# Patient Record
Sex: Female | Born: 1940 | Race: Black or African American | Hispanic: No | Smoking: Never smoker
Health system: Southern US, Community
[De-identification: ages and names within clinical notes are randomized; demographics above are authoritative.]

## PROBLEM LIST (undated history)

## (undated) DIAGNOSIS — R0600 Dyspnea, unspecified: Secondary | ICD-10-CM

## (undated) DIAGNOSIS — B351 Tinea unguium: Secondary | ICD-10-CM

## (undated) DIAGNOSIS — M1990 Unspecified osteoarthritis, unspecified site: Secondary | ICD-10-CM

## (undated) DIAGNOSIS — I10 Essential (primary) hypertension: Secondary | ICD-10-CM

## (undated) DIAGNOSIS — E559 Vitamin D deficiency, unspecified: Secondary | ICD-10-CM

## (undated) DIAGNOSIS — M898X9 Other specified disorders of bone, unspecified site: Secondary | ICD-10-CM

## (undated) DIAGNOSIS — E669 Obesity, unspecified: Secondary | ICD-10-CM

## (undated) DIAGNOSIS — E119 Type 2 diabetes mellitus without complications: Secondary | ICD-10-CM

## (undated) DIAGNOSIS — E7800 Pure hypercholesterolemia, unspecified: Secondary | ICD-10-CM

## (undated) DIAGNOSIS — E785 Hyperlipidemia, unspecified: Secondary | ICD-10-CM

## (undated) DIAGNOSIS — S82891A Other fracture of right lower leg, initial encounter for closed fracture: Secondary | ICD-10-CM

## (undated) DIAGNOSIS — M1710 Unilateral primary osteoarthritis, unspecified knee: Secondary | ICD-10-CM

## (undated) DIAGNOSIS — M2040 Other hammer toe(s) (acquired), unspecified foot: Secondary | ICD-10-CM

## (undated) DIAGNOSIS — IMO0002 Reserved for concepts with insufficient information to code with codable children: Secondary | ICD-10-CM

## (undated) DIAGNOSIS — Z972 Presence of dental prosthetic device (complete) (partial): Secondary | ICD-10-CM

## (undated) DIAGNOSIS — I209 Angina pectoris, unspecified: Secondary | ICD-10-CM

## (undated) DIAGNOSIS — I509 Heart failure, unspecified: Secondary | ICD-10-CM

## (undated) DIAGNOSIS — N189 Chronic kidney disease, unspecified: Secondary | ICD-10-CM

## (undated) DIAGNOSIS — R7303 Prediabetes: Secondary | ICD-10-CM

## (undated) DIAGNOSIS — K649 Unspecified hemorrhoids: Secondary | ICD-10-CM

## (undated) DIAGNOSIS — K219 Gastro-esophageal reflux disease without esophagitis: Secondary | ICD-10-CM

## (undated) DIAGNOSIS — C801 Malignant (primary) neoplasm, unspecified: Secondary | ICD-10-CM

## (undated) DIAGNOSIS — I890 Lymphedema, not elsewhere classified: Secondary | ICD-10-CM

## (undated) HISTORY — DX: Other hammer toe(s) (acquired), unspecified foot: M20.40

## (undated) HISTORY — DX: Reserved for concepts with insufficient information to code with codable children: IMO0002

## (undated) HISTORY — PX: ENDOMETRIAL BIOPSY: SHX622

## (undated) HISTORY — DX: Other fracture of right lower leg, initial encounter for closed fracture: S82.891A

## (undated) HISTORY — DX: Unilateral primary osteoarthritis, unspecified knee: M17.10

## (undated) HISTORY — DX: Essential (primary) hypertension: I10

## (undated) HISTORY — PX: JOINT REPLACEMENT: SHX530

## (undated) HISTORY — DX: Pure hypercholesterolemia, unspecified: E78.00

## (undated) HISTORY — DX: Vitamin D deficiency, unspecified: E55.9

## (undated) HISTORY — DX: Hyperlipidemia, unspecified: E78.5

## (undated) HISTORY — DX: Type 2 diabetes mellitus without complications: E11.9

## (undated) HISTORY — PX: FOOT SURGERY: SHX648

## (undated) HISTORY — DX: Tinea unguium: B35.1

## (undated) HISTORY — DX: Obesity, unspecified: E66.9

## (undated) HISTORY — DX: Unspecified osteoarthritis, unspecified site: M19.90

## (undated) HISTORY — DX: Angina pectoris, unspecified: I20.9

## (undated) HISTORY — PX: BUNIONECTOMY: SHX129

## (undated) HISTORY — DX: Other specified disorders of bone, unspecified site: M89.8X9

## (undated) HISTORY — PX: EYE SURGERY: SHX253

## (undated) HISTORY — PX: TONSILLECTOMY: SUR1361

## (undated) HISTORY — PX: COLONOSCOPY: SHX174

## (undated) HISTORY — PX: BREAST LUMPECTOMY: SHX2

---

## 1988-07-01 DIAGNOSIS — C50919 Malignant neoplasm of unspecified site of unspecified female breast: Secondary | ICD-10-CM

## 1988-07-01 HISTORY — PX: BREAST SURGERY: SHX581

## 1988-07-01 HISTORY — DX: Malignant neoplasm of unspecified site of unspecified female breast: C50.919

## 1993-07-01 HISTORY — PX: BREAST LUMPECTOMY: SHX2

## 2000-02-07 ENCOUNTER — Ambulatory Visit (HOSPITAL_COMMUNITY): Admission: RE | Admit: 2000-02-07 | Discharge: 2000-02-07 | Payer: Self-pay | Admitting: *Deleted

## 2005-08-05 ENCOUNTER — Ambulatory Visit: Payer: Self-pay | Admitting: Obstetrics and Gynecology

## 2005-08-13 ENCOUNTER — Ambulatory Visit: Payer: Self-pay | Admitting: Unknown Physician Specialty

## 2006-02-12 ENCOUNTER — Ambulatory Visit: Payer: Self-pay | Admitting: Surgery

## 2006-07-04 ENCOUNTER — Ambulatory Visit: Payer: Self-pay | Admitting: Gastroenterology

## 2006-08-15 ENCOUNTER — Ambulatory Visit: Payer: Self-pay | Admitting: Unknown Physician Specialty

## 2007-02-27 ENCOUNTER — Ambulatory Visit: Payer: Self-pay | Admitting: Unknown Physician Specialty

## 2007-03-06 ENCOUNTER — Ambulatory Visit: Payer: Self-pay | Admitting: Unknown Physician Specialty

## 2007-08-17 ENCOUNTER — Ambulatory Visit: Payer: Self-pay | Admitting: Unknown Physician Specialty

## 2008-06-15 ENCOUNTER — Ambulatory Visit: Payer: Self-pay | Admitting: Internal Medicine

## 2008-07-01 DIAGNOSIS — S82891A Other fracture of right lower leg, initial encounter for closed fracture: Secondary | ICD-10-CM

## 2008-07-01 HISTORY — DX: Other fracture of right lower leg, initial encounter for closed fracture: S82.891A

## 2008-08-17 ENCOUNTER — Ambulatory Visit: Payer: Self-pay | Admitting: Unknown Physician Specialty

## 2008-12-20 ENCOUNTER — Ambulatory Visit: Payer: Self-pay | Admitting: Unknown Physician Specialty

## 2009-09-01 ENCOUNTER — Ambulatory Visit: Payer: Self-pay | Admitting: Unknown Physician Specialty

## 2010-09-04 ENCOUNTER — Ambulatory Visit: Payer: Self-pay | Admitting: Unknown Physician Specialty

## 2011-10-03 ENCOUNTER — Ambulatory Visit: Payer: Self-pay | Admitting: Unknown Physician Specialty

## 2011-10-23 ENCOUNTER — Ambulatory Visit: Payer: Self-pay | Admitting: Gastroenterology

## 2012-09-17 ENCOUNTER — Ambulatory Visit: Payer: Self-pay | Admitting: Unknown Physician Specialty

## 2012-10-07 ENCOUNTER — Ambulatory Visit: Payer: Self-pay | Admitting: Unknown Physician Specialty

## 2013-12-09 DIAGNOSIS — E559 Vitamin D deficiency, unspecified: Secondary | ICD-10-CM | POA: Insufficient documentation

## 2013-12-12 DIAGNOSIS — M1990 Unspecified osteoarthritis, unspecified site: Secondary | ICD-10-CM | POA: Insufficient documentation

## 2014-08-25 ENCOUNTER — Ambulatory Visit: Payer: Self-pay | Admitting: Internal Medicine

## 2015-02-01 ENCOUNTER — Other Ambulatory Visit: Payer: Self-pay | Admitting: Internal Medicine

## 2015-02-01 DIAGNOSIS — R32 Unspecified urinary incontinence: Secondary | ICD-10-CM | POA: Insufficient documentation

## 2015-02-01 DIAGNOSIS — Z1239 Encounter for other screening for malignant neoplasm of breast: Secondary | ICD-10-CM

## 2015-02-04 DIAGNOSIS — E119 Type 2 diabetes mellitus without complications: Secondary | ICD-10-CM | POA: Insufficient documentation

## 2015-02-04 DIAGNOSIS — N183 Chronic kidney disease, stage 3 unspecified: Secondary | ICD-10-CM | POA: Insufficient documentation

## 2015-02-04 DIAGNOSIS — Z1239 Encounter for other screening for malignant neoplasm of breast: Secondary | ICD-10-CM | POA: Insufficient documentation

## 2015-05-01 DIAGNOSIS — M159 Polyosteoarthritis, unspecified: Secondary | ICD-10-CM | POA: Insufficient documentation

## 2015-05-01 DIAGNOSIS — K219 Gastro-esophageal reflux disease without esophagitis: Secondary | ICD-10-CM | POA: Insufficient documentation

## 2015-12-09 DIAGNOSIS — R0609 Other forms of dyspnea: Secondary | ICD-10-CM | POA: Insufficient documentation

## 2016-02-18 DIAGNOSIS — M25472 Effusion, left ankle: Secondary | ICD-10-CM | POA: Insufficient documentation

## 2016-02-18 DIAGNOSIS — K649 Unspecified hemorrhoids: Secondary | ICD-10-CM | POA: Insufficient documentation

## 2016-10-17 ENCOUNTER — Other Ambulatory Visit: Payer: Self-pay | Admitting: Internal Medicine

## 2016-10-17 DIAGNOSIS — N183 Chronic kidney disease, stage 3 unspecified: Secondary | ICD-10-CM

## 2016-10-28 ENCOUNTER — Ambulatory Visit
Admission: RE | Admit: 2016-10-28 | Discharge: 2016-10-28 | Disposition: A | Discharge status: Home / Self Care | Payer: Medicare Other | Source: Ambulatory Visit | Attending: Internal Medicine | Admitting: Internal Medicine

## 2016-10-28 DIAGNOSIS — N261 Atrophy of kidney (terminal): Secondary | ICD-10-CM | POA: Diagnosis not present

## 2016-10-28 DIAGNOSIS — N183 Chronic kidney disease, stage 3 unspecified: Secondary | ICD-10-CM

## 2016-10-31 DIAGNOSIS — I5032 Chronic diastolic (congestive) heart failure: Secondary | ICD-10-CM | POA: Insufficient documentation

## 2017-01-20 ENCOUNTER — Encounter: Payer: Self-pay | Admitting: Occupational Therapy

## 2017-01-20 ENCOUNTER — Ambulatory Visit: Payer: Medicare Other | Attending: Cardiology | Admitting: Occupational Therapy

## 2017-01-20 DIAGNOSIS — I890 Lymphedema, not elsewhere classified: Secondary | ICD-10-CM | POA: Insufficient documentation

## 2017-01-20 NOTE — Patient Instructions (Signed)

## 2017-01-21 NOTE — Therapy (Signed)
[place] MAIN [facility] SERVICES [address] [place], [place], [address] Phone: [phone]   Fax:  [phone]  Occupational Therapy Evaluation  Patient Details  Name: [name] MRN: [id number] Date of Birth: 05/14/1941 Referring Provider: Isaias Cowman, MD  Encounter Date: 01/20/2017      OT End of Session - 01/20/17 1654    Visit Number 1   Number of Visits 36   Date for OT Re-Evaluation 04/20/17   OT Start Time 1107   OT Stop Time 1215   OT Time Calculation (min) 68 min      History reviewed. No pertinent past medical history.  History reviewed. No pertinent surgical history.  There were no vitals filed for this visit.      Subjective Assessment - 01/21/17 1451    Subjective  Pt is a  pleasant, 76 yo female referred to Occupational Therapy by Isaias Cowman, MD for lymphedema (LE) evaluation and treatment. Pt reports exacerbation of chronic, BLE leg swelling after a fall 3 weeks ago resulting in  trauma to her L knee. Pt reports sudden onset of swelling in approximately April of this year. Upon assessment , however, swelling appears to be longstanding. Pt denies prior LE therepay and does not currently wear elastic compression stockings. Pt endorses a hx of open, weeping leg wounds periodically. She denies hx of cellulitis.   Pertinent History HTN, diet controlled type 2 diabetes, stage III, chronic kidney disease, hx urinary incontinence, GERD, OA ( s/p intra articular injections)   Limitations chronic leag pain and swelling, difficulty walking, impaired functional mobility and transfers, difficulty fitting street shoes and LB clothing, unable to reach feet to inspect skin, perform skin and nail care, and to isnspect skin; decreased activity tolerance, including IADLs and social activities, requiring standing, walking, and / or prolonged sitting, fall risk, increased infection risk ( cellulitis)   Patient Stated Goals  reduce swelling  so I can move better and do more   Currently in Pain? Yes   Pain Score 7    Pain Location Leg   Pain Orientation Right;Left   Pain Descriptors / Indicators Aching;Tender;Throbbing;Heaviness;Tightness;Tiring;Sore;Discomfort;Dull   Pain Type Chronic pain   Pain Frequency Intermittent   Aggravating Factors  standing, walking, prolonged sitting, weight bearing, transfers, bed mobility   Pain Relieving Factors elevation. compression rubbing           [facility] OT Assessment - 01/21/17 0001      Assessment   Diagnosis BLE, mild, stage 2 secondary lymphedema exacerbated by systemic fluid retention, (CKD and HTN) dependent positioning, orthopedic trauma ( falls) and chronic joint inflamation ( OA knees)    Referring Provider Isaias Cowman, MD   Onset Date 09/29/16   Prior Therapy no prior CDT; no compression garments     Precautions   Precautions Other (comment)  LE skin precautions     Balance Screen   Has the patient fallen in the past 6 months Yes   How many times? unknown     Home  Environment   Family/patient expects to be discharged to: Private residence   Living Arrangements Alone   Available Help at Discharge Available PRN/intermittently   Type of Home Other (Comment)  older home.2 steps to enter,no handrails grab bars tub seat     Prior Function   Level of Independence Independent   Vocation --  part time Scientist, research (physical sciences)   Vocation Requirements long distance driving [employer] [place]   Leisure  church     Activity Tolerance   Activity Tolerance Comments activity tolerance not limited by endurance. AT limited increased leg pain and swelling w/ prolonged sitting, standing and walking     Cognition   Behaviors Other (comment)  flat affect. holds head in hands intermittently thruout eval     ROM / Strength   AROM / PROM / Strength AROM;Strength  WFL                  OT Treatments/Exercises (OP) - 01/21/17 0001       Transfers   Transfers Sit to Stand;Stand to Sit   Sit to Stand With upper extremity assist;With armrests;6: Modified independent (Device/Increase time)   Stand to Sit 7: Independent     ADLs   Overall ADLs see Limitations  in SUBJECTIVE. BLE swelling and associated pain limiits ability to fit shoes and LB clothing.  difficulty reaching limits ability to  bathing feet and distal legs, to inspect skin, to perform skin and nail care. All basic and instrumental ADLs requiring prolonged standing, walking or sitting are impaired by BLE swelling and pain.    Functional Mobility transfers limited by BLE LE   ADL Education Given Yes     Manual Therapy   Manual Therapy Edema management   Edema Management Comparative BLE limb volumetrics TBA at initial CDT Rx visit. By viisual assessment estimate 15% limb volume differential below the knees, L>R       Skin Description Hyper-Keratosis [place] Shiny Tight Fibrotic Fatty Doughy    distended pores L>L  x x X above knees X above knees   Hydration Dry Flaky Erythema Other   x x     Color Redness Present Pallor Blanching Hemosiderin Staining Other   x    Darkened skin distal legs L>R   Odor Malodorous Yeast Present Absent      x   Temperature Warm Cool Normal     x   Pitting Edema 1+ 2+ 3+ 4+ >4       x   Girth Symmetrical Asymmetrical Other Distribution    L>R Pronounced swelling and palpable warmth at knee joints   Stemmer Sign Positive Negative   x    Lymphorrea History Of: (When) Present Absent    X L anterior distal leg    Wounds History Of (when) Present Absent Venous Arterial Pressure Size   S/p fall 3 weeks ago- L anterior distal leg Multiple LLE bruises on knee and distally        Signs of Infection Redness Warmth Erythema Acute Swelling Drainage          Scars Fibrosis Hypersensitivity   Fatty fibrosis B medial and lateral  malleoli     Sensation Light Touch Deep pressure Hypersensitivty   Present Absent Present  Absent Present Absent   TBA         Nails WNL Fungus Present Other    x    Hair Growth Symmetrical Asymmetrical   x              OT Education - 01/20/17 1653    Education provided Yes   Education Details Provided Pt/caregiver skilled education and ADL training throughout visit for lymphedema etiology, progression, and treatment including Intensive and Management Phase Complete Decongestive Therapy (CDT)  Discussed lymphedema precautions, cellulitis risk, and all CDT and LE self-care components, including compression wrapping/ garments & devices, lymphatic pumping ther ex, simple self-MLD, and skin care. Provided printed Lymphedema Workbook  for reference.   Person(s) Educated Patient   Methods Explanation;Demonstration;Verbal cues   Comprehension Verbalized understanding;Returned demonstration;Verbal cues required;Tactile cues required             OT Long Term Goals - 01/21/17 1721      OT LONG TERM GOAL #1   Title Pt independent w/ lymphedema precautions and prevention principals and strategies to limit LE progression and infection risk.   Baseline dependent   Time 2   Period Weeks   Status New   Target Date --  2 weeks s/p initial Rx visit     OT LONG TERM GOAL #2   Title Lymphedema (LE) management/ self-care: Pt able to apply knee length, multi layered, gradient compression wraps with modified independence ( extra time and modified positioning)  using proper techniques within 2 weeks to achieve optimal limb volume reductions bilaterally.   Baseline dependent   Time 2   Period Weeks   Status New   Target Date --  2 weeks s/p initial Rx visit     OT LONG TERM GOAL #3   Title Lymphedema (LE) management/ self-care:  Pt to achieve at least 10% limb volume reduction  on L below the knee and 5 % reduction on R below the knee during Intensive Phase CDT to limit progression, to reduce leg pain, to improve ADLs performance, and to facilitate safe functional mobility and  ambulation.   Baseline dependent   Time 12   Period Weeks   Status New   Target Date 04/20/17     OT LONG TERM GOAL #4   Title LE management/ self care: Pt able to don and doff appropriate  BLE compression garments using assistive devices within 1 week of issue date with modified independence.   Baseline dependent   Time 12   Period Weeks   Status New   Target Date 04/20/17     OT LONG TERM GOAL #5   Title Lymphedema (LE) management/ self-care: During Management Phase CDT Pt to sremain infection free and ustain current limb volumes within 5%, and all other clinical gains achieved during OT treatment with needed level of caregiver assistance to limit LE progression, infection risk and further functional decline.   Baseline dependent   Time 6   Period Months   Status New   Target Date 07/19/17               Plan - 01/21/17 1731    Occupational performance deficits (Please refer to evaluation for details): ADL's;IADL's;Rest and Sleep;Work;Leisure;Social Participation;Other  body image   Rehab Potential Good   OT Frequency 3x / week   OT Duration 12 weeks   OT Treatment/Interventions Self-care/ADL training;Therapeutic exercise;Patient/family education;Manual Therapy;Manual lymph drainage;Other (comment);DME and/or AE instruction;Compression bandaging;Therapeutic activities  skin care w/ low ph castor oil and/ or Eucerin lotion   Clinical Decision Making Several treatment options, min-mod task modification necessary   Recommended Other Services Fit with appropriate BLE, knee length compression garments or alternative, adjustable daytime wrap garments for ease of donning and doffing. Fit w/ BLE, low profile, HOS devices to improve lymphatic circulation and decrease fibrosis formation during HOS   Consulted and Agree with Plan of Care Patient      Patient will benefit from skilled therapeutic intervention in order to improve the following deficits and impairments:  Abnormal  gait, Decreased skin integrity, Decreased knowledge of precautions, Decreased activity tolerance, Decreased knowledge of use of DME, Impaired flexibility, Decreased balance, Decreased mobility, Difficulty walking, Increased edema,  Pain  Visit Diagnosis: Lymphedema, not elsewhere classified - Plan: Ot plan of care cert/re-cert      G-Codes - 00/71/21 1655    Functional Assessment Tool Used (Outpatient only) Observation, physical examination, interview, medical chart review, compalitve limb volumetrics   Functional Limitation Self care   Self Care Current Status (F7588) At least 80 percent but less than 100 percent impaired, limited or restricted   Self Care Goal Status (T2549) At least 1 percent but less than 20 percent impaired, limited or restricted      Problem List There are no active problems to display for this patient.   Ansel Bong 01/21/2017, 6:00 PM  [place] MAIN [facility] SERVICES [address] [place], [place], [address] Phone: [phone]   Fax:  [phone]  Name: [name] MRN: [id number] Date of Birth: 1941-04-18

## 2017-01-29 ENCOUNTER — Ambulatory Visit
Admission: RE | Admit: 2017-01-29 | Discharge: 2017-01-29 | Disposition: A | Discharge status: Home / Self Care | Payer: Medicare Other | Source: Ambulatory Visit | Attending: Internal Medicine | Admitting: Internal Medicine

## 2017-01-29 ENCOUNTER — Other Ambulatory Visit: Payer: Self-pay | Admitting: Internal Medicine

## 2017-01-29 DIAGNOSIS — M7989 Other specified soft tissue disorders: Secondary | ICD-10-CM | POA: Diagnosis not present

## 2017-02-11 ENCOUNTER — Ambulatory Visit: Payer: Medicare Other | Attending: Cardiology | Admitting: Occupational Therapy

## 2017-02-11 DIAGNOSIS — I890 Lymphedema, not elsewhere classified: Secondary | ICD-10-CM

## 2017-02-11 NOTE — Therapy (Signed)
[place] MAIN [facility] SERVICES [address] [place], [place], [address] Phone: [phone]   Fax:  [phone]  Occupational Therapy Treatment  Patient Details  Name: [name] MRN: [id number] Date of Birth: 1940-11-26 Referring Provider: Isaias Cowman, MD  Encounter Date: 02/11/2017      OT End of Session - 02/11/17 1726    Visit Number 2   Number of Visits 36   Date for OT Re-Evaluation 04/20/17   OT Start Time 0235   OT Stop Time 0335   OT Time Calculation (min) 60 min   Activity Tolerance Patient tolerated treatment well;Other (comment);Patient limited by pain  limited by decreased bed mobility, difficulty walking, and impaired transfers.   Behavior During Therapy [facility] for tasks assessed/performed      No past medical history on file.  No past surgical history on file.  There were no vitals filed for this visit.      Subjective Assessment - 02/11/17 1716    Subjective  Pt presents for OT visit 2/36 to address BLE Lymphedema, L>R. Pt brings new lace up shoes to session as requested.    Pertinent History HTN, diet controlled type 2 diabetes, stage III, chronic kidney disease, hx urinary incontinence, GERD, OA ( s/p intra articular injections)   Limitations chronic leag pain and swelling, difficulty walking, impaired functional mobility and transfers, difficulty fitting street shoes and LB clothing, unable to reach feet to inspect skin, perform skin and nail care, and to isnspect skin; decreased activity tolerance, including IADLs and social activities, requiring standing, walking, and / or prolonged sitting, fall risk, increased infection risk ( cellulitis)   Patient Stated Goals reduce swelling  so I can move better and do more   Currently in Pain? Yes  Leg pain unchanged since IE. not rated numerically today             LYMPHEDEMA/ONCOLOGY QUESTIONNAIRE - 02/11/17 1720      Lymphedema Assessments   Lymphedema  Assessments Lower extremities     Right Lower Extremity Lymphedema   Other RLE belw knee volume (A-D) = 4484.22 ml. Knee to groin (E-G) limb volume = 6966.07 %. Ankle to grouin, or full leg (A-G) volume = 11450.29 ml     Left Lower Extremity Lymphedema   Other LLE belw knee volume (A-D) = 5713.55 ml. Knee to groin (E-G) limb volume = 6865.03%. Ankle to grouin, or full leg (A-G) volume = 12578.59 ml   Other Limb volume differential at the A-D segement measures 21.52% , L>R. LVD at thigh segemnt ( E-G) measures -1.47%, R>L. LVD  for full leg (A-G) measures 8.97%, L>R.                 OT Treatments/Exercises (OP) - 02/11/17 0001      ADLs   ADL Education Given Yes     Manual Therapy   Manual Therapy Edema management;Compression Bandaging   Manual therapy comments completed BLE comparative limb volumetrics for baseline today   Compression Bandaging Ankle to below Knee (A-D)  LLE gradient compression wraps applied from foot to below knee as follows: Toe wrap omitted as not indicated. . Knee length cotton stockinett from base of toes to knee. One roll Rasidol soft foam. cumferentially from foot to tibila tuberosity w/ ~ 50% overlap; single 8 cm x 5 m  to foot and ankle, one 12 cm  x 5 m ankle to achilles origin...all applied circumferentially in custommary layered gradient configuration. Fitted w/  3 x non-slip sock as Pt able to fit shoe over wraps.                 OT Education - 02/11/17 1718    Education provided Yes   Education Details Manual lymph drainage (MLD) to LLE in supine utilizing functional inguinal lymph nodes and deep abdominal lymphatics as is customary for non-cancer related lower extremity LE, including bilateral "short neck" sequence, J strokes to sub and supraclavicular LN, deep abdominal pathways, functional inguinal LN, lower extremity proximal to distal w/ emphasis on medial knee bottleneck and politeal LN. Performed fibrosis technique to B maleoli and  distal  legs to address fatty fibrosis.   Person(s) Educated Patient   Methods Explanation;Demonstration   Comprehension Verbalized understanding;Need further instruction             OT Long Term Goals - 01/21/17 1721      OT LONG TERM GOAL #1   Title Pt independent w/ lymphedema precautions and prevention principals and strategies to limit LE progression and infection risk.   Baseline dependent   Time 2   Period Weeks   Status New   Target Date --  2 weeks s/p initial Rx visit     OT LONG TERM GOAL #2   Title Lymphedema (LE) management/ self-care: Pt able to apply knee length, multi layered, gradient compression wraps with modified independence ( extra time and modified positioning)  using proper techniques within 2 weeks to achieve optimal limb volume reductions bilaterally.   Baseline dependent   Time 2   Period Weeks   Status New   Target Date --  2 weeks s/p initial Rx visit     OT LONG TERM GOAL #3   Title Lymphedema (LE) management/ self-care:  Pt to achieve at least 10% limb volume reduction  on L below the knee and 5 % reduction on R below the knee during Intensive Phase CDT to limit progression, to reduce leg pain, to improve ADLs performance, and to facilitate safe functional mobility and ambulation.   Baseline dependent   Time 12   Period Weeks   Status New   Target Date 04/20/17     OT LONG TERM GOAL #4   Title LE management/ self care: Pt able to don and doff appropriate  BLE compression garments using assistive devices within 1 week of issue date with modified independence.   Baseline dependent   Time 12   Period Weeks   Status New   Target Date 04/20/17     OT LONG TERM GOAL #5   Title Lymphedema (LE) management/ self-care: During Management Phase CDT Pt to sremain infection free and ustain current limb volumes within 5%, and all other clinical gains achieved during OT treatment with needed level of caregiver assistance to limit LE progression,  infection risk and further functional decline.   Baseline dependent   Time 6   Period Months   Status New   Target Date 07/19/17               Plan - 02/11/17 1728    Clinical Impression Statement BLE comparative limb volumetrics taken for starting baseline reveal Limb volume differential at the A-D ( below the knee ) segements measures 21.52% , L>R. LVD at thigh segemnt ( E-G) measures -1.47%, R>L. LVD  for full leg (A-G) measures 8.97%, L>R.. Thus, these values demonstrate distribution of swelling is primarily below the knee on the L. The LVD at the thigh is most  likely 2/2 dR side dominance.  Pt  tolerated compression wrapping from toes to knee and was ableto don shoe after session using long handled shoe horn.  Pt educated for Lymphatic pumping ther ex, but did not return demonstration. Cont as per POC.   Occupational performance deficits (Please refer to evaluation for details): ADL's;IADL's;Rest and Sleep;Work;Play;Leisure;Social Participation   Rehab Potential Good   OT Frequency 3x / week   OT Duration 12 weeks   OT Treatment/Interventions Self-care/ADL training;Therapeutic exercise;Patient/family education;Manual Therapy;Manual lymph drainage;Other (comment);DME and/or AE instruction;Compression bandaging;Therapeutic activities  skin care w/ low ph castor oil and/ or Eucerin lotion   Consulted and Agree with Plan of Care Patient      Patient will benefit from skilled therapeutic intervention in order to improve the following deficits and impairments:  Abnormal gait, Decreased skin integrity, Decreased knowledge of precautions, Decreased activity tolerance, Decreased knowledge of use of DME, Impaired flexibility, Decreased balance, Decreased mobility, Difficulty walking, Increased edema, Pain  Visit Diagnosis: Lymphedema, not elsewhere classified    Problem List There are no active problems to display for this patient.   Andrey Spearman, MS, OTR/L, [facility] 02/11/17  5:35 PM  [place] MAIN [facility] SERVICES [address] [place], [place], [address] Phone: [phone]   Fax:  [phone]  Name: LORRY FURBER MRN: [id number] Date of Birth: [birth year]

## 2017-02-13 ENCOUNTER — Ambulatory Visit: Payer: Medicare Other | Admitting: Occupational Therapy

## 2017-02-17 ENCOUNTER — Ambulatory Visit: Payer: Medicare Other | Admitting: Occupational Therapy

## 2017-02-25 ENCOUNTER — Ambulatory Visit: Payer: Medicare Other | Admitting: Occupational Therapy

## 2017-02-25 DIAGNOSIS — I890 Lymphedema, not elsewhere classified: Secondary | ICD-10-CM | POA: Diagnosis not present

## 2017-02-25 NOTE — Therapy (Signed)
[place] MAIN [facility] SERVICES [address] [place], [place], [address] Phone: [phone]   Fax:  [phone]  Occupational Therapy Treatment  Patient Details  Name: [name] MRN: [id number] Date of Birth: 1941-01-30 Referring Provider: Isaias Cowman, MD  Encounter Date: 02/25/2017      OT End of Session - 02/25/17 1703    Visit Number 3   Number of Visits 36   Date for OT Re-Evaluation 04/20/17   OT Start Time 0304   OT Stop Time 0405   OT Time Calculation (min) 61 min   Activity Tolerance Patient tolerated treatment well;Other (comment);Patient limited by pain  limited by decreased bed mobility, difficulty walking, and impaired transfers.   Behavior During Therapy [facility] for tasks assessed/performed      No past medical history on file.  No past surgical history on file.  There were no vitals filed for this visit.      Subjective Assessment - 02/25/17 1655    Subjective  Pt presents for OT visit 3/36 to address BLE Lymphedema, L>R. Pt was last seen on 7/23 to commence CDT, but has missed last 2 appointments due to time confusion and tardiness. Pt verbalized understanding today that without consistent and regular attendane at therapy sessions prognosis for progress towards goals is poor. Pt states that she tryed applying compression wraps to LLE onne time since last visit, and she had friends apply them as well.    Pertinent History HTN, diet controlled type 2 diabetes, stage III, chronic kidney disease, hx urinary incontinence, GERD, OA ( s/p intra articular injections)   Limitations chronic leag pain and swelling, difficulty walking, impaired functional mobility and transfers, difficulty fitting street shoes and LB clothing, unable to reach feet to inspect skin, perform skin and nail care, and to isnspect skin; decreased activity tolerance, including IADLs and social activities, requiring standing, walking, and / or prolonged  sitting, fall risk, increased infection risk ( cellulitis)   Patient Stated Goals reduce swelling  so I can move better and do more   Currently in Pain? No/denies                      OT Treatments/Exercises (OP) - 02/25/17 0001      ADLs   ADL Education Given Yes     Manual Therapy   Manual Therapy Edema management;Compression Bandaging;Manual Lymphatic Drainage (MLD)   Manual Lymphatic Drainage (MLD) Manual lymph drainage (MLD) to LLE in supine utilizing functional inguinal lymph nodes and deep abdominal lymphatics as is customary for non-cancer related lower extremity LE, including bilateral "short neck" sequence, J strokes to sub and supraclavicular LN, deep abdominal pathways, functional inguinal LN, lower extremity proximal to distal w/ emphasis on medial knee bottleneck and politeal LN. Performed fibrosis technique to B maleoli and distal  legs to address fatty fibrosis.   Compression Bandaging Ankle to below Knee (A-D)  LLE gradient compression wraps applied from foot to below knee as follows: Toe wrap omitted as not indicated. . Knee length cotton stockinett from base of toes to knee. One roll Rasidol soft foam. cumferentially from foot to tibila tuberosity w/ ~ 50% overlap; single 8 cm x 5 m  to foot and ankle, one 12 cm  x 5 m ankle to achilles origin...all applied circumferentially in custommary layered gradient configuration. Fitted w/ 3 x non-slip sock as Pt able to fit shoe over wraps.  OT Education - 02/25/17 1657    Education provided Yes   Education Details Continued skilled Pt/caregiver education  And LE ADL training throughout visit for lymphedema self care/ home program, including compression wrapping, compression garment and device wear/care, lymphatic pumping ther ex, simple self-MLD, and skin care. Discussed progress towards goals.   Person(s) Educated Patient   Methods Explanation;Demonstration   Comprehension Verbalized  understanding             OT Long Term Goals - 01/21/17 1721      OT LONG TERM GOAL #1   Title Pt independent w/ lymphedema precautions and prevention principals and strategies to limit LE progression and infection risk.   Baseline dependent   Time 2   Period Weeks   Status New   Target Date --  2 weeks s/p initial Rx visit     OT LONG TERM GOAL #2   Title Lymphedema (LE) management/ self-care: Pt able to apply knee length, multi layered, gradient compression wraps with modified independence ( extra time and modified positioning)  using proper techniques within 2 weeks to achieve optimal limb volume reductions bilaterally.   Baseline dependent   Time 2   Period Weeks   Status New   Target Date --  2 weeks s/p initial Rx visit     OT LONG TERM GOAL #3   Title Lymphedema (LE) management/ self-care:  Pt to achieve at least 10% limb volume reduction  on L below the knee and 5 % reduction on R below the knee during Intensive Phase CDT to limit progression, to reduce leg pain, to improve ADLs performance, and to facilitate safe functional mobility and ambulation.   Baseline dependent   Time 12   Period Weeks   Status New   Target Date 04/20/17     OT LONG TERM GOAL #4   Title LE management/ self care: Pt able to don and doff appropriate  BLE compression garments using assistive devices within 1 week of issue date with modified independence.   Baseline dependent   Time 12   Period Weeks   Status New   Target Date 04/20/17     OT LONG TERM GOAL #5   Title Lymphedema (LE) management/ self-care: During Management Phase CDT Pt to sremain infection free and ustain current limb volumes within 5%, and all other clinical gains achieved during OT treatment with needed level of caregiver assistance to limit LE progression, infection risk and further functional decline.   Baseline dependent   Time 6   Period Months   Status New   Target Date 07/19/17               Plan -  02/25/17 1700    Clinical Impression Statement Condition of R and L legs are unchanged today regarding limb volume and skin condition since last visit on 7/23. Pt attempted to apply wraps one since last seen, and friend applied them 1 x. Pt has missed last 2 of 4 vists so progress towards goals is nil. Encouraged Pt to attend  OT at scheeduled frequency for optimal outcome for LE management.   Occupational performance deficits (Please refer to evaluation for details): ADL's;IADL's;Rest and Sleep;Work;Leisure;Social Participation   Rehab Potential Good   OT Frequency 3x / week   OT Duration 12 weeks   OT Treatment/Interventions Self-care/ADL training;Therapeutic exercise;Patient/family education;Manual Therapy;Manual lymph drainage;Other (comment);DME and/or AE instruction;Compression bandaging;Therapeutic activities  skin care w/ low ph castor oil and/ or Eucerin lotion   Consulted  and Agree with Plan of Care Patient      Patient will benefit from skilled therapeutic intervention in order to improve the following deficits and impairments:  Abnormal gait, Decreased skin integrity, Decreased knowledge of precautions, Decreased activity tolerance, Decreased knowledge of use of DME, Impaired flexibility, Decreased balance, Decreased mobility, Difficulty walking, Increased edema, Pain  Visit Diagnosis: Lymphedema, not elsewhere classified    Problem List There are no active problems to display for this patient.  Andrey Spearman, MS, OTR/L, [facility] 02/25/17 5:04 PM   [place] MAIN [facility] SERVICES [address] [place], [place], [address] Phone: [phone]   Fax:  [phone]  Name: [name] MRN: [id number] Date of Birth: 1940-11-20

## 2017-02-27 ENCOUNTER — Ambulatory Visit: Payer: Medicare Other | Admitting: Occupational Therapy

## 2017-02-27 DIAGNOSIS — I890 Lymphedema, not elsewhere classified: Secondary | ICD-10-CM

## 2017-02-27 NOTE — Therapy (Signed)
[place] MAIN [facility] SERVICES [address] [place], [place], [address] Phone: [phone]   Fax:  [phone]  Occupational Therapy Treatment  Patient Details  Name: [name] MRN: [id number] Date of Birth: [birth year] Referring Provider: Isaias Cowman, MD  Encounter Date: 02/27/2017      OT End of Session - 02/27/17 1638    Visit Number 4   Number of Visits 36   Date for OT Re-Evaluation 04/20/17   OT Start Time 0205   OT Stop Time 0305   OT Time Calculation (min) 60 min   Activity Tolerance Patient tolerated treatment well;Other (comment);No increased pain  limited by decreased bed mobility, difficulty walking, and impaired transfers.   Behavior During Therapy [facility] for tasks assessed/performed      No past medical history on file.  No past surgical history on file.  There were no vitals filed for this visit.      Subjective Assessment - 02/27/17 1634    Subjective  Pt presents for OT visit 4/36 to address BLE Lymphedema, commencing with Intensive Pgase CDT to LLE first. Pt brings wraps to clinic in a bag. She reports she tolerated them without difficulty after last session after removing outermost wrap as they felt too tigght. She did not notice volume reduction with knee wrapped.   Pertinent History HTN, diet controlled type 2 diabetes, stage III, chronic kidney disease, hx urinary incontinence, GERD, OA ( s/p intra articular injections)   Limitations chronic leag pain and swelling, difficulty walking, impaired functional mobility and transfers, difficulty fitting street shoes and LB clothing, unable to reach feet to inspect skin, perform skin and nail care, and to isnspect skin; decreased activity tolerance, including IADLs and social activities, requiring standing, walking, and / or prolonged sitting, fall risk, increased infection risk ( cellulitis)   Patient Stated Goals reduce swelling  so I can move better and do more    Currently in Pain? No/denies                      OT Treatments/Exercises (OP) - 02/27/17 0001      ADLs   ADL Education Given Yes     Manual Therapy   Manual Therapy Edema management;Compression Bandaging;Manual Lymphatic Drainage (MLD)   Manual therapy comments skin care to LLE w/ low pH castor oil during MLD. Telfa  placed over openings on skin on anterior L Leg   Manual Lymphatic Drainage (MLD) Manual lymph drainage (MLD) to LLE in supine utilizing functional inguinal lymph nodes and deep abdominal lymphatics as is customary for non-cancer related lower extremity LE, including bilateral "short neck" sequence, J strokes to sub and supraclavicular LN, deep abdominal pathways, functional inguinal LN, lower extremity proximal to distal w/ emphasis on medial knee bottleneck and politeal LN. Performed fibrosis technique to B maleoli and distal  legs to address fatty fibrosis.   Compression Bandaging Ankle to below Knee (A-D)  LLE gradient compression wraps applied from foot to below knee as follows: Toe wrap omitted as not indicated. . Knee length cotton stockinett from base of toes to knee. One roll Rasidol soft foam. cumferentially from foot to tibila tuberosity w/ ~ 50% overlap; single 8 cm x 5 m  to foot and ankle, one 12 cm  x 5 m ankle to achilles origin...all applied circumferentially in custommary layered gradient configuration. Fitted w/ 3 x non-slip sock as Pt able to fit shoe over wraps.  OT Education - 02/27/17 1634    Education provided Yes   Education Details Continued skilled Pt/caregiver education  And LE ADL training throughout visit for lymphedema self care/ home program, including compression wrapping, compression garment and device wear/care, lymphatic pumping ther ex, simple self-MLD, and skin care. Discussed progress towards goals.   Person(s) Educated Patient   Methods Explanation;Demonstration   Comprehension Verbalized  understanding;Need further instruction             OT Long Term Goals - 01/21/17 1721      OT LONG TERM GOAL #1   Title Pt independent w/ lymphedema precautions and prevention principals and strategies to limit LE progression and infection risk.   Baseline dependent   Time 2   Period Weeks   Status New   Target Date --  2 weeks s/p initial Rx visit     OT LONG TERM GOAL #2   Title Lymphedema (LE) management/ self-care: Pt able to apply knee length, multi layered, gradient compression wraps with modified independence ( extra time and modified positioning)  using proper techniques within 2 weeks to achieve optimal limb volume reductions bilaterally.   Baseline dependent   Time 2   Period Weeks   Status New   Target Date --  2 weeks s/p initial Rx visit     OT LONG TERM GOAL #3   Title Lymphedema (LE) management/ self-care:  Pt to achieve at least 10% limb volume reduction  on L below the knee and 5 % reduction on R below the knee during Intensive Phase CDT to limit progression, to reduce leg pain, to improve ADLs performance, and to facilitate safe functional mobility and ambulation.   Baseline dependent   Time 12   Period Weeks   Status New   Target Date 04/20/17     OT LONG TERM GOAL #4   Title LE management/ self care: Pt able to don and doff appropriate  BLE compression garments using assistive devices within 1 week of issue date with modified independence.   Baseline dependent   Time 12   Period Weeks   Status New   Target Date 04/20/17     OT LONG TERM GOAL #5   Title Lymphedema (LE) management/ self-care: During Management Phase CDT Pt to sremain infection free and ustain current limb volumes within 5%, and all other clinical gains achieved during OT treatment with needed level of caregiver assistance to limit LE progression, infection risk and further functional decline.   Baseline dependent   Time 6   Period Months   Status New   Target Date 07/19/17                Plan - 02/27/17 1638    Clinical Impression Statement No visible or palpable change in limb volume below the knee for LLE today, although L knee does appear less swollen since last visit. Pt is building tolerance to compression wraps, but thus far she has not be reapplying them during visit intervals. Stressed the importance of this for progress towards goals again today. Pt tolerated LLE MLD, skin care and knee lenth compression wrapping without difficulty in clinic. Lymphorrhea is absent today. Cont as per POC.   Occupational performance deficits (Please refer to evaluation for details): ADL's;IADL's;Rest and Sleep;Work;Leisure;Social Participation;Other  nody image   Rehab Potential Good   OT Frequency 3x / week   OT Duration 12 weeks   OT Treatment/Interventions Self-care/ADL training;Therapeutic exercise;Patient/family education;Manual Therapy;Manual lymph drainage;Other (comment);DME and/or AE  instruction;Compression bandaging;Therapeutic activities  skin care w/ low ph castor oil and/ or Eucerin lotion   Consulted and Agree with Plan of Care Patient      Patient will benefit from skilled therapeutic intervention in order to improve the following deficits and impairments:  Abnormal gait, Decreased skin integrity, Decreased knowledge of precautions, Decreased activity tolerance, Decreased knowledge of use of DME, Impaired flexibility, Decreased balance, Decreased mobility, Difficulty walking, Increased edema, Pain  Visit Diagnosis: Lymphedema, not elsewhere classified    Problem List There are no active problems to display for this patient.   Andrey Spearman, MS, OTR/L, [facility] 02/27/17 4:42 PM  [place] MAIN [facility] SERVICES [address] [place], [place], [address] Phone: [phone]   Fax:  [phone]  Name: [name] MRN: [id number] Date of Birth: Dec 13, 1940

## 2017-03-11 ENCOUNTER — Ambulatory Visit: Payer: Medicare Other | Attending: Cardiology | Admitting: Occupational Therapy

## 2017-03-11 DIAGNOSIS — I890 Lymphedema, not elsewhere classified: Secondary | ICD-10-CM | POA: Diagnosis not present

## 2017-03-11 NOTE — Therapy (Signed)
[place] MAIN [facility] SERVICES [address] [place], [place], [address] Phone: [phone]   Fax:  [phone]  Occupational Therapy Treatment  Patient Details  Name: [name] MRN: [id number] Date of Birth: 04/07/1941 Referring Provider: Isaias Cowman, MD  Encounter Date: 03/11/2017      OT End of Session - 03/11/17 1653    Visit Number 5   Number of Visits 36   Date for OT Re-Evaluation 04/20/17   OT Start Time 0300   OT Stop Time 0410   OT Time Calculation (min) 70 min   Activity Tolerance Patient tolerated treatment well;Other (comment);No increased pain  limited by decreased bed mobility, difficulty walking, and impaired transfers.   Behavior During Therapy [facility] for tasks assessed/performed      No past medical history on file.  No past surgical history on file.  There were no vitals filed for this visit.      Subjective Assessment - 03/11/17 1647    Subjective  Pt presents for OT visit 5/36 to address BLE Lymphedema, commencing with Intensive Pgase CDT to LLE first. Pt brings wraps in bag. She states she hasn't applied compression wraps since last visit. Pt is concerned about going forward with scheduled L TKA later this month with swelling essentially unchanged . OT and Pt had frank discussion regarding importance of controlling swelling for optimal wound healing, surgical or othewise. OT recommended Pt discuss her concerns with her surgeon in detail.   Pertinent History HTN, diet controlled type 2 diabetes, stage III, chronic kidney disease, hx urinary incontinence, GERD, OA ( s/p intra articular injections)   Limitations chronic leag pain and swelling, difficulty walking, impaired functional mobility and transfers, difficulty fitting street shoes and LB clothing, unable to reach feet to inspect skin, perform skin and nail care, and to isnspect skin; decreased activity tolerance, including IADLs and social activities,  requiring standing, walking, and / or prolonged sitting, fall risk, increased infection risk ( cellulitis)   Patient Stated Goals reduce swelling  so I can move better and do more   Currently in Pain? No/denies                      OT Treatments/Exercises (OP) - 03/11/17 0001      ADLs   ADL Education Given Yes     Manual Therapy   Manual Therapy Edema management;Compression Bandaging;Manual Lymphatic Drainage (MLD)   Manual therapy comments skin care to LLE w/ low pH castor oil during MLD. Telfa  placed over openings on skin on anterior L Leg   Manual Lymphatic Drainage (MLD) Manual lymph drainage (MLD) to LLE in supine utilizing functional inguinal lymph nodes and deep abdominal lymphatics as is customary for non-cancer related lower extremity LE, including bilateral "short neck" sequence, J strokes to sub and supraclavicular LN, deep abdominal pathways, functional inguinal LN, lower extremity proximal to distal w/ emphasis on medial knee bottleneck and politeal LN. Performed fibrosis technique to B maleoli and distal  legs to address fatty fibrosis.   Compression Bandaging Ankle to below Knee (A-D)  LLE gradient compression wraps applied from foot to below knee as follows: Toe wrap omitted as not indicated. . Knee length cotton stockinett from base of toes to knee. One roll Rasidol soft foam. cumferentially from foot to tibila tuberosity w/ ~ 50% overlap; single 8 cm x 5 m  to foot and ankle, one 12 cm  x 5 m ankle to achilles origin...all applied  circumferentially in custommary layered gradient configuration. Fitted w/ 3 x non-slip sock as Pt able to fit shoe over wraps.                 OT Education - 03/11/17 1650    Education provided Yes   Education Details LE self care home program   Person(s) Educated Patient   Methods Explanation;Demonstration   Comprehension Verbalized understanding             OT Long Term Goals - 01/21/17 1721      OT LONG TERM  GOAL #1   Title Pt independent w/ lymphedema precautions and prevention principals and strategies to limit LE progression and infection risk.   Baseline dependent   Time 2   Period Weeks   Status New   Target Date --  2 weeks s/p initial Rx visit     OT LONG TERM GOAL #2   Title Lymphedema (LE) management/ self-care: Pt able to apply knee length, multi layered, gradient compression wraps with modified independence ( extra time and modified positioning)  using proper techniques within 2 weeks to achieve optimal limb volume reductions bilaterally.   Baseline dependent   Time 2   Period Weeks   Status New   Target Date --  2 weeks s/p initial Rx visit     OT LONG TERM GOAL #3   Title Lymphedema (LE) management/ self-care:  Pt to achieve at least 10% limb volume reduction  on L below the knee and 5 % reduction on R below the knee during Intensive Phase CDT to limit progression, to reduce leg pain, to improve ADLs performance, and to facilitate safe functional mobility and ambulation.   Baseline dependent   Time 12   Period Weeks   Status New   Target Date 04/20/17     OT LONG TERM GOAL #4   Title LE management/ self care: Pt able to don and doff appropriate  BLE compression garments using assistive devices within 1 week of issue date with modified independence.   Baseline dependent   Time 12   Period Weeks   Status New   Target Date 04/20/17     OT LONG TERM GOAL #5   Title Lymphedema (LE) management/ self-care: During Management Phase CDT Pt to sremain infection free and ustain current limb volumes within 5%, and all other clinical gains achieved during OT treatment with needed level of caregiver assistance to limit LE progression, infection risk and further functional decline.   Baseline dependent   Time 6   Period Months   Status New   Target Date 07/19/17               Plan - 03/11/17 1654    Clinical Impression Statement Discussed importance of 23 / 7 compression  for optimal swelling reduction outcomes. Discussed importance of diligent compliance w/ all LE self care protocols, skin care, simple self-MLD, therapeutic excercise and compression, during every visit interval. Pt has not been able to attend 3 x weekly as recommended, and she has not been compliant with compression wrapping. Pt urged to discuss her swelling condition with  her knee surgeon in preparation for surgery. Pt tolerated MLD, including fibrosis technique at ankle, and compression wrapping without difficulty in clinic today. Pt coming 3 x next week.   Occupational performance deficits (Please refer to evaluation for details): ADL's;IADL's;Rest and Sleep;Work;Leisure;Social Participation   Rehab Potential Good   OT Frequency 3x / week   OT Duration 12  weeks   OT Treatment/Interventions Self-care/ADL training;Therapeutic exercise;Patient/family education;Manual Therapy;Manual lymph drainage;Other (comment);DME and/or AE instruction;Compression bandaging;Therapeutic activities  skin care w/ low ph castor oil and/ or Eucerin lotion   Consulted and Agree with Plan of Care Patient      Patient will benefit from skilled therapeutic intervention in order to improve the following deficits and impairments:  Abnormal gait, Decreased skin integrity, Decreased knowledge of precautions, Decreased activity tolerance, Decreased knowledge of use of DME, Impaired flexibility, Decreased balance, Decreased mobility, Difficulty walking, Increased edema, Pain  Visit Diagnosis: Lymphedema, not elsewhere classified    Problem List There are no active problems to display for this patient.  Andrey Spearman, MS, OTR/L, [facility] 03/11/17 4:55 PM   [place] MAIN [facility] SERVICES [address] [place], [place], [address] Phone: [phone]   Fax:  [phone]  Name: MELIS TROCHEZ MRN: [id number] Date of Birth: 05-Aug-1940

## 2017-03-12 ENCOUNTER — Encounter
Admission: RE | Admit: 2017-03-12 | Discharge: 2017-03-12 | Disposition: A | Discharge status: Home / Self Care | Payer: Medicare Other | Source: Ambulatory Visit | Attending: Surgery | Admitting: Surgery

## 2017-03-12 ENCOUNTER — Ambulatory Visit
Admission: RE | Admit: 2017-03-12 | Discharge: 2017-03-12 | Disposition: A | Discharge status: Home / Self Care | Payer: Medicare Other | Source: Ambulatory Visit | Attending: Surgery | Admitting: Surgery

## 2017-03-12 DIAGNOSIS — Z01818 Encounter for other preprocedural examination: Secondary | ICD-10-CM | POA: Diagnosis present

## 2017-03-12 DIAGNOSIS — Z01812 Encounter for preprocedural laboratory examination: Secondary | ICD-10-CM | POA: Insufficient documentation

## 2017-03-12 DIAGNOSIS — I700 Atherosclerosis of aorta: Secondary | ICD-10-CM | POA: Diagnosis not present

## 2017-03-12 HISTORY — DX: Essential (primary) hypertension: I10

## 2017-03-12 HISTORY — DX: Lymphedema, not elsewhere classified: I89.0

## 2017-03-12 HISTORY — DX: Unspecified osteoarthritis, unspecified site: M19.90

## 2017-03-12 HISTORY — DX: Prediabetes: R73.03

## 2017-03-12 HISTORY — DX: Malignant (primary) neoplasm, unspecified: C80.1

## 2017-03-12 HISTORY — DX: Pure hypercholesterolemia, unspecified: E78.00

## 2017-03-12 LAB — URINALYSIS, ROUTINE W REFLEX MICROSCOPIC
Bilirubin Urine: NEGATIVE
GLUCOSE, UA: NEGATIVE mg/dL
Hgb urine dipstick: NEGATIVE
Ketones, ur: NEGATIVE mg/dL
LEUKOCYTES UA: NEGATIVE
Nitrite: NEGATIVE
PH: 7 (ref 5.0–8.0)
Protein, ur: NEGATIVE mg/dL
SPECIFIC GRAVITY, URINE: 1.018 (ref 1.005–1.030)

## 2017-03-12 LAB — BASIC METABOLIC PANEL
Anion gap: 6 (ref 5–15)
BUN: 17 mg/dL (ref 6–20)
CO2: 28 mmol/L (ref 22–32)
CREATININE: 1.02 mg/dL — AB (ref 0.44–1.00)
Calcium: 9.9 mg/dL (ref 8.9–10.3)
Chloride: 107 mmol/L (ref 101–111)
GFR calc Af Amer: 60 mL/min — ABNORMAL LOW (ref >60)
GFR calc non Af Amer: 52 mL/min — ABNORMAL LOW (ref >60)
Glucose, Bld: 98 mg/dL (ref 65–99)
Potassium: 4.3 mmol/L (ref 3.5–5.1)
SODIUM: 141 mmol/L (ref 135–145)

## 2017-03-12 LAB — CBC
HCT: 41.1 % (ref 35.0–47.0)
HEMOGLOBIN: 13.8 g/dL (ref 12.0–16.0)
MCH: 30.1 pg (ref 26.0–34.0)
MCHC: 33.6 g/dL (ref 32.0–36.0)
MCV: 89.4 fL (ref 80.0–100.0)
Platelets: 203 10*3/uL (ref 150–440)
RBC: 4.6 MIL/uL (ref 3.80–5.20)
RDW: 14.1 % (ref 11.5–14.5)
WBC: 6.9 10*3/uL (ref 3.6–11.0)

## 2017-03-12 LAB — PROTIME-INR
INR: 1.01
PROTHROMBIN TIME: 13.2 s (ref 11.4–15.2)

## 2017-03-12 LAB — SURGICAL PCR SCREEN
MRSA, PCR: NEGATIVE
Staphylococcus aureus: NEGATIVE

## 2017-03-12 NOTE — Pre-Procedure Instructions (Signed)
ECG 12-lead4/19/2018 [place] [facility] Component Name Value Ref Range  Vent Rate (bpm) 75   PR Interval (msec) 156   QRS Interval (msec) 84   QT Interval (msec) 406   QTc (msec) 453   Result Narrative  Normal sinus rhythm , leftward axis, normal intervals Normal ECG When compared with ECG of 18-Sep-2015 11:36, QT has lengthened I reviewed and concur with this report. Electronically signed MA:UQJFH MD, JASMINE ([id number]) on 03/03/2017 6:03:15 PM

## 2017-03-12 NOTE — Patient Instructions (Signed)
  Your procedure is scheduled on: Tuesday Sept 25, 2018. Report to Same Day Surgery. To find out your arrival time please call [phone] between 1PM - 3PM on Monday Sept. 24, 2018.  Remember: Instructions that are not followed completely may result in serious medical risk, up to and including death, or upon the discretion of your surgeon and anesthesiologist your surgery may need to be rescheduled.    _x___ 1. Do not eat food after midnight. No gum chewing or hard candies. May drink water, apple juice,    Gator aide, black coffee or tea up to 2 hours prior to your arrival to hospital.    _x___ 2. No Alcohol for 24 hours before or after surgery.   ____ 3. Bring all medications with you on the day of surgery if instructed.    __x__ 4. Notify your doctor if there is any change in your medical condition     (cold, fever, infections).    _____ 5. No smoking 24 hours prior to surgery.     Do not wear jewelry, make-up, hairpins, clips or nail polish.  Do not wear lotions, powders, or perfumes.   Do not shave 48 hours prior to surgery. Men may shave face and neck.  Do not bring valuables to the hospital.    [facility] is not responsible for any belongings or valuables.               Contacts, dentures or bridgework may not be worn into surgery.  Leave your suitcase in the car. After surgery it may be brought to your room.  For patients admitted to the hospital, discharge time is determined by your treatment team.   Patients discharged the day of surgery will not be allowed to drive home.    Please read over the following fact sheets that you were given:   [facility] Preparing for Surgery  _x___ Take these medicines the morning of surgery with A SIP OF WATER:    1. omeprazole (PRILOSEC) take at bedtime the night prior to surgery and the morning of surgery.  2. traMADol (ULTRAM) optional  3. Tylenol optional   ____ Fleet Enema (as directed)   _x___ Use CHG Soap as directed on  instruction sheet  ____ Use inhalers on the day of surgery and bring to hospital day of surgery  ____ Stop metformin 2 days prior to surgery    ____ Take 1/2 of usual insulin dose the night before surgery and none on the morning of  surgery.   ____ Stop Coumadin/Plavix/aspirin on does not apply  _x___ Stop Anti-inflammatories such as Advil, Aleve, Ibuprofen, Motrin, Naproxen, Naprosyn, Goodies powders or aspirin  products. OK to take Tylenol and Tramadol.   __x__ Stop supplements: Omega-3 Krill Oil until after surgery.    ____ Bring C-Pap to the hospital.

## 2017-03-13 ENCOUNTER — Ambulatory Visit: Payer: Medicare Other | Admitting: Occupational Therapy

## 2017-03-13 ENCOUNTER — Encounter: Payer: Medicare Other | Admitting: Occupational Therapy

## 2017-03-13 DIAGNOSIS — I890 Lymphedema, not elsewhere classified: Secondary | ICD-10-CM | POA: Diagnosis not present

## 2017-03-13 NOTE — Therapy (Signed)
[place] MAIN [facility] SERVICES [address] [place], [place], [address] Phone: [phone]   Fax:  [phone]  Occupational Therapy Treatment  Patient Details  Name: [name] MRN: [id number] Date of Birth: 09-24-1940 Referring Provider: Isaias Cowman, MD  Encounter Date: 03/13/2017      OT End of Session - 03/13/17 1710    Visit Number 6   Number of Visits 36   Date for OT Re-Evaluation 04/20/17   OT Start Time 0900   OT Stop Time 1005   OT Time Calculation (min) 65 min   Activity Tolerance Patient tolerated treatment well;Other (comment);No increased pain  limited by decreased bed mobility, difficulty walking, and impaired transfers.   Behavior During Therapy [facility] for tasks assessed/performed      Past Medical History:  Diagnosis Date  . Arthritis    knees  . Cancer ([place]) 1990,s   left breast  . Hypercholesterolemia   . Hypertension   . Lymphedema of leg    left leg worse than right leg.  . Pre-diabetes     Past Surgical History:  Procedure Laterality Date  . BREAST SURGERY Left 1990   lumpectomy  . BUNIONECTOMY Bilateral 1990's   plus hammer toe repair on left foot.    There were no vitals filed for this visit.      Subjective Assessment - 03/13/17 1707    Subjective  Pt presents for OT visit 6/36 to address BLE Lymphedema, Pt arrives w/ LLE compression qwraps in place since last visit.   Pertinent History HTN, diet controlled type 2 diabetes, stage III, chronic kidney disease, hx urinary incontinence, GERD, OA ( s/p intra articular injections)   Limitations chronic leag pain and swelling, difficulty walking, impaired functional mobility and transfers, difficulty fitting street shoes and LB clothing, unable to reach feet to inspect skin, perform skin and nail care, and to isnspect skin; decreased activity tolerance, including IADLs and social activities, requiring standing, walking, and / or prolonged  sitting, fall risk, increased infection risk ( cellulitis)   Patient Stated Goals reduce swelling  so I can move better and do more   Currently in Pain? Yes  chronic arthritis pain- B knees. Not rated numerically today                      OT Treatments/Exercises (OP) - 03/13/17 0001      ADLs   ADL Education Given Yes     Manual Therapy   Manual Therapy Edema management;Compression Bandaging;Manual Lymphatic Drainage (MLD)   Manual therapy comments skin care to LLE w/ low pH castor oil during MLD. Telfa  placed over openings on skin on anterior L Leg   Manual Lymphatic Drainage (MLD) Manual lymph drainage (MLD) to LLE in supine utilizing functional inguinal lymph nodes and deep abdominal lymphatics as is customary for non-cancer related lower extremity LE, including bilateral "short neck" sequence, J strokes to sub and supraclavicular LN, deep abdominal pathways, functional inguinal LN, lower extremity proximal to distal w/ emphasis on medial knee bottleneck and politeal LN. Performed fibrosis technique to B maleoli and distal  legs to address fatty fibrosis.   Compression Bandaging Ankle to below Knee (A-D)  LLE gradient compression wraps applied from foot to below knee as follows: Toe wrap omitted as not indicated. . Knee length cotton stockinett from base of toes to knee. One roll Rasidol soft foam. cumferentially from foot to tibila tuberosity w/ ~ 50% overlap; single 8  cm x 5 m  to foot and ankle, one 12 cm  x 5 m ankle to achilles origin...all applied circumferentially in custommary layered gradient configuration. Fitted w/ 3 x non-slip sock as Pt able to fit shoe over wraps.                 OT Education - 03/13/17 1709    Education provided Yes   Education Details Provided Pt/caregiver skilled education and ADL training throughout visit for lymphedema etiology, progression, and treatment including Intensive and Management Phase Complete Decongestive Therapy (CDT)   Discussed lymphedema precautions, cellulitis risk, and all CDT and LE self-care components, including compression wrapping/ garments & devices, lymphatic pumping ther ex, simple self-MLD, and skin care. Provided printed Lymphedema Workbook for reference.   Person(s) Educated Patient   Methods Explanation;Demonstration   Comprehension Verbalized understanding;Need further instruction;Returned demonstration             OT Long Term Goals - 01/21/17 1721      OT LONG TERM GOAL #1   Title Pt independent w/ lymphedema precautions and prevention principals and strategies to limit LE progression and infection risk.   Baseline dependent   Time 2   Period Weeks   Status New   Target Date --  2 weeks s/p initial Rx visit     OT LONG TERM GOAL #2   Title Lymphedema (LE) management/ self-care: Pt able to apply knee length, multi layered, gradient compression wraps with modified independence ( extra time and modified positioning)  using proper techniques within 2 weeks to achieve optimal limb volume reductions bilaterally.   Baseline dependent   Time 2   Period Weeks   Status New   Target Date --  2 weeks s/p initial Rx visit     OT LONG TERM GOAL #3   Title Lymphedema (LE) management/ self-care:  Pt to achieve at least 10% limb volume reduction  on L below the knee and 5 % reduction on R below the knee during Intensive Phase CDT to limit progression, to reduce leg pain, to improve ADLs performance, and to facilitate safe functional mobility and ambulation.   Baseline dependent   Time 12   Period Weeks   Status New   Target Date 04/20/17     OT LONG TERM GOAL #4   Title LE management/ self care: Pt able to don and doff appropriate  BLE compression garments using assistive devices within 1 week of issue date with modified independence.   Baseline dependent   Time 12   Period Weeks   Status New   Target Date 04/20/17     OT LONG TERM GOAL #5   Title Lymphedema (LE) management/  self-care: During Management Phase CDT Pt to sremain infection free and ustain current limb volumes within 5%, and all other clinical gains achieved during OT treatment with needed level of caregiver assistance to limit LE progression, infection risk and further functional decline.   Baseline dependent   Time 6   Period Months   Status New   Target Date 07/19/17               Plan - 03/13/17 1710    Clinical Impression Statement Pt tolerated all aspects of manual therapy for lymphedema including MLD, skin care and compression wrapping. Slight decrease in LLY limb volume noted today after 48 hours uninterruppted compression, but no palpable decrease in tissue density below L knee noted today, or since commencing OT. Cont as per POC.  Pt with only 2 remaining OT visits next week before undergoing L TKA  the following week.   Occupational performance deficits (Please refer to evaluation for details): ADL's;IADL's;Rest and Sleep;Work;Leisure;Social Participation;Other  body image   Rehab Potential Good   OT Frequency 3x / week   OT Duration 12 weeks   OT Treatment/Interventions Self-care/ADL training;Therapeutic exercise;Patient/family education;Manual Therapy;Manual lymph drainage;Other (comment);DME and/or AE instruction;Compression bandaging;Therapeutic activities  skin care w/ low ph castor oil and/ or Eucerin lotion   Consulted and Agree with Plan of Care Patient      Patient will benefit from skilled therapeutic intervention in order to improve the following deficits and impairments:  Abnormal gait, Decreased skin integrity, Decreased knowledge of precautions, Decreased activity tolerance, Decreased knowledge of use of DME, Impaired flexibility, Decreased balance, Decreased mobility, Difficulty walking, Increased edema, Pain  Visit Diagnosis: Lymphedema, not elsewhere classified    Problem List There are no active problems to display for this patient.   Andrey Spearman, MS,  OTR/L, [facility] 03/13/17 5:14 PM  [place] MAIN [facility] SERVICES [address] [place], [place], [address] Phone: [phone]   Fax:  [phone]  Name: [name] MRN: [id number] Date of Birth: 1940-09-21

## 2017-03-17 ENCOUNTER — Ambulatory Visit: Payer: Medicare Other | Admitting: Occupational Therapy

## 2017-03-17 DIAGNOSIS — I890 Lymphedema, not elsewhere classified: Secondary | ICD-10-CM | POA: Diagnosis not present

## 2017-03-17 NOTE — Therapy (Signed)
[place] MAIN [facility] SERVICES [address] [place], [place], [address] Phone: [phone]   Fax:  [phone]  Occupational Therapy Treatment  Patient Details  Name: [name] MRN: [id number] Date of Birth: [birth year] Referring Provider: Isaias Cowman, MD  Encounter Date: 03/17/2017      OT End of Session - 03/17/17 1536    Visit Number 7   Number of Visits 36   Date for OT Re-Evaluation 04/20/17   OT Start Time 0206   OT Stop Time 0306   OT Time Calculation (min) 60 min   Activity Tolerance Patient tolerated treatment well;Other (comment);No increased pain  limited by decreased bed mobility, difficulty walking, and impaired transfers.   Behavior During Therapy [facility] for tasks assessed/performed      Past Medical History:  Diagnosis Date  . Arthritis    knees  . Cancer ([place]) 1990,s   left breast  . Hypercholesterolemia   . Hypertension   . Lymphedema of leg    left leg worse than right leg.  . Pre-diabetes     Past Surgical History:  Procedure Laterality Date  . BREAST SURGERY Left 1990   lumpectomy  . BUNIONECTOMY Bilateral 1990's   plus hammer toe repair on left foot.    There were no vitals filed for this visit.      Subjective Assessment - 03/17/17 1531    Subjective  Pt presents for OT visit 7/36 to address BLE Lymphedema, Pt arrives w/ LLE compression wraps not in place. Pt requests additional visit next week in prep for upcoming knee surgery if possible. Pt and OT discussed questions to ask  orthopedic surgeopn at her upcoming and last appointment   before surgery next week.   Pertinent History HTN, diet controlled type 2 diabetes, stage III, chronic kidney disease, hx urinary incontinence, GERD, OA ( s/p intra articular injections)   Limitations chronic leag pain and swelling, difficulty walking, impaired functional mobility and transfers, difficulty fitting street shoes and LB clothing, unable to reach  feet to inspect skin, perform skin and nail care, and to isnspect skin; decreased activity tolerance, including IADLs and social activities, requiring standing, walking, and / or prolonged sitting, fall risk, increased infection risk ( cellulitis)   Patient Stated Goals reduce swelling  so I can move better and do more   Currently in Pain? No/denies  knee pain increases to 8/10 w/ ambulation and transfers. no pain at rest in supine w/ legs elevated.                      OT Treatments/Exercises (OP) - 03/17/17 0001      ADLs   ADL Education Given Yes     Manual Therapy   Manual Therapy Edema management;Manual Lymphatic Drainage (MLD);Compression Bandaging   Manual therapy comments skin care to LLE w/ low pH castor oil during MLD. Telfa  placed over openings on skin on anterior L Leg   Edema Management mild lymphorrhea from one distal posteri RLE observed during CDT to LLE.   Manual Lymphatic Drainage (MLD) Manual lymph drainage (MLD) to LLE in supine utilizing functional inguinal lymph nodes and deep abdominal lymphatics as is customary for non-cancer related lower extremity LE, including bilateral "short neck" sequence, J strokes to sub and supraclavicular LN, deep abdominal pathways, functional inguinal LN, lower extremity proximal to distal w/ emphasis on medial knee bottleneck and politeal LN. Performed fibrosis technique to B maleoli and distal  legs to  address fatty fibrosis.   Compression Bandaging Ankle to below Knee (A-D)  LLE gradient compression wraps applied from foot to below knee as follows: Toe wrap omitted as not indicated. . Knee length cotton stockinett from base of toes to knee. One roll Rasidol soft foam. cumferentially from foot to tibila tuberosity w/ ~ 50% overlap; single 8 cm x 5 m  to foot and ankle, one 12 cm  x 5 m ankle to achilles origin...all applied circumferentially in custommary layered gradient configuration. Fitted w/ 3 x non-slip sock as Pt able to  fit shoe over wraps.                 OT Education - 03/17/17 1536    Education provided Yes   Education Details Continued skilled Pt/caregiver education  And LE ADL training throughout visit for lymphedema self care/ home program, including compression wrapping, compression garment and device wear/care, lymphatic pumping ther ex, simple self-MLD, and skin care. Discussed progress towards goals.   Person(s) Educated Patient   Methods Explanation;Demonstration   Comprehension Verbalized understanding             OT Long Term Goals - 01/21/17 1721      OT LONG TERM GOAL #1   Title Pt independent w/ lymphedema precautions and prevention principals and strategies to limit LE progression and infection risk.   Baseline dependent   Time 2   Period Weeks   Status New   Target Date --  2 weeks s/p initial Rx visit     OT LONG TERM GOAL #2   Title Lymphedema (LE) management/ self-care: Pt able to apply knee length, multi layered, gradient compression wraps with modified independence ( extra time and modified positioning)  using proper techniques within 2 weeks to achieve optimal limb volume reductions bilaterally.   Baseline dependent   Time 2   Period Weeks   Status New   Target Date --  2 weeks s/p initial Rx visit     OT LONG TERM GOAL #3   Title Lymphedema (LE) management/ self-care:  Pt to achieve at least 10% limb volume reduction  on L below the knee and 5 % reduction on R below the knee during Intensive Phase CDT to limit progression, to reduce leg pain, to improve ADLs performance, and to facilitate safe functional mobility and ambulation.   Baseline dependent   Time 12   Period Weeks   Status New   Target Date 04/20/17     OT LONG TERM GOAL #4   Title LE management/ self care: Pt able to don and doff appropriate  BLE compression garments using assistive devices within 1 week of issue date with modified independence.   Baseline dependent   Time 12   Period  Weeks   Status New   Target Date 04/20/17     OT LONG TERM GOAL #5   Title Lymphedema (LE) management/ self-care: During Management Phase CDT Pt to sremain infection free and ustain current limb volumes within 5%, and all other clinical gains achieved during OT treatment with needed level of caregiver assistance to limit LE progression, infection risk and further functional decline.   Baseline dependent   Time 6   Period Months   Status New   Target Date 07/19/17               Plan - 03/17/17 1536    Clinical Impression Statement Visibl;e and palpable decrease in distal leg and R ankle swelling at end  of session after MLD today. Pt typically has improved condition after MLD, burt she is unable to sustain them between visitys due to difficulty reapplying wraps to her leg. Pt does demonstate some improved compliance by leaving wraps in place longer when visit intervals are shorter.Continue to assist w/ limb volume reduction in preparation for R TKA next week.  Pt verbalized understanding that she will need a new referral from her doctor to resume OT afterwards due to change in medical status.   Occupational performance deficits (Please refer to evaluation for details): ADL's;IADL's;Rest and Sleep;Work;Leisure;Social Participation;Other  functional ambulation and transfers, body image   Rehab Potential Good   OT Frequency 3x / week   OT Duration 12 weeks   OT Treatment/Interventions Self-care/ADL training;Therapeutic exercise;Patient/family education;Manual Therapy;Manual lymph drainage;Other (comment);DME and/or AE instruction;Compression bandaging;Therapeutic activities  skin care w/ low ph castor oil and/ or Eucerin lotion   Consulted and Agree with Plan of Care Patient      Patient will benefit from skilled therapeutic intervention in order to improve the following deficits and impairments:  Abnormal gait, Decreased skin integrity, Decreased knowledge of precautions, Decreased  activity tolerance, Decreased knowledge of use of DME, Impaired flexibility, Decreased balance, Decreased mobility, Difficulty walking, Increased edema, Pain  Visit Diagnosis: Lymphedema, not elsewhere classified    Problem List There are no active problems to display for this patient.  Andrey Spearman, MS, OTR/L, [facility] 03/17/17 3:42 PM  [place] MAIN [facility] SERVICES [address] [place], [place], [address] Phone: [phone]   Fax:  [phone]  Name: [name] MRN: [id number] Date of Birth: 1940/12/04

## 2017-03-19 ENCOUNTER — Encounter: Payer: Self-pay | Admitting: *Deleted

## 2017-03-19 ENCOUNTER — Other Ambulatory Visit
Admission: RE | Admit: 2017-03-19 | Discharge: 2017-03-19 | Disposition: A | Discharge status: Home / Self Care | Payer: Medicare Other | Source: Ambulatory Visit | Attending: Internal Medicine | Admitting: Internal Medicine

## 2017-03-19 ENCOUNTER — Emergency Department: Payer: Medicare Other

## 2017-03-19 ENCOUNTER — Ambulatory Visit: Payer: Medicare Other | Admitting: Occupational Therapy

## 2017-03-19 ENCOUNTER — Emergency Department
Admission: EM | Admit: 2017-03-19 | Discharge: 2017-03-19 | Disposition: A | Discharge status: Home / Self Care | Payer: Medicare Other | Attending: Emergency Medicine | Admitting: Emergency Medicine

## 2017-03-19 DIAGNOSIS — M7989 Other specified soft tissue disorders: Secondary | ICD-10-CM | POA: Insufficient documentation

## 2017-03-19 DIAGNOSIS — R2242 Localized swelling, mass and lump, left lower limb: Secondary | ICD-10-CM | POA: Diagnosis present

## 2017-03-19 DIAGNOSIS — Z79899 Other long term (current) drug therapy: Secondary | ICD-10-CM | POA: Diagnosis not present

## 2017-03-19 DIAGNOSIS — M79604 Pain in right leg: Secondary | ICD-10-CM | POA: Insufficient documentation

## 2017-03-19 DIAGNOSIS — I890 Lymphedema, not elsewhere classified: Secondary | ICD-10-CM

## 2017-03-19 DIAGNOSIS — L03116 Cellulitis of left lower limb: Secondary | ICD-10-CM | POA: Diagnosis not present

## 2017-03-19 DIAGNOSIS — I10 Essential (primary) hypertension: Secondary | ICD-10-CM | POA: Insufficient documentation

## 2017-03-19 LAB — FIBRIN DERIVATIVES D-DIMER (ARMC ONLY): Fibrin derivatives D-dimer (ARMC): 1766.92 — ABNORMAL HIGH (ref 0.00–499.00)

## 2017-03-19 MED ORDER — CEFAZOLIN SODIUM 1 G IJ SOLR
1.0000 g | Freq: Once | INTRAMUSCULAR | Status: AC
  Administered 2017-03-19: 1 g via INTRAMUSCULAR
  Filled 2017-03-19: qty 10

## 2017-03-19 NOTE — ED Provider Notes (Signed)
[facility] Emergency Department Provider Note   ____________________________________________   I have reviewed the triage vital signs and the nursing notes.   HISTORY  Chief Complaint Leg Swelling    HPI [name] is a 76 y.o. female presents to the emergency department with right lower leg erythema, swelling that developed earlier yesterday. Patient was seen by her primary care provider and was prescribed antibiotics and left the facility. She was later called back and they advised her to the emergency department to be assessed for a DVT due to her d-dimer being 1766.92. Patient denies fever or chills associated with current work shortly symptoms and has been able to ambulate although the extremity is painful when walking and tender to touch. Patient denies headache, vision changes, chest pain, chest tightness, shortness of breath, abdominal pain, nausea and vomiting.  Past Medical History:  Diagnosis Date  . Arthritis    knees  . Cancer ([place]) 1990,s   left breast  . Hypercholesterolemia   . Hypertension   . Lymphedema of leg    left leg worse than right leg.  . Pre-diabetes     There are no active problems to display for this patient.   Past Surgical History:  Procedure Laterality Date  . BREAST SURGERY Left 1990   lumpectomy  . BUNIONECTOMY Bilateral 1990's   plus hammer toe repair on left foot.    Prior to Admission medications   Medication Sig Start Date End Date Taking? Authorizing Provider  acetaminophen (TYLENOL) 500 MG tablet Take 500 mg by mouth every 8 (eight) hours as needed.    [provider]  atorvastatin (LIPITOR) 20 MG tablet Take 20 mg by mouth at bedtime.    [provider]  docusate sodium (COLACE) 50 MG capsule Take 150 mg by mouth daily as needed for mild constipation.    [provider]  gabapentin (NEURONTIN) 300 MG capsule Take 300 mg by mouth at bedtime. 01/08/17   [provider]  losartan (COZAAR) 25 MG tablet Take 25 mg by mouth every morning.  01/20/17   [provider]  magnesium oxide (MAG-OX) 400 MG tablet Take 400 mg by mouth daily.    [provider]  Omega-3 Krill Oil 500 MG CAPS Take 500 mg by mouth daily.    [provider]  omeprazole (PRILOSEC) 20 MG capsule Take 20 mg by mouth at bedtime as needed. Acid reflux 01/06/17   [provider]  potassium chloride (K-DUR,KLOR-CON) 10 MEQ tablet Take 10 mEq by mouth daily.     [provider]  solifenacin (VESICARE) 5 MG tablet Take 5 mg by mouth daily.     [provider]  TOPROL XL 25 MG 24 hr tablet Take 25 mg by mouth at bedtime. 12/28/16   [provider]  torsemide (DEMADEX) 20 MG tablet Take 20 mg by mouth daily as needed (fluid).    [provider]  traMADol (ULTRAM) 50 MG tablet Take 50 mg by mouth 3 (three) times daily. 12/31/16   [provider]  VOLTAREN 1 % GEL Apply 1 application topically 3 (three) times daily as needed for pain. 12/16/16   [provider]    Allergies Colesevelam and Rosuvastatin  History reviewed. No pertinent family history.  Social History Social History  Substance Use Topics  . Smoking status: Never Smoker  . Smokeless tobacco: Never Used  . Alcohol use Yes     Comment: 1-2 drinks every 1-2 months  Review of Systems Constitutional: Negative for fever/chills Eyes: No visual changes. ENT:  Negative for sore throat and for difficulty swallowing Cardiovascular: Denies chest pain. Respiratory: Denies cough. Denies shortness of breath. Gastrointestinal: No abdominal pain.  No nausea, vomiting, diarrhea. Genitourinary: Negative for dysuria. Musculoskeletal: Positive for right lower leg pain Skin: Negative for rash. Positive for right lower leg redness and swelling.  Neurological: Negative for headaches.  Negative focal weakness or numbness. Negative for loss of  consciousness. Able to ambulate. ____________________________________________   PHYSICAL EXAM:  VITAL SIGNS: ED Triage Vitals  Enc Vitals Group     BP 03/19/17 1724 (!) 148/57     Pulse Rate 03/19/17 1724 93     Resp 03/19/17 1724 16     Temp 03/19/17 1724 98.7 F (37.1 C)     Temp Source 03/19/17 1724 Oral     SpO2 03/19/17 1724 100 %     Weight 03/19/17 1724 240 lb (108.9 kg)     Height 03/19/17 1724 5\' 11"  (1.803 m)     Head Circumference --      Peak Flow --      Pain Score 03/19/17 1723 6     Pain Loc --      Pain Edu? --      Excl. in [place]? --     Constitutional: Alert and oriented. Well appearing and in no acute distress.  Eyes: Conjunctivae are normal. PERRL. EOMI  Head: Normocephalic and atraumatic. ENT:      Ears: Canals clear. TMs intact bilaterally.      Nose: No congestion/rhinnorhea.      Mouth/Throat: Mucous membranes are moist.  Neck:Supple. No thyromegaly. No stridor.  Cardiovascular: Normal rate, regular rhythm. Normal S1 and S2.  Good peripheral circulation. Respiratory: Normal respiratory effort without tachypnea or retractions. Lungs CTAB. No wheezes/rales/rhonchi. Good air entry to the bases with no decreased or absent breath sounds. Hematological/Lymphatic/Immunological: No cervical lymphadenopathy. Cardiovascular: Normal rate, regular rhythm. Normal distal pulses. Gastrointestinal: Bowel sounds 4 quadrants. Soft and nontender to palpation.  Musculoskeletal: Nontender with normal range of motion in all extremities. Neurologic: Normal speech and language. Skin:  Skin is warm, dry and intact. No rash noted. Right lower leg erythema, edema and pain consistent with cellulitis.  Psychiatric: Mood and affect are normal. Speech and behavior are normal. Patient exhibits appropriate insight and judgement.  ____________________________________________   LABS (all labs ordered are listed, but only abnormal results are displayed)  Labs Reviewed - No data to  display ____________________________________________  EKG none ____________________________________________  RADIOLOGY US Venous Img Lower Unilateral Right FINDINGS: Contralateral Common Femoral Vein: Respiratory phasicity is normal and symmetric with the symptomatic side. No evidence of thrombus. Normal compressibility.  Common Femoral Vein: No evidence of thrombus. Normal compressibility, respiratory phasicity and response to augmentation.  Saphenofemoral Junction: No evidence of thrombus. Normal compressibility and flow on color Doppler imaging.  Profunda Femoral Vein: No evidence of thrombus. Normal compressibility and flow on color Doppler imaging.  Femoral Vein: No evidence of thrombus. Normal compressibility, respiratory phasicity and response to augmentation.  Popliteal Vein: No evidence of thrombus. Normal compressibility, respiratory phasicity and response to augmentation.  Calf Veins: No evidence of thrombus. Suboptimal visualization due to edema.  Other Findings: None.  IMPRESSION: No evidence of DVT within the right lower extremity. ____________________________________________   PROCEDURES  Procedure(s) performed: no    Critical Care performed: no ____________________________________________   INITIAL IMPRESSION / ASSESSMENT AND PLAN / ED COURSE  Pertinent labs & imaging results  that were available during my care of the patient were reviewed by me and considered in my medical decision making (see chart for details).  Patient presents to emergency department with right lower leg  pain, erythema, swelling . History, physical exam findings and imaging are reassuring symptoms are consistent with right lower leg cellulitis. US imaging was negative for DVT. Antibiotics, cefazolin, initiated during the course of care in the emergency department. Patient begin antibiotic regimen prescribed by her PCP once home. Patient advised to follow up with PCP as  needed or return to the emergency department if symptoms return or worsen. Patient informed of clinical course, understand medical decision-making process, and agree with plan. ____________________________________________   FINAL CLINICAL IMPRESSION(S) / ED DIAGNOSES  Final diagnoses:  Left leg cellulitis       NEW MEDICATIONS STARTED DURING THIS VISIT:  Discharge Medication List as of 03/19/2017  8:45 PM       Note:  This document was prepared using Dragon voice recognition software and may include unintentional dictation errors.    Jerolyn Shin, PA-C 03/20/17 1502    Darel Hong, MD 03/21/17 1059

## 2017-03-19 NOTE — ED Triage Notes (Signed)
Pt sent to ED from PCP after a D-dimer of 1766.92. PT has redness and swelling to right lower leg that began suddenly yesterday. No fever reported but pt verbalized increased pain with ambulation. Pt sent to rule out DVT.

## 2017-03-19 NOTE — Discharge Instructions (Signed)
Take existing antibiotics as directed. Continue to monitor right lower leg for signs of improvement. Return to your primary care provider or the emergency department if you experience signs of worsening symptoms.

## 2017-03-19 NOTE — ED Notes (Signed)
Right lower extremity swollen; erythematous, and tender to touch. Denies fevers; admits to pain ambulating

## 2017-03-19 NOTE — Patient Instructions (Signed)
Pt instructed to go to walk in clinic before MD appt at 2:45 PM today for evbaluation and treatment of  RLE cellulitis.

## 2017-03-19 NOTE — Therapy (Signed)
[place] MAIN [facility] SERVICES [address] [place], [place], [address] Phone: [phone]   Fax:  [phone]  Occupational Therapy Treatment  Patient Details  Name: [name] MRN: [id number] Date of Birth: 14-Sep-1940 Referring Provider: Isaias Cowman, MD  Encounter Date: 03/19/2017      OT End of Session - 03/19/17 1137    Visit Number 8   Number of Visits 36   Date for OT Re-Evaluation 04/20/17   OT Start Time 1111   OT Stop Time 1130   OT Time Calculation (min) 19 min   Activity Tolerance Patient tolerated treatment well;Other (comment);No increased pain  limited by decreased bed mobility, difficulty walking, and impaired transfers.   Behavior During Therapy [facility] for tasks assessed/performed      Past Medical History:  Diagnosis Date  . Arthritis    knees  . Cancer ([place]) 1990,s   left breast  . Hypercholesterolemia   . Hypertension   . Lymphedema of leg    left leg worse than right leg.  . Pre-diabetes     Past Surgical History:  Procedure Laterality Date  . BREAST SURGERY Left 1990   lumpectomy  . BUNIONECTOMY Bilateral 1990's   plus hammer toe repair on left foot.    There were no vitals filed for this visit.      Subjective Assessment - 03/19/17 1133    Subjective  Pt presents for OT visit 8/36 to address BLE Lymphedema, Pt arrives w/ out LLE compression wraps in place. Pt appologises for being 10 minutes late to appointment. "I went to [facility] and back yesterday and when I got home last night I was so tired. I forgot my wraps this morning too."   Pertinent History HTN, diet controlled type 2 diabetes, stage III, chronic kidney disease, hx urinary incontinence, GERD, OA ( s/p intra articular injections)   Limitations chronic leag pain and swelling, difficulty walking, impaired functional mobility and transfers, difficulty fitting street shoes and LB clothing, unable to reach feet to inspect skin, perform skin  and nail care, and to isnspect skin; decreased activity tolerance, including IADLs and social activities, requiring standing, walking, and / or prolonged sitting, fall risk, increased infection risk ( cellulitis)   Patient Stated Goals reduce swelling  so I can move better and do more   Currently in Pain? Yes  knee pain unchanged from initial eval.   Pain Location Knee   Pain Orientation Right;Left   Pain Type Chronic pain                      OT Treatments/Exercises (OP) - 03/19/17 0001      ADLs   ADL Education Given Yes                OT Education - 03/19/17 1137    Education provided Yes   Education Details Pt edu for cellulitis signs, symptoms, infection risks, and treatment   Person(s) Educated Patient   Methods Explanation;Handout;Demonstration   Comprehension Verbalized understanding;Returned demonstration             OT Long Term Goals - 01/21/17 1721      OT LONG TERM GOAL #1   Title Pt independent w/ lymphedema precautions and prevention principals and strategies to limit LE progression and infection risk.   Baseline dependent   Time 2   Period Weeks   Status New   Target Date --  2 weeks s/p initial Rx visit  OT LONG TERM GOAL #2   Title Lymphedema (LE) management/ self-care: Pt able to apply knee length, multi layered, gradient compression wraps with modified independence ( extra time and modified positioning)  using proper techniques within 2 weeks to achieve optimal limb volume reductions bilaterally.   Baseline dependent   Time 2   Period Weeks   Status New   Target Date --  2 weeks s/p initial Rx visit     OT LONG TERM GOAL #3   Title Lymphedema (LE) management/ self-care:  Pt to achieve at least 10% limb volume reduction  on L below the knee and 5 % reduction on R below the knee during Intensive Phase CDT to limit progression, to reduce leg pain, to improve ADLs performance, and to facilitate safe functional mobility and  ambulation.   Baseline dependent   Time 12   Period Weeks   Status New   Target Date 04/20/17     OT LONG TERM GOAL #4   Title LE management/ self care: Pt able to don and doff appropriate  BLE compression garments using assistive devices within 1 week of issue date with modified independence.   Baseline dependent   Time 12   Period Weeks   Status New   Target Date 04/20/17     OT LONG TERM GOAL #5   Title Lymphedema (LE) management/ self-care: During Management Phase CDT Pt to sremain infection free and ustain current limb volumes within 5%, and all other clinical gains achieved during OT treatment with needed level of caregiver assistance to limit LE progression, infection risk and further functional decline.   Baseline dependent   Time 6   Period Months   Status New   Target Date 07/19/17               Plan - 03/19/17 1138    Clinical Impression Statement Pt presents with RLE cellulitis infection. (non-Rx limb). Per precautions, no MLD or compression todauy. Pt sent to Walk In clinic for evaluation and treatmwent immediately after this abbreviated appointment, and before her orthopedic apt at 2:45 PM today.   Occupational performance deficits (Please refer to evaluation for details): ADL's;IADL's;Rest and Sleep;Work;Leisure;Social Participation;Other  body image   Rehab Potential Good   OT Frequency 3x / week   OT Duration 12 weeks   OT Treatment/Interventions Self-care/ADL training;Therapeutic exercise;Patient/family education;Manual Therapy;Manual lymph drainage;Other (comment);DME and/or AE instruction;Compression bandaging;Therapeutic activities  skin care w/ low ph castor oil and/ or Eucerin lotion   Consulted and Agree with Plan of Care Patient      Patient will benefit from skilled therapeutic intervention in order to improve the following deficits and impairments:  Abnormal gait, Decreased skin integrity, Decreased knowledge of precautions, Decreased activity  tolerance, Decreased knowledge of use of DME, Impaired flexibility, Decreased balance, Decreased mobility, Difficulty walking, Increased edema, Pain  Visit Diagnosis: Lymphedema, not elsewhere classified    Problem List There are no active problems to display for this patient.   Andrey Spearman, MS, OTR/L, [facility] 03/19/17 11:42 AM  [place] MAIN [facility] SERVICES [name] [place], [place], [address] Phone: [phone]   Fax:  [phone]  Name: [name] MRN: [id number] Date of Birth: [birth year]

## 2017-03-20 ENCOUNTER — Ambulatory Visit: Payer: Medicare Other | Admitting: Occupational Therapy

## 2017-03-24 ENCOUNTER — Ambulatory Visit: Payer: Medicare Other | Admitting: Occupational Therapy

## 2017-03-24 DIAGNOSIS — I890 Lymphedema, not elsewhere classified: Secondary | ICD-10-CM

## 2017-03-24 NOTE — Therapy (Signed)
[place] MAIN [facility] SERVICES [address] [place], [place], [address] Phone: [phone]   Fax:  [phone]  Occupational Therapy Treatment  Patient Details  Name: [name] MRN: [id number] Date of Birth: [birth year] Referring Provider: Isaias Cowman, MD  Encounter Date: 03/24/2017      OT End of Session - 03/24/17 1709    Visit Number 9   Number of Visits 36   Date for OT Re-Evaluation 04/20/17   OT Start Time 0100   OT Stop Time 0210   OT Time Calculation (min) 70 min   Activity Tolerance Patient tolerated treatment well;Other (comment);No increased pain  limited by decreased bed mobility, difficulty walking, and impaired transfers.   Behavior During Therapy [facility] for tasks assessed/performed      Past Medical History:  Diagnosis Date  . Arthritis    knees  . Cancer ([place]) 1990,s   left breast  . Hypercholesterolemia   . Hypertension   . Lymphedema of leg    left leg worse than right leg.  . Pre-diabetes     Past Surgical History:  Procedure Laterality Date  . BREAST SURGERY Left 1990   lumpectomy  . BUNIONECTOMY Bilateral 1990's   plus hammer toe repair on left foot.    There were no vitals filed for this visit.      Subjective Assessment - 03/24/17 1700    Subjective  Pt presents for OT visit 9/36 to for follow along for chronic, progressive, BLE Lymphedema, Pt arrives w/ out compression garments in place. Pt confirms dx of RLE cellulitis after last session. Pt was prescribed oral antibiotic. Knee surgery was rescheduled for 2nd week in Oct.   Pertinent History HTN, diet controlled type 2 diabetes, stage III, chronic kidney disease, hx urinary incontinence, GERD, OA ( s/p intra articular injections)   Limitations chronic leag pain and swelling, difficulty walking, impaired functional mobility and transfers, difficulty fitting street shoes and LB clothing, unable to reach feet to inspect skin, perform skin and  nail care, and to isnspect skin; decreased activity tolerance, including IADLs and social activities, requiring standing, walking, and / or prolonged sitting, fall risk, increased infection risk ( cellulitis)   Patient Stated Goals reduce swelling  so I can move better and do more   Currently in Pain? Yes  arthritis pain unchanged   Pain Location Leg   Pain Orientation Right;Left   Pain Descriptors / Indicators Aching;Sore   Pain Frequency Intermittent                      OT Treatments/Exercises (OP) - 03/24/17 0001      ADLs   ADL Education Given Yes     Manual Therapy   Manual Therapy Edema management;Manual Lymphatic Drainage (MLD);Compression Bandaging   Manual therapy comments skin care to LLE w/ low pH castor oil during MLD. Telfa  placed over openings on skin on anterior L Leg   Edema Management mild lymphorrhea from one distal posteri RLE observed during CDT to LLE.   Manual Lymphatic Drainage (MLD) Manual lymph drainage (MLD) to LLE in supine utilizing functional inguinal lymph nodes and deep abdominal lymphatics as is customary for non-cancer related lower extremity LE, including bilateral "short neck" sequence, J strokes to sub and supraclavicular LN, deep abdominal pathways, functional inguinal LN, lower extremity proximal to distal w/ emphasis on medial knee bottleneck and politeal LN. Performed fibrosis technique to B maleoli and distal  legs to address fatty  fibrosis.   Compression Bandaging Ankle to below Knee (A-D)  LLE gradient compression wraps applied from foot to below knee as follows: Toe wrap omitted as not indicated. . Knee length cotton stockinett from base of toes to knee. One roll Rasidol soft foam. cumferentially from foot to tibila tuberosity w/ ~ 50% overlap; single 8 cm x 5 m  to foot and ankle, one 12 cm  x 5 m ankle to achilles origin...all applied circumferentially in custommary layered gradient configuration. Fitted w/ 3 x non-slip sock as Pt  able to fit shoe over wraps.                 OT Education - 03/24/17 1704    Education provided Yes   Education Details Continued skilled Pt/caregiver education  And LE ADL training throughout visit for lymphedema self care/ home program, including compression wrapping, compression garment and device wear/care, lymphatic pumping ther ex, simple self-MLD, and skin care. Discussed progress towards goals.   Person(s) Educated Patient   Methods Explanation;Demonstration   Comprehension Verbalized understanding;Returned demonstration             OT Long Term Goals - 01/21/17 1721      OT LONG TERM GOAL #1   Title Pt independent w/ lymphedema precautions and prevention principals and strategies to limit LE progression and infection risk.   Baseline dependent   Time 2   Period Weeks   Status New   Target Date --  2 weeks s/p initial Rx visit     OT LONG TERM GOAL #2   Title Lymphedema (LE) management/ self-care: Pt able to apply knee length, multi layered, gradient compression wraps with modified independence ( extra time and modified positioning)  using proper techniques within 2 weeks to achieve optimal limb volume reductions bilaterally.   Baseline dependent   Time 2   Period Weeks   Status New   Target Date --  2 weeks s/p initial Rx visit     OT LONG TERM GOAL #3   Title Lymphedema (LE) management/ self-care:  Pt to achieve at least 10% limb volume reduction  on L below the knee and 5 % reduction on R below the knee during Intensive Phase CDT to limit progression, to reduce leg pain, to improve ADLs performance, and to facilitate safe functional mobility and ambulation.   Baseline dependent   Time 12   Period Weeks   Status New   Target Date 04/20/17     OT LONG TERM GOAL #4   Title LE management/ self care: Pt able to don and doff appropriate  BLE compression garments using assistive devices within 1 week of issue date with modified independence.   Baseline  dependent   Time 12   Period Weeks   Status New   Target Date 04/20/17     OT LONG TERM GOAL #5   Title Lymphedema (LE) management/ self-care: During Management Phase CDT Pt to sremain infection free and ustain current limb volumes within 5%, and all other clinical gains achieved during OT treatment with needed level of caregiver assistance to limit LE progression, infection risk and further functional decline.   Baseline dependent   Time 6   Period Months   Status New   Target Date 07/19/17               Plan - 03/24/17 1707    Clinical Impression Statement RLE redness has nearly resolved. Slight rash noted at anterior legs only today. Re-Commenced  MLD to LLE as Pt has been taking oral antibiotioc for > 72 hrs since cellulitis dx. LLE is well controlled today and Pt denies pain. Cont 2-3 x weekly as per POC in effort to decrease swelling in prep for LLE knee arthroplasty.   Occupational performance deficits (Please refer to evaluation for details): ADL's;IADL's;Rest and Sleep;Work;Leisure;Social Participation   Rehab Potential Good   OT Frequency 3x / week   OT Duration 12 weeks   OT Treatment/Interventions Self-care/ADL training;Therapeutic exercise;Patient/family education;Manual Therapy;Manual lymph drainage;Other (comment);DME and/or AE instruction;Compression bandaging;Therapeutic activities  skin care w/ low ph castor oil and/ or Eucerin lotion   Consulted and Agree with Plan of Care Patient      Patient will benefit from skilled therapeutic intervention in order to improve the following deficits and impairments:  Abnormal gait, Decreased skin integrity, Decreased knowledge of precautions, Decreased activity tolerance, Decreased knowledge of use of DME, Impaired flexibility, Decreased balance, Decreased mobility, Difficulty walking, Increased edema, Pain  Visit Diagnosis: Lymphedema, not elsewhere classified    Problem List There are no active problems to display for  this patient.   Andrey Spearman, MS, OTR/L, CLT-[facility]NA 03/24/17 5:11 PM  [place] MAIN [facility] SERVICES [address] [place], [place], [address] Phone: [phone]   Fax:  [phone]  Name: [name] MRN: [id number] Date of Birth: Aug 03, 1940

## 2017-03-26 ENCOUNTER — Ambulatory Visit: Payer: Medicare Other | Admitting: Occupational Therapy

## 2017-03-26 DIAGNOSIS — I890 Lymphedema, not elsewhere classified: Secondary | ICD-10-CM | POA: Diagnosis not present

## 2017-03-26 LAB — TYPE AND SCREEN
ABO/RH(D): O POS
ANTIBODY SCREEN: POSITIVE
UNIT DIVISION: 0
Unit division: 0

## 2017-03-26 LAB — BPAM RBC
BLOOD PRODUCT EXPIRATION DATE: 201810172359
BLOOD PRODUCT EXPIRATION DATE: 201810172359
Unit Type and Rh: 5100
Unit Type and Rh: 5100

## 2017-03-26 NOTE — Therapy (Signed)
[place] MAIN [facility] SERVICES [address] [place], [place], [address] Phone: [phone]   Fax:  [phone]  Occupational Therapy Treatment  Patient Details  Name: [name] MRN: [id number] Date of Birth: 30-May-1941 Referring Provider: Isaias Cowman, MD  Encounter Date: 03/26/2017      OT End of Session - 03/26/17 1205    Visit Number 10   Number of Visits 36   Date for OT Re-Evaluation 04/20/17   OT Start Time 1145   OT Stop Time 1200   OT Time Calculation (min) 15 min   Activity Tolerance Patient tolerated treatment well;Other (comment);No increased pain  limited by decreased bed mobility, difficulty walking, and impaired transfers.   Behavior During Therapy [facility] for tasks assessed/performed      Past Medical History:  Diagnosis Date  . Arthritis    knees  . Cancer ([place]) 1990,s   left breast  . Hypercholesterolemia   . Hypertension   . Lymphedema of leg    left leg worse than right leg.  . Pre-diabetes     Past Surgical History:  Procedure Laterality Date  . BREAST SURGERY Left 1990   lumpectomy  . BUNIONECTOMY Bilateral 1990's   plus hammer toe repair on left foot.    There were no vitals filed for this visit.      Subjective Assessment - 03/26/17 1203    Subjective  Pt presents for OT visit 10/36 to for follow along for chronic, progressive, BLE Lymphedema, Pt arrives late due to earlier medical appointment. Pt and OT planned abbreviated visit for compression wrapping only.   Pertinent History HTN, diet controlled type 2 diabetes, stage III, chronic kidney disease, hx urinary incontinence, GERD, OA ( s/p intra articular injections)   Limitations chronic leag pain and swelling, difficulty walking, impaired functional mobility and transfers, difficulty fitting street shoes and LB clothing, unable to reach feet to inspect skin, perform skin and nail care, and to isnspect skin; decreased activity tolerance,  including IADLs and social activities, requiring standing, walking, and / or prolonged sitting, fall risk, increased infection risk ( cellulitis)   Patient Stated Goals reduce swelling  so I can move better and do more   Currently in Pain? No/denies                      OT Treatments/Exercises (OP) - 03/26/17 0001      Manual Therapy   Manual Therapy Edema management;Compression Bandaging   Compression Bandaging Ankle to below Knee (A-D)  LLE gradient compression wraps applied from foot to below knee as follows: Toe wrap omitted as not indicated. . Knee length cotton stockinett from base of toes to knee. One roll Rasidol soft foam. cumferentially from foot to tibila tuberosity w/ ~ 50% overlap; single 8 cm x 5 m  to foot and ankle, one 12 cm  x 5 m ankle to achilles origin...all applied circumferentially in custommary layered gradient configuration. Fitted w/ 3 x non-slip sock as Pt able to fit shoe over wraps.                 OT Education - 03/26/17 1204    Education provided No             OT Long Term Goals - 03/26/17 1207      OT LONG TERM GOAL #1   Title Pt independent w/ lymphedema precautions and prevention principals and strategies to limit LE progression and infection risk.  Baseline dependent   Time 2   Period Weeks   Status Achieved     OT LONG TERM GOAL #2   Title Lymphedema (LE) management/ self-care: Pt able to apply knee length, multi layered, gradient compression wraps with modified independence ( extra time and modified positioning)  using proper techniques within 2 weeks to achieve optimal limb volume reductions bilaterally.   Baseline dependent   Time 2   Period Weeks   Status Partially Met     OT LONG TERM GOAL #3   Title Lymphedema (LE) management/ self-care:  Pt to achieve at least 10% limb volume reduction  on L below the knee and 5 % reduction on R below the knee during Intensive Phase CDT to limit progression, to reduce leg  pain, to improve ADLs performance, and to facilitate safe functional mobility and ambulation.   Baseline dependent   Time 12   Period Weeks   Status Partially Met     OT LONG TERM GOAL #4   Title LE management/ self care: Pt able to don and doff appropriate  BLE compression garments using assistive devices within 1 week of issue date with modified independence.   Baseline dependent   Time 12   Period Weeks   Status On-going     OT LONG TERM GOAL #5   Title Lymphedema (LE) management/ self-care: During Management Phase CDT Pt to sremain infection free and ustain current limb volumes within 5%, and all other clinical gains achieved during OT treatment with needed level of caregiver assistance to limit LE progression, infection risk and further functional decline.   Baseline dependent   Time 6   Period Months   Status On-going               Plan - 04/24/2017 1205    Clinical Impression Statement Apt abbreviated today due to time constraints posed by earlier medical apt. Applied compression wraps only  during this session. LLE presents with obvious limb volume reduction today. Pt demonstrated excellent progress towards all LE self care goals. Cont as per POC.   Occupational performance deficits (Please refer to evaluation for details): ADL's;IADL's;Rest and Sleep;Work;Leisure;Social Participation   Rehab Potential Good   OT Frequency 3x / week   OT Duration 12 weeks   OT Treatment/Interventions Self-care/ADL training;Therapeutic exercise;Patient/family education;Manual Therapy;Manual lymph drainage;Other (comment);DME and/or AE instruction;Compression bandaging;Therapeutic activities  skin care w/ low ph castor oil and/ or Eucerin lotion   Consulted and Agree with Plan of Care Patient      Patient will benefit from skilled therapeutic intervention in order to improve the following deficits and impairments:  Abnormal gait, Decreased skin integrity, Decreased knowledge of  precautions, Decreased activity tolerance, Decreased knowledge of use of DME, Impaired flexibility, Decreased balance, Decreased mobility, Difficulty walking, Increased edema, Pain  Visit Diagnosis: Lymphedema, not elsewhere classified      G-Codes - 24-Apr-2017 1206-09-22    Functional Limitation Self care   Self Care Current Status (M0867) At least 60 percent but less than 80 percent impaired, limited or restricted   Self Care Discharge Status [phone]) At least 1 percent but less than 20 percent impaired, limited or restricted      Problem List There are no active problems to display for this patient.  Andrey Spearman, MS, OTR/L, CLT-LANA 24-Apr-2017 12:10 PM   [place] MAIN [facility] SERVICES [address] [place], [place], [address] Phone: [phone]   Fax:  [phone]  Name: [name]  MRN: [id number] Date of Birth: [birth year]

## 2017-03-31 ENCOUNTER — Ambulatory Visit (INDEPENDENT_AMBULATORY_CARE_PROVIDER_SITE_OTHER): Payer: Medicare Other | Admitting: Vascular Surgery

## 2017-03-31 ENCOUNTER — Ambulatory Visit: Payer: Medicare Other | Attending: Cardiology | Admitting: Occupational Therapy

## 2017-03-31 ENCOUNTER — Encounter (INDEPENDENT_AMBULATORY_CARE_PROVIDER_SITE_OTHER): Payer: Self-pay | Admitting: Vascular Surgery

## 2017-03-31 VITALS — BP 156/78 | HR 68 | Resp 16 | Ht 69.0 in | Wt 233.8 lb

## 2017-03-31 DIAGNOSIS — I890 Lymphedema, not elsewhere classified: Secondary | ICD-10-CM | POA: Insufficient documentation

## 2017-03-31 DIAGNOSIS — R600 Localized edema: Secondary | ICD-10-CM | POA: Insufficient documentation

## 2017-03-31 DIAGNOSIS — E785 Hyperlipidemia, unspecified: Secondary | ICD-10-CM | POA: Insufficient documentation

## 2017-03-31 DIAGNOSIS — I10 Essential (primary) hypertension: Secondary | ICD-10-CM

## 2017-03-31 NOTE — Therapy (Signed)
[place] MAIN [facility] SERVICES [address] [place], [place], [address] Phone: [phone]   Fax:  [phone]  Occupational Therapy Treatment  Patient Details  Name: [name] MRN: [id number] Date of Birth: [birth year] Referring Provider: Isaias Cowman, MD  Encounter Date: 03/31/2017      OT End of Session - 03/31/17 1548    Visit Number 11   Number of Visits 36   Date for OT Re-Evaluation 04/20/17   OT Start Time 0215   OT Stop Time 0310   OT Time Calculation (min) 55 min   Activity Tolerance Patient tolerated treatment well;Other (comment);No increased pain  limited by decreased bed mobility, difficulty walking, and impaired transfers.   Behavior During Therapy [facility] for tasks assessed/performed      Past Medical History:  Diagnosis Date  . Arthritis    knees  . Cancer ([place]) 1990,s   left breast  . Hypercholesterolemia   . Hypertension   . Lymphedema of leg    left leg worse than right leg.  . Pre-diabetes     Past Surgical History:  Procedure Laterality Date  . BREAST SURGERY Left 1990   lumpectomy  . BUNIONECTOMY Bilateral 1990's   plus hammer toe repair on left foot.    There were no vitals filed for this visit.      Subjective Assessment - 03/31/17 1545    Subjective  Pt presents for OT visit 11/36 to for follow along for chronic, progressive, BLE Lymphedema, Pt presents without compression wraps in place. Pt reports her legs are so much better and have been stable since she removed wraps this past Friday afterour visit on Thursday.   Pertinent History HTN, diet controlled type 2 diabetes, stage III, chronic kidney disease, hx urinary incontinence, GERD, OA ( s/p intra articular injections)   Limitations chronic leag pain and swelling, difficulty walking, impaired functional mobility and transfers, difficulty fitting street shoes and LB clothing, unable to reach feet to inspect skin, perform skin and nail  care, and to isnspect skin; decreased activity tolerance, including IADLs and social activities, requiring standing, walking, and / or prolonged sitting, fall risk, increased infection risk ( cellulitis)   Patient Stated Goals reduce swelling  so I can move better and do more   Currently in Pain? No/denies                              OT Education - 03/31/17 1547    Education provided Yes   Education Details Continued skilled Pt/caregiver education  And LE ADL training throughout visit for lymphedema self care/ home program, including compression wrapping, compression garment and device wear/care, lymphatic pumping ther ex, simple self-MLD, and skin care. Discussed progress towards goals.   Person(s) Educated Patient   Methods Explanation;Demonstration   Comprehension Verbalized understanding;Returned demonstration             OT Long Term Goals - 03/26/17 1207      OT LONG TERM GOAL #1   Title Pt independent w/ lymphedema precautions and prevention principals and strategies to limit LE progression and infection risk.   Baseline dependent   Time 2   Period Weeks   Status Achieved     OT LONG TERM GOAL #2   Title Lymphedema (LE) management/ self-care: Pt able to apply knee length, multi layered, gradient compression wraps with modified independence ( extra time and modified positioning)  using proper techniques  within 2 weeks to achieve optimal limb volume reductions bilaterally.   Baseline dependent   Time 2   Period Weeks   Status Partially Met     OT LONG TERM GOAL #3   Title Lymphedema (LE) management/ self-care:  Pt to achieve at least 10% limb volume reduction  on L below the knee and 5 % reduction on R below the knee during Intensive Phase CDT to limit progression, to reduce leg pain, to improve ADLs performance, and to facilitate safe functional mobility and ambulation.   Baseline dependent   Time 12   Period Weeks   Status Partially Met     OT  LONG TERM GOAL #4   Title LE management/ self care: Pt able to don and doff appropriate  BLE compression garments using assistive devices within 1 week of issue date with modified independence.   Baseline dependent   Time 12   Period Weeks   Status On-going     OT LONG TERM GOAL #5   Title Lymphedema (LE) management/ self-care: During Management Phase CDT Pt to sremain infection free and ustain current limb volumes within 5%, and all other clinical gains achieved during OT treatment with needed level of caregiver assistance to limit LE progression, infection risk and further functional decline.   Baseline dependent   Time 6   Period Months   Status On-going               Plan - 03/31/17 1548    Clinical Impression Statement BLE presenting with dramatic limb volume reduction today. Pt tells me she has been tolerating wraps without difficulty between visits. She has been utilizing massage feature on her bed nightly, and she attends all OT visits. 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  By end of session Pt abl;e  to perform J stroke, short neck and knee to groin sequences with VC.Marland Kitchen Pt wencouraged to call with questions or concerns      before knee sx on 10/11. We will keep her on wait list in case we can get her in before then. Pt understands  that she will need new referral / medical release to resume LE care after TKA.    Occupational performance deficits (Please refer to evaluation for details): ADL's;IADL's;Rest and Sleep;Work;Leisure;Social Participation;Other  body image   Rehab Potential Good   OT Frequency 3x / week   OT Duration 12 weeks   OT Treatment/Interventions Self-care/ADL training;Therapeutic exercise;Patient/family education;Manual Therapy;Manual lymph drainage;Other (comment);DME and/or AE instruction;Compression  bandaging;Therapeutic activities  skin care w/ low ph castor oil and/ or Eucerin lotion   Consulted and Agree with Plan of Care Patient      Patient will benefit from skilled therapeutic intervention in order to improve the following deficits and impairments:  Abnormal gait, Decreased skin integrity, Decreased knowledge of precautions, Decreased activity tolerance, Decreased knowledge of use of DME, Impaired flexibility, Decreased balance, Decreased mobility, Difficulty walking, Increased edema, Pain  Visit Diagnosis: Lymphedema, not elsewhere classified    Problem List Patient Active Problem List   Diagnosis Date Noted  . Bilateral lower extremity edema 03/31/2017  . Hyperlipidemia 03/31/2017  . Essential hypertension 03/31/2017    Andrey Spearman, MS, OTR/L, [facility] 03/31/17 3:56 PM  [place] MAIN [facility] SERVICES [address] [place], [place], [address]  Phone: [phone]   Fax:  [phone]  Name: LAYLYNN CAMPANELLA MRN: [id number] Date of Birth: 01-15-1941

## 2017-03-31 NOTE — Progress Notes (Signed)
Subjective:    Patient ID: [name], female    DOB: 1941/03/13, 76 y.o.   MRN: [id number] Chief Complaint  Patient presents with  . New Patient (Initial Visit)    lymphedema   Presents as a new patient referred by Dr. Candiss Norse for "lymphedema". Patient endorses a history of bilateral lower extremity edema for more than a year. She has recently been treated with Unna wraps to the left lower extremity for severe edema. She is also receiving services from the lymphedema clinic. The patient underwent a bilateral lower extremity DVT study which was negative. Patient was taken out of left lower extremity Unna wrap this past Friday. Patient states a discomfort associated with the edema. She notices the swelling is worse towards the end of the day. Patient denies any rest pain or ulceration to the lower extremity. Patient does experience calf pain with ambulation. This calf pain worsens towards the end of the day when the swelling is at its worst. This discomfort has progressive the point she is unable to function on a daily basis. At this time, the patient does not wear medical grade 1 compression stockings or elevate her legs heart level or higher.   Review of Systems  Constitutional: Negative.   HENT: Negative.   Eyes: Negative.   Respiratory: Negative.   Cardiovascular: Positive for leg swelling.  Gastrointestinal: Negative.   Endocrine: Negative.   Genitourinary: Negative.   Musculoskeletal: Negative.   Skin: Negative.   Allergic/Immunologic: Negative.   Neurological: Negative.   Hematological: Negative.   Psychiatric/Behavioral: Negative.       Objective:   Physical Exam  Constitutional: She is oriented to person, place, and time. She appears well-developed and well-nourished. No distress.  HENT:  Head: Normocephalic and atraumatic.  Eyes: Pupils are equal, round, and reactive to light. Conjunctivae are normal.  Neck: Normal range of motion.  Cardiovascular: Normal rate,  regular rhythm, normal heart sounds and intact distal pulses.   Pulses:      Radial pulses are 2+ on the right side, and 2+ on the left side.  Hard to palpate pedal pulses due to edema and body habitus however her bilateral feet are warm  Pulmonary/Chest: Effort normal.  Musculoskeletal: Normal range of motion. She exhibits edema (Moderate swelling primarily located to the ankle in feet bilaterally).  Neurological: She is alert and oriented to person, place, and time.  Skin: Skin is warm and dry. She is not diaphoretic.  Scattered less than 1 cm varicosities noted bilaterally  Psychiatric: She has a normal mood and affect. Her behavior is normal. Judgment and thought content normal.  Vitals reviewed.  BP (!) 156/78 (BP Location: Right Arm)   Pulse 68   Resp 16   Ht 5\' 9"  (1.753 m)   Wt 233 lb 12.8 oz (106.1 kg)   BMI 34.53 kg/m   Past Medical History:  Diagnosis Date  . Arthritis    knees  . Cancer ([place]) 1990,s   left breast  . Hypercholesterolemia   . Hypertension   . Lymphedema of leg    left leg worse than right leg.  . Pre-diabetes    Social History   Social History  . Marital status: Single    Spouse name: N/A  . Number of children: N/A  . Years of education: N/A   Occupational History  . Not on file.   Social History Main Topics  . Smoking status: Never Smoker  . Smokeless tobacco: Never Used  . Alcohol  use Yes     Comment: 1-2 drinks every 1-2 months  . Drug use: No  . Sexual activity: Not on file   Other Topics Concern  . Not on file   Social History Narrative  . No narrative on file   Past Surgical History:  Procedure Laterality Date  . BREAST SURGERY Left 1990   lumpectomy  . BUNIONECTOMY Bilateral 1990's   plus hammer toe repair on left foot.   Family History  Problem Relation Age of Onset  . Stroke Mother    Allergies  Allergen Reactions  . Colesevelam Other (See Comments)    constipation  . Rosuvastatin Other (See Comments)    Pain  in lower extremities      Assessment & Plan:  Presents as a new patient referred by Dr. Candiss Norse for "lymphedema". Patient endorses a history of bilateral lower extremity edema for more than a year. She has recently been treated with Unna wraps to the left lower extremity for severe edema. She is also receiving services from the lymphedema clinic. The patient underwent a bilateral lower extremity DVT study which was negative. Patient was taken out of left lower extremity Unna wrap this past Friday. Patient states a discomfort associated with the edema. She notices the swelling is worse towards the end of the day. Patient denies any rest pain or ulceration to the lower extremity. Patient does experience calf pain with ambulation. This calf pain worsens towards the end of the day when the swelling is at its worst. This discomfort has progressive the point she is unable to function on a daily basis. At this time, the patient does not wear medical grade 1 compression stockings or elevate her legs heart level or higher.  1. Bilateral lower extremity edema - New The patient was encouraged to wear graduated compression stockings (20-30 mmHg) on a daily basis. The patient was instructed to begin wearing the stockings first thing in the morning and removing them in the evening. The patient was instructed specifically not to sleep in the stockings. Prescription given. In addition, behavioral modification including elevation during the day will be initiated. We discussed a lymphedema pump if conventional therapy goes not work. The patient was advised to follow up in one month after wearing her compression stockings daily with elevation. Information on compression stockings, lymphedema and the lymphedema pump was given to the patient. The patient was instructed to call the office in the interim if any worsening edema or ulcerations to the legs, feet or toes occurs. I will also order an ABI as the patient has multiple  risk factors and claudication-like symptoms to rule out any intervening peripheral artery disease The patient expresses their understanding  - VAS [place] LOWER EXTREMITY VENOUS REFLUX; Future  2. Hyperlipidemia, unspecified hyperlipidemia type - Stable Encouraged good control as its slows the progression of atherosclerotic disease  3. Essential hypertension - Stable Encouraged good control as its slows the progression of atherosclerotic disease  Current Outpatient Prescriptions on File Prior to Visit  Medication Sig Dispense Refill  . acetaminophen (TYLENOL) 500 MG tablet Take 500 mg by mouth every 8 (eight) hours as needed.    Marland Kitchen atorvastatin (LIPITOR) 20 MG tablet Take 20 mg by mouth at bedtime.    . docusate sodium (COLACE) 50 MG capsule Take 150 mg by mouth daily as needed for mild constipation.    . gabapentin (NEURONTIN) 300 MG capsule Take 300 mg by mouth at bedtime.    Marland Kitchen losartan (COZAAR) 25 MG  tablet Take 25 mg by mouth every morning.     . magnesium oxide (MAG-OX) 400 MG tablet Take 400 mg by mouth daily.    . Omega-3 Krill Oil 500 MG CAPS Take 500 mg by mouth daily.    Marland Kitchen omeprazole (PRILOSEC) 20 MG capsule Take 20 mg by mouth at bedtime as needed. Acid reflux    . potassium chloride (K-DUR,KLOR-CON) 10 MEQ tablet Take 10 mEq by mouth daily.     . solifenacin (VESICARE) 5 MG tablet Take 5 mg by mouth daily.     . TOPROL XL 25 MG 24 hr tablet Take 25 mg by mouth at bedtime.    . torsemide (DEMADEX) 20 MG tablet Take 20 mg by mouth daily as needed (fluid).    . traMADol (ULTRAM) 50 MG tablet Take 50 mg by mouth 3 (three) times daily.    . VOLTAREN 1 % GEL Apply 1 application topically 3 (three) times daily as needed for pain.     No current facility-administered medications on file prior to visit.     There are no Patient Instructions on file for this visit. No Follow-up on file.   Kairah Leoni A Zaia Carre, PA-C

## 2017-04-03 ENCOUNTER — Ambulatory Visit: Payer: Medicare Other | Admitting: Occupational Therapy

## 2017-04-03 DIAGNOSIS — I890 Lymphedema, not elsewhere classified: Secondary | ICD-10-CM

## 2017-04-03 NOTE — Therapy (Signed)
[place] MAIN [facility] SERVICES [address] [place], [place], [address] Phone: [phone]   Fax:  [phone]  Occupational Therapy Treatment  Patient Details  Name: [name] MRN: [id number] Date of Birth: 08/27/1940 Referring Provider: Isaias Cowman, MD  Encounter Date: 04/03/2017      OT End of Session - 04/03/17 1528    Visit Number 12   Number of Visits 36   Date for OT Re-Evaluation 04/20/17   OT Start Time 0306   OT Stop Time 0405   OT Time Calculation (min) 59 min   Activity Tolerance Patient tolerated treatment well;Other (comment);No increased pain  limited by decreased bed mobility, difficulty walking, and impaired transfers.   Behavior During Therapy [facility] for tasks assessed/performed      Past Medical History:  Diagnosis Date  . Arthritis    knees  . Cancer ([facility]) 1990,s   left breast  . Hypercholesterolemia   . Hypertension   . Lymphedema of leg    left leg worse than right leg.  . Pre-diabetes     Past Surgical History:  Procedure Laterality Date  . BREAST SURGERY Left 1990   lumpectomy  . BUNIONECTOMY Bilateral 1990's   plus hammer toe repair on left foot.    There were no vitals filed for this visit.      Subjective Assessment - 04/03/17 1524    Subjective  Pt presents for OT visit 12/36 to for follow along for chronic, progressive, BLE Lymphedema, Pt presenting for last visit before L knee replacement this coming Tuesday. Pt states she is pleased with results of OT for CDT.   Pertinent History HTN, diet controlled type 2 diabetes, stage III, chronic kidney disease, hx urinary incontinence, GERD, OA ( s/p intra articular injections)   Limitations chronic leag pain and swelling, difficulty walking, impaired functional mobility and transfers, difficulty fitting street shoes and LB clothing, unable to reach feet to inspect skin, perform skin and nail care, and to isnspect skin; decreased activity  tolerance, including IADLs and social activities, requiring standing, walking, and / or prolonged sitting, fall risk, increased infection risk ( cellulitis)   Patient Stated Goals reduce swelling  so I can move better and do more   Currently in Pain? No/denies                      OT Treatments/Exercises (OP) - 04/03/17 0001      ADLs   ADL Education Given Yes     Manual Therapy   Manual Therapy Edema management;Manual Lymphatic Drainage (MLD);Compression Bandaging   Manual therapy comments skin care to LLE w/ low pH castor oil during MLD. Telfa  placed over openings on skin on anterior L Leg   Edema Management no lymphorrhea today   Manual Lymphatic Drainage (MLD) Manual lymph drainage (MLD) to LLE in supine utilizing functional inguinal lymph nodes and deep abdominal lymphatics as is customary for non-cancer related lower extremity LE, including bilateral "short neck" sequence, J strokes to sub and supraclavicular LN, deep abdominal pathways, functional inguinal LN, lower extremity proximal to distal w/ emphasis on medial knee bottleneck and politeal LN. Performed fibrosis technique to B maleoli and distal  legs to address fatty fibrosis.   Compression Bandaging Toes to below Knee (A-D)  LLE gradient compression wraps applied from foot to below knee as follows: Toe wrap omitted as not indicated. . Knee length cotton stockinett from base of toes to knee. One roll Rasidol  soft foam. cumferentially from foot to tibila tuberosity w/ ~ 50% overlap; single 8 cm x 5 m  to foot and ankle, one 12 cm  x 5 m ankle to achilles origin...all applied circumferentially in custommary layered gradient configuration. Fitted w/ 3 x non-slip sock as Pt able to fit shoe over wraps.                 OT Education - 04/03/17 1527    Education provided Yes   Education Details Pt verbalizes understanding of need for new referral to resume OT for CDT should she need it as f/u to TKA. Continued  skilled Pt/caregiver education  And LE ADL training throughout visit for lymphedema self care/ home program, including compression wrapping, compression garment and device wear/care, lymphatic pumping ther ex, simple self-MLD, and skin care. Discussed progress towards goals.   Person(s) Educated Patient   Methods Explanation;Demonstration   Comprehension Verbalized understanding;Returned demonstration             OT Long Term Goals - 03/26/17 1207      OT LONG TERM GOAL #1   Title Pt independent w/ lymphedema precautions and prevention principals and strategies to limit LE progression and infection risk.   Baseline dependent   Time 2   Period Weeks   Status Achieved     OT LONG TERM GOAL #2   Title Lymphedema (LE) management/ self-care: Pt able to apply knee length, multi layered, gradient compression wraps with modified independence ( extra time and modified positioning)  using proper techniques within 2 weeks to achieve optimal limb volume reductions bilaterally.   Baseline dependent   Time 2   Period Weeks   Status Partially Met     OT LONG TERM GOAL #3   Title Lymphedema (LE) management/ self-care:  Pt to achieve at least 10% limb volume reduction  on L below the knee and 5 % reduction on R below the knee during Intensive Phase CDT to limit progression, to reduce leg pain, to improve ADLs performance, and to facilitate safe functional mobility and ambulation.   Baseline dependent   Time 12   Period Weeks   Status Partially Met     OT LONG TERM GOAL #4   Title LE management/ self care: Pt able to don and doff appropriate  BLE compression garments using assistive devices within 1 week of issue date with modified independence.   Baseline dependent   Time 12   Period Weeks   Status On-going     OT LONG TERM GOAL #5   Title Lymphedema (LE) management/ self-care: During Management Phase CDT Pt to sremain infection free and ustain current limb volumes within 5%, and all other  clinical gains achieved during OT treatment with needed level of caregiver assistance to limit LE progression, infection risk and further functional decline.   Baseline dependent   Time 6   Period Months   Status On-going               Plan - 04/03/17 1531    Clinical Impression Statement Pt has made excellent progress towards goals to date. She tolerated all aspects of OT for CDT today without difficulty. Swelling has decreased substantially bilaterall and lymphorrhea has resolved. Infection risk is decreased and leg swelling is well controlled with compression wraps. Pt will require new referral should she need to resume OT after TKA. We are happy to assist her with treducing post surgical swelling PRN.    Occupational performance deficits (  Please refer to evaluation for details): ADL's;IADL's;Rest and Sleep;Work;Leisure;Social Participation;Other  difficulty waling, impaired transfers/ functional mobility   Rehab Potential Good   OT Frequency 3x / week   OT Duration 12 weeks   OT Treatment/Interventions Self-care/ADL training;Therapeutic exercise;Patient/family education;Manual Therapy;Manual lymph drainage;Other (comment);DME and/or AE instruction;Compression bandaging;Therapeutic activities  skin care w/ low ph castor oil and/ or Eucerin lotion   Consulted and Agree with Plan of Care Patient      Patient will benefit from skilled therapeutic intervention in order to improve the following deficits and impairments:  Abnormal gait, Decreased skin integrity, Decreased knowledge of precautions, Decreased activity tolerance, Decreased knowledge of use of DME, Impaired flexibility, Decreased balance, Decreased mobility, Difficulty walking, Increased edema, Pain  Visit Diagnosis: Lymphedema, not elsewhere classified    Problem List Patient Active Problem List   Diagnosis Date Noted  . Bilateral lower extremity edema 03/31/2017  . Hyperlipidemia 03/31/2017  . Essential  hypertension 03/31/2017    Andrey Spearman, MS, OTR/L, [facility] 04/03/17 3:35 PM  [place] MAIN [facility] SERVICES [address] [place], [place], [address] Phone: [phone]   Fax:  [phone]  Name: DEUNDRA FURBER MRN: [id number] Date of Birth: 12-Oct-1940

## 2017-04-07 MED ORDER — CEFAZOLIN SODIUM-DEXTROSE 2-4 GM/100ML-% IV SOLN
2.0000 g | Freq: Once | INTRAVENOUS | Status: AC
  Administered 2017-04-08: 2 g via INTRAVENOUS

## 2017-04-08 ENCOUNTER — Inpatient Hospital Stay: Payer: Medicare Other

## 2017-04-08 ENCOUNTER — Encounter: Payer: Self-pay | Admitting: Anesthesiology

## 2017-04-08 ENCOUNTER — Inpatient Hospital Stay: Payer: Medicare Other | Admitting: Anesthesiology

## 2017-04-08 ENCOUNTER — Encounter
Admission: RE | Disposition: A | Discharge status: Skilled Nursing Facility | Payer: Self-pay | Source: Ambulatory Visit | Attending: Surgery

## 2017-04-08 ENCOUNTER — Inpatient Hospital Stay
Admission: RE | Admit: 2017-04-08 | Discharge: 2017-04-12 | DRG: 470 | Disposition: A | Discharge status: Skilled Nursing Facility | Payer: Medicare Other | Source: Ambulatory Visit | Attending: Surgery | Admitting: Surgery

## 2017-04-08 DIAGNOSIS — Z823 Family history of stroke: Secondary | ICD-10-CM | POA: Diagnosis not present

## 2017-04-08 DIAGNOSIS — Z8249 Family history of ischemic heart disease and other diseases of the circulatory system: Secondary | ICD-10-CM

## 2017-04-08 DIAGNOSIS — M1712 Unilateral primary osteoarthritis, left knee: Secondary | ICD-10-CM | POA: Diagnosis present

## 2017-04-08 DIAGNOSIS — Z888 Allergy status to other drugs, medicaments and biological substances status: Secondary | ICD-10-CM | POA: Diagnosis not present

## 2017-04-08 DIAGNOSIS — E785 Hyperlipidemia, unspecified: Secondary | ICD-10-CM | POA: Diagnosis present

## 2017-04-08 DIAGNOSIS — E559 Vitamin D deficiency, unspecified: Secondary | ICD-10-CM | POA: Diagnosis present

## 2017-04-08 DIAGNOSIS — Z7951 Long term (current) use of inhaled steroids: Secondary | ICD-10-CM | POA: Diagnosis not present

## 2017-04-08 DIAGNOSIS — Z809 Family history of malignant neoplasm, unspecified: Secondary | ICD-10-CM

## 2017-04-08 DIAGNOSIS — Z23 Encounter for immunization: Secondary | ICD-10-CM | POA: Diagnosis present

## 2017-04-08 DIAGNOSIS — R509 Fever, unspecified: Secondary | ICD-10-CM

## 2017-04-08 DIAGNOSIS — E669 Obesity, unspecified: Secondary | ICD-10-CM | POA: Diagnosis present

## 2017-04-08 DIAGNOSIS — I10 Essential (primary) hypertension: Secondary | ICD-10-CM | POA: Diagnosis present

## 2017-04-08 DIAGNOSIS — R52 Pain, unspecified: Secondary | ICD-10-CM

## 2017-04-08 DIAGNOSIS — E7800 Pure hypercholesterolemia, unspecified: Secondary | ICD-10-CM | POA: Diagnosis present

## 2017-04-08 DIAGNOSIS — Z853 Personal history of malignant neoplasm of breast: Secondary | ICD-10-CM

## 2017-04-08 DIAGNOSIS — Z6834 Body mass index (BMI) 34.0-34.9, adult: Secondary | ICD-10-CM | POA: Diagnosis not present

## 2017-04-08 DIAGNOSIS — E119 Type 2 diabetes mellitus without complications: Secondary | ICD-10-CM | POA: Diagnosis present

## 2017-04-08 DIAGNOSIS — Z96652 Presence of left artificial knee joint: Secondary | ICD-10-CM

## 2017-04-08 DIAGNOSIS — M25562 Pain in left knee: Secondary | ICD-10-CM | POA: Diagnosis present

## 2017-04-08 HISTORY — PX: TOTAL KNEE ARTHROPLASTY: SHX125

## 2017-04-08 LAB — GLUCOSE, CAPILLARY: Glucose-Capillary: 83 mg/dL (ref 65–99)

## 2017-04-08 SURGERY — ARTHROPLASTY, KNEE, TOTAL
Anesthesia: Spinal | Site: Knee | Laterality: Left | Wound class: Clean

## 2017-04-08 MED ORDER — MIDAZOLAM HCL 5 MG/5ML IJ SOLN
INTRAMUSCULAR | Status: DC | PRN
  Administered 2017-04-08: 1 mg via INTRAVENOUS

## 2017-04-08 MED ORDER — MAGNESIUM HYDROXIDE 400 MG/5ML PO SUSP
30.0000 mL | Freq: Every day | ORAL | Status: DC | PRN
Start: 2017-04-08 — End: 2017-04-12
  Administered 2017-04-10 – 2017-04-11 (×2): 30 mL via ORAL
  Filled 2017-04-08 (×2): qty 30

## 2017-04-08 MED ORDER — TRANEXAMIC ACID 1000 MG/10ML IV SOLN
INTRAVENOUS | Status: AC
  Filled 2017-04-08: qty 10

## 2017-04-08 MED ORDER — ACETAMINOPHEN 500 MG PO TABS
1000.0000 mg | ORAL_TABLET | Freq: Four times a day (QID) | ORAL | Status: AC
  Administered 2017-04-08 – 2017-04-09 (×4): 1000 mg via ORAL
  Filled 2017-04-08 (×4): qty 2

## 2017-04-08 MED ORDER — SODIUM CHLORIDE 0.9 % IJ SOLN
INTRAMUSCULAR | Status: AC
  Filled 2017-04-08: qty 50

## 2017-04-08 MED ORDER — CEFAZOLIN SODIUM-DEXTROSE 2-4 GM/100ML-% IV SOLN
INTRAVENOUS | Status: AC
  Filled 2017-04-08: qty 100

## 2017-04-08 MED ORDER — OXYCODONE HCL 5 MG PO TABS
5.0000 mg | ORAL_TABLET | ORAL | Status: DC | PRN
  Administered 2017-04-08 – 2017-04-12 (×12): 10 mg via ORAL
  Filled 2017-04-08: qty 1
  Filled 2017-04-08 (×4): qty 2
  Filled 2017-04-08: qty 1
  Filled 2017-04-08 (×6): qty 2
  Filled 2017-04-08: qty 1
  Filled 2017-04-08: qty 2

## 2017-04-08 MED ORDER — PROPOFOL 500 MG/50ML IV EMUL
INTRAVENOUS | Status: DC | PRN
  Administered 2017-04-08: 60 ug/kg/min via INTRAVENOUS

## 2017-04-08 MED ORDER — GABAPENTIN 300 MG PO CAPS
300.0000 mg | ORAL_CAPSULE | Freq: Every day | ORAL | Status: DC
  Administered 2017-04-08 – 2017-04-11 (×4): 300 mg via ORAL
  Filled 2017-04-08 (×4): qty 1

## 2017-04-08 MED ORDER — BUPIVACAINE-EPINEPHRINE (PF) 0.5% -1:200000 IJ SOLN
INTRAMUSCULAR | Status: AC
  Filled 2017-04-08: qty 30

## 2017-04-08 MED ORDER — MAGNESIUM OXIDE 400 (241.3 MG) MG PO TABS
400.0000 mg | ORAL_TABLET | Freq: Every day | ORAL | Status: DC
  Administered 2017-04-09 – 2017-04-12 (×4): 400 mg via ORAL
  Filled 2017-04-08 (×4): qty 1

## 2017-04-08 MED ORDER — PROPOFOL 10 MG/ML IV BOLUS
INTRAVENOUS | Status: AC
  Filled 2017-04-08: qty 20

## 2017-04-08 MED ORDER — DOCUSATE SODIUM 100 MG PO CAPS
100.0000 mg | ORAL_CAPSULE | Freq: Two times a day (BID) | ORAL | Status: DC
  Administered 2017-04-08 – 2017-04-12 (×8): 100 mg via ORAL
  Filled 2017-04-08 (×8): qty 1

## 2017-04-08 MED ORDER — FENTANYL CITRATE (PF) 100 MCG/2ML IJ SOLN: 25.0000 ug | INTRAMUSCULAR | Status: DC | PRN

## 2017-04-08 MED ORDER — BUPIVACAINE-EPINEPHRINE (PF) 0.5% -1:200000 IJ SOLN
INTRAMUSCULAR | Status: DC | PRN
  Administered 2017-04-08: 30 mL via PERINEURAL

## 2017-04-08 MED ORDER — ACETAMINOPHEN 325 MG PO TABS
650.0000 mg | ORAL_TABLET | Freq: Four times a day (QID) | ORAL | Status: DC | PRN
  Administered 2017-04-09 – 2017-04-11 (×4): 650 mg via ORAL
  Filled 2017-04-08 (×4): qty 2

## 2017-04-08 MED ORDER — LOSARTAN POTASSIUM 25 MG PO TABS
25.0000 mg | ORAL_TABLET | Freq: Every day | ORAL | Status: DC
  Administered 2017-04-10 – 2017-04-12 (×3): 25 mg via ORAL
  Filled 2017-04-08 (×4): qty 1

## 2017-04-08 MED ORDER — FLEET ENEMA 7-19 GM/118ML RE ENEM: 1.0000 | ENEMA | Freq: Once | RECTAL | Status: DC | PRN

## 2017-04-08 MED ORDER — ATORVASTATIN CALCIUM 20 MG PO TABS
20.0000 mg | ORAL_TABLET | Freq: Every day | ORAL | Status: DC
  Administered 2017-04-08 – 2017-04-11 (×4): 20 mg via ORAL
  Filled 2017-04-08 (×4): qty 1

## 2017-04-08 MED ORDER — POTASSIUM CHLORIDE IN NACL 20-0.9 MEQ/L-% IV SOLN
INTRAVENOUS | Status: DC
  Administered 2017-04-08: 15:00:00 via INTRAVENOUS
  Filled 2017-04-08 (×10): qty 1000

## 2017-04-08 MED ORDER — METOCLOPRAMIDE HCL 5 MG/ML IJ SOLN: 5.0000 mg | Freq: Three times a day (TID) | INTRAMUSCULAR | Status: DC | PRN

## 2017-04-08 MED ORDER — PHENYLEPHRINE HCL 10 MG/ML IJ SOLN
INTRAMUSCULAR | Status: AC
  Filled 2017-04-08: qty 1

## 2017-04-08 MED ORDER — PROPOFOL 500 MG/50ML IV EMUL
INTRAVENOUS | Status: AC
  Filled 2017-04-08: qty 50

## 2017-04-08 MED ORDER — ONDANSETRON HCL 4 MG/2ML IJ SOLN: 4.0000 mg | Freq: Four times a day (QID) | INTRAMUSCULAR | Status: DC | PRN

## 2017-04-08 MED ORDER — SODIUM CHLORIDE 0.9 % IV SOLN
INTRAVENOUS | Status: DC | PRN
  Administered 2017-04-08: 60 mL

## 2017-04-08 MED ORDER — ACETAMINOPHEN 10 MG/ML IV SOLN
INTRAVENOUS | Status: AC
  Filled 2017-04-08: qty 100

## 2017-04-08 MED ORDER — BISACODYL 10 MG RE SUPP
10.0000 mg | Freq: Every day | RECTAL | Status: DC | PRN
  Administered 2017-04-12: 10 mg via RECTAL
  Filled 2017-04-08: qty 1

## 2017-04-08 MED ORDER — DARIFENACIN HYDROBROMIDE ER 7.5 MG PO TB24
7.5000 mg | ORAL_TABLET | Freq: Every day | ORAL | Status: DC
  Administered 2017-04-10 – 2017-04-12 (×2): 7.5 mg via ORAL
  Filled 2017-04-08 (×5): qty 1

## 2017-04-08 MED ORDER — MIDAZOLAM HCL 2 MG/2ML IJ SOLN
INTRAMUSCULAR | Status: AC
  Filled 2017-04-08: qty 2

## 2017-04-08 MED ORDER — FENTANYL CITRATE (PF) 100 MCG/2ML IJ SOLN
INTRAMUSCULAR | Status: DC | PRN
  Administered 2017-04-08: 50 ug via INTRAVENOUS

## 2017-04-08 MED ORDER — ENOXAPARIN SODIUM 40 MG/0.4ML ~~LOC~~ SOLN
40.0000 mg | SUBCUTANEOUS | Status: DC
  Administered 2017-04-09 – 2017-04-12 (×4): 40 mg via SUBCUTANEOUS
  Filled 2017-04-08 (×4): qty 0.4

## 2017-04-08 MED ORDER — OMEGA-3 KRILL OIL 500 MG PO CAPS: 500.0000 mg | ORAL_CAPSULE | Freq: Every day | ORAL | Status: DC

## 2017-04-08 MED ORDER — GLYCOPYRROLATE 0.2 MG/ML IJ SOLN
INTRAMUSCULAR | Status: DC | PRN
  Administered 2017-04-08: 0.2 mg via INTRAVENOUS

## 2017-04-08 MED ORDER — METOCLOPRAMIDE HCL 10 MG PO TABS: 5.0000 mg | ORAL_TABLET | Freq: Three times a day (TID) | ORAL | Status: DC | PRN

## 2017-04-08 MED ORDER — NEOMYCIN-POLYMYXIN B GU 40-200000 IR SOLN
Status: DC | PRN
  Administered 2017-04-08: 14 mL

## 2017-04-08 MED ORDER — BUPIVACAINE HCL (PF) 0.5 % IJ SOLN
INTRAMUSCULAR | Status: DC | PRN
  Administered 2017-04-08: 3 mL

## 2017-04-08 MED ORDER — KETOROLAC TROMETHAMINE 15 MG/ML IJ SOLN
7.5000 mg | Freq: Four times a day (QID) | INTRAMUSCULAR | Status: AC
  Administered 2017-04-08 – 2017-04-09 (×4): 7.5 mg via INTRAVENOUS
  Filled 2017-04-08 (×4): qty 1

## 2017-04-08 MED ORDER — KETOROLAC TROMETHAMINE 15 MG/ML IJ SOLN
15.0000 mg | Freq: Once | INTRAMUSCULAR | Status: AC
  Administered 2017-04-08: 15 mg via INTRAVENOUS
  Filled 2017-04-08 (×3): qty 1

## 2017-04-08 MED ORDER — PANTOPRAZOLE SODIUM 40 MG PO TBEC
40.0000 mg | DELAYED_RELEASE_TABLET | Freq: Two times a day (BID) | ORAL | Status: DC
  Administered 2017-04-08 – 2017-04-12 (×8): 40 mg via ORAL
  Filled 2017-04-08 (×8): qty 1

## 2017-04-08 MED ORDER — ONDANSETRON HCL 4 MG/2ML IJ SOLN
4.0000 mg | Freq: Once | INTRAMUSCULAR | Status: DC | PRN
Start: 2017-04-08 — End: 2017-04-08

## 2017-04-08 MED ORDER — TRAMADOL HCL 50 MG PO TABS
50.0000 mg | ORAL_TABLET | Freq: Four times a day (QID) | ORAL | Status: DC | PRN
  Administered 2017-04-08: 50 mg via ORAL
  Filled 2017-04-08: qty 1

## 2017-04-08 MED ORDER — DEXTROSE 5 % IV SOLN
3.0000 g | Freq: Four times a day (QID) | INTRAVENOUS | Status: AC
  Administered 2017-04-08 – 2017-04-09 (×3): 3 g via INTRAVENOUS
  Filled 2017-04-08: qty 3000
  Filled 2017-04-08: qty 3
  Filled 2017-04-08 (×3): qty 3000

## 2017-04-08 MED ORDER — SODIUM CHLORIDE 0.9 % IV SOLN
INTRAVENOUS | Status: DC | PRN
  Administered 2017-04-08: 30 ug/min via INTRAVENOUS

## 2017-04-08 MED ORDER — ACETAMINOPHEN 650 MG RE SUPP: 650.0000 mg | Freq: Four times a day (QID) | RECTAL | Status: DC | PRN

## 2017-04-08 MED ORDER — BUPIVACAINE LIPOSOME 1.3 % IJ SUSP
INTRAMUSCULAR | Status: AC
  Filled 2017-04-08: qty 20

## 2017-04-08 MED ORDER — TRANEXAMIC ACID 1000 MG/10ML IV SOLN
INTRAVENOUS | Status: DC | PRN
  Administered 2017-04-08: 1000 mg via INTRAVENOUS

## 2017-04-08 MED ORDER — DIPHENHYDRAMINE HCL 12.5 MG/5ML PO ELIX: 12.5000 mg | ORAL_SOLUTION | ORAL | Status: DC | PRN

## 2017-04-08 MED ORDER — ACETAMINOPHEN 10 MG/ML IV SOLN
INTRAVENOUS | Status: DC | PRN
Start: 2017-04-08 — End: 2017-04-08
  Administered 2017-04-08: 1000 mg via INTRAVENOUS

## 2017-04-08 MED ORDER — PROPOFOL 10 MG/ML IV BOLUS
INTRAVENOUS | Status: DC | PRN
  Administered 2017-04-08 (×4): 20 mg via INTRAVENOUS

## 2017-04-08 MED ORDER — FENTANYL CITRATE (PF) 100 MCG/2ML IJ SOLN
INTRAMUSCULAR | Status: AC
  Filled 2017-04-08: qty 2

## 2017-04-08 MED ORDER — PNEUMOCOCCAL VAC POLYVALENT 25 MCG/0.5ML IJ INJ
0.5000 mL | INJECTION | INTRAMUSCULAR | Status: AC
  Administered 2017-04-09: 0.5 mL via INTRAMUSCULAR
  Filled 2017-04-08: qty 0.5

## 2017-04-08 MED ORDER — SODIUM CHLORIDE 0.9 % IV SOLN
INTRAVENOUS | Status: DC
  Administered 2017-04-08 (×2): via INTRAVENOUS

## 2017-04-08 MED ORDER — KETOROLAC TROMETHAMINE 30 MG/ML IJ SOLN
INTRAMUSCULAR | Status: AC
  Filled 2017-04-08: qty 1

## 2017-04-08 MED ORDER — NEOMYCIN-POLYMYXIN B GU 40-200000 IR SOLN
Status: AC
  Filled 2017-04-08: qty 20

## 2017-04-08 MED ORDER — ONDANSETRON HCL 4 MG PO TABS: 4.0000 mg | ORAL_TABLET | Freq: Four times a day (QID) | ORAL | Status: DC | PRN

## 2017-04-08 MED ORDER — GLYCOPYRROLATE 0.2 MG/ML IJ SOLN
INTRAMUSCULAR | Status: AC
  Filled 2017-04-08: qty 1

## 2017-04-08 MED ORDER — METOPROLOL SUCCINATE ER 25 MG PO TB24
25.0000 mg | ORAL_TABLET | Freq: Every day | ORAL | Status: DC
  Administered 2017-04-08 – 2017-04-11 (×4): 25 mg via ORAL
  Filled 2017-04-08 (×4): qty 1

## 2017-04-08 MED ORDER — SODIUM CHLORIDE 0.9 % IJ SOLN
INTRAMUSCULAR | Status: DC | PRN
  Administered 2017-04-08: 40 mL via INTRAVENOUS

## 2017-04-08 MED ORDER — HYDROMORPHONE HCL 1 MG/ML IJ SOLN: 0.5000 mg | INTRAMUSCULAR | Status: DC | PRN

## 2017-04-08 SURGICAL SUPPLY — 56 items
BANDAGE ELASTIC 6 CLIP ST LF (GAUZE/BANDAGES/DRESSINGS) ×3 IMPLANT
BLADE SAW SAG 25X90X1.19 (BLADE) ×3 IMPLANT
BLADE SURG SZ20 CARB STEEL (BLADE) ×3 IMPLANT
BONE CEMENT PALACOSE (Cement) ×6 IMPLANT
CANISTER SUCT 1200ML W/VALVE (MISCELLANEOUS) ×3 IMPLANT
CANISTER SUCT 3000ML PPV (MISCELLANEOUS) ×3 IMPLANT
CAPT KNEE TOTAL 3 ×3 IMPLANT
CATH TRAY METER 16FR LF (MISCELLANEOUS) ×3 IMPLANT
CEMENT BONE PALACOSE (Cement) ×2 IMPLANT
CHLORAPREP W/TINT 26ML (MISCELLANEOUS) ×3 IMPLANT
COOLER POLAR GLACIER W/PUMP (MISCELLANEOUS) ×3 IMPLANT
COVER MAYO STAND STRL (DRAPES) ×3 IMPLANT
CUFF TOURN 24 STER (MISCELLANEOUS) IMPLANT
CUFF TOURN 30 STER DUAL PORT (MISCELLANEOUS) IMPLANT
DRAPE IMP U-DRAPE 54X76 (DRAPES) ×3 IMPLANT
DRAPE INCISE IOBAN 66X45 STRL (DRAPES) ×3 IMPLANT
DRAPE SHEET LG 3/4 BI-LAMINATE (DRAPES) ×3 IMPLANT
DRSG OPSITE POSTOP 4X12 (GAUZE/BANDAGES/DRESSINGS) ×3 IMPLANT
DRSG OPSITE POSTOP 4X14 (GAUZE/BANDAGES/DRESSINGS) ×3 IMPLANT
ELECT CAUTERY BLADE 6.4 (BLADE) ×3 IMPLANT
ELECT REM PT RETURN 9FT ADLT (ELECTROSURGICAL) ×3
ELECTRODE REM PT RTRN 9FT ADLT (ELECTROSURGICAL) ×1 IMPLANT
GLOVE BIO SURGEON STRL SZ7.5 (GLOVE) ×12 IMPLANT
GLOVE BIO SURGEON STRL SZ8 (GLOVE) ×12 IMPLANT
GLOVE BIOGEL PI IND STRL 8 (GLOVE) ×1 IMPLANT
GLOVE BIOGEL PI INDICATOR 8 (GLOVE) ×2
GLOVE INDICATOR 8.0 STRL GRN (GLOVE) ×3 IMPLANT
GOWN STRL REUS W/ TWL LRG LVL3 (GOWN DISPOSABLE) ×1 IMPLANT
GOWN STRL REUS W/ TWL XL LVL3 (GOWN DISPOSABLE) ×1 IMPLANT
GOWN STRL REUS W/TWL LRG LVL3 (GOWN DISPOSABLE) ×2
GOWN STRL REUS W/TWL XL LVL3 (GOWN DISPOSABLE) ×2
HOLDER FOLEY CATH W/STRAP (MISCELLANEOUS) ×3 IMPLANT
HOOD PEEL AWAY FLYTE STAYCOOL (MISCELLANEOUS) ×9 IMPLANT
IMMBOLIZER KNEE 19 BLUE UNIV (SOFTGOODS) ×3 IMPLANT
KIT RM TURNOVER STRD PROC AR (KITS) ×3 IMPLANT
NDL SAFETY 18GX1.5 (NEEDLE) ×3 IMPLANT
NEEDLE 18GX1X1/2 (RX/OR ONLY) (NEEDLE) ×3 IMPLANT
NEEDLE SPNL 20GX3.5 QUINCKE YW (NEEDLE) ×3 IMPLANT
NS IRRIG 1000ML POUR BTL (IV SOLUTION) ×3 IMPLANT
PACK TOTAL KNEE (MISCELLANEOUS) ×3 IMPLANT
PAD WRAPON POLAR KNEE (MISCELLANEOUS) ×1 IMPLANT
PULSAVAC PLUS IRRIG FAN TIP (DISPOSABLE) ×3
SOL .9 NS 3000ML IRR  AL (IV SOLUTION) ×2
SOL .9 NS 3000ML IRR UROMATIC (IV SOLUTION) ×1 IMPLANT
STAPLER SKIN PROX 35W (STAPLE) ×3 IMPLANT
SUCTION FRAZIER HANDLE 10FR (MISCELLANEOUS) ×2
SUCTION TUBE FRAZIER 10FR DISP (MISCELLANEOUS) ×1 IMPLANT
SUT VIC AB 0 CT1 36 (SUTURE) ×9 IMPLANT
SUT VIC AB 2-0 CT1 27 (SUTURE) ×6
SUT VIC AB 2-0 CT1 TAPERPNT 27 (SUTURE) ×3 IMPLANT
SYR 20CC LL (SYRINGE) ×3 IMPLANT
SYR 30ML LL (SYRINGE) ×9 IMPLANT
SYRINGE 10CC LL (SYRINGE) ×3 IMPLANT
SYSTEM VACUUM CEMENT MIXING (MISCELLANEOUS) ×3 IMPLANT
TIP FAN IRRIG PULSAVAC PLUS (DISPOSABLE) ×1 IMPLANT
WRAPON POLAR PAD KNEE (MISCELLANEOUS) ×3

## 2017-04-08 NOTE — Anesthesia Procedure Notes (Signed)
Spinal  Patient location during procedure: OR Start time: 04/08/2017 8:48 AM End time: 04/08/2017 8:58 AM Staffing Performed: resident/CRNA  Preanesthetic Checklist Completed: patient identified, site marked, surgical consent, pre-op evaluation, timeout performed, IV checked, risks and benefits discussed and monitors and equipment checked Spinal Block Patient position: sitting Prep: Betadine Patient monitoring: heart rate, continuous pulse ox, blood pressure and cardiac monitor Approach: left paramedian Location: L3-4 Injection technique: single-shot Needle Needle type: Introducer and Pencan  Needle gauge: 24 G Needle length: 9 cm Additional Notes Negative paresthesia. Negative blood return. Positive free-flowing CSF. Expiration date of kit checked and confirmed. Patient tolerated procedure well, without complications.

## 2017-04-08 NOTE — Transfer of Care (Signed)
Immediate Anesthesia Transfer of Care Note  Patient: [name]  Procedure(s) Performed: TOTAL KNEE ARTHROPLASTY (Left Knee)  Patient Location: PACU  Anesthesia Type:Spinal  Level of Consciousness: awake, alert , oriented and patient cooperative  Airway & Oxygen Therapy: Patient Spontanous Breathing and Patient connected to nasal cannula oxygen  Post-op Assessment: Report given to RN and Post -op Vital signs reviewed and stable  Post vital signs: Reviewed and stable  Last Vitals:  Vitals:   04/08/17 0816  BP: (!) 150/76  Pulse: 60  Resp: 20  Temp: 36.9 C  SpO2: 100%    Last Pain:  Vitals:   04/08/17 0816  TempSrc: Tympanic  PainSc: 0-No pain      Patients Stated Pain Goal: 0 (45/03/88 8280)  Complications: No apparent anesthesia complications

## 2017-04-08 NOTE — Progress Notes (Signed)
PT Cancellation Note  Patient Details Name: [name] MRN: [id number] DOB: 10-20-1940   Cancelled Treatment:    Reason Eval/Treat Not Completed: Patient not medically ready Attempted X2 this afternoon.  Pt still numb in surgical leg, will hold PT today and eval tomorrow.  Kreg Shropshire, DPT 04/08/2017, 5:19 PM

## 2017-04-08 NOTE — H&P (Signed)
Paper H&P to be scanned into permanent record. H&P reviewed and patient re-examined. No changes. 

## 2017-04-08 NOTE — NC FL2 (Signed)
[place] LEVEL OF CARE SCREENING TOOL     IDENTIFICATION  Patient Name: [name] Birthdate: 1941/03/11 Sex: female Admission Date (Current Location): 04/08/2017  [place] and [place] Number:  Engineering geologist and Address:  [facility], [address] [place], [place] [address]      Provider Number: [id number]  Attending Physician Name and Address:  Corky Mull, MD  Relative Name and Phone Number:       Current Level of Care: Hospital Recommended Level of Care: [place] Prior Approval Number:    Date Approved/Denied:   PASRR Number:   [id number]  Discharge Plan: SNF    Current Diagnoses: Patient Active Problem List   Diagnosis Date Noted  . Status post total knee replacement using cement, left 04/08/2017  . Bilateral lower extremity edema 03/31/2017  . Hyperlipidemia 03/31/2017  . Essential hypertension 03/31/2017    Orientation RESPIRATION BLADDER Height & Weight     Self, Place  Normal Continent Weight: 233 lb (105.7 kg) Height:  5\' 9"  (175.3 cm)  BEHAVIORAL SYMPTOMS/MOOD NEUROLOGICAL BOWEL NUTRITION STATUS      Continent Diet (Clear liquid to be advanced)  AMBULATORY STATUS COMMUNICATION OF NEEDS Skin   Extensive Assist Verbally Surgical wounds (Incision (Closed) Left Knee)                       Personal Care Assistance Level of Assistance  Bathing, Feeding, Dressing Bathing Assistance: Limited assistance Feeding assistance: Independent Dressing Assistance: Limited assistance     Functional Limitations Info  Sight, Hearing, Speech Sight Info: Adequate Hearing Info: Adequate Speech Info: Adequate    SPECIAL CARE FACTORS FREQUENCY  PT (By licensed PT), OT (By licensed OT)     PT Frequency:  (5) OT Frequency:  (5)            Contractures      Additional Factors Info  Code Status, Allergies Code Status Info:  (Full Code) Allergies Info:  (COLESEVELAM, ROSUVASTATIN )            Current Medications (04/08/2017):  This is the current hospital active medication list Current Facility-Administered Medications  Medication Dose Route Frequency Provider Last Rate Last Dose  . 0.9 % NaCl with KCl 20 mEq/ L  infusion   Intravenous Continuous Poggi, Marshall Cork, MD      . acetaminophen (TYLENOL) tablet 650 mg  650 mg Oral Q6H PRN Poggi, Marshall Cork, MD       Or  . acetaminophen (TYLENOL) suppository 650 mg  650 mg Rectal Q6H PRN Poggi, Marshall Cork, MD      . acetaminophen (TYLENOL) tablet 1,000 mg  1,000 mg Oral Q6H Poggi, Marshall Cork, MD      . atorvastatin (LIPITOR) tablet 20 mg  20 mg Oral QHS Poggi, Marshall Cork, MD      . bisacodyl (DULCOLAX) suppository 10 mg  10 mg Rectal Daily PRN Poggi, Marshall Cork, MD      . ceFAZolin (ANCEF) 3 g in dextrose 5 % 50 mL IVPB  3 g Intravenous Q6H Poggi, Marshall Cork, MD      . darifenacin (ENABLEX) 24 hr tablet 7.5 mg  7.5 mg Oral Daily Poggi, Marshall Cork, MD      . diphenhydrAMINE (BENADRYL) 12.5 MG/5ML elixir 12.5-25 mg  12.5-25 mg Oral Q4H PRN Poggi, Marshall Cork, MD      . docusate sodium (COLACE) capsule 100 mg  100 mg Oral BID Poggi,  Marshall Cork, MD      . Derrill Memo ON 04/09/2017] enoxaparin (LOVENOX) injection 40 mg  40 mg Subcutaneous Q24H Poggi, Marshall Cork, MD      . gabapentin (NEURONTIN) capsule 300 mg  300 mg Oral QHS Poggi, Marshall Cork, MD      . HYDROmorphone (DILAUDID) injection 0.5-1 mg  0.5-1 mg Intravenous Q2H PRN Poggi, Marshall Cork, MD      . ketorolac (TORADOL) 15 MG/ML injection 7.5 mg  7.5 mg Intravenous Q6H Poggi, Marshall Cork, MD      . losartan (COZAAR) tablet 25 mg  25 mg Oral Daily Poggi, Marshall Cork, MD      . magnesium hydroxide (MILK OF MAGNESIA) suspension 30 mL  30 mL Oral Daily PRN Poggi, Marshall Cork, MD      . magnesium oxide (MAG-OX) tablet 400 mg  400 mg Oral Daily Poggi, Marshall Cork, MD      . metoCLOPramide (REGLAN) tablet 5-10 mg  5-10 mg Oral Q8H PRN Poggi, Marshall Cork, MD       Or  . metoCLOPramide (REGLAN) injection 5-10 mg  5-10 mg Intravenous Q8H PRN Poggi, Marshall Cork, MD      .  metoprolol succinate (TOPROL-XL) 24 hr tablet 25 mg  25 mg Oral QHS Poggi, Marshall Cork, MD      . ondansetron (ZOFRAN) tablet 4 mg  4 mg Oral Q6H PRN Poggi, Marshall Cork, MD       Or  . ondansetron (ZOFRAN) injection 4 mg  4 mg Intravenous Q6H PRN Poggi, Marshall Cork, MD      . oxyCODONE (Oxy IR/ROXICODONE) immediate release tablet 5-10 mg  5-10 mg Oral Q3H PRN Poggi, Marshall Cork, MD      . pantoprazole (PROTONIX) EC tablet 40 mg  40 mg Oral BID Poggi, Marshall Cork, MD      . sodium phosphate (FLEET) 7-19 GM/118ML enema 1 enema  1 enema Rectal Once PRN Poggi, Marshall Cork, MD      . traMADol Veatrice Bourbon) tablet 50 mg  50 mg Oral Q6H PRN Poggi, Marshall Cork, MD         Discharge Medications: Please see discharge summary for a list of discharge medications.  Relevant Imaging Results:  Relevant Lab Results:   Additional Information  (SSN: [id number])  Smith Mince, Student-Social Work

## 2017-04-08 NOTE — Op Note (Signed)
04/08/2017  11:45 AM  Patient:   [name]  Pre-Op Diagnosis:   Degenerative joint disease, left knee.  Post-Op Diagnosis:   Same  Procedure:   Left TKA using all-cemented Biomet Vanguard system with a 70 mm PCR femur, a 75 mm tibial tray with a 10 mm AS E-poly insert, and a 37 x 10 mm all-poly 3-pegged domed patella.  Surgeon:   Pascal Lux, MD  Assistant:   Cameron Proud, PA-C   Anesthesia:   Spinal  Findings:   As above  Complications:   None  EBL:   15 cc  Fluids:   1200 cc crystalloid  UOP:   800 cc  TT:   120 minutes at 300 mmHg  Drains:   None  Closure:   Staples  Implants:   As above  Brief Clinical Note:   The patient is a 76 year old female with a long history of progressively worsening left knee pain. The patient's symptoms have progressed despite medications, activity modification, injections, etc. The patient's history and examination were consistent with advanced degenerative joint disease of the right knee confirmed by plain radiographs. The patient presents at this time for a left total knee arthroplasty.  Procedure:   The patient was brought into the operating room. After adequate spinal anesthesia was obtained, the patient was lain in the supine position. A Foley catheter was placed by the nurse before the right lower extremity was prepped with ChloraPrep solution and draped sterilely. Preoperative antibiotics were administered. After verifying the proper laterality with a surgical timeout, the limb was exsanguinated with an Esmarch and the tourniquet inflated to 300 mmHg. A standard anterior approach to the knee was made through an approximately 7 inch incision. The incision was carried down through the subcutaneous tissues to expose superficial retinaculum. This was split the length of the incision and the medial flap elevated sufficiently to expose the medial retinaculum. The medial retinaculum was incised, leaving a 3-4 mm cuff of tissue on the  patella. This was extended distally along the medial border of the patellar tendon and proximally through the medial third of the quadriceps tendon. A subtotal fat pad excision was performed before the soft tissues were elevated off the anteromedial and anterolateral aspects of the proximal tibia to the level of the collateral ligaments. The anterior portions of the medial and lateral menisci were removed, as was the anterior cruciate ligament. With the knee flexed to 90, the external tibial guide was positioned and the appropriate proximal tibial cut made. This piece was taken to the back table where it was measured and found to be optimally replicated by a 75 mm component.  Attention was directed to the distal femur. The intramedullary canal was accessed through a 3/8" drill hole. The intramedullary guide was inserted and position in order to obtain a neutral flexion gap. The intercondylar block was positioned with care taken to avoid notching the anterior cortex of the femur. The appropriate cut was made. Next, the distal cutting block was placed at 6 of valgus alignment. Using the 9 mm slot, the distal cut was made. The distal femur was measured and found to be optimally replicated by the 70 mm component. The 70 mm 4-in-1 cutting block was positioned and first the posterior, then the posterior chamfer, the anterior chamfer, femoral and intercondylar cuts were made. At this point, the posterior portions medial and lateral menisci were removed. A trial reduction was performed using the appropriate femoral and tibial components with the 10  mm insert. This demonstrated excellent stability to varus and valgus stressing both in flexion and extension while permitting full extension. Patella tracking was assessed and found to be excellent. Therefore, the tibial guide position was marked on the proximal tibia. The patella thickness was measured and found to be 25 mm. Therefore, the appropriate cut was made. The  patellar surface was measured and found to be optimally replicated by the 37 mm component. The three peg holes were drilled in place before the trial button was inserted. Patella tracking was assessed and found to be excellent, passing the "no thumb test". The lug holes were drilled into the distal femur before the trial component was removed, leaving only the tibial tray. The keel was then created using the appropriate tower, reamer, and punch.  The bony surfaces were prepared for cementing by irrigating thoroughly with bacitracin saline solution. A bone plug was fashioned from some of the bone that had been removed previously and used to plug the distal femoral canal. In addition, 20 cc of Exparel diluted out to 60 cc with normal saline and 30 cc of 0.5% Sensorcaine were injected into the postero-medial and postero-lateral aspects of the knee, the medial and lateral gutter regions, and the peri-incisional tissues to help with postoperative analgesia. Meanwhile, the cement was being mixed on the back table. When it was ready, the tibial tray was cemented in first. The excess cement was removed using [employer] fast streamer. Next, the femoral component was impacted into place. Again, the excess cement was removed using [employer] fast streamer. The 10 mm trial insert was positioned and the knee brought into extension while the cement hardened. Finally, the patella was cemented into place and secured using the patellar clamp. Again, the excess cement was removed using [employer] fast streamer. Once the cement had hardened, the knee was placed through a range of motion with the findings as described above. There was mild anterior-posterior translation, especially in the mid flexion range. Therefore, the trial insert was removed and, after verifying that no cement had been retained posteriorly, the permanent 10 mm anterior stabilized the polyethylene insert was positioned and secured using the appropriate key locking mechanism. Again the knee  was placed through a range of motion with the findings as described above. The anterior-posterior translation was excellent throughout the patient's range of motion.  The wound was copiously irrigated with bacitracin saline solution using the jet lavage system before the quadriceps tendon and retinacular layer were reapproximated using #0 Vicryl interrupted sutures. The superficial retinacular layer also was closed using a running #0 Vicryl suture. A total of 10 cc of transexemic acid (TXA) was injected intra-articularly before the subcutaneous tissues were closed in several layers using 2-0 Vicryl interrupted sutures. The skin was closed using staples. A sterile honeycomb dressing was applied to the skin before the leg was wrapped with an Ace wrap to accommodate the polar pack. The patient was then awakened and returned to the recovery room in satisfactory condition after tolerating the procedure well.

## 2017-04-08 NOTE — Anesthesia Preprocedure Evaluation (Addendum)
Anesthesia Evaluation  Patient identified by MRN, date of birth, ID band Patient awake    Reviewed: Allergy & Precautions, NPO status , Patient's Chart, lab work & pertinent test results  Airway Mallampati: III  TM Distance: >3 FB     Dental  (+) Partial Lower, Chipped, Caps   Pulmonary neg pulmonary ROS,           Cardiovascular hypertension,      Neuro/Psych    GI/Hepatic negative GI ROS, Neg liver ROS,   Endo/Other  diabetes  Renal/GU negative Renal ROS  negative genitourinary   Musculoskeletal  (+) Arthritis , Osteoarthritis,    Abdominal   Peds negative pediatric ROS (+)  Hematology   Anesthesia Other Findings Past Medical History: No date: Arthritis     Comment:  knees 1990,s: Cancer (HCC)     Comment:  left breast No date: Hypercholesterolemia No date: Hypertension No date: Lymphedema of leg     Comment:  left leg worse than right leg. No date: Pre-diabetes  Reproductive/Obstetrics                            Anesthesia Physical Anesthesia Plan  ASA: III  Anesthesia Plan: Spinal   Post-op Pain Management:    Induction: Intravenous  PONV Risk Score and Plan:   Airway Management Planned: Nasal Cannula  Additional Equipment:   Intra-op Plan:   Post-operative Plan:   Informed Consent: I have reviewed the patients History and Physical, chart, labs and discussed the procedure including the risks, benefits and alternatives for the proposed anesthesia with the patient or authorized representative who has indicated his/her understanding and acceptance.   Dental advisory given  Plan Discussed with: CRNA and Surgeon  Anesthesia Plan Comments:         Anesthesia Quick Evaluation

## 2017-04-08 NOTE — Anesthesia Post-op Follow-up Note (Signed)
Anesthesia QCDR form completed.        

## 2017-04-08 NOTE — Progress Notes (Signed)
PHARMACIST - PHYSICIAN ORDER COMMUNICATION  CONCERNING: P&T Medication Policy on Herbal Medications  DESCRIPTION:  This patient's order for:  [employer]  has been noted.  This product(s) is classified as an "herbal" or natural product. Due to a lack of definitive safety studies or FDA approval, nonstandard manufacturing practices, plus the potential risk of unknown drug-drug interactions while on inpatient medications, the Pharmacy and Therapeutics Committee does not permit the use of "herbal" or natural products of this type within [facility].   ACTION TAKEN: The pharmacy department is unable to verify this order at this time  Please reevaluate patient's clinical condition at discharge and address if the herbal or natural product(s) should be resumed at that time.

## 2017-04-09 ENCOUNTER — Encounter: Payer: Self-pay | Admitting: Surgery

## 2017-04-09 ENCOUNTER — Encounter
Admission: RE | Admit: 2017-04-09 | Discharge: 2017-04-09 | Disposition: A | Discharge status: Home / Self Care | Payer: Medicare Other | Source: Ambulatory Visit | Attending: Internal Medicine | Admitting: Internal Medicine

## 2017-04-09 LAB — CBC WITH DIFFERENTIAL/PLATELET
Basophils Absolute: 0 10*3/uL (ref 0–0.1)
Basophils Relative: 0 %
EOS ABS: 0.2 10*3/uL (ref 0–0.7)
Eosinophils Relative: 2 %
HEMATOCRIT: 33.4 % — AB (ref 35.0–47.0)
HEMOGLOBIN: 11.5 g/dL — AB (ref 12.0–16.0)
LYMPHS ABS: 1.2 10*3/uL (ref 1.0–3.6)
LYMPHS PCT: 15 %
MCH: 30.7 pg (ref 26.0–34.0)
MCHC: 34.3 g/dL (ref 32.0–36.0)
MCV: 89.6 fL (ref 80.0–100.0)
MONOS PCT: 10 %
Monocytes Absolute: 0.8 10*3/uL (ref 0.2–0.9)
NEUTROS PCT: 73 %
Neutro Abs: 6 10*3/uL (ref 1.4–6.5)
Platelets: 153 10*3/uL (ref 150–440)
RBC: 3.73 MIL/uL — AB (ref 3.80–5.20)
RDW: 13.5 % (ref 11.5–14.5)
WBC: 8.2 10*3/uL (ref 3.6–11.0)

## 2017-04-09 LAB — BASIC METABOLIC PANEL
Anion gap: 3 — ABNORMAL LOW (ref 5–15)
BUN: 14 mg/dL (ref 6–20)
CHLORIDE: 111 mmol/L (ref 101–111)
CO2: 25 mmol/L (ref 22–32)
CREATININE: 0.86 mg/dL (ref 0.44–1.00)
Calcium: 8.7 mg/dL — ABNORMAL LOW (ref 8.9–10.3)
GFR calc Af Amer: >60 mL/min (ref >60)
GFR calc non Af Amer: >60 mL/min (ref >60)
GLUCOSE: 102 mg/dL — AB (ref 65–99)
POTASSIUM: 4.1 mmol/L (ref 3.5–5.1)
SODIUM: 139 mmol/L (ref 135–145)

## 2017-04-09 NOTE — Evaluation (Addendum)
Occupational Therapy Evaluation Patient Details Name: [name] MRN: [id number] DOB: April 09, 1941 Today's Date: 04/09/2017    History of Present Illness Pt. is a 76 y.o. female who was admitted to [facility] for a Left TKA.   Clinical Impression   Pt. Is a 76 y.o. Female who was admitted to [facility] for a left TKA. Pt. Resides at home alone, and was independent with ADLs, and IADLs including driving, medication management, and meal preparation. Pt. Presents with weakness, 8/10 pain, and limited mobility which limits her from being able to complete ADLs, and IADLs. Pt. Could benefit from skilled OT services for ADL training, A/E training, UE there. Ex, and pt. Education about DME, and home modification. Pt. education was provided about A/E use for LE ADLs, energy conservation, and work simplification strategies. Pt. Could beneift from SNF level of care upon discharge with follow-up OT services.    Follow Up Recommendations  SNF    Equipment Recommendations  Tub/shower bench    Recommendations for Other Services       Precautions / Restrictions Precautions Precautions: Knee;Fall Precaution Booklet Issued: No Restrictions Weight Bearing Restrictions: Yes Other Position/Activity Restrictions: WBAT             ADL either performed or assessed with clinical judgement   ADL Overall ADL's : Needs assistance/impaired Eating/Feeding: Set up   Grooming: Set up   Upper Body Bathing: Set up   Lower Body Bathing: Moderate assistance;Maximal assistance   Upper Body Dressing : Minimal assistance;Set up   Lower Body Dressing: Moderate assistance;Maximal assistance               Functional mobility during ADLs: Moderate assistance General ADL Comments: Pt. education was provided about A/E use for LE ADLs, DME, and work simplification techniques. Pt. was provided with a visual handout.     Vision Baseline Vision/History: Wears glasses Wears Glasses: Reading only        Perception     Praxis      Pertinent Vitals/Pain Pain Assessment: 0-10 Pain Score: 8  Pain Location: Left knee Pain Intervention(s): Limited activity within patient's tolerance;Monitored during session;Premedicated before session;Repositioned;Ice applied     Hand Dominance Right   Extremity/Trunk Assessment Upper Extremity Assessment Upper Extremity Assessment: Generalized weakness (Pt. reports weakness following recent cellulitis)     Cervical / Trunk Assessment Cervical / Trunk Assessment: Normal   Communication Communication Communication: No difficulties   Cognition Arousal/Alertness: Awake/alert Behavior During Therapy: WFL for tasks assessed/performed Overall Cognitive Status: Within Functional Limits for tasks assessed                                     General Comments       Exercises   Shoulder Instructions      Home Living Family/patient expects to be discharged to:: Private residence Living Arrangements: Alone   Type of Home: House Home Access: Stairs to enter [employer] of Steps: 1 step, landing, 2 steps Entrance Stairs-Rails: Right;Left;None Home Layout: One level     Bathroom Shower/Tub: Walk-in shower;Curtain         Home Equipment: Cane - single point          Prior Functioning/Environment Level of Independence: Independent with assistive device(s)        Comments: Pt amb in home with no assist device, amb in community with SPC. Pt reports being independent with all ADLs, able to drive  OT Problem List: Decreased strength;Decreased range of motion      OT Treatment/Interventions: Self-care/ADL training;Therapeutic exercise;Therapeutic activities;DME and/or AE instruction;Cognitive remediation/compensation;Patient/family education    OT Goals(Current goals can be found in the care plan section) Acute Rehab OT Goals Patient Stated Goal: To regain independence OT Goal Formulation: With  patient Potential to Achieve Goals: Good ADL Goals Pt Will Perform Lower Body Dressing: with modified independence Pt Will Transfer to Toilet: with modified independence  OT Frequency: Min 2X/week   Barriers to D/C:            Co-evaluation              AM-PAC PT "6 Clicks" Daily Activity     Outcome Measure Help from another person eating meals?: None Help from another person taking care of personal grooming?: None Help from another person toileting, which includes using toliet, bedpan, or urinal?: A Lot Help from another person bathing (including washing, rinsing, drying)?: A Lot Help from another person to put on and taking off regular upper body clothing?: A Little Help from another person to put on and taking off regular lower body clothing?: A Lot 6 Click Score: 17   End of Session    Activity Tolerance: Patient limited by fatigue;Patient limited by pain Patient left: in bed;with call bell/phone within reach  OT Visit Diagnosis: Muscle weakness (generalized) (M62.81)                Time: 1610-9604 OT Time Calculation (min): 24 min Charges:  OT General Charges $OT Visit: 1 Visit OT Evaluation $OT Eval Low Complexity: 1 Low G-Codes: OT G-codes **NOT FOR INPATIENT CLASS** Functional Limitation: Self care Self Care Current Status (V4098): At least 60 percent but less than 80 percent impaired, limited or restricted Self Care Discharge Status [phone]): At least 1 percent but less than 20 percent impaired, limited or restricted  Harrel Carina, MS, OTR/L  Harrel Carina, MS, OTR/L 04/09/2017, 3:12 PM

## 2017-04-09 NOTE — Progress Notes (Signed)
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 Pt performs there-ex, bed mobility with supervision, transfers and amb with RW with mod assist. Pt amb a total of 8 feet, requires continuous cuing for proper sequencing. Pt appears apprehensive about WB through LLE during transfers/amb, educated on WBAT status, however motivated to participate in PT. Pt weaned off of 2L oxygen, O2 sats >92%. Pt demonstrates deficits with LE strength, ROM, endurance and functional mobility. Would benefit from further skilled PT to address above deficits and promote optimal return to home. Recommend transition to SNF upon DC from acute hospitalization.     Follow Up Recommendations SNF    Equipment Recommendations  Rolling walker with 5" wheels    Recommendations for Other Services       Precautions / Restrictions Precautions Precautions: Knee;Fall Precaution Booklet Issued: No Restrictions Weight Bearing Restrictions: Yes Other Position/Activity Restrictions: WBAT      Mobility  Bed Mobility Overal bed mobility: Needs Assistance Bed Mobility: Supine to Sit     Supine to sit: Supervision     General bed mobility comments: Pt able to rise to seated EOB, utilizes bed rails, min cues for proper technique  Transfers Overall transfer level: Needs assistance Equipment used: Rolling walker (2 wheeled) Transfers: Sit to/from Stand Sit to Stand: Mod assist         General transfer comment: Cues for proper positioning of hands on RW and bed and to lean forward, requires mod assist to rise to standing, cues to stand upright  Ambulation/Gait Ambulation/Gait assistance: Mod assist Ambulation Distance (Feet): 8 Feet Assistive device: Rolling walker (2 wheeled) Gait  Pattern/deviations: Step-to pattern     General Gait Details: Pt amb with step-to gait, very short steps, requires significant cuing for proper sequencing and technique. Pt appears apprehensive to bear weight through LLE, educated on WBAT status.   Stairs            Wheelchair Mobility    Modified Rankin (Stroke Patients Only)       Balance Overall balance assessment: Needs assistance Sitting-balance support: Feet supported Sitting balance-Leahy Scale: Good Sitting balance - Comments: Pt able to sit at EOB, maintain position, no assist   Standing balance support: Bilateral upper extremity supported Standing balance-Leahy Scale: Fair Standing balance comment: Pt relies heavily on RW for UE support, able to maintain position, appears to fatigue with longer period                             Pertinent Vitals/Pain Pain Assessment: 0-10 Pain Score: 2  Pain Location: L knee Pain Intervention(s): Limited activity within patient's tolerance;Monitored during session;Premedicated before session;Repositioned;Ice applied    Home Living Family/patient expects to be discharged to:: Private residence Living Arrangements: Alone   Type of Home: House Home Access: Stairs to enter Entrance Stairs-Rails: Right;Left;None (grab bar on R for first step, on both sides for last 2 steps) Entrance Stairs-Number of Steps: 1 step, landing, 2 steps Home Layout: One level Home Equipment: Cane - single point      Prior Function Level of Independence: Independent with assistive device(s)         Comments: Pt amb in home with no assist device, amb in community  with SPC. Pt reports being independent with all ADLs, able to drive     Hand Dominance        Extremity/Trunk Assessment   Upper Extremity Assessment Upper Extremity Assessment: Generalized weakness (UE MMT grossly 4/5, 3+/5 for elbow extension)    Lower Extremity Assessment Lower Extremity Assessment: Generalized  weakness (LE MMT grossly 4/5)    Cervical / Trunk Assessment Cervical / Trunk Assessment: Normal  Communication   Communication: No difficulties  Cognition Arousal/Alertness: Awake/alert Behavior During Therapy: WFL for tasks assessed/performed Overall Cognitive Status: Within Functional Limits for tasks assessed                                        General Comments      Exercises Total Joint Exercises Goniometric ROM: L knee AAROM 85 degrees Other Exercises Other Exercises: Ther-ex, 10x LLE, ankle pumps, quad sets, SLRs, hip abd/add, heel slides. Cues for proper technique. No assist.    Assessment/Plan    PT Assessment Patient needs continued PT services  PT Problem List Decreased strength;Decreased range of motion;Decreased activity tolerance;Decreased balance;Decreased mobility;Decreased knowledge of use of DME;Pain       PT Treatment Interventions DME instruction;Gait training;Stair training;Functional mobility training;Therapeutic activities;Therapeutic exercise;Balance training;Patient/family education    PT Goals (Current goals can be found in the Care Plan section)  Acute Rehab PT Goals Patient Stated Goal: "to get back to my activities" PT Goal Formulation: With patient Time For Goal Achievement: 04/23/17 Potential to Achieve Goals: Good    Frequency BID   Barriers to discharge        Co-evaluation               AM-PAC PT "6 Clicks" Daily Activity  Outcome Measure Difficulty turning over in bed (including adjusting bedclothes, sheets and blankets)?: None Difficulty moving from lying on back to sitting on the side of the bed? : None Difficulty sitting down on and standing up from a chair with arms (e.g., wheelchair, bedside commode, etc,.)?: Unable Help needed moving to and from a bed to chair (including a wheelchair)?: A Lot Help needed walking in hospital room?: A Lot Help needed climbing 3-5 steps with a railing? : Total 6 Click  Score: 14    End of Session Equipment Utilized During Treatment: Gait belt Activity Tolerance: Patient tolerated treatment well;Patient limited by fatigue Patient left: in chair;with call bell/phone within reach;with chair alarm set;with nursing/sitter in room;with family/visitor present   PT Visit Diagnosis: Other abnormalities of gait and mobility (R26.89);Unsteadiness on feet (R26.81);Muscle weakness (generalized) (M62.81);Pain Pain - Right/Left: Left Pain - part of body: Knee    Time: 0932-6712 PT Time Calculation (min) (ACUTE ONLY): 40 min   Charges:         PT G Codes:   PT G-Codes **NOT FOR INPATIENT CLASS** Functional Assessment Tool Used: AM-PAC 6 Clicks Basic Mobility Functional Limitation: Mobility: Walking and moving around Mobility: Walking and Moving Around Current Status (W5809): At least 40 percent but less than 60 percent impaired, limited or restricted Mobility: Walking and Moving Around Goal Status [phone]): At least 20 percent but less than 40 percent impaired, limited or restricted    Manfred Arch, SPT  Manfred Arch 04/09/2017, 12:55 PM

## 2017-04-09 NOTE — Clinical Social Work Note (Signed)
Clinical Social Work Assessment  Patient Details  Name: [name] MRN: [id number] Date of Birth: 1941/06/16  Date of referral:  04/09/17               Reason for consult:  Facility Placement                Permission sought to share information with:  Chartered certified accountant granted to share information::  Yes, Verbal Permission Granted  Name::      [place]::   [place]   Relationship::     Contact Information:     Housing/Transportation Living arrangements for the past 2 months:  [place] of Information:  Patient Patient Interpreter Needed:  None Criminal Activity/Legal Involvement Pertinent to Current Situation/Hospitalization:  No - Comment as needed Significant Relationships:  Adult Children, Friend Lives with:  Self Do you feel safe going back to the place where you live?  Yes Need for family participation in patient care:  Yes (Comment)  Care giving concerns:  Patient lives in [place] alone.    Social Worker assessment / plan:  Holiday representative (CSW) received SNF consult. PT is recommending SNF. Per Dr. Roland Rack patient prefers [facility]. CSW met with patient and her friend Marcie Bal was at bedside. Patient was alert and oriented X4 and was sitting up in the chair. CSW introduced self and explained role of CSW department. Patient reported that she has 2 adult children and lives alone in [place]. CSW explained SNF process and that medicare requires a 3 night inpatient qualifying stay at a hospital in order to pay for SNF. Patient was admitted to inpatient on 04/08/17. Patient verbalized her understanding and is agreeable to SNF search in [place]. FL2 complete and faxed out.   CSW presented bed offers to patient and she chose [facility]. Plan is for patient to D/C to [facility] Friday 04/11/17 pending medical clearance. [facility] admissions coordinator at [facility] is aware of above.  CSW will continue to follow and assist as needed.   Employment status:  Retired Forensic scientist:  Medicare PT Recommendations:  [place] / Referral to community resources:  [place]  Patient/Family's Response to care:  Patient is agreeable to go to [facility].   Patient/Family's Understanding of and Emotional Response to Diagnosis, Current Treatment, and Prognosis:  Patient was very pleasant and thanked CSW for assistance.   Emotional Assessment Appearance:  Appears stated age Attitude/Demeanor/Rapport:    Affect (typically observed):  Accepting, Adaptable, Pleasant Orientation:  Oriented to Self, Oriented to Place, Oriented to  Time, Oriented to Situation Alcohol / Substance use:  Not Applicable Psych involvement (Current and /or in the community):  No (Comment)  Discharge Needs  Concerns to be addressed:  Discharge Planning Concerns Readmission within the last 30 days:  No Current discharge risk:  Dependent with Mobility Barriers to Discharge:  Continued Medical Work up   [employer], Veronia Beets, LCSW 04/09/2017, 2:15 PM

## 2017-04-09 NOTE — Clinical Social Work Placement (Signed)
   CLINICAL SOCIAL WORK PLACEMENT  NOTE  Date:  04/09/2017  Patient Details  Name: [name] MRN: [id number] Date of Birth: Jul 07, 1940  Clinical Social Work is seeking post-discharge placement for this patient at the [place] level of care (*CSW will initial, date and re-position this form in  chart as items are completed):  Yes   Patient/family provided with [facility] Work Department's list of facilities offering this level of care within the geographic area requested by the patient (or if unable, by the patient's family).  Yes   Patient/family informed of their freedom to choose among providers that offer the needed level of care, that participate in Medicare, Medicaid or managed care program needed by the patient, have an available bed and are willing to accept the patient.  Yes   Patient/family informed of [facility]'s ownership interest in [facility] and [facility], as well as of the fact that they are under no obligation to receive care at these facilities.  PASRR submitted to EDS on 04/08/17     PASRR number received on 04/08/17     Existing PASRR number confirmed on       FL2 transmitted to all facilities in geographic area requested by pt/family on 04/08/17     FL2 transmitted to all facilities within larger geographic area on       Patient informed that his/her managed care company has contracts with or will negotiate with certain facilities, including the following:        Yes   Patient/family informed of bed offers received.  Patient chooses bed at  [facility] )     Physician recommends and patient chooses bed at      Patient to be transferred to   on  .  Patient to be transferred to facility by       Patient family notified on   of transfer.  Name of family member notified:        PHYSICIAN       Additional Comment:    _______________________________________________ Adreana Coull, Veronia Beets,  LCSW 04/09/2017, 2:14 PM

## 2017-04-09 NOTE — Anesthesia Postprocedure Evaluation (Signed)
Anesthesia Post Note  Patient: [name]  Procedure(s) Performed: TOTAL KNEE ARTHROPLASTY (Left Knee)  Patient location during evaluation: Nursing Unit Anesthesia Type: Spinal Level of consciousness: oriented and awake and alert Pain management: pain level controlled Vital Signs Assessment: post-procedure vital signs reviewed and stable Respiratory status: spontaneous breathing and respiratory function stable Cardiovascular status: blood pressure returned to baseline and stable Postop Assessment: no headache, no backache and no apparent nausea or vomiting Anesthetic complications: no     Last Vitals:  Vitals:   04/08/17 1944 04/08/17 2355  BP: (!) 113/51 (!) 105/47  Pulse: 71 64  Resp: 20 19  Temp: 36.8 C 36.9 C  SpO2: 100% 100%    Last Pain:  Vitals:   04/09/17 0227  TempSrc:   PainSc: 8                  Silvana Newness A

## 2017-04-09 NOTE — Progress Notes (Signed)
Patient IV infiltrated, tryed to put IV in left hand and was unsuccessful. Order put in for IV team, will continue to monitor.

## 2017-04-09 NOTE — Progress Notes (Signed)
Pt tolerated sitting on side of bed. CPM applied.

## 2017-04-09 NOTE — Progress Notes (Signed)
Physical Therapy Treatment Patient Details Name: [name] MRN: [id number] DOB: August 24, 1940 Today's Date: 04/09/2017    History of Present Illness Pt. is a 76 y.o. female who was admitted to [facility] for a Left TKA.    PT Comments    Pt is making good progress towards goals. Pt performed there-ex, reviewed written HEP. Performed sit to supine bed mobility with CGA assist, transfers with RW with mod assist, amb with RW with min assist. Pt amb a total of 40 ft, occasionally requires cues for proper sequencing, amb at a slow steady pace, however appeared to fatigue with further amb. Pt received in room on [facility], pt performed toileting tasks, assisted with transfer to standing. Pt appeared motivated to participate in all PT activities. Continue to progress with there-ex, and functional mobility.    Follow Up Recommendations  SNF     Equipment Recommendations  Rolling walker with 5" wheels    Recommendations for Other Services       Precautions / Restrictions Precautions Precautions: Knee;Fall Precaution Booklet Issued: Yes (comment) Restrictions Weight Bearing Restrictions: Yes Other Position/Activity Restrictions: WBAT    Mobility  Bed Mobility Overal bed mobility: Needs Assistance Bed Mobility: Sit to Supine       Sit to supine: Min guard   General bed mobility comments: pt utilizes bed rails, able to scoot to middle of bed, CGA to assist with LLE placement  Transfers Overall transfer level: Needs assistance Equipment used: Rolling walker (2 wheeled) Transfers: Sit to/from Stand Sit to Stand: Mod assist         General transfer comment: cues for proper positioning of hands, mod assist to rise to standing, requires increased time to stand  Ambulation/Gait Ambulation/Gait assistance: Min assist Ambulation Distance (Feet): 40 Feet Assistive device: Rolling walker (2 wheeled) Gait Pattern/deviations: Step-to pattern     General Gait Details: pt amb with step-to  gait, able to take longer steps, initially requires cues for sequencing, demonstrates carryover. Pt amb slowly, but at a steady pace.    Stairs            Wheelchair Mobility    Modified Rankin (Stroke Patients Only)       Balance Overall balance assessment: Needs assistance Sitting-balance support: Feet supported Sitting balance-Leahy Scale: Good Sitting balance - Comments: Pt able to sit at EOB, maintain position, no assist   Standing balance support: Bilateral upper extremity supported Standing balance-Leahy Scale: Good Standing balance comment: pt relies heavily on RW for UE support, able to maintain position, cues to stand upright                            Cognition Arousal/Alertness: Awake/alert Behavior During Therapy: WFL for tasks assessed/performed Overall Cognitive Status: Within Functional Limits for tasks assessed                                        Exercises Other Exercises Other Exercises: Ther-ex, 12x, LLE ankle pumps, quad sets, SLRs, hip abd/add, SAQs, LAQs. Cues for proper technique. No assist needed. Other Exercises: Pt received in room on [facility], pt able to perform toileting tasks with mod assist to transfer to standing,     General Comments        Pertinent Vitals/Pain Pain Assessment: 0-10 Pain Score: 5  Pain Location: Left knee Pain Intervention(s): Limited activity within patient's tolerance;Monitored  during session;Repositioned;Ice applied    Home Living Family/patient expects to be discharged to:: Private residence Living Arrangements: Alone   Type of Home: House Home Access: Stairs to enter Entrance Stairs-Rails: Right;Left;None Home Layout: One level Home Equipment: Cane - single point      Prior Function Level of Independence: Independent with assistive device(s)          PT Goals (current goals can now be found in the care plan section) Acute Rehab PT Goals Patient Stated Goal: to return to  prior activities PT Goal Formulation: With patient Time For Goal Achievement: 04/23/17 Potential to Achieve Goals: Good Progress towards PT goals: Progressing toward goals    Frequency    BID      PT Plan Current plan remains appropriate    Co-evaluation              AM-PAC PT "6 Clicks" Daily Activity  Outcome Measure  Difficulty turning over in bed (including adjusting bedclothes, sheets and blankets)?: None Difficulty moving from lying on back to sitting on the side of the bed? : None Difficulty sitting down on and standing up from a chair with arms (e.g., wheelchair, bedside commode, etc,.)?: Unable Help needed moving to and from a bed to chair (including a wheelchair)?: A Little Help needed walking in hospital room?: A Little Help needed climbing 3-5 steps with a railing? : A Lot 6 Click Score: 17    End of Session Equipment Utilized During Treatment: Gait belt Activity Tolerance: Patient tolerated treatment well Patient left: in bed;with call bell/phone within reach;with bed alarm set;with nursing/sitter in room;with family/visitor present   PT Visit Diagnosis: Other abnormalities of gait and mobility (R26.89);Muscle weakness (generalized) (M62.81);Pain Pain - Right/Left: Left Pain - part of body: Knee     Time: 1350-1418 PT Time Calculation (min) (ACUTE ONLY): 28 min  Charges:                       G Codes:  Functional Assessment Tool Used: AM-PAC 6 Clicks Basic Mobility Functional Limitation: Mobility: Walking and moving around Mobility: Walking and Moving Around Current Status (V4008): At least 40 percent but less than 60 percent impaired, limited or restricted Mobility: Walking and Moving Around Goal Status [phone]): At least 20 percent but less than 40 percent impaired, limited or restricted    Manfred Arch, SPT   Manfred Arch 04/09/2017, 4:51 PM

## 2017-04-09 NOTE — Progress Notes (Signed)
Subjective: 1 Day Post-Op Procedure(s) (LRB): TOTAL KNEE ARTHROPLASTY (Left) Patient reports pain as mild.   Patient is well, and has had no acute complaints or problems PT and care management to assist with discharge planning. Negative for chest pain and shortness of breath Fever: no Gastrointestinal:Negative for nausea and vomiting  Objective: Vital signs in last 24 hours: Temp:  [97.2 F (36.2 C)-98.5 F (36.9 C)] 98.2 F (36.8 C) (10/10 0751) Pulse Rate:  [56-71] 56 (10/10 0751) Resp:  [14-20] 19 (10/09 2355) BP: (105-154)/(47-74) 112/52 (10/10 0751) SpO2:  [100 %] 100 % (10/10 0751)  Intake/Output from previous day:  Intake/Output Summary (Last 24 hours) at 04/09/17 0953 Last data filed at 04/09/17 0615  Gross per 24 hour  Intake             1200 ml  Output             3415 ml  Net            -2215 ml    Intake/Output this shift: No intake/output data recorded.  Labs:  Recent Labs  04/09/17 0417  HGB 11.5*    Recent Labs  04/09/17 0417  WBC 8.2  RBC 3.73*  HCT 33.4*  PLT 153    Recent Labs  04/09/17 0417  NA 139  K 4.1  CL 111  CO2 25  BUN 14  CREATININE 0.86  GLUCOSE 102*  CALCIUM 8.7*   No results for input(s): LABPT, INR in the last 72 hours.   EXAM General - Patient is Alert, Appropriate and Oriented Extremity - ABD soft Sensation intact distally Intact pulses distally Dorsiflexion/Plantar flexion intact Incision: scant drainage No cellulitis present Dressing/Incision - blood tinged drainage Motor Function - intact, moving foot and toes well on exam.   Abdomen soft on exam, normal BS without tympany.  Past Medical History:  Diagnosis Date  . Arthritis    knees  . Cancer ([place]) 1990,s   left breast  . Hypercholesterolemia   . Hypertension   . Lymphedema of leg    left leg worse than right leg.  . Pre-diabetes     Assessment/Plan: 1 Day Post-Op Procedure(s) (LRB): TOTAL KNEE ARTHROPLASTY (Left) Active Problems:  Status post total knee replacement using cement, left  Estimated body mass index is 34.41 kg/m as calculated from the following:   Height as of this encounter: 5\' 9"  (1.753 m).   Weight as of this encounter: 105.7 kg (233 lb). Advance diet Up with therapy D/C IV fluids when tolerating po intake.  Labs reviewed, Hg 11.5 CBC and BMP ordered for tomorrow morning. Up with therapy today. Begin working on bowel movement.  DVT Prophylaxis - Lovenox, Foot Pumps and TED hose Weight-Bearing as tolerated to left leg  J. Cameron Proud, PA-C [facility] 04/09/2017, 9:53 AM

## 2017-04-10 ENCOUNTER — Inpatient Hospital Stay: Payer: Medicare Other

## 2017-04-10 LAB — URINALYSIS, COMPLETE (UACMP) WITH MICROSCOPIC
BILIRUBIN URINE: NEGATIVE
Bacteria, UA: NONE SEEN
Glucose, UA: NEGATIVE mg/dL
Hgb urine dipstick: NEGATIVE
Ketones, ur: NEGATIVE mg/dL
LEUKOCYTES UA: NEGATIVE
NITRITE: NEGATIVE
PH: 6 (ref 5.0–8.0)
Protein, ur: NEGATIVE mg/dL
SPECIFIC GRAVITY, URINE: 1.008 (ref 1.005–1.030)

## 2017-04-10 LAB — BASIC METABOLIC PANEL
Anion gap: 4 — ABNORMAL LOW (ref 5–15)
BUN: 14 mg/dL (ref 6–20)
CO2: 24 mmol/L (ref 22–32)
CREATININE: 1 mg/dL (ref 0.44–1.00)
Calcium: 9.1 mg/dL (ref 8.9–10.3)
Chloride: 108 mmol/L (ref 101–111)
GFR calc Af Amer: >60 mL/min (ref >60)
GFR, EST NON AFRICAN AMERICAN: 53 mL/min — AB (ref >60)
Glucose, Bld: 120 mg/dL — ABNORMAL HIGH (ref 65–99)
Potassium: 4.1 mmol/L (ref 3.5–5.1)
SODIUM: 136 mmol/L (ref 135–145)

## 2017-04-10 LAB — CBC
HCT: 36.6 % (ref 35.0–47.0)
Hemoglobin: 12.5 g/dL (ref 12.0–16.0)
MCH: 30.6 pg (ref 26.0–34.0)
MCHC: 34.1 g/dL (ref 32.0–36.0)
MCV: 89.7 fL (ref 80.0–100.0)
PLATELETS: 143 10*3/uL — AB (ref 150–440)
RBC: 4.08 MIL/uL (ref 3.80–5.20)
RDW: 13.4 % (ref 11.5–14.5)
WBC: 8.4 10*3/uL (ref 3.6–11.0)

## 2017-04-10 MED ORDER — ENOXAPARIN SODIUM 40 MG/0.4ML ~~LOC~~ SOLN: 40.0000 mg | SUBCUTANEOUS | 0 refills | Status: DC

## 2017-04-10 MED ORDER — OXYCODONE HCL 5 MG PO TABS: 5.0000 mg | ORAL_TABLET | ORAL | 0 refills | Status: DC | PRN

## 2017-04-10 NOTE — Progress Notes (Signed)
Physical Therapy Treatment Patient Details Name: [name] MRN: [id number] DOB: [birth year] Today's Date: 04/10/2017    History of Present Illness Pt. is a 76 y.o. female who was admitted to [facility] for a Left TKA.    PT Comments    Pt is making progress towards goals. Reviewed with pt importance of placing towel roll under L ankle, as pt did not have it while resting in bed. Pt performed there-ex, bed mobility with CGA, transfers with RW with mod assist, amb with RW with min assist. Pt amb a total of 12 ft, very slowly with short steps. Pt's functional mobility appears limited by decreased strength and endurance, appeared to fatigue quickly. Continue to progress with there-ex and mobility activities this afternoon.  Follow Up Recommendations  SNF     Equipment Recommendations  Rolling walker with 5" wheels    Recommendations for Other Services       Precautions / Restrictions Precautions Precautions: Knee;Fall Precaution Booklet Issued: Yes (comment) Restrictions Weight Bearing Restrictions: Yes Other Position/Activity Restrictions: WBAT    Mobility  Bed Mobility Overal bed mobility: Needs Assistance Bed Mobility: Sit to Supine     Supine to sit: Min guard     General bed mobility comments: pt utilizes bed rails, no cues needed, CGA for LLE, requires additional time  Transfers Overall transfer level: Needs assistance Equipment used: Rolling walker (2 wheeled) Transfers: Sit to/from Stand Sit to Stand: Mod assist         General transfer comment: cues for proper positioning and to lean trunk forward, requires significant time to rise to standing, cues to stand up straight  Ambulation/Gait Ambulation/Gait assistance: Min assist Ambulation Distance (Feet): 12 Feet Assistive device: Rolling walker (2 wheeled) Gait Pattern/deviations: Step-to pattern     General Gait Details: step-to gait, amb slowly with short steps, relies heavily on RW for UE  support   Stairs            Wheelchair Mobility    Modified Rankin (Stroke Patients Only)       Balance Overall balance assessment: Needs assistance Sitting-balance support: Feet supported Sitting balance-Leahy Scale: Good Sitting balance - Comments: Pt able to sit at EOB, maintain position, no assist   Standing balance support: Bilateral upper extremity supported Standing balance-Leahy Scale: Good Standing balance comment: cues to stand upright, requires heavily on RW for UE support                            Cognition Arousal/Alertness: Awake/alert Behavior During Therapy: [facility] for tasks assessed/performed Overall Cognitive Status: Within Functional Limits for tasks assessed                                        Exercises Total Joint Exercises Goniometric ROM: L knee AAROM 0-89 degrees Other Exercises Other Exercises: Ther-ex 15x, LLE ankle pumps, quad sets, SLRs, hip abd/add, SAQs, heel slides. Cues for proper technique.     General Comments        Pertinent Vitals/Pain Pain Assessment: 0-10 Pain Score: 4  Pain Location: Left knee Pain Intervention(s): Limited activity within patient's tolerance;Monitored during session;Repositioned;Ice applied    Home Living                      Prior Function  PT Goals (current goals can now be found in the care plan section) Acute Rehab PT Goals Patient Stated Goal: to return to job and prior activities PT Goal Formulation: With patient Time For Goal Achievement: 04/23/17 Potential to Achieve Goals: Good Progress towards PT goals: Progressing toward goals    Frequency    BID      PT Plan Current plan remains appropriate    Co-evaluation              AM-PAC PT "6 Clicks" Daily Activity  Outcome Measure  Difficulty turning over in bed (including adjusting bedclothes, sheets and blankets)?: None Difficulty moving from lying on back to sitting on the  side of the bed? : None Difficulty sitting down on and standing up from a chair with arms (e.g., wheelchair, bedside commode, etc,.)?: Unable Help needed moving to and from a bed to chair (including a wheelchair)?: A Little Help needed walking in hospital room?: A Little Help needed climbing 3-5 steps with a railing? : A Lot 6 Click Score: 17    End of Session Equipment Utilized During Treatment: Gait belt Activity Tolerance: Patient tolerated treatment well Patient left: in chair;with chair alarm set;with call bell/phone within reach   PT Visit Diagnosis: Other abnormalities of gait and mobility (R26.89);Muscle weakness (generalized) (M62.81);Pain Pain - Right/Left: Left Pain - part of body: Knee     Time: 9326-7124 PT Time Calculation (min) (ACUTE ONLY): 38 min  Charges:                       G Codes:  Functional Assessment Tool Used: AM-PAC 6 Clicks Basic Mobility Functional Limitation: Mobility: Walking and moving around Mobility: Walking and Moving Around Current Status (P8099): At least 40 percent but less than 60 percent impaired, limited or restricted Mobility: Walking and Moving Around Goal Status [phone]): At least 20 percent but less than 40 percent impaired, limited or restricted    Manfred Arch, SPT   Manfred Arch 04/10/2017, 12:07 PM

## 2017-04-10 NOTE — Progress Notes (Signed)
Physical Therapy Treatment Patient Details Name: [name] MRN: [id number] DOB: [birth year] Today's Date: 04/10/2017    History of Present Illness Pt. is a 76 y.o. female who was admitted to [facility] for a Left TKA.    PT Comments    Pt able to perform there-ex, bed mobility with CGA, transfers with RW with mod assist and amb with RW with min assist. Pt amb a total of 40 ft, to and from bathroom, able to perform toileting tasks. Pt continues to require continuous cuing for proper sequencing with transfers and amb. Pt appeared limited by fatigue and pain, however was motivated to participate in all PT activities. Will continue to progress with functional mobility.    Follow Up Recommendations  SNF     Equipment Recommendations  Rolling walker with 5" wheels    Recommendations for Other Services       Precautions / Restrictions Precautions Precautions: Knee;Fall Precaution Booklet Issued: Yes (comment) Restrictions Weight Bearing Restrictions: Yes Other Position/Activity Restrictions: WBAT    Mobility  Bed Mobility Overal bed mobility: Needs Assistance Bed Mobility: Sit to Supine     Supine to sit: Min guard     General bed mobility comments: Cues needed for proper technique, CGA to guide LLE onto bed, utilized trapeze to scoot up in bed  Transfers Overall transfer level: Needs assistance Equipment used: Rolling walker (2 wheeled) Transfers: Sit to/from Stand Sit to Stand: Mod assist;+2 safety/equipment         General transfer comment: cues for proper UE/LE positioning and to lean trunk forward, pt requires additional time to rise to standing, +2 assist when transferring from toilet to standing due to weakness/pain  Ambulation/Gait Ambulation/Gait assistance: Min assist Ambulation Distance (Feet): 40 Feet Assistive device: Rolling walker (2 wheeled) Gait Pattern/deviations: Step-to pattern     General Gait Details: step-to gait, takes very short steps,  cues for proper sequencing of RW and LEs, cues to keep RW in front of body    Stairs            Wheelchair Mobility    Modified Rankin (Stroke Patients Only)       Balance Overall balance assessment: Needs assistance Sitting-balance support: Feet supported Sitting balance-Leahy Scale: Good Sitting balance - Comments: No assist   Standing balance support: Bilateral upper extremity supported Standing balance-Leahy Scale: Good Standing balance comment: forward trunk leans, cues to stand upright, relies heavily on RW for support                            Cognition Arousal/Alertness: Awake/alert Behavior During Therapy: [facility] for tasks assessed/performed Overall Cognitive Status: Within Functional Limits for tasks assessed                                        Exercises Total Joint Exercises Goniometric ROM: L knee AAROM 0-89 degrees Other Exercises Other Exercises: Ther-ex 12x, LLE ankle pumps, SLRS, SAQs, hip abd/add, cues and CGA for proper technique Other Exercises: Pt amb to bathroom, able to perform toileting tasks, requires mod assist +2 to transfer to standing from 3-in-1.     General Comments        Pertinent Vitals/Pain Pain Assessment: 0-10 Pain Score: 10-Worst pain ever Pain Location: Left knee Pain Intervention(s): Limited activity within patient's tolerance;Monitored during session;Patient requesting pain meds-RN notified;Repositioned;Ice applied    Home  Living                      Prior Function            PT Goals (current goals can now be found in the care plan section) Acute Rehab PT Goals Patient Stated Goal: to return to job and prior activities PT Goal Formulation: With patient Time For Goal Achievement: 04/23/17 Potential to Achieve Goals: Good Progress towards PT goals: Progressing toward goals    Frequency    BID      PT Plan Current plan remains appropriate    Co-evaluation               AM-PAC PT "6 Clicks" Daily Activity  Outcome Measure  Difficulty turning over in bed (including adjusting bedclothes, sheets and blankets)?: None Difficulty moving from lying on back to sitting on the side of the bed? : None Difficulty sitting down on and standing up from a chair with arms (e.g., wheelchair, bedside commode, etc,.)?: Unable Help needed moving to and from a bed to chair (including a wheelchair)?: A Little Help needed walking in hospital room?: A Little Help needed climbing 3-5 steps with a railing? : A Lot 6 Click Score: 17    End of Session Equipment Utilized During Treatment: Gait belt Activity Tolerance: Patient limited by pain Patient left: in bed;with call bell/phone within reach;with bed alarm set;with family/visitor present   PT Visit Diagnosis: Other abnormalities of gait and mobility (R26.89);Unsteadiness on feet (R26.81);Muscle weakness (generalized) (M62.81);Pain Pain - Right/Left: Left Pain - part of body: Knee     Time: 1287-8676 PT Time Calculation (min) (ACUTE ONLY): 40 min  Charges:  $Gait Training: 23-37 mins $Therapeutic Exercise: 8-22 mins                    G Codes:  Functional Assessment Tool Used: AM-PAC 6 Clicks Basic Mobility Functional Limitation: Mobility: Walking and moving around Mobility: Walking and Moving Around Current Status (H2094): At least 40 percent but less than 60 percent impaired, limited or restricted Mobility: Walking and Moving Around Goal Status [phone]): At least 20 percent but less than 40 percent impaired, limited or restricted    Manfred Arch, SPT  Manfred Arch 04/10/2017, 3:25 PM

## 2017-04-10 NOTE — Progress Notes (Signed)
Subjective: 2 Days Post-Op Procedure(s) (LRB): TOTAL KNEE ARTHROPLASTY (Left) Patient reports pain as mild.   Patient is well, and has had no acute complaints or problems Plan is for discharge to SNF on 04/11/17 Negative for chest pain and shortness of breath Fever: 102.5 last night Gastrointestinal:Negative for nausea and vomiting  Objective: Vital signs in last 24 hours: Temp:  [99.1 F (37.3 C)-102.5 F (39.2 C)] 99.1 F (37.3 C) (10/11 0748) Pulse Rate:  [61-93] 61 (10/11 1059) Resp:  [16-20] 20 (10/11 0748) BP: (107-129)/(44-47) 116/44 (10/11 1059) SpO2:  [96 %-100 %] 98 % (10/11 1059)  Intake/Output from previous day:  Intake/Output Summary (Last 24 hours) at 04/10/17 1141 Last data filed at 04/10/17 1005  Gross per 24 hour  Intake          2251.25 ml  Output                0 ml  Net          2251.25 ml    Intake/Output this shift: Total I/O In: 240 [P.O.:240] Out: -   Labs:  Recent Labs  04/09/17 0417 04/10/17 0343  HGB 11.5* 12.5    Recent Labs  04/09/17 0417 04/10/17 0343  WBC 8.2 8.4  RBC 3.73* 4.08  HCT 33.4* 36.6  PLT 153 143*    Recent Labs  04/09/17 0417 04/10/17 0343  NA 139 136  K 4.1 4.1  CL 111 108  CO2 25 24  BUN 14 14  CREATININE 0.86 1.00  GLUCOSE 102* 120*  CALCIUM 8.7* 9.1   No results for input(s): LABPT, INR in the last 72 hours.   EXAM General - Patient is Alert, Appropriate and Oriented Extremity - ABD soft Sensation intact distally Intact pulses distally Dorsiflexion/Plantar flexion intact Incision: scant drainage No cellulitis present Dressing/Incision - blood tinged drainage Motor Function - intact, moving foot and toes well on exam.   Abdomen soft on exam, normal BS without tympany.  Past Medical History:  Diagnosis Date  . Arthritis    knees  . Cancer ([place]) 1990,s   left breast  . Hypercholesterolemia   . Hypertension   . Lymphedema of leg    left leg worse than right leg.  . Pre-diabetes      Assessment/Plan: 2 Days Post-Op Procedure(s) (LRB): TOTAL KNEE ARTHROPLASTY (Left) Active Problems:   Status post total knee replacement using cement, left  Estimated body mass index is 34.41 kg/m as calculated from the following:   Height as of this encounter: 5\' 9"  (1.753 m).   Weight as of this encounter: 105.7 kg (233 lb). Advance diet Up with therapy   Labs reviewed, Hg 12.5 Fever of 102.5 last night.  She denies any SOB or urinary symptoms, encouraged incentive spirometer. Will obtain UA and CXR. CBC and BMP ordered for tomorrow morning. Up with therapy today. Plan for discharge to SNF tomorrow. Begin working on bowel movement.  Move on to FLEET enema if needed today.  DVT Prophylaxis - Lovenox, Foot Pumps and TED hose Weight-Bearing as tolerated to left leg  J. Cameron Proud, PA-C [facility] 04/10/2017, 11:41 AM

## 2017-04-10 NOTE — Progress Notes (Signed)
Occupational Therapy Treatment Patient Details Name: [name] MRN: [id number] DOB: 26-May-1941 Today's Date: 04/10/2017    History of present illness Pt. is a 76 y.o. female who was admitted to [facility] for a Left TKA.   OT comments  Pt seen for OT treatment session this date. Pt/dtr educated in [place] for LB ADL tasks, use of 3:1, and falls prevention strategies. Pt progressing towards goals. Will continue to progress.   Follow Up Recommendations  SNF    Equipment Recommendations  Tub/shower bench;3 in 1 bedside commode    Recommendations for Other Services      Precautions / Restrictions Precautions Precautions: Knee;Fall Precaution Booklet Issued: No Restrictions Weight Bearing Restrictions: Yes LLE Weight Bearing: Weight bearing as tolerated Other Position/Activity Restrictions: WBAT       Mobility Bed Mobility   Transfers     Balance                         ADL either performed or assessed with clinical judgement   ADL Overall ADL's : Needs assistance/impaired                       Lower Body Dressing Details (indicate cue type and reason): pt/dtr educated in use of AE for LB dressing tasks with verbal instruction and visual demonstration. Pt/dtr verbalized understanding.   Toilet Transfer Details (indicate cue type and reason): pt/dtr educated in use of 3:1 for bathing and toileting needs. Both verbalized understanding                 Vision Baseline Vision/History: Wears glasses Wears Glasses: Reading only     Perception     Praxis      Cognition Arousal/Alertness: Awake/alert Behavior During Therapy: WFL for tasks assessed/performed Overall Cognitive Status: Within Functional Limits for tasks assessed                                          Exercises Exercises: Other exercises Other Exercises Other Exercises: Pt amb to bathroom, able to perform toileting tasks, requires mod assist +2 to transfer to  standing from 3-in-1.    Shoulder Instructions       General Comments      Pertinent Vitals/ Pain       Pain Assessment: 0-10 Pain Score: 4  Pain Location: Left knee Pain Intervention(s): Limited activity within patient's tolerance;Monitored during session;Ice applied  Home Living                                          Prior Functioning/Environment              Frequency  Min 2X/week        Progress Toward Goals  OT Goals(current goals can now be found in the care plan section)  Progress towards OT goals: Progressing toward goals  Acute Rehab OT Goals Patient Stated Goal: to return to job and prior activities OT Goal Formulation: With patient Potential to Achieve Goals: Good  Plan Discharge plan remains appropriate;Frequency remains appropriate    Co-evaluation                 AM-PAC PT "6 Clicks" Daily Activity     Outcome Measure   Help  from another person eating meals?: None Help from another person taking care of personal grooming?: None Help from another person toileting, which includes using toliet, bedpan, or urinal?: A Lot Help from another person bathing (including washing, rinsing, drying)?: A Lot Help from another person to put on and taking off regular upper body clothing?: A Little Help from another person to put on and taking off regular lower body clothing?: A Lot 6 Click Score: 17    End of Session    OT Visit Diagnosis: Muscle weakness (generalized) (M62.81)   Activity Tolerance Patient tolerated treatment well   Patient Left in bed;with call bell/phone within reach;with bed alarm set;with family/visitor present;with SCD's reapplied;Other (comment) (polar care in place)   Nurse Communication          Time: [phone] OT Time Calculation (min): 12 min  Charges: OT General Charges $OT Visit: 1 Visit OT Treatments $Self Care/Home Management : 8-22 mins  Jeni Salles, MPH, MS, OTR/L ascom  [phone] 04/10/17, 4:17 PM

## 2017-04-10 NOTE — Discharge Instructions (Signed)

## 2017-04-11 ENCOUNTER — Inpatient Hospital Stay: Payer: Medicare Other

## 2017-04-11 LAB — URINE CULTURE: Culture: NO GROWTH

## 2017-04-11 LAB — TYPE AND SCREEN
ABO/RH(D): O POS
Antibody Screen: POSITIVE
UNIT DIVISION: 0
UNIT DIVISION: 0

## 2017-04-11 LAB — BPAM RBC
Blood Product Expiration Date: 201810172359
Blood Product Expiration Date: 201810232359
UNIT TYPE AND RH: 5100
Unit Type and Rh: 5100

## 2017-04-11 LAB — BASIC METABOLIC PANEL
ANION GAP: 3 — AB (ref 5–15)
BUN: 11 mg/dL (ref 6–20)
CO2: 27 mmol/L (ref 22–32)
CREATININE: 0.83 mg/dL (ref 0.44–1.00)
Calcium: 9.2 mg/dL (ref 8.9–10.3)
Chloride: 108 mmol/L (ref 101–111)
GFR calc non Af Amer: >60 mL/min (ref >60)
Glucose, Bld: 146 mg/dL — ABNORMAL HIGH (ref 65–99)
POTASSIUM: 3.9 mmol/L (ref 3.5–5.1)
SODIUM: 138 mmol/L (ref 135–145)

## 2017-04-11 LAB — CBC
HCT: 36.1 % (ref 35.0–47.0)
Hemoglobin: 12.2 g/dL (ref 12.0–16.0)
MCH: 30.5 pg (ref 26.0–34.0)
MCHC: 33.8 g/dL (ref 32.0–36.0)
MCV: 90.1 fL (ref 80.0–100.0)
PLATELETS: 119 10*3/uL — AB (ref 150–440)
RBC: 4 MIL/uL (ref 3.80–5.20)
RDW: 13.4 % (ref 11.5–14.5)
WBC: 6.6 10*3/uL (ref 3.6–11.0)

## 2017-04-11 MED ORDER — TRAMADOL HCL 50 MG PO TABS: 50.0000 mg | ORAL_TABLET | Freq: Four times a day (QID) | ORAL | 0 refills | Status: DC | PRN

## 2017-04-11 NOTE — Progress Notes (Signed)
[name] PT said that pt states that her left calf is very painful, esp to touch. Spoke to Cameron Proud who asked if I would order [place] for pt.

## 2017-04-11 NOTE — Progress Notes (Signed)
Patient states that her left lower leg continues to be painful. This nurse removed her ted hose to examine. There is no redness or swelling. Placed pillow under heels rather than a blanket and patient said it felt better.

## 2017-04-11 NOTE — Progress Notes (Signed)
Subjective: 3 Days Post-Op Procedure(s) (LRB): TOTAL KNEE ARTHROPLASTY (Left) Patient reports pain as mild.   Patient is well but is complaining of increased urinary frequency. Plan is for discharge today for SNF. Negative for chest pain and shortness of breath Fever: No temps were measured last night. Gastrointestinal:Negative for nausea and vomiting  Objective: Vital signs in last 24 hours: Temp:  [99.1 F (37.3 C)] 99.1 F (37.3 C) (10/11 0748) Pulse Rate:  [61-87] 87 (10/11 2029) Resp:  [20] 20 (10/11 0748) BP: (107-138)/(44-56) 138/56 (10/11 2029) SpO2:  [93 %-98 %] 93 % (10/11 2029)  Intake/Output from previous day:  Intake/Output Summary (Last 24 hours) at 04/11/17 0746 Last data filed at 04/10/17 1200  Gross per 24 hour  Intake              480 ml  Output                0 ml  Net              480 ml    Intake/Output this shift: No intake/output data recorded.  Labs:  Recent Labs  04/09/17 0417 04/10/17 0343 04/11/17 0341  HGB 11.5* 12.5 12.2    Recent Labs  04/10/17 0343 04/11/17 0341  WBC 8.4 6.6  RBC 4.08 4.00  HCT 36.6 36.1  PLT 143* 119*    Recent Labs  04/10/17 0343 04/11/17 0341  NA 136 138  K 4.1 3.9  CL 108 108  CO2 24 27  BUN 14 11  CREATININE 1.00 0.83  GLUCOSE 120* 146*  CALCIUM 9.1 9.2   No results for input(s): LABPT, INR in the last 72 hours.  EXAM General - Patient is Alert, Appropriate and Oriented Extremity - ABD soft Sensation intact distally Intact pulses distally Dorsiflexion/Plantar flexion intact Incision: scant drainage No cellulitis present Dressing/Incision - blood tinged drainage Motor Function - intact, moving foot and toes well on exam.   Abdomen soft on exam, normal BS without tympany.  Past Medical History:  Diagnosis Date  . Arthritis    knees  . Cancer ([place]) 1990,s   left breast  . Hypercholesterolemia   . Hypertension   . Lymphedema of leg    left leg worse than right leg.  .  Pre-diabetes     Assessment/Plan: 3 Days Post-Op Procedure(s) (LRB): TOTAL KNEE ARTHROPLASTY (Left) Active Problems:   Status post total knee replacement using cement, left  Estimated body mass index is 34.41 kg/m as calculated from the following:   Height as of this encounter: 5\' 9"  (1.753 m).   Weight as of this encounter: 105.7 kg (233 lb). Advance diet Up with therapy   Labs reviewed, Hg 12.2, WBC 6.6 Fevers two nights ago, UA negative for infection, CXR negative for active pulmonary disease. Increased urinary frequency, foley being used today.  Spoke with the patient that this will continue to improve when returning to a normal diet, UA negative for infection, cultures pending. Up with therapy today. Plan for discharge to SNF this afternoon. Begin working on bowel movement.  Move on to FLEET enema if needed today.  DVT Prophylaxis - Lovenox, Foot Pumps and TED hose Weight-Bearing as tolerated to left leg  J. Cameron Proud, PA-C [facility] 04/11/2017, 7:46 AM

## 2017-04-11 NOTE — Discharge Summary (Signed)
Physician Discharge Summary  Patient ID: [name] MRN: [id number] DOB/AGE: [birth year] 77 y.o.  Admit date: 04/08/2017 Discharge date: 04/11/2017  Admission Diagnoses:  primary osteoarthritis left knee  Discharge Diagnoses: Patient Active Problem List   Diagnosis Date Noted  . Status post total knee replacement using cement, left 04/08/2017  . Bilateral lower extremity edema 03/31/2017  . Hyperlipidemia 03/31/2017  . Essential hypertension 03/31/2017  Primary osteoarthritis of left knee.  Past Medical History:  Diagnosis Date  . Arthritis    knees  . Cancer ([place]) 1990,s   left breast  . Hypercholesterolemia   . Hypertension   . Lymphedema of leg    left leg worse than right leg.  . Pre-diabetes    Transfusion: None.   Consultants (if any):   Discharged Condition: Improved  Hospital Course: [name] is an 76 y.o. female who was admitted 04/08/2017 with a diagnosis of left knee degenerative joint disease and went to the operating room on 04/08/2017 and underwent the above named procedures.    Surgeries: Procedure(s): TOTAL KNEE ARTHROPLASTY on 04/08/2017 Patient tolerated the surgery well. Taken to PACU where she was stabilized and then transferred to the orthopedic floor.  Started on Lovenox 40mg  q 24 hrs. Foot pumps applied bilaterally at 80 mm. Heels elevated on bed with rolled towels. No evidence of DVT. Negative [place]. Physical therapy started on day #1 for gait training and transfer. OT started day #1 for ADL and assisted devices.  Patient's IV and foley were d/c on POD1.  Implants: Left TKA using all-cemented [employer] Vanguard system with a 70 mm PCR femur, a 75 mm tibial tray with a 10 mm AS E-poly insert, and a 37 x 10 mm all-poly 3-pegged domed patella.  She was given perioperative antibiotics:  Anti-infectives    Start     Dose/Rate Route Frequency Ordered Stop   04/08/17 1500  ceFAZolin (ANCEF) 3 g in dextrose 5 % 50 mL IVPB     3 g 130 mL/hr  over 30 Minutes Intravenous Every 6 hours 04/08/17 1316 04/09/17 0257   04/08/17 0826  ceFAZolin (ANCEF) 2-4 GM/100ML-% IVPB    Comments:  Register, Karen   : cabinet override      04/08/17 0826 04/08/17 0905   04/07/17 2300  ceFAZolin (ANCEF) IVPB 2g/100 mL premix     2 g 200 mL/hr over 30 Minutes Intravenous  Once 04/07/17 2246 04/08/17 0920    .  She was given sequential compression devices, early ambulation, and Lovenox for DVT prophylaxis.  She benefited maximally from the hospital stay and there were no complications.    Recent vital signs:  Vitals:   04/10/17 2029 04/11/17 0800  BP: (!) 138/56 (!) 130/48  Pulse: 87 76  Resp:  18  Temp:  99.3 F (37.4 C)  SpO2: 93% 96%    Recent laboratory studies:  Lab Results  Component Value Date   HGB 12.2 04/11/2017   HGB 12.5 04/10/2017   HGB 11.5 (L) 04/09/2017   Lab Results  Component Value Date   WBC 6.6 04/11/2017   PLT 119 (L) 04/11/2017   Lab Results  Component Value Date   INR 1.01 03/12/2017   Lab Results  Component Value Date   NA 138 04/11/2017   K 3.9 04/11/2017   CL 108 04/11/2017   CO2 27 04/11/2017   BUN 11 04/11/2017   CREATININE 0.83 04/11/2017   GLUCOSE 146 (H) 04/11/2017    Discharge Medications:   Allergies as  of 04/11/2017      Reactions   Colesevelam Other (See Comments)   constipation   Rosuvastatin Other (See Comments)   Pain in lower extremities      Medication List    TAKE these medications   acetaminophen 500 MG tablet Commonly known as:  TYLENOL Take 500 mg by mouth every 8 (eight) hours as needed.   atorvastatin 20 MG tablet Commonly known as:  LIPITOR Take 20 mg by mouth at bedtime.   docusate sodium 50 MG capsule Commonly known as:  COLACE Take 150 mg by mouth daily as needed for mild constipation.   enoxaparin 40 MG/0.4ML injection Commonly known as:  LOVENOX Inject 0.4 mLs (40 mg total) into the skin daily.   gabapentin 300 MG capsule Commonly known as:   NEURONTIN Take 300 mg by mouth at bedtime.   losartan 25 MG tablet Commonly known as:  COZAAR Take 25 mg by mouth every morning.   magnesium oxide 400 MG tablet Commonly known as:  MAG-OX Take 400 mg by mouth daily.   Omega-3 Krill Oil 500 MG Caps Take 500 mg by mouth daily.   omeprazole 20 MG capsule Commonly known as:  PRILOSEC Take 20 mg by mouth at bedtime as needed. Acid reflux   oxyCODONE 5 MG immediate release tablet Commonly known as:  Oxy IR/ROXICODONE Take 1-2 tablets (5-10 mg total) by mouth every 4 (four) hours as needed for breakthrough pain.   potassium chloride 10 MEQ tablet Commonly known as:  K-DUR,KLOR-CON Take 10 mEq by mouth daily.   solifenacin 5 MG tablet Commonly known as:  VESICARE Take 5 mg by mouth daily.   TOPROL XL 25 MG 24 hr tablet Generic drug:  metoprolol succinate Take 25 mg by mouth at bedtime.   torsemide 20 MG tablet Commonly known as:  DEMADEX Take 20 mg by mouth daily as needed (fluid).   traMADol 50 MG tablet Commonly known as:  ULTRAM Take 1-2 tablets (50-100 mg total) by mouth every 6 (six) hours as needed for moderate pain. What changed:  how much to take  when to take this  reasons to take this   VOLTAREN 1 % Gel Generic drug:  diclofenac sodium Apply 1 application topically 3 (three) times daily as needed for pain.       Diagnostic Studies: US Venous Img Lower Unilateral Right  Result Date: 03/19/2017 CLINICAL DATA:  Right lower extremity pain and edema with elevated D-dimer EXAM: Right LOWER EXTREMITY VENOUS DOPPLER ULTRASOUND TECHNIQUE: Gray-scale sonography with graded compression, as well as color Doppler and duplex ultrasound were performed to evaluate the lower extremity deep venous systems from the level of the common femoral vein and including the common femoral, femoral, profunda femoral, popliteal and calf veins including the posterior tibial, peroneal and gastrocnemius veins when visible. The superficial  great saphenous vein was also interrogated. Spectral Doppler was utilized to evaluate flow at rest and with distal augmentation maneuvers in the common femoral, femoral and popliteal veins. COMPARISON:  02/27/2007 FINDINGS: Contralateral Common Femoral Vein: Respiratory phasicity is normal and symmetric with the symptomatic side. No evidence of thrombus. Normal compressibility. Common Femoral Vein: No evidence of thrombus. Normal compressibility, respiratory phasicity and response to augmentation. Saphenofemoral Junction: No evidence of thrombus. Normal compressibility and flow on color Doppler imaging. Profunda Femoral Vein: No evidence of thrombus. Normal compressibility and flow on color Doppler imaging. Femoral Vein: No evidence of thrombus. Normal compressibility, respiratory phasicity and response to augmentation. Popliteal Vein: No evidence  of thrombus. Normal compressibility, respiratory phasicity and response to augmentation. Calf Veins: No evidence of thrombus. Suboptimal visualization due to edema. Other Findings:  None. IMPRESSION: No evidence of DVT within the right lower extremity. Electronically Signed   By: Donavan Foil M.D.   On: 03/19/2017 18:48   Dg Chest Port 1 View  Result Date: 04/10/2017 CLINICAL DATA:  Two days of fever, history of left breast malignancy. Nonsmoker. EXAM: PORTABLE CHEST 1 VIEW COMPARISON:  Chest x-ray of March 12, 2017 FINDINGS: The lungs are adequately inflated. There is no focal infiltrate. There is no pleural effusion. The cardiac silhouette is mildly enlarged. The pulmonary vascularity is normal. There is calcification in the wall of the aortic arch in there is tortuosity of the descending thoracic aorta. The observed bony thorax exhibits no acute abnormality. IMPRESSION: There is no active cardiopulmonary disease. Thoracic aortic atherosclerosis. Electronically Signed   By: David  Martinique M.D.   On: 04/10/2017 15:09   Dg Knee Left Port  Result Date:  04/08/2017 CLINICAL DATA:  76 year old female post left knee replacement. Initial encounter. EXAM: PORTABLE LEFT KNEE - 1-2 VIEW COMPARISON:  None. FINDINGS: Post total left knee replacement which appears in satisfactory position without complication noted. Resurfacing of the patella. IMPRESSION: Total left knee replacement in satisfactory position without complication noted. Electronically Signed   By: Genia Del M.D.   On: 04/08/2017 12:36   Disposition: Plan will be for discharge to SNF today so long as the patient remains afebrile today.   Contact information for follow-up providers    Lattie Corns, PA-C On 04/23/2017.   Specialty:  Physician Assistant Why:  [employer]. @ 1:15 pm Contact information: [place] [phone]            Contact information for after-discharge care    Destination    [facility] Follow up.   Specialty:  [place] information: [address] [place] [phone]                 Signed: Judson Roch PA-C 04/11/2017, 9:50 AM

## 2017-04-11 NOTE — Progress Notes (Signed)
Patient is medically stable for D/C to [facility] today. Per [facility] admissions coordinator at [facility] patient can come today to room 204-A. RN will call report at [phone] and arrange EMS for transport. Ortho PA will come to the unit at lunch time and write tramadol prescription. Clinical Education officer, museum (CSW) sent D/C orders to [employer] via [employer]. Patient is aware of above. Per patient her daughter Lattie Haw is aware of D/C today. CSW attempted to contact patient's daughter Lattie Haw however she did not answer and a voicemail was left. Please reconsult if future social work needs arise. CSW signing off.   [employer], LCSW [phone]

## 2017-04-11 NOTE — Care Management Important Message (Signed)
Important Message  Patient Details  Name: [name] MRN: [id number] Date of Birth: 10-26-1940   Medicare Important Message Given:  Yes Patient declined to sign the notice but she has the notice   Katrina Stack, RN 04/11/2017, 9:19 AM

## 2017-04-11 NOTE — Progress Notes (Signed)
Occupational Therapy Treatment Patient Details Name: [name] MRN: [id number] DOB: Sep 06, 1940 Today's Date: 04/11/2017    History of present illness Pt. is a 76 y.o. female who was admitted to [facility] for a Left TKA.   OT comments  Pt. education was provided verbally, and through visual demonstration. Pt. attempted to use the reacher for managing her blanket. Verbal cues were required for hand placement. Pt. declined attempts to practice with the A/E for LE dressing. Pt. Would benefit from follow-up OT services at the next level of care. Pt. plans to go to SNF upon discharge.   Follow Up Recommendations  SNF    Equipment Recommendations       Recommendations for Other Services      Precautions / Restrictions Precautions Precautions: Knee;Fall Precaution Booklet Issued: Yes (comment) Restrictions Weight Bearing Restrictions: Yes LLE Weight Bearing: Weight bearing as tolerated              ADL either performed or assessed with clinical judgement   ADL Overall ADL's : Needs assistance/impaired     Grooming: Set up                                 General ADL Comments: Pt. edcuation was provided about A/E use for LE ADLs.      Vision Baseline Vision/History: Wears glasses Wears Glasses: Reading only     Perception     Praxis      Cognition Arousal/Alertness: Awake/alert Behavior During Therapy: WFL for tasks assessed/performed Overall Cognitive Status: Within Functional Limits for tasks assessed                                          Exercises   Shoulder Instructions       General Comments      Pertinent Vitals/ Pain       Pain Assessment: 0-10 Pain Score: 5  Pain Location: Left knee Pain Intervention(s): Limited activity within patient's tolerance;Monitored during session;Repositioned;Ice applied                                                          Frequency  Min 1X/week         Progress Toward Goals  OT Goals(current goals can now be found in the care plan section)  Progress towards OT goals: Progressing toward goals  Acute Rehab OT Goals Patient Stated Goal: To regain independence OT Goal Formulation: With patient Potential to Achieve Goals: Good  Plan Discharge plan remains appropriate;Frequency remains appropriate    Co-evaluation                 AM-PAC PT "6 Clicks" Daily Activity     Outcome Measure   Help from another person eating meals?: None Help from another person taking care of personal grooming?: None Help from another person toileting, which includes using toliet, bedpan, or urinal?: A Lot Help from another person bathing (including washing, rinsing, drying)?: A Lot Help from another person to put on and taking off regular upper body clothing?: A Little Help from another person to put on and taking off regular lower body clothing?: A Lot 6  Click Score: 17    End of Session    OT Visit Diagnosis: Muscle weakness (generalized) (M62.81)   Activity Tolerance Patient tolerated treatment well   Patient Left in bed;with call bell/phone within reach;with bed alarm set;with family/visitor present   Nurse Communication      Functional Limitation: Self care Self Care Current Status (B7048): At least 60 percent but less than 80 percent impaired, limited or restricted Self Care Goal Status (G8916): At least 40 percent but less than 60 percent impaired, limited or restricted Self Care Discharge Status [phone]): At least 1 percent but less than 20 percent impaired, limited or restricted   Time: 1130-1140 OT Time Calculation (min): 10 min  Charges: OT G-codes **NOT FOR INPATIENT CLASS** Functional Limitation: Self care Self Care Current Status (U8828): At least 60 percent but less than 80 percent impaired, limited or restricted Self Care Goal Status (M0349): At least 40 percent but less than 60 percent impaired, limited or  restricted Self Care Discharge Status [phone]): At least 1 percent but less than 20 percent impaired, limited or restricted OT General Charges $OT Visit: 1 Visit OT Treatments $Self Care/Home Management : 8-22 mins  Harrel Carina, MS, OTR/L  Harrel Carina, MS, OTR/L 04/11/2017, 11:57 AM

## 2017-04-11 NOTE — Progress Notes (Signed)
Physical Therapy Treatment Patient Details Name: [name] MRN: [id number] DOB: [birth year] Today's Date: 04/11/2017    History of Present Illness Pt. is a 76 y.o. female who was admitted to [facility] for a Left TKA.    PT Comments    Pt is able to complete seated exercises and ambulate with therapy during PM session. He is able to ambulate limited distances in room with therapist. Cues for sequencing with walker. Pt demonstrates step-to pattern which is antalgic with increased UE support on walker. Pt reports L distal calf pain with palpation during seated exercises. Pt grimaces and jumps with mild palpation and Homan test. RN notified who contacted PA. Per RN LE doppler ordered. Pt will be appropriate to discharge to SNF when medically appropriate. Pt will benefit from PT services to address deficits in strength, balance, and mobility in order to return to full function at home.    Follow Up Recommendations  SNF     Equipment Recommendations  Rolling walker with 5" wheels    Recommendations for Other Services       Precautions / Restrictions Precautions Precautions: Knee;Fall Precaution Booklet Issued: Yes (comment) Restrictions Weight Bearing Restrictions: Yes LLE Weight Bearing: Weight bearing as tolerated    Mobility  Bed Mobility Overal bed mobility: Needs Assistance Bed Mobility: Sit to Supine     Supine to sit: Min guard     General bed mobility comments: Received and left upright in recliner  Transfers Overall transfer level: Needs assistance Equipment used: Rolling walker (2 wheeled) Transfers: Sit to/from Stand Sit to Stand: Mod assist         General transfer comment: Pt requires cues for safe transfers along with increased time. Decreased weight shifting to LLE during transfer. Once upright able to remain in standing with CGA only  Ambulation/Gait Ambulation/Gait assistance: Min assist Ambulation Distance (Feet): 25 Feet Assistive device: Rolling  walker (2 wheeled) Gait Pattern/deviations: Step-to pattern Gait velocity: Decreased Gait velocity interpretation: <1.8 ft/sec, indicative of risk for recurrent falls General Gait Details: Pt able to ambulate limited distances in room with therapist. Cues for sequencing with walker. Pt demonstrates step-to pattern which is antalgic with increased UE support on walker   Stairs            Wheelchair Mobility    Modified Rankin (Stroke Patients Only)       Balance Overall balance assessment: Needs assistance Sitting-balance support: Feet supported Sitting balance-Leahy Scale: Good Sitting balance - Comments: No assist   Standing balance support: Bilateral upper extremity supported Standing balance-Leahy Scale: Fair Standing balance comment: Requires UE support in standing on rollilng walker                            Cognition Arousal/Alertness: Awake/alert Behavior During Therapy: WFL for tasks assessed/performed Overall Cognitive Status: Within Functional Limits for tasks assessed                                        Exercises Total Joint Exercises Ankle Circles/Pumps: Strengthening;Left;10 reps;Seated;Other (comment) (Heel raises) Hip ABduction/ADduction: Strengthening;Both;10 reps;Seated Long Arc Quad: Strengthening;Left;10 reps;Seated Knee Flexion: Strengthening;Left;10 reps;Seated Goniometric ROM: L knee AAROM 0-85 degrees  Marching in Standing: Strengthening;Both;10 reps;Seated Other Exercises Other Exercises: Ther-ex 15x, LLE ankle pumps, quad sets, hip abd/add, SLRs, heel slides. CGA for proper technique    General Comments  Pertinent Vitals/Pain Pain Assessment: 0-10 Pain Score: 7  Pain Location: L knee Pain Intervention(s): Monitored during session    Home Living                      Prior Function            PT Goals (current goals can now be found in the care plan section) Acute Rehab PT  Goals Patient Stated Goal: To regain independence PT Goal Formulation: With patient Time For Goal Achievement: 04/23/17 Potential to Achieve Goals: Good Progress towards PT goals: Progressing toward goals    Frequency    BID      PT Plan Current plan remains appropriate    Co-evaluation              AM-PAC PT "6 Clicks" Daily Activity  Outcome Measure  Difficulty turning over in bed (including adjusting bedclothes, sheets and blankets)?: None Difficulty moving from lying on back to sitting on the side of the bed? : Unable Difficulty sitting down on and standing up from a chair with arms (e.g., wheelchair, bedside commode, etc,.)?: Unable Help needed moving to and from a bed to chair (including a wheelchair)?: A Little Help needed walking in hospital room?: A Little Help needed climbing 3-5 steps with a railing? : A Lot 6 Click Score: 14    End of Session Equipment Utilized During Treatment: Gait belt Activity Tolerance: Patient tolerated treatment well;Patient limited by fatigue Patient left: in chair;with call bell/phone within reach;with chair alarm set;with SCD's reapplied;Other (comment) (towel roll under heel, polar care in place) Nurse Communication: Mobility status PT Visit Diagnosis: Other abnormalities of gait and mobility (R26.89);Unsteadiness on feet (R26.81);Muscle weakness (generalized) (M62.81);Pain Pain - Right/Left: Left Pain - part of body: Knee     Time: 1210-1234 PT Time Calculation (min) (ACUTE ONLY): 24 min  Charges:  $Gait Training: 8-22 mins $Therapeutic Exercise: 8-22 mins                    G Codes:  Functional Assessment Tool Used: AM-PAC 6 Clicks Basic Mobility Functional Limitation: Mobility: Walking and moving around Mobility: Walking and Moving Around Current Status (J4970): At least 40 percent but less than 60 percent impaired, limited or restricted Mobility: Walking and Moving Around Goal Status [id number]): At least 20 percent but  less than 40 percent impaired, limited or restricted    [name] PT, DPT     [name] 04/11/2017, 12:48 PM

## 2017-04-11 NOTE — Clinical Social Work Placement (Signed)
   CLINICAL SOCIAL WORK PLACEMENT  NOTE  Date:  04/11/2017  Patient Details  Name: NASIYA PASCUAL MRN: [id number] Date of Birth: January 13, 1941  Clinical Social Work is seeking post-discharge placement for this patient at the [place] level of care (*CSW will initial, date and re-position this form in  chart as items are completed):  Yes   Patient/family provided with [facility] Work Department's list of facilities offering this level of care within the geographic area requested by the patient (or if unable, by the patient's family).  Yes   Patient/family informed of their freedom to choose among providers that offer the needed level of care, that participate in Medicare, Medicaid or managed care program needed by the patient, have an available bed and are willing to accept the patient.  Yes   Patient/family informed of [facility]'s ownership interest in [facility] and [facility], as well as of the fact that they are under no obligation to receive care at these facilities.  PASRR submitted to EDS on 04/08/17     PASRR number received on 04/08/17     Existing PASRR number confirmed on       FL2 transmitted to all facilities in geographic area requested by pt/family on 04/08/17     FL2 transmitted to all facilities within larger geographic area on       Patient informed that his/her managed care company has contracts with or will negotiate with certain facilities, including the following:        Yes   Patient/family informed of bed offers received.  Patient chooses bed at  [facility] )     Physician recommends and patient chooses bed at      Patient to be transferred to  [facility]) on 04/11/17.  Patient to be transferred to facility by  [facility] )     Patient family notified on 04/11/17 of transfer.  Name of family member notified:   (Patient stated that her daughter Lattie Haw is aware of her D/C today. CSW left  patient's daughter Lattie Haw a voicemail. )     PHYSICIAN       Additional Comment:    _______________________________________________ Ilse Billman, Veronia Beets, LCSW 04/11/2017, 9:19 AM

## 2017-04-11 NOTE — Progress Notes (Signed)
Discharge order has been cancelled for today. Clinical Education officer, museum (CSW) has notified Horticulturist, commercial at [employer].  [employer], LCSW [phone]

## 2017-04-11 NOTE — Progress Notes (Signed)
Physical Therapy Treatment Patient Details Name: [name] MRN: [id number] DOB: 1940/09/06 Today's Date: 04/11/2017    History of Present Illness Pt. is a 76 y.o. female who was admitted to [facility] for a Left TKA.    PT Comments    Pt is making slow progress towards goals. Pt reminded to place towel roll under L ankle while resting in recliner or bed. Pt performed there-ex, bed mobility with CGA, transfers with RW with mod assist, amb with RW with min assist. Pt amb a total of 40 ft, amb slowly with short steps. Pt appears easily fatigued with all mobility activities, however is motivated to participate throughout session.   Follow Up Recommendations  SNF     Equipment Recommendations  Rolling walker with 5" wheels    Recommendations for Other Services       Precautions / Restrictions Precautions Precautions: Knee;Fall Precaution Booklet Issued: Yes (comment) Restrictions Weight Bearing Restrictions: Yes LLE Weight Bearing: Weight bearing as tolerated    Mobility  Bed Mobility Overal bed mobility: Needs Assistance Bed Mobility: Sit to Supine     Supine to sit: Min guard     General bed mobility comments: CGA to guide LEs to seated  Transfers Overall transfer level: Needs assistance Equipment used: Rolling walker (2 wheeled) Transfers: Sit to/from Stand Sit to Stand: Mod assist         General transfer comment: cues for proper technique, pt leans forward, cued to stand upright, requires additonal time  Ambulation/Gait Ambulation/Gait assistance: Min assist Ambulation Distance (Feet): 40 Feet Assistive device: Rolling walker (2 wheeled) Gait Pattern/deviations: Step-to pattern     General Gait Details: no cues for proper sequencing, takes very short steps, cues to keep RW close to body, pt requires additional time    Stairs            Wheelchair Mobility    Modified Rankin (Stroke Patients Only)       Balance Overall balance assessment:  Needs assistance Sitting-balance support: Feet supported Sitting balance-Leahy Scale: Good Sitting balance - Comments: No assist   Standing balance support: Bilateral upper extremity supported Standing balance-Leahy Scale: Good Standing balance comment: forward trunk leans, cues to stand upright, relies heavily on RW for support                            Cognition Arousal/Alertness: Awake/alert Behavior During Therapy: WFL for tasks assessed/performed Overall Cognitive Status: Within Functional Limits for tasks assessed                                        Exercises Total Joint Exercises Goniometric ROM: L knee AAROM 0-85 degrees  Other Exercises Other Exercises: Ther-ex 15x, LLE ankle pumps, quad sets, hip abd/add, SLRs, heel slides. CGA for proper technique    General Comments        Pertinent Vitals/Pain Pain Assessment: 0-10 Pain Score: 5  Pain Location: Left knee Pain Intervention(s): Limited activity within patient's tolerance;Monitored during session;Repositioned;Ice applied    Home Living                      Prior Function            PT Goals (current goals can now be found in the care plan section) Acute Rehab PT Goals Patient Stated Goal: to return to job  and prior activities PT Goal Formulation: With patient Time For Goal Achievement: 04/23/17 Potential to Achieve Goals: Good Progress towards PT goals: Progressing toward goals    Frequency    BID      PT Plan Current plan remains appropriate    Co-evaluation              AM-PAC PT "6 Clicks" Daily Activity  Outcome Measure  Difficulty turning over in bed (including adjusting bedclothes, sheets and blankets)?: None Difficulty moving from lying on back to sitting on the side of the bed? : Unable Difficulty sitting down on and standing up from a chair with arms (e.g., wheelchair, bedside commode, etc,.)?: Unable Help needed moving to and from a bed  to chair (including a wheelchair)?: A Little Help needed walking in hospital room?: A Little Help needed climbing 3-5 steps with a railing? : A Lot 6 Click Score: 14    End of Session Equipment Utilized During Treatment: Gait belt Activity Tolerance: Patient tolerated treatment well;Patient limited by fatigue Patient left: in chair;with call bell/phone within reach;with chair alarm set Nurse Communication: Mobility status PT Visit Diagnosis: Other abnormalities of gait and mobility (R26.89);Unsteadiness on feet (R26.81);Muscle weakness (generalized) (M62.81);Pain Pain - Right/Left: Left Pain - part of body: Knee     Time: 0911-0944 PT Time Calculation (min) (ACUTE ONLY): 33 min  Charges:                       G Codes:  Functional Assessment Tool Used: AM-PAC 6 Clicks Basic Mobility Functional Limitation: Mobility: Walking and moving around Mobility: Walking and Moving Around Current Status (Y6063): At least 40 percent but less than 60 percent impaired, limited or restricted Mobility: Walking and Moving Around Goal Status [id number]): At least 20 percent but less than 40 percent impaired, limited or restricted    Manfred Arch, SPT   Manfred Arch 04/11/2017, 11:03 AM

## 2017-04-11 NOTE — Care Management (Signed)
There is a discharge order present for today.  CM provided patient with the Medicare IM notice and explained.  She declined to sign.  She verbalizes concern over urinary frequency and feels this concern has not been addressed.  CM left message for charge nurse to address patient's concerns. have updated CSW that patient is considering appealing the discharge.  Review of vital signs show some elevation in temp over the past 24 hours. It is very possible that the discharge could be cancelled due to the temperature

## 2017-04-12 LAB — BASIC METABOLIC PANEL
Anion gap: 4 — ABNORMAL LOW (ref 5–15)
BUN: 16 mg/dL (ref 6–20)
CALCIUM: 9.2 mg/dL (ref 8.9–10.3)
CO2: 28 mmol/L (ref 22–32)
CREATININE: 1.02 mg/dL — AB (ref 0.44–1.00)
Chloride: 107 mmol/L (ref 101–111)
GFR calc Af Amer: 60 mL/min — ABNORMAL LOW (ref >60)
GFR calc non Af Amer: 52 mL/min — ABNORMAL LOW (ref >60)
Glucose, Bld: 138 mg/dL — ABNORMAL HIGH (ref 65–99)
Potassium: 4 mmol/L (ref 3.5–5.1)
Sodium: 139 mmol/L (ref 135–145)

## 2017-04-12 LAB — CBC
HEMATOCRIT: 30 % — AB (ref 35.0–47.0)
Hemoglobin: 10.2 g/dL — ABNORMAL LOW (ref 12.0–16.0)
MCH: 30.7 pg (ref 26.0–34.0)
MCHC: 34 g/dL (ref 32.0–36.0)
MCV: 90.1 fL (ref 80.0–100.0)
Platelets: 116 10*3/uL — ABNORMAL LOW (ref 150–440)
RBC: 3.33 MIL/uL — ABNORMAL LOW (ref 3.80–5.20)
RDW: 13.3 % (ref 11.5–14.5)
WBC: 5.3 10*3/uL (ref 3.6–11.0)

## 2017-04-12 MED ORDER — TRAMADOL HCL 50 MG PO TABS: 50.0000 mg | ORAL_TABLET | Freq: Four times a day (QID) | ORAL | 0 refills | Status: DC | PRN

## 2017-04-12 MED ORDER — LACTULOSE 10 GM/15ML PO SOLN: 10.0000 g | Freq: Two times a day (BID) | ORAL | Status: DC | PRN

## 2017-04-12 MED ORDER — OXYCODONE HCL 5 MG PO TABS: 5.0000 mg | ORAL_TABLET | ORAL | 0 refills | Status: DC | PRN

## 2017-04-12 NOTE — Progress Notes (Signed)
Physical Therapy Treatment Patient Details Name: [name] MRN: [id number] DOB: [birth year] Today's Date: 04/12/2017    History of Present Illness Pt. is a 76 y.o. female who was admitted to [facility] for a Left TKA.    PT Comments    Pt agreeable to PT; however, notes she needs cleaned up, as she is wet. Pt feels external catheter was not placed well. Cup at wall noted to be empty. Pt aggravated/unpleasant with therapist, but does participate in supine exercises with assist as needed and assist with stretching left knee. Much less flexion gained in supine as compared to last session. Pt also encouraged to maintain left leg in neutral rotation from hip to allow for knee extension in supine. Pt does not tolerated towel/blanket roll under ankle. Does tolerated folded pillow at ankle; placed blanket roll on lateral side of knee for cue to avoid ER of hip. Pt plans to d/c to skilled nursing facility today to continue rehab efforts.    Follow Up Recommendations  SNF     Equipment Recommendations  Rolling walker with 5" wheels    Recommendations for Other Services       Precautions / Restrictions Precautions Precautions: Knee;Fall Restrictions Weight Bearing Restrictions: Yes LLE Weight Bearing: Weight bearing as tolerated    Mobility  Bed Mobility               General bed mobility comments: Not tested; pt notes she is wet feeling external catherter was not placed correctly. Nursing called for pt, but had not arrived during session to assist  Transfers                    Ambulation/Gait                 Stairs            Wheelchair Mobility    Modified Rankin (Stroke Patients Only)       Balance                                            Cognition Arousal/Alertness: Awake/alert Behavior During Therapy: Agitated (not pleasant) Overall Cognitive Status: Within Functional Limits for tasks assessed                                 General Comments: Pt very short with responses; very aggravated, limited responses.       Exercises Total Joint Exercises Ankle Circles/Pumps: AROM;Both;20 reps;Supine Quad Sets: Strengthening;Both;20 reps;Supine Gluteal Sets: Strengthening;Both;20 reps;Supine Short Arc Quad: AAROM;Left;20 reps;Supine Heel Slides: AAROM;Left;20 reps;Supine Straight Leg Raises: AAROM;Left;10 reps;Supine Knee Flexion: AROM;AAROM;Left;10 reps;Supine (with stretch hold) Goniometric ROM: 0-57 (in supine)    General Comments        Pertinent Vitals/Pain Pain Assessment: 0-10 Pain Score: 4  Pain Location: L knee Pain Intervention(s): Monitored during session;Premedicated before session;Ice applied    Home Living                      Prior Function            PT Goals (current goals can now be found in the care plan section) Progress towards PT goals: Not progressing toward goals - comment (this session)    Frequency    BID      PT Plan  Current plan remains appropriate    Co-evaluation              AM-PAC PT "6 Clicks" Daily Activity  Outcome Measure  Difficulty turning over in bed (including adjusting bedclothes, sheets and blankets)?: None Difficulty moving from lying on back to sitting on the side of the bed? : Unable Difficulty sitting down on and standing up from a chair with arms (e.g., wheelchair, bedside commode, etc,.)?: Unable Help needed moving to and from a bed to chair (including a wheelchair)?: A Little Help needed walking in hospital room?: A Little Help needed climbing 3-5 steps with a railing? : A Lot 6 Click Score: 14    End of Session   Activity Tolerance: Treatment limited secondary to agitation Patient left: in bed;with call bell/phone within reach;with bed alarm set;Other (comment) (polar care in place)   PT Visit Diagnosis: Other abnormalities of gait and mobility (R26.89);Unsteadiness on feet (R26.81);Muscle weakness  (generalized) (M62.81);Pain Pain - Right/Left: Left Pain - part of body: Knee     Time: 2500-3704 PT Time Calculation (min) (ACUTE ONLY): 16 min  Charges:  $Therapeutic Exercise: 8-22 mins                    G Codes:        Larae Grooms, PTA 04/12/2017, 11:51 AM

## 2017-04-12 NOTE — Progress Notes (Signed)
Patient states she has not had a BM post surgery. Educated patient about constipation medications and interventions. Patient requested MOM tonight and suppository in the morning if needed.

## 2017-04-12 NOTE — Progress Notes (Deleted)
OT Cancellation Note  Patient Details Name: [name] MRN: [id number] DOB: 01-09-1941   Cancelled Treatment:    Reason Eval/Treat Not Completed: Medical issues which prohibited therapy (Pt. with elevated Troponin which is contraindicated for OT services at this time. Will continue to monitor, and intervene when appropriate.)  Harrel Carina, MS, OTR/L 04/12/2017, 10:21 AM

## 2017-04-12 NOTE — Progress Notes (Signed)
Patient is being discharged to [facility] 204-A. NT prepared patient for transport via EMS. DC packet printed for patient. Nurse has been trying to reach them to give report but the phone is busy.

## 2017-04-12 NOTE — Progress Notes (Signed)
Melissa from [facility] called to get report on Rm 140, [place].  EMS called.

## 2017-04-12 NOTE — Progress Notes (Signed)
   Subjective: 4 Days Post-Op Procedure(s) (LRB): TOTAL KNEE ARTHROPLASTY (Left) Patient reports pain as 7 on 0-10 scale.   Patient is well, and has had no acute complaints or problems Pt did fair with therapy yesterday. States she was sick Plan is to go Rehab after hospital stay. no nausea and no vomiting Patient denies any chest pains or shortness of breath. Objective: Vital signs in last 24 hours: Temp:  [99.1 F (37.3 C)-99.3 F (37.4 C)] 99.1 F (37.3 C) (10/12 2115) Pulse Rate:  [76-84] 84 (10/12 2115) Resp:  [18] 18 (10/12 2115) BP: (93-130)/(48-56) 114/56 (10/12 2141) SpO2:  [96 %-97 %] 97 % (10/12 2115) well approximated incision Heels are non tender and elevated off the bed using rolled towels Intake/Output from previous day: 10/12 0701 - 10/13 0700 In: 240 [P.O.:240] Out: -  Intake/Output this shift: No intake/output data recorded.   Recent Labs  04/10/17 0343 04/11/17 0341 04/12/17 0337  HGB 12.5 12.2 10.2*    Recent Labs  04/11/17 0341 04/12/17 0337  WBC 6.6 5.3  RBC 4.00 3.33*  HCT 36.1 30.0*  PLT 119* 116*    Recent Labs  04/11/17 0341 04/12/17 0337  NA 138 139  K 3.9 4.0  CL 108 107  CO2 27 28  BUN 11 16  CREATININE 0.83 1.02*  GLUCOSE 146* 138*  CALCIUM 9.2 9.2   No results for input(s): LABPT, INR in the last 72 hours.  EXAM General - Patient is Alert, Appropriate and Oriented Extremity - Neurologically intact Neurovascular intact Sensation intact distally Intact pulses distally Dorsiflexion/Plantar flexion intact No cellulitis present Compartment soft Dressing - scant drainage Motor Function - intact, moving foot and toes well on exam.    Past Medical History:  Diagnosis Date  . Arthritis    knees  . Cancer ([place]) 1990,s   left breast  . Hypercholesterolemia   . Hypertension   . Lymphedema of leg    left leg worse than right leg.  . Pre-diabetes     Assessment/Plan: 4 Days Post-Op Procedure(s) (LRB): TOTAL  KNEE ARTHROPLASTY (Left) Active Problems:   Status post total knee replacement using cement, left  Estimated body mass index is 34.41 kg/m as calculated from the following:   Height as of this encounter: 5\' 9"  (1.753 m).   Weight as of this encounter: 105.7 kg (233 lb). Up with therapy Discharge to SNF  Labs: reviewed DVT Prophylaxis - Lovenox, Foot Pumps and TED hose Weight-Bearing as tolerated to left leg Please change dressing prior to d/c  Naeema Patlan R. [place] 04/12/2017, 7:58 AM

## 2017-04-12 NOTE — Clinical Social Work Note (Signed)
The patient will discharge today via non-emergent EMS to [facility]. The patient and facility are aware and in agreement. The packet is on the chart, and all discharge paperwork has been sent to the facility. The room # is 204A and report is to be called to [phone]. CSW will continue to follow pending additional discharge needs.  Santiago Bumpers, MSW, Latanya Presser [phone]

## 2017-04-12 NOTE — Progress Notes (Signed)
EMS here to transport patient to [facility]

## 2017-04-14 DIAGNOSIS — K5903 Drug induced constipation: Secondary | ICD-10-CM | POA: Insufficient documentation

## 2017-04-15 ENCOUNTER — Non-Acute Institutional Stay (SKILLED_NURSING_FACILITY): Payer: Medicare Other | Admitting: Gerontology

## 2017-04-15 DIAGNOSIS — M1712 Unilateral primary osteoarthritis, left knee: Secondary | ICD-10-CM

## 2017-04-15 DIAGNOSIS — L03116 Cellulitis of left lower limb: Secondary | ICD-10-CM

## 2017-04-15 DIAGNOSIS — Z96652 Presence of left artificial knee joint: Secondary | ICD-10-CM | POA: Diagnosis not present

## 2017-04-16 ENCOUNTER — Encounter: Payer: Self-pay | Admitting: Gerontology

## 2017-04-16 NOTE — Progress Notes (Signed)
Location:   [facility] Room Number: [phone] Place of Service:  SNF [phone]) Provider:  Toni Arthurs, NP-C  Glendon Axe, MD  Patient Care Team: Glendon Axe, MD as PCP - General (Internal Medicine)  Extended Emergency Contact Information Primary Emergency Contact: [facility] Address: [address]          [place], [place] [address] Johnnette Litter of [place] Phone: [phone] Relation: Daughter Secondary Emergency Contact: Hart Rosenhayn, [place] [address] Johnnette Litter of [place] Phone: [phone] Mobile Phone: [phone] Relation: Son  Code Status:  FULL Goals of care: Advanced Directive information Advanced Directives 04/15/2017  Does Patient Have a Medical Advance Directive? No  Would patient like information on creating a medical advance directive? -     Chief Complaint  Patient presents with  . Acute Visit    Follow up on leg swelling    HPI:  Pt is a 76 y.o. female seen today for an acute visit for increased pain and swelling of the left lower extremity.  Patient is at the facility for rehab status post left total knee replacement.  Patient is participating somewhat with PT and OT.  However, she is complaining of increased pain and edema in the leg.  Incision of the knee is well approximated.  No redness, no irritation, no drainage.  Dressing CDI.  Polar Care in place.  Left calf, ankle and foot with 2+ nonpitting edema.  Mild redness and warmth.  More concentrated redness and warmth in the distal tibial area proximal to the ankle.  Patient reports this area of the skin is very tender to light touch.  No break in skin integrity.  Skin is moist and supple.  No cracks or fissures bilateral pedal pulses strong and equal.  Calf is tender to palpation.  Very concerned reports she recently had an episode of cellulitis in the other leg that "started out just like this".  Patient is very concerned about the possible  cellulitis of the left ankle because she does not wanted to affect the knee and her recovery.  We will obtain Doppler to rule out DVT, but will treat for cellulitis in the meantime.  Otherwise, patient reports her appetite is good, having regular BMs.  Patient denies cough, congestion, chest pain or shortness of breath.  She reports her pain is generally well controlled on current regimen.  Vital signs stable.  No other complaints.   Past Medical History:  Diagnosis Date  . Acquired hammertoe    other  . Angina pectoris, unspecified ([place])   . Arthritis    knees  . Cancer ([place]) 1990,s   left breast  . Degenerative joint disease   . Dermatophytosis of nail   . Diabetes mellitus type 2, uncomplicated ([place])   . Essential hypertension, benign   . Exostosis    unspecified site  . Fracture of right ankle 2010   Followed by Dr. Vickki Muff  . Hypercholesterolemia   . Hyperlipidemia, unspecified   . Hypertension   . Lymphedema of leg    left leg worse than right leg.  . Obesity, unspecified   . Osteoarthrosis involving lower leg    unspecified whether generalized or localized   . Pre-diabetes   . Pure hypercholesterolemia   . Vitamin D deficiency, unspecified    Past Surgical History:  Procedure Laterality Date  . BREAST SURGERY Left 1990   lumpectomy  . BUNIONECTOMY Bilateral [id number]  plus hammer toe repair on left foot.  . ENDOMETRIAL BIOPSY    . TONSILLECTOMY    . TOTAL KNEE ARTHROPLASTY Left 04/08/2017   Procedure: TOTAL KNEE ARTHROPLASTY;  Surgeon: Corky Mull, MD;  Location: [facility] ORS;  Service: Orthopedics;  Laterality: Left;    Allergies  Allergen Reactions  . Colesevelam Other (See Comments)    constipation  . Oxybutynin Swelling    Leg swelling  . Rosuvastatin Other (See Comments)    Pain in lower extremities    Allergies as of 04/15/2017      Reactions   Colesevelam Other (See Comments)   constipation   Rosuvastatin Other (See Comments)   Pain in lower  extremities      Medication List       Accurate as of 04/15/17 11:59 PM. Always use your most recent med list.          acetaminophen 500 MG tablet Commonly known as:  TYLENOL Take 500 mg by mouth 4 (four) times daily.   atorvastatin 20 MG tablet Commonly known as:  LIPITOR Take 20 mg by mouth at bedtime.   cephALEXin 500 MG capsule Commonly known as:  KEFLEX Take 500 mg by mouth every 6 (six) hours.   enoxaparin 40 MG/0.4ML injection Commonly known as:  LOVENOX Inject 0.4 mLs (40 mg total) into the skin daily.   gabapentin 300 MG capsule Commonly known as:  NEURONTIN Take 300 mg by mouth at bedtime.   losartan 25 MG tablet Commonly known as:  COZAAR Take 25 mg by mouth every morning.   magnesium oxide 400 MG tablet Commonly known as:  MAG-OX Take 400 mg by mouth daily.   nystatin cream Commonly known as:  MYCOSTATIN Apply 1 application topically 2 (two) times daily. Apply thin film to yeast rash under the breasts   Omega-3 Krill Oil 500 MG Caps Take 500 mg by mouth daily.   omeprazole 20 MG capsule Commonly known as:  PRILOSEC Take 20 mg by mouth at bedtime as needed. Acid reflux   oxyCODONE 5 MG immediate release tablet Commonly known as:  Oxy IR/ROXICODONE Take 1-2 tablets (5-10 mg total) by mouth every 4 (four) hours as needed for breakthrough pain.   potassium chloride 10 MEQ tablet Commonly known as:  K-DUR,KLOR-CON Take 10 mEq by mouth daily.   sennosides-docusate sodium 8.6-50 MG tablet Commonly known as:  SENOKOT-S Take 2 tablets by mouth 2 (two) times daily.   solifenacin 5 MG tablet Commonly known as:  VESICARE Take 5 mg by mouth daily.   TOPROL XL 25 MG 24 hr tablet Generic drug:  metoprolol succinate Take 25 mg by mouth at bedtime.   torsemide 20 MG tablet Commonly known as:  DEMADEX Take 20 mg by mouth daily as needed (fluid).   VOLTAREN 1 % Gel Generic drug:  diclofenac sodium Apply 1 application topically 3 (three) times  daily as needed for pain.       Review of Systems  Constitutional: Negative for activity change, appetite change, chills, diaphoresis and fever.  HENT: Negative for congestion, sneezing, sore throat, trouble swallowing and voice change.   Respiratory: Negative for apnea, cough, choking, chest tightness, shortness of breath and wheezing.   Cardiovascular: Positive for leg swelling. Negative for chest pain and palpitations.  Gastrointestinal: Negative for abdominal distention, abdominal pain, constipation, diarrhea and nausea.  Genitourinary: Negative for difficulty urinating, dysuria, frequency and urgency.  Musculoskeletal: Positive for arthralgias (typical arthritis) and myalgias. Negative for back pain and gait  problem.  Skin: Positive for wound. Negative for color change, pallor and rash.  Neurological: Negative for dizziness, tremors, syncope, speech difficulty, weakness, numbness and headaches.  Psychiatric/Behavioral: Negative for agitation and behavioral problems.  All other systems reviewed and are negative.   Immunization History  Administered Date(s) Administered  . Influenza Split 05/01/2015  . Influenza-Unspecified 03/13/2016  . Pneumococcal Polysaccharide-23 04/09/2017   Pertinent  Health Maintenance Due  Topic Date Due  . INFLUENZA VACCINE  01/29/2017  . PNA vac Low Risk Adult (2 of 2 - PCV13) 04/09/2018  . DEXA SCAN  Completed   No flowsheet data found. Functional Status Survey:    Vitals:   04/15/17 0953  BP: 140/70  Pulse: 83  Resp: 18  Temp: 97.7 F (36.5 C)  SpO2: 100%  Weight: 242 lb 4.8 oz (109.9 kg)  Height: 5\' 9"  (1.753 m)   Body mass index is 35.78 kg/m. Physical Exam  Constitutional: She is oriented to person, place, and time. Vital signs are normal. She appears well-developed and well-nourished. She is active and cooperative. She does not appear ill. No distress.  HENT:  Head: Normocephalic and atraumatic.  Mouth/Throat: Uvula is midline,  oropharynx is clear and moist and mucous membranes are normal. Mucous membranes are not pale, not dry and not cyanotic.  Eyes: Pupils are equal, round, and reactive to light. Conjunctivae, EOM and lids are normal.  Neck: Trachea normal, normal range of motion and full passive range of motion without pain. Neck supple. No JVD present. No tracheal deviation, no edema and no erythema present. No thyromegaly present.  Cardiovascular: Normal rate, regular rhythm, normal heart sounds, intact distal pulses and normal pulses.  Exam reveals no gallop, no distant heart sounds and no friction rub.   No murmur heard. Pulses:      Dorsalis pedis pulses are 2+ on the right side, and 2+ on the left side.  2+ pitting at edema LLE  Pulmonary/Chest: Effort normal and breath sounds normal. No accessory muscle usage. No respiratory distress. She has no decreased breath sounds. She has no wheezes. She has no rhonchi. She has no rales. She exhibits no tenderness.  Abdominal: Soft. Normal appearance and bowel sounds are normal. She exhibits no distension and no ascites. There is no tenderness.  Musculoskeletal: She exhibits no edema.       Left knee: She exhibits decreased range of motion, swelling and laceration. Tenderness found.       Left ankle: She exhibits swelling. Tenderness.  Expected osteoarthritis, stiffness.  RLE soft, supple.  Negative Homans sign.  LLE edematous, with redness and warmth.  Tender on palpation  Neurological: She is alert and oriented to person, place, and time. She has normal strength.  Skin: Skin is warm and dry. Laceration noted. She is not diaphoretic. No cyanosis. No pallor. Nails show no clubbing.  Psychiatric: She has a normal mood and affect. Her speech is normal and behavior is normal. Judgment and thought content normal. Cognition and memory are normal.  Nursing note and vitals reviewed.   Labs reviewed:  Recent Labs  04/10/17 0343 04/11/17 0341 04/12/17 0337  NA 136 138 139   K 4.1 3.9 4.0  CL 108 108 107  CO2 24 27 28   GLUCOSE 120* 146* 138*  BUN 14 11 16   CREATININE 1.00 0.83 1.02*  CALCIUM 9.1 9.2 9.2   No results for input(s): AST, ALT, ALKPHOS, BILITOT, PROT, ALBUMIN in the last 8760 hours.  Recent Labs  04/09/17 0417 04/10/17 0343  04/11/17 0341 04/12/17 0337  WBC 8.2 8.4 6.6 5.3  NEUTROABS 6.0  --   --   --   HGB 11.5* 12.5 12.2 10.2*  HCT 33.4* 36.6 36.1 30.0*  MCV 89.6 89.7 90.1 90.1  PLT 153 143* 119* 116*   No results found for: TSH No results found for: HGBA1C No results found for: CHOL, HDL, LDLCALC, LDLDIRECT, TRIG, CHOLHDL  Significant Diagnostic Results in last 30 days:  US Venous Img Lower Unilateral Left  Result Date: 04/11/2017 CLINICAL DATA:  Left leg pain for 1 day. Status post left knee replacement on 04/08/2017. EXAM: LEFT LOWER EXTREMITY VENOUS DOPPLER ULTRASOUND TECHNIQUE: Gray-scale sonography with graded compression, as well as color Doppler and duplex ultrasound were performed to evaluate the lower extremity deep venous systems from the level of the common femoral vein and including the common femoral, femoral, profunda femoral, popliteal and calf veins including the posterior tibial, peroneal and gastrocnemius veins when visible. The superficial great saphenous vein was also interrogated. Spectral Doppler was utilized to evaluate flow at rest and with distal augmentation maneuvers in the common femoral, femoral and popliteal veins. COMPARISON:  None. FINDINGS: Contralateral Common Femoral Vein: Respiratory phasicity is normal and symmetric with the symptomatic side. No evidence of thrombus. Normal compressibility. Common Femoral Vein: No evidence of thrombus. Normal compressibility, respiratory phasicity and response to augmentation. Saphenofemoral Junction: No evidence of thrombus. Normal compressibility and flow on color Doppler imaging. Profunda Femoral Vein: No evidence of thrombus. Normal compressibility and flow on color  Doppler imaging. Femoral Vein: No evidence of thrombus. Normal compressibility, respiratory phasicity and response to augmentation. Popliteal Vein: No evidence of thrombus. Normal compressibility, respiratory phasicity and response to augmentation. Calf Veins: No evidence of thrombus. Normal compressibility and flow on color Doppler imaging. Superficial Great Saphenous Vein: No evidence of thrombus. Normal compressibility. Venous Reflux:  None. Other Findings:  None. IMPRESSION: No evidence of deep venous thrombosis. Electronically Signed   By: Claudie Revering M.D.   On: 04/11/2017 14:10   US Venous Img Lower Unilateral Right  Result Date: 03/19/2017 CLINICAL DATA:  Right lower extremity pain and edema with elevated D-dimer EXAM: Right LOWER EXTREMITY VENOUS DOPPLER ULTRASOUND TECHNIQUE: Gray-scale sonography with graded compression, as well as color Doppler and duplex ultrasound were performed to evaluate the lower extremity deep venous systems from the level of the common femoral vein and including the common femoral, femoral, profunda femoral, popliteal and calf veins including the posterior tibial, peroneal and gastrocnemius veins when visible. The superficial great saphenous vein was also interrogated. Spectral Doppler was utilized to evaluate flow at rest and with distal augmentation maneuvers in the common femoral, femoral and popliteal veins. COMPARISON:  02/27/2007 FINDINGS: Contralateral Common Femoral Vein: Respiratory phasicity is normal and symmetric with the symptomatic side. No evidence of thrombus. Normal compressibility. Common Femoral Vein: No evidence of thrombus. Normal compressibility, respiratory phasicity and response to augmentation. Saphenofemoral Junction: No evidence of thrombus. Normal compressibility and flow on color Doppler imaging. Profunda Femoral Vein: No evidence of thrombus. Normal compressibility and flow on color Doppler imaging. Femoral Vein: No evidence of thrombus. Normal  compressibility, respiratory phasicity and response to augmentation. Popliteal Vein: No evidence of thrombus. Normal compressibility, respiratory phasicity and response to augmentation. Calf Veins: No evidence of thrombus. Suboptimal visualization due to edema. Other Findings:  None. IMPRESSION: No evidence of DVT within the right lower extremity. Electronically Signed   By: Donavan Foil M.D.   On: 03/19/2017 18:48   Dg Chest [facility]  1 View  Result Date: 04/10/2017 CLINICAL DATA:  Two days of fever, history of left breast malignancy. Nonsmoker. EXAM: PORTABLE CHEST 1 VIEW COMPARISON:  Chest x-ray of March 12, 2017 FINDINGS: The lungs are adequately inflated. There is no focal infiltrate. There is no pleural effusion. The cardiac silhouette is mildly enlarged. The pulmonary vascularity is normal. There is calcification in the wall of the aortic arch in there is tortuosity of the descending thoracic aorta. The observed bony thorax exhibits no acute abnormality. IMPRESSION: There is no active cardiopulmonary disease. Thoracic aortic atherosclerosis. Electronically Signed   By: David  Martinique M.D.   On: 04/10/2017 15:09   Dg Knee Left Port  Result Date: 04/08/2017 CLINICAL DATA:  76 year old female post left knee replacement. Initial encounter. EXAM: PORTABLE LEFT KNEE - 1-2 VIEW COMPARISON:  None. FINDINGS: Post total left knee replacement which appears in satisfactory position without complication noted. Resurfacing of the patella. IMPRESSION: Total left knee replacement in satisfactory position without complication noted. Electronically Signed   By: Genia Del M.D.   On: 04/08/2017 12:36    Assessment/Plan 1.  Cellulitis of left lower extremity  Rocephin 1 g IM x1 now  Cephalexin 500 mg p.o. 4 times daily times 7 days  2.  Primary osteoarthritis of left knee 3.  Status post total knee replacement using cement, left  Continue PT/OT  Continue exercises as taught by PT/OT  Ice/Polar Care to  the knee at all times for pain and edema  Lovenox 40 mg subcu daily for DVT prophylaxis  Continue acetaminophen 500 mg p.o. 4 times daily scheduled for pain  Continue oxycodone 5 mg tablets 1-2 tabs p.o. every 4 hours as needed pain  LLE duplex Doppler, rule out DVT  Skin/incision care per protocol  Follow-up with orthopedist as instructed for continuity of care  Family/ staff Communication:   Total Time:  Documentation:  Face to Face:  Family/Phone:   Labs/tests ordered: L LE duplex Doppler  Medication list reviewed and assessed for continued appropriateness.  Vikki Ports, NP-C Geriatrics [facility] [phone], [place] [address] Cell Phone (Mon-Fri 8am-5pm):  [phone] On Call:  [phone] & follow prompts after 5pm & weekends Office Phone:  [phone] Office Fax:  [phone]

## 2017-04-23 ENCOUNTER — Encounter: Payer: Self-pay | Admitting: Gerontology

## 2017-04-23 NOTE — Progress Notes (Deleted)
Location:   [facility] Room Number: 201B Place of Service:  SNF [phone]) Provider:  Toni Arthurs, NP-C  Glendon Axe, MD  Patient Care Team: Glendon Axe, MD as PCP - General (Internal Medicine)  Extended Emergency Contact Information Primary Emergency Contact: [facility] Address: [address]          [place], [place] [address] Johnnette Litter of [place] Phone: [phone] Relation: Daughter Secondary Emergency Contact: [place], [place] [address] Johnnette Litter of [place] Phone: [phone] Relation: Son  Code Status:  FULL Goals of care: Advanced Directive information Advanced Directives 04/23/2017  Does Patient Have a Medical Advance Directive? No  Would patient like information on creating a medical advance directive? -     Chief Complaint  Patient presents with  . Medical Management of Chronic Issues    Routine visit    HPI:  Pt is a 76 y.o. female seen today for medical management of chronic diseases.     Past Medical History:  Diagnosis Date  . Acquired hammertoe    other  . Angina pectoris, unspecified ([place])   . Arthritis    knees  . Cancer ([place]) 1990,s   left breast  . Degenerative joint disease   . Dermatophytosis of nail   . Diabetes mellitus type 2, uncomplicated ([place])   . Essential hypertension, benign   . Exostosis    unspecified site  . Fracture of right ankle 2010   Followed by Dr. Vickki Muff  . Hypercholesterolemia   . Hyperlipidemia, unspecified   . Hypertension   . Lymphedema of leg    left leg worse than right leg.  . Obesity, unspecified   . Osteoarthrosis involving lower leg    unspecified whether generalized or localized   . Pre-diabetes   . Pure hypercholesterolemia   . Vitamin D deficiency, unspecified    Past Surgical History:  Procedure Laterality Date  . BREAST SURGERY Left 1990   lumpectomy  . BUNIONECTOMY Bilateral 1990's   plus hammer toe repair on left  foot.  . ENDOMETRIAL BIOPSY    . TONSILLECTOMY    . TOTAL KNEE ARTHROPLASTY Left 04/08/2017   Procedure: TOTAL KNEE ARTHROPLASTY;  Surgeon: Corky Mull, MD;  Location: [facility] ORS;  Service: Orthopedics;  Laterality: Left;    Allergies  Allergen Reactions  . Colesevelam Other (See Comments)    constipation  . Oxybutynin Swelling    Leg swelling  . Rosuvastatin Other (See Comments)    Pain in lower extremities    Allergies as of 04/23/2017      Reactions   Colesevelam Other (See Comments)   constipation   Oxybutynin Swelling   Leg swelling   Rosuvastatin Other (See Comments)   Pain in lower extremities      Medication List       Accurate as of 04/23/17 12:42 PM. Always use your most recent med list.          acetaminophen 500 MG tablet Commonly known as:  TYLENOL Take 500 mg by mouth 4 (four) times daily.   atorvastatin 20 MG tablet Commonly known as:  LIPITOR Take 20 mg by mouth at bedtime.   enoxaparin 40 MG/0.4ML injection Commonly known as:  LOVENOX Inject 0.4 mLs (40 mg total) into the skin daily.   gabapentin 300 MG capsule Commonly known as:  NEURONTIN Take 300 mg by mouth at bedtime.   losartan 25 MG tablet Commonly known  as:  COZAAR Take 25 mg by mouth every morning.   magnesium oxide 400 MG tablet Commonly known as:  MAG-OX Take 400 mg by mouth daily.   nystatin cream Commonly known as:  MYCOSTATIN Apply 1 application topically 2 (two) times daily. Apply thin film to yeast rash under the breasts   Omega-3 Krill Oil 500 MG Caps Take 500 mg by mouth daily.   omeprazole 20 MG capsule Commonly known as:  PRILOSEC Take 20 mg by mouth at bedtime as needed. Acid reflux   oxyCODONE 5 MG immediate release tablet Commonly known as:  Oxy IR/ROXICODONE Take 1-2 tablets (5-10 mg total) by mouth every 4 (four) hours as needed for breakthrough pain.   potassium chloride 10 MEQ tablet Commonly known as:  K-DUR,KLOR-CON Take 10 mEq by mouth daily.     sennosides-docusate sodium 8.6-50 MG tablet Commonly known as:  SENOKOT-S Take 2 tablets by mouth 2 (two) times daily.   solifenacin 5 MG tablet Commonly known as:  VESICARE Take 5 mg by mouth daily.   TOPROL XL 25 MG 24 hr tablet Generic drug:  metoprolol succinate Take 25 mg by mouth at bedtime.   torsemide 20 MG tablet Commonly known as:  DEMADEX Take 20 mg by mouth daily as needed (fluid).   VOLTAREN 1 % Gel Generic drug:  diclofenac sodium Apply 1 application topically 3 (three) times daily as needed for pain.       Review of Systems  Immunization History  Administered Date(s) Administered  . Influenza Split 05/01/2015  . Influenza-Unspecified 03/13/2016  . Pneumococcal Polysaccharide-23 04/09/2017   Pertinent  Health Maintenance Due  Topic Date Due  . INFLUENZA VACCINE  01/29/2017  . PNA vac Low Risk Adult (2 of 2 - PCV13) 04/09/2018  . DEXA SCAN  Completed   No flowsheet data found. Functional Status Survey:    Vitals:   04/23/17 1221  BP: (!) 176/96  Pulse: 80  Resp: 20  Temp: 98.3 F (36.8 C)  SpO2: 97%  Weight: 242 lb (109.8 kg)  Height: 5\' 9"  (1.753 m)   Body mass index is 35.74 kg/m. Physical Exam  Labs reviewed:  Recent Labs  04/10/17 0343 04/11/17 0341 04/12/17 0337  NA 136 138 139  K 4.1 3.9 4.0  CL 108 108 107  CO2 24 27 28   GLUCOSE 120* 146* 138*  BUN 14 11 16   CREATININE 1.00 0.83 1.02*  CALCIUM 9.1 9.2 9.2   No results for input(s): AST, ALT, ALKPHOS, BILITOT, PROT, ALBUMIN in the last 8760 hours.  Recent Labs  04/09/17 0417 04/10/17 0343 04/11/17 0341 04/12/17 0337  WBC 8.2 8.4 6.6 5.3  NEUTROABS 6.0  --   --   --   HGB 11.5* 12.5 12.2 10.2*  HCT 33.4* 36.6 36.1 30.0*  MCV 89.6 89.7 90.1 90.1  PLT 153 143* 119* 116*   No results found for: TSH No results found for: HGBA1C No results found for: CHOL, HDL, LDLCALC, LDLDIRECT, TRIG, CHOLHDL  Significant Diagnostic Results in last 30 days:  US Venous Img  Lower Unilateral Left  Result Date: 04/11/2017 CLINICAL DATA:  Left leg pain for 1 day. Status post left knee replacement on 04/08/2017. EXAM: LEFT LOWER EXTREMITY VENOUS DOPPLER ULTRASOUND TECHNIQUE: Gray-scale sonography with graded compression, as well as color Doppler and duplex ultrasound were performed to evaluate the lower extremity deep venous systems from the level of the common femoral vein and including the common femoral, femoral, profunda femoral, popliteal and calf veins  including the posterior tibial, peroneal and gastrocnemius veins when visible. The superficial great saphenous vein was also interrogated. Spectral Doppler was utilized to evaluate flow at rest and with distal augmentation maneuvers in the common femoral, femoral and popliteal veins. COMPARISON:  None. FINDINGS: Contralateral Common Femoral Vein: Respiratory phasicity is normal and symmetric with the symptomatic side. No evidence of thrombus. Normal compressibility. Common Femoral Vein: No evidence of thrombus. Normal compressibility, respiratory phasicity and response to augmentation. Saphenofemoral Junction: No evidence of thrombus. Normal compressibility and flow on color Doppler imaging. Profunda Femoral Vein: No evidence of thrombus. Normal compressibility and flow on color Doppler imaging. Femoral Vein: No evidence of thrombus. Normal compressibility, respiratory phasicity and response to augmentation. Popliteal Vein: No evidence of thrombus. Normal compressibility, respiratory phasicity and response to augmentation. Calf Veins: No evidence of thrombus. Normal compressibility and flow on color Doppler imaging. Superficial Great Saphenous Vein: No evidence of thrombus. Normal compressibility. Venous Reflux:  None. Other Findings:  None. IMPRESSION: No evidence of deep venous thrombosis. Electronically Signed   By: Claudie Revering M.D.   On: 04/11/2017 14:10   Dg Chest Port 1 View  Result Date: 04/10/2017 CLINICAL DATA:  Two  days of fever, history of left breast malignancy. Nonsmoker. EXAM: PORTABLE CHEST 1 VIEW COMPARISON:  Chest x-ray of March 12, 2017 FINDINGS: The lungs are adequately inflated. There is no focal infiltrate. There is no pleural effusion. The cardiac silhouette is mildly enlarged. The pulmonary vascularity is normal. There is calcification in the wall of the aortic arch in there is tortuosity of the descending thoracic aorta. The observed bony thorax exhibits no acute abnormality. IMPRESSION: There is no active cardiopulmonary disease. Thoracic aortic atherosclerosis. Electronically Signed   By: David  Martinique M.D.   On: 04/10/2017 15:09   Dg Knee Left Port  Result Date: 04/08/2017 CLINICAL DATA:  76 year old female post left knee replacement. Initial encounter. EXAM: PORTABLE LEFT KNEE - 1-2 VIEW COMPARISON:  None. FINDINGS: Post total left knee replacement which appears in satisfactory position without complication noted. Resurfacing of the patella. IMPRESSION: Total left knee replacement in satisfactory position without complication noted. Electronically Signed   By: Genia Del M.D.   On: 04/08/2017 12:36    Assessment/Plan There are no diagnoses linked to this encounter.   Family/ staff Communication: ***  Total Time:  Documentation:  Face to Face:  Family/Phone:   Labs/tests ordered:  ***  Medication list reviewed and assessed for continued appropriateness. Monthly medication orders reviewed and signed.  Vikki Ports, NP-C Geriatrics [facility] [phone], [place] [address] Cell Phone (Mon-Fri 8am-5pm):  [phone] On Call:  [phone] & follow prompts after 5pm & weekends Office Phone:  [phone] Office Fax:  [phone]

## 2017-04-30 NOTE — Progress Notes (Signed)
This encounter was created in error - please disregard.

## 2017-05-10 DIAGNOSIS — I110 Hypertensive heart disease with heart failure: Secondary | ICD-10-CM | POA: Insufficient documentation

## 2017-05-27 ENCOUNTER — Ambulatory Visit: Admission: RE | Admit: 2017-05-27 | Payer: Medicare Other | Source: Ambulatory Visit | Admitting: Gastroenterology

## 2017-05-27 ENCOUNTER — Encounter: Admission: RE | Payer: Self-pay | Source: Ambulatory Visit

## 2017-05-27 SURGERY — COLONOSCOPY WITH PROPOFOL
Anesthesia: General

## 2017-05-28 ENCOUNTER — Ambulatory Visit (INDEPENDENT_AMBULATORY_CARE_PROVIDER_SITE_OTHER): Payer: Medicare Other | Admitting: Vascular Surgery

## 2017-05-28 ENCOUNTER — Encounter (INDEPENDENT_AMBULATORY_CARE_PROVIDER_SITE_OTHER): Payer: Medicare Other

## 2017-07-02 ENCOUNTER — Encounter (INDEPENDENT_AMBULATORY_CARE_PROVIDER_SITE_OTHER): Payer: Self-pay | Admitting: Vascular Surgery

## 2017-07-02 ENCOUNTER — Other Ambulatory Visit (INDEPENDENT_AMBULATORY_CARE_PROVIDER_SITE_OTHER): Payer: Self-pay | Admitting: Vascular Surgery

## 2017-07-02 ENCOUNTER — Ambulatory Visit (INDEPENDENT_AMBULATORY_CARE_PROVIDER_SITE_OTHER): Payer: Medicare Other | Admitting: Vascular Surgery

## 2017-07-02 ENCOUNTER — Ambulatory Visit (INDEPENDENT_AMBULATORY_CARE_PROVIDER_SITE_OTHER): Payer: Medicare Other

## 2017-07-02 VITALS — BP 186/83 | HR 66 | Resp 15 | Ht 69.0 in | Wt 234.0 lb

## 2017-07-02 DIAGNOSIS — I872 Venous insufficiency (chronic) (peripheral): Secondary | ICD-10-CM | POA: Insufficient documentation

## 2017-07-02 DIAGNOSIS — R600 Localized edema: Secondary | ICD-10-CM | POA: Diagnosis not present

## 2017-07-02 DIAGNOSIS — I890 Lymphedema, not elsewhere classified: Secondary | ICD-10-CM | POA: Diagnosis not present

## 2017-07-02 DIAGNOSIS — I739 Peripheral vascular disease, unspecified: Secondary | ICD-10-CM

## 2017-07-02 NOTE — Progress Notes (Signed)
Subjective:    Patient ID: [name], female    DOB: [birth year], 77 y.o.   MRN: [id number] Chief Complaint  Patient presents with  . Follow-up    1 month ABI BLE venous reflux   Patient presents today to review vascular studies.  The patient was seen approximately 3 months ago for evaluation of bilateral lower extremity pain and swelling.  The patient's symptoms are stable.  The patient underwent a bilateral ABI which was notable for triphasic tibials and no significant lower extremity arterial disease.  The bilateral toe brachial indices are normal.  The patient underwent a bilateral lower extremity venous reflux exam which was notable for venous incompetence in the right common femoral vein, saphenous femoral junction, femoral vein, popliteal vein and the great saphenous vein of the right lower extremity.  Venous incompetence noted in the left common femoral vein, saphenous femoral junction, femoral vein and great saphenous vein of the left lower extremity.  No evidence of deep or superficial vein thrombosis in the bilateral lower extremities.  The patient's symptoms have progressed to the point she is unable to function on a daily basis.  Her symptoms are lifestyle limiting.  Over the last 3 months, the patient has been engaging in conservative therapy including wearing medical grade 1 compression stockings, elevating her legs and remaining active with minimal improvement in her symptoms requiring the use of over-the-counter anti-inflammatories.  The patient denies any fever, nausea vomiting.   Review of Systems  Constitutional: Negative.   HENT: Negative.   Eyes: Negative.   Respiratory: Negative.   Cardiovascular: Positive for leg swelling.       Lower extremity pain  Gastrointestinal: Negative.   Endocrine: Negative.   Genitourinary: Negative.   Musculoskeletal: Negative.   Skin: Negative.   Allergic/Immunologic: Negative.   Neurological: Negative.   Hematological: Negative.     Psychiatric/Behavioral: Negative.       Objective:   Physical Exam  Constitutional: She is oriented to person, place, and time. She appears well-developed and well-nourished. No distress.  HENT:  Head: Normocephalic and atraumatic.  Eyes: Conjunctivae are normal. Pupils are equal, round, and reactive to light.  Neck: Normal range of motion.  Cardiovascular: Normal rate, regular rhythm, normal heart sounds and intact distal pulses.  Pulses:      Radial pulses are 2+ on the right side, and 2+ on the left side.  Hard to palpate pedal pulses due to edema however her bilateral feet are warm  Pulmonary/Chest: Effort normal and breath sounds normal.  Musculoskeletal: Normal range of motion. She exhibits edema (Moderate nonpitting edema noted bilaterally).  Neurological: She is alert and oriented to person, place, and time.  Skin: Skin is warm and dry. She is not diaphoretic.  Psychiatric: She has a normal mood and affect. Her behavior is normal. Judgment and thought content normal.  Vitals reviewed.  BP (!) 186/83 (BP Location: Right Arm, Patient Position: Sitting)   Pulse 66   Resp 15   Ht 5\' 9"  (1.753 m)   Wt 234 lb (106.1 kg)   BMI 34.56 kg/m   Past Medical History:  Diagnosis Date  . Acquired hammertoe    other  . Angina pectoris, unspecified ([place])   . Arthritis    knees  . Cancer ([place]) 1990,s   left breast  . Degenerative joint disease   . Dermatophytosis of nail   . Diabetes mellitus type 2, uncomplicated ([place])   . Essential hypertension, benign   . Exostosis  unspecified site  . Fracture of right ankle 2010   Followed by Dr. Vickki Muff  . Hypercholesterolemia   . Hyperlipidemia, unspecified   . Hypertension   . Lymphedema of leg    left leg worse than right leg.  . Obesity, unspecified   . Osteoarthrosis involving lower leg    unspecified whether generalized or localized   . Pre-diabetes   . Pure hypercholesterolemia   . Vitamin D deficiency, unspecified     Social History   Socioeconomic History  . Marital status: Single    Spouse name: Not on file  . Number of children: 2  . Years of education: Not on file  . Highest education level: Not on file  Social Needs  . Financial resource strain: Not on file  . Food insecurity - worry: Not on file  . Food insecurity - inability: Not on file  . Transportation needs - medical: Not on file  . Transportation needs - non-medical: Not on file  Occupational History  . Not on file  Tobacco Use  . Smoking status: Never Smoker  . Smokeless tobacco: Never Used  Substance and Sexual Activity  . Alcohol use: Yes    Comment: 1-2 drinks every 1-2 months  . Drug use: No  . Sexual activity: Not on file  Other Topics Concern  . Not on file  Social History Narrative  . Not on file   Past Surgical History:  Procedure Laterality Date  . BREAST SURGERY Left 1990   lumpectomy  . BUNIONECTOMY Bilateral 1990's   plus hammer toe repair on left foot.  . ENDOMETRIAL BIOPSY    . TONSILLECTOMY    . TOTAL KNEE ARTHROPLASTY Left 04/08/2017   Procedure: TOTAL KNEE ARTHROPLASTY;  Surgeon: Corky Mull, MD;  Location: [facility] ORS;  Service: Orthopedics;  Laterality: Left;   Family History  Problem Relation Age of Onset  . Stroke Mother   . Hypertension Mother   . Cancer Father    Allergies  Allergen Reactions  . Colesevelam Other (See Comments)    constipation  . Oxybutynin Swelling    Leg swelling  . Rosuvastatin Other (See Comments)    Pain in lower extremities      Assessment & Plan:  Patient presents today to review vascular studies.  The patient was seen approximately 3 months ago for evaluation of bilateral lower extremity pain and swelling.  The patient's symptoms are stable.  The patient underwent a bilateral ABI which was notable for triphasic tibials and no significant lower extremity arterial disease.  The bilateral toe brachial indices are normal.  The patient underwent a bilateral lower  extremity venous reflux exam which was notable for venous incompetence in the right common femoral vein, saphenous femoral junction, femoral vein, popliteal vein and the great saphenous vein of the right lower extremity.  Venous incompetence noted in the left common femoral vein, saphenous femoral junction, femoral vein and great saphenous vein of the left lower extremity.  No evidence of deep or superficial vein thrombosis in the bilateral lower extremities.  The patient's symptoms have progressed to the point she is unable to function on a daily basis.  Her symptoms are lifestyle limiting.  Over the last 3 months, the patient has been engaging in conservative therapy including wearing medical grade 1 compression stockings, elevating her legs and remaining active with minimal improvement in her symptoms requiring the use of over-the-counter anti-inflammatories.  The patient denies any fever, nausea vomiting.  1. Venous reflux - New  At this time, the patient is not interested in moving forward with laser ablation  2. Lymphedema - New Studies reviewed with the patient. Despite conservative treatments including exercise, elevation and class I compression stockings the patient still presents with stage I lymphedema The patient would greatly benefit from the added therapy of a lymphedema pump I will applied to the patient's insurance The patient is to continue engaging in conservative therapy until insurance approval is received The patient is to follow-up in 3 months so I can assess her progress with conservative therapy and the added therapy of a lymphedema pump  Current Outpatient Medications on File Prior to Visit  Medication Sig Dispense Refill  . acetaminophen (TYLENOL) 500 MG tablet Take 500 mg by mouth 4 (four) times daily.     Marland Kitchen atorvastatin (LIPITOR) 20 MG tablet Take 20 mg by mouth at bedtime.    . clotrimazole-betamethasone (LOTRISONE) cream Apply topically.    . enoxaparin (LOVENOX) 40  MG/0.4ML injection Inject 0.4 mLs (40 mg total) into the skin daily. 14 Syringe 0  . furosemide (LASIX) 40 MG tablet Take one tablet by mouth once daily as needed for lower extremity edema    . gabapentin (NEURONTIN) 300 MG capsule Take 300 mg by mouth at bedtime.    Marland Kitchen losartan (COZAAR) 25 MG tablet Take 25 mg by mouth every morning.     . magnesium oxide (MAG-OX) 400 MG tablet Take 400 mg by mouth daily.    Marland Kitchen nystatin cream (MYCOSTATIN) Apply 1 application topically 2 (two) times daily. Apply thin film to yeast rash under the breasts    . Omega-3 Krill Oil 500 MG CAPS Take 500 mg by mouth daily.    Marland Kitchen omeprazole (PRILOSEC) 20 MG capsule Take 20 mg by mouth at bedtime as needed. Acid reflux    . oxyCODONE (OXY IR/ROXICODONE) 5 MG immediate release tablet Take 1-2 tablets (5-10 mg total) by mouth every 4 (four) hours as needed for breakthrough pain. 30 tablet 0  . potassium chloride (K-DUR,KLOR-CON) 10 MEQ tablet Take 10 mEq by mouth daily.     . sennosides-docusate sodium (SENOKOT-S) 8.6-50 MG tablet Take 2 tablets by mouth 2 (two) times daily.    . solifenacin (VESICARE) 5 MG tablet Take 5 mg by mouth daily.     . TOPROL XL 25 MG 24 hr tablet Take 25 mg by mouth at bedtime.    . torsemide (DEMADEX) 20 MG tablet Take 20 mg by mouth daily as needed (fluid).    . traMADol (ULTRAM) 50 MG tablet Take by mouth.    . triamterene-hydrochlorothiazide (DYAZIDE) 37.5-25 MG capsule     . VOLTAREN 1 % GEL Apply 1 application topically 3 (three) times daily as needed for pain.     No current facility-administered medications on file prior to visit.    There are no Patient Instructions on file for this visit. No Follow-up on file.  Asmara Backs A Nigil Braman, PA-C

## 2017-07-16 NOTE — Progress Notes (Signed)
07/17/2017 3:10 PM   [name] Sep 23, 1940 [id number]  Referring provider: Glendon Axe, MD [facility] [place], [place] [address]  Chief Complaint  Patient presents with  . New Patient (Initial Visit)    HPI: Patient is a 77 -year-old [place] American female who is referred to [place] by Dr. Glendon Axe for urinary incontinence and nocturnal enuresis.    Patient states that she has had urinary incontinence for a long time.  Her biggest issue is that during the night she is getting up to urinate 3 to 4 times to urinate and finds that the pad has been soaked.  This has been going on for one year.    Patient has incontinence with urge during the day.  She has not noticed stress incontinence.  She is experiencing 2 to 3 incontinent episodes during the day. She is experiencing incontinent episodes during the night.  Her incontinence volume is moderate.  She is wearing one to two pads/depends daily.    She is having associated urinary frequency x 5-6 and strong urgency.  She does not have a history of urinary tract infections, STI's or injury to the bladder.   She denies dysuria, gross hematuria, suprapubic pain, back pain, abdominal pain or flank pain.  She has not had any recent fevers, chills, nausea or vomiting.    She does not have a history of nephrolithiasis, GU surgery or GU trauma.   She is not sexually active.  She is post menopausal.   She admits to constipation.    She had a RUS in 09/2016 which noted mild renal atrophy especially on the right without significant cortical echotexture change. No hydronephrosis.  She is drinking several bottles of water daily.   She is drinking one caffeinated beverages daily.  She is not drinking alcoholic beverages daily.     Reviewed referral notes.    PMH: Past Medical History:  Diagnosis Date  . Acquired hammertoe    other  . Angina pectoris, unspecified ([place])   . Arthritis    knees  . Cancer  ([place]) 1990,s   left breast  . Degenerative joint disease   . Dermatophytosis of nail   . Diabetes mellitus type 2, uncomplicated ([place])   . Essential hypertension, benign   . Exostosis    unspecified site  . Fracture of right ankle 2010   Followed by Dr. Vickki Muff  . Hypercholesterolemia   . Hyperlipidemia, unspecified   . Hypertension   . Lymphedema of leg    left leg worse than right leg.  . Obesity, unspecified   . Osteoarthrosis involving lower leg    unspecified whether generalized or localized   . Pre-diabetes   . Pure hypercholesterolemia   . Vitamin D deficiency, unspecified     Surgical History: Past Surgical History:  Procedure Laterality Date  . BREAST SURGERY Left 1990   lumpectomy  . BUNIONECTOMY Bilateral 1990's   plus hammer toe repair on left foot.  . ENDOMETRIAL BIOPSY    . TONSILLECTOMY    . TOTAL KNEE ARTHROPLASTY Left 04/08/2017   Procedure: TOTAL KNEE ARTHROPLASTY;  Surgeon: Corky Mull, MD;  Location: [facility] ORS;  Service: Orthopedics;  Laterality: Left;    Home Medications:  Allergies as of 07/17/2017      Reactions   Colesevelam Other (See Comments)   constipation   Oxybutynin Swelling   Leg swelling   Rosuvastatin Other (See Comments)   Pain in lower extremities  Medication List        Accurate as of 07/17/17  3:10 PM. Always use your most recent med list.          acetaminophen 500 MG tablet Commonly known as:  TYLENOL Take 500 mg by mouth 4 (four) times daily.   aspirin EC 81 MG tablet Take 81 mg by mouth daily.   atorvastatin 20 MG tablet Commonly known as:  LIPITOR Take 20 mg by mouth [name] bedtime.   clotrimazole-betamethasone cream Commonly known as:  LOTRISONE Apply topically.   enoxaparin 40 MG/0.4ML injection Commonly known as:  LOVENOX Inject 0.4 mLs (40 mg total) into the skin daily.   furosemide 40 MG tablet Commonly known as:  LASIX Take one tablet by mouth once daily as needed for lower extremity edema     gabapentin 300 MG capsule Commonly known as:  NEURONTIN Take 300 mg by mouth [name] bedtime.   losartan 25 MG tablet Commonly known as:  COZAAR Take 25 mg by mouth every morning.   magnesium oxide 400 MG tablet Commonly known as:  MAG-OX Take 400 mg by mouth daily.   nystatin cream Commonly known as:  MYCOSTATIN Apply 1 application topically 2 (two) times daily. Apply thin film to yeast rash under the breasts   Omega-3 Krill Oil 500 MG Caps Take 500 mg by mouth daily.   omeprazole 20 MG capsule Commonly known as:  PRILOSEC Take 20 mg by mouth [name] bedtime as needed. Acid reflux   oxyCODONE 5 MG immediate release tablet Commonly known as:  Oxy IR/ROXICODONE Take 1-2 tablets (5-10 mg total) by mouth every 4 (four) hours as needed for breakthrough pain.   potassium chloride 10 MEQ tablet Commonly known as:  K-DUR,KLOR-CON Take 10 mEq by mouth daily.   sennosides-docusate sodium 8.6-50 MG tablet Commonly known as:  SENOKOT-S Take 2 tablets by mouth 2 (two) times daily.   solifenacin 10 MG tablet Commonly known as:  VESICARE Take 1 tablet (10 mg total) by mouth daily.   TOPROL XL 25 MG 24 hr tablet Generic drug:  metoprolol succinate Take 25 mg by mouth [name] bedtime.   torsemide 20 MG tablet Commonly known as:  DEMADEX Take 20 mg by mouth daily as needed (fluid).   traMADol 50 MG tablet Commonly known as:  ULTRAM Take by mouth.   triamterene-hydrochlorothiazide 37.5-25 MG capsule Commonly known as:  DYAZIDE   VOLTAREN 1 % Gel Generic drug:  diclofenac sodium Apply 1 application topically 3 (three) times daily as needed for pain.       Allergies:  Allergies  Allergen Reactions  . Colesevelam Other (See Comments)    constipation  . Oxybutynin Swelling    Leg swelling  . Rosuvastatin Other (See Comments)    Pain in lower extremities    Family History: Family History  Problem Relation Age of Onset  . Stroke Mother   . Hypertension Mother   . Cancer Father    . Kidney cancer Neg Hx   . Kidney disease Neg Hx   . Prostate cancer Neg Hx     Social History:  reports that  has never smoked. she has never used smokeless tobacco. She reports that she drinks alcohol. She reports that she does not use drugs.  ROS: UROLOGY Frequent Urination?: Yes Hard to postpone urination?: Yes Burning/pain with urination?: No Get up [name] night to urinate?: Yes Leakage of urine?: Yes Urine stream starts and stops?: No Trouble starting stream?: No Do you have to  strain to urinate?: No Blood in urine?: No Urinary tract infection?: No Sexually transmitted disease?: No Injury to kidneys or bladder?: No Painful intercourse?: No Weak stream?: No Currently pregnant?: No Vaginal bleeding?: No Last menstrual period?: n  Gastrointestinal Nausea?: No Vomiting?: No Indigestion/heartburn?: No Diarrhea?: No Constipation?: No  Constitutional Fever: No Night sweats?: No Weight loss?: Yes Fatigue?: Yes  Skin Skin rash/lesions?: No Itching?: No  Eyes Blurred vision?: No Double vision?: No  Ears/Nose/Throat Sore throat?: No Sinus problems?: No  Hematologic/Lymphatic Swollen glands?: No Easy bruising?: Yes  Cardiovascular Leg swelling?: Yes Chest pain?: Yes  Respiratory Cough?: Yes Shortness of breath?: No  Endocrine Excessive thirst?: No  Musculoskeletal Back pain?: No Joint pain?: Yes  Neurological Headaches?: No Dizziness?: No  Psychologic Depression?: No Anxiety?: No  Physical Exam: BP (!) 174/85   Pulse 96   Ht 5\' 9"  (1.753 m)   Wt 233 lb (105.7 kg)   BMI 34.41 kg/m   Constitutional: Well nourished. Alert and oriented, No acute distress. HEENT: [name], moist mucus membranes. Trachea midline, no masses. Cardiovascular: No clubbing, cyanosis, or edema. Respiratory: Normal respiratory effort, no increased work of breathing. GI: Abdomen is soft, non tender, non distended, no abdominal masses. Liver and spleen not palpable.   No hernias appreciated.  Stool sample for occult testing is not indicated.   GU: No CVA tenderness.  No bladder fullness or masses.  Atrophic external genitalia, normal pubic hair distribution, no lesions.  Normal urethral meatus, no lesions, no prolapse, no discharge.   No urethral masses, tenderness and/or tenderness. No bladder fullness, tenderness or masses. Pale vagina mucosa, poor estrogen effect, no discharge, no lesions, good pelvic support, Grade II cystocele noted.  No rectocele noted.  No cervical motion tenderness.  Uterus is freely mobile and non-fixed.  No adnexal/parametria masses or tenderness noted.  Anus and perineum are without rashes or lesions.    Skin: No rashes, bruises or suspicious lesions. Lymph: No cervical or inguinal adenopathy. Neurologic: Grossly intact, no focal deficits, moving all 4 extremities. Psychiatric: Normal mood and affect.  Laboratory Data: Lab Results  Component Value Date   WBC 5.3 04/12/2017   HGB 10.2 (L) 04/12/2017   HCT 30.0 (L) 04/12/2017   MCV 90.1 04/12/2017   PLT 116 (L) 04/12/2017    Lab Results  Component Value Date   CREATININE 1.02 (H) 04/12/2017    No results found for: PSA  No results found for: TESTOSTERONE  No results found for: HGBA1C  No results found for: TSH  No results found for: CHOL, HDL, CHOLHDL, VLDL, LDLCALC  No results found for: AST No results found for: ALT No components found for: ALKALINEPHOPHATASE No components found for: BILIRUBINTOTAL  No results found for: ESTRADIOL  Urinalysis    Component Value Date/Time   COLORURINE YELLOW (A) 04/10/2017 1315   APPEARANCEUR CLEAR (A) 04/10/2017 1315   LABSPEC 1.008 04/10/2017 1315   PHURINE 6.0 04/10/2017 1315   [place] 04/10/2017 1315   [place] 04/10/2017 [place] 04/10/2017 [place] 04/10/2017 [place] 04/10/2017 1315   NITRITE NEGATIVE 04/10/2017 1315   LEUKOCYTESUR NEGATIVE  04/10/2017 1315    I have reviewed the labs.   Pertinent Imaging: CLINICAL DATA:  Chronic renal insufficiency stage III  EXAM: RENAL / URINARY TRACT ULTRASOUND COMPLETE  COMPARISON:  CT scan of the abdomen and pelvis of December 20, 2008  FINDINGS: Right Kidney:  Length: 9.4 cm. The renal cortical  echotexture remains lower than that of the adjacent liver. There is no hydronephrosis.  Left Kidney:  Length: 10.7 cm. Visualization of the left kidney was somewhat limited due to the patient's body habitus and bowel gas. The renal cortical echotexture was grossly similar to that on the right. No hydronephrosis was observed.  Bladder:  The partially distended urinary bladder was normal.  IMPRESSION: Mild renal atrophy especially on the right without significant cortical echotexture change. No hydronephrosis.   Electronically Signed   By: David  Martinique M.D.   On: 10/28/2016 15:13  I have independently reviewed the films.   Results for SARIN, COMUNALE (MRN [id number]) as of 07/21/2017 20:30  Ref. Range 07/17/2017 14:32  Scan Result Unknown 47    Assessment & Plan:    1. Mixed Incontinence  - offered behavioral therapies, bladder training, bladder control strategies and pelvic floor muscle training - refer to PT  - fluid management - some water restriction   - patient feels that she has had some success with Vesicare 5 mg daily and would like to see if an increased dose will be more effective  - offered refer to gynecology for a pessary fitting - patient deferred  - RTC in 6 weeks for PVR and symptom recheck   2. Cystocele  - see above  3. Vaginal atrophy  - patient is not a candidate for vaginal estrogen cream due to her history of breast cancer  Return in about 6 weeks (around 08/28/2017) for OAB questionnaire, PVR and exam.  These notes generated with voice recognition software. I apologize for typographical errors.  Zara Council, [place]  [facility] [address], [place], [place] [address] [phone]

## 2017-07-17 ENCOUNTER — Encounter: Payer: Self-pay | Admitting: Urology

## 2017-07-17 ENCOUNTER — Ambulatory Visit (INDEPENDENT_AMBULATORY_CARE_PROVIDER_SITE_OTHER): Payer: Medicare Other | Admitting: Urology

## 2017-07-17 VITALS — BP 174/85 | HR 96 | Ht 69.0 in | Wt 233.0 lb

## 2017-07-17 DIAGNOSIS — N8111 Cystocele, midline: Secondary | ICD-10-CM | POA: Diagnosis not present

## 2017-07-17 DIAGNOSIS — N3946 Mixed incontinence: Secondary | ICD-10-CM

## 2017-07-17 DIAGNOSIS — N952 Postmenopausal atrophic vaginitis: Secondary | ICD-10-CM

## 2017-07-17 LAB — BLADDER SCAN AMB NON-IMAGING: Scan Result: 47

## 2017-07-17 MED ORDER — SOLIFENACIN SUCCINATE 10 MG PO TABS: 10.0000 mg | ORAL_TABLET | Freq: Every day | ORAL | 3 refills | Status: DC

## 2017-07-28 ENCOUNTER — Other Ambulatory Visit: Payer: Self-pay

## 2017-07-28 ENCOUNTER — Ambulatory Visit: Payer: Medicare Other | Attending: Urology | Admitting: Physical Therapy

## 2017-07-28 DIAGNOSIS — M6281 Muscle weakness (generalized): Secondary | ICD-10-CM | POA: Diagnosis present

## 2017-07-28 DIAGNOSIS — R278 Other lack of coordination: Secondary | ICD-10-CM | POA: Diagnosis present

## 2017-07-28 NOTE — Patient Instructions (Signed)
  Proper body mechanics with getting out of a chair to decrease strain  on back &pelvic floor   Avoid holding your breath when Getting out of the chair:  Scoot to front part of chair chair Heels behind feet, feet are hip width apart, nose over toes  Inhale like you are smelling roses Exhale to stand     _________ 2-3 x day  Recline on pillows so head is above heart:    Deep core level 1 ( handout ) with quick squeeze)    Deep core level 2 ( handout)

## 2017-07-28 NOTE — Therapy (Signed)
[place] [facility] SERVICES [address] [place], [place], [address] Phone: [phone]   Fax:  [phone]  Physical Therapy Evaluation  Patient Details  Name: [name] MRN: [id number] Date of Birth: 11-21-1940 Referring Provider: Ernestine Conrad    Encounter Date: 07/28/2017  PT End of Session - 07/28/17 1514    Visit Number  1    Number of Visits  3    PT Start Time  7867    PT Stop Time  1503    PT Time Calculation (min)  60 min       Past Medical History:  Diagnosis Date  . Acquired hammertoe    other  . Angina pectoris, unspecified ([place])   . Arthritis    knees  . Cancer ([place]) 1990,s   left breast  . Degenerative joint disease   . Dermatophytosis of nail   . Diabetes mellitus type 2, uncomplicated ([place])   . Essential hypertension, benign   . Exostosis    unspecified site  . Fracture of right ankle 2010   Followed by Dr. Vickki Muff  . Hypercholesterolemia   . Hyperlipidemia, unspecified   . Hypertension   . Lymphedema of leg    left leg worse than right leg.  . Obesity, unspecified   . Osteoarthrosis involving lower leg    unspecified whether generalized or localized   . Pre-diabetes   . Pure hypercholesterolemia   . Vitamin D deficiency, unspecified     Past Surgical History:  Procedure Laterality Date  . BREAST SURGERY Left 1990   lumpectomy  . BUNIONECTOMY Bilateral 1990's   plus hammer toe repair on left foot.  . ENDOMETRIAL BIOPSY    . TONSILLECTOMY    . TOTAL KNEE ARTHROPLASTY Left 04/08/2017   Procedure: TOTAL KNEE ARTHROPLASTY;  Surgeon: Corky Mull, MD;  Location: [facility] ORS;  Service: Orthopedics;  Laterality: Left;    There were no vitals filed for this visit.   Subjective Assessment - 07/28/17 1414    Subjective  Pt has had urinary leakage and wearing pads for 2 years. Pt is trying to improve on getting to the bathroom when she needs to go and not wait. Pt has leakage by the time she makes it to  the bathroom when she wakes up at night.  Pt wakes up 3x night but sometimes, it has occured once every 2 hours.  Pt changes her pads 3 x night, 2 x day.  Denied leakage with coughing and sneezing. Denied CLBP.  Daily liquid intake: water not that much , "maybe a bottle of water", 32 oz of soda with ice between 6pm and prior to bed and also another 32 fl oz during the day.  Pt has not undergone a sleep study.         Pertinent History  2 vaginal deliveries.  Denied abdominal surgeries.  L TKA 03/2017.  Pt is scheduled for R TKA surgery 08/19/17 with Dr. Roland Rack.  Hx of CA, lymphadema, HBP, and other co-morbidities          [facility] PT Assessment - 07/28/17 1424      Assessment   Medical Diagnosis  mixed SUI    Referring Provider  [place]       Precautions   Precautions  None      Restrictions   Weight Bearing Restrictions  No      Balance Screen   Has the patient fallen in the past 6 months  No  [place] residence      Prior Function   Level of Independence  Independent      Coordination   Gross Motor Movements are Fluid and Coordinated  -- abdominal and chest activation with cue for pelvic floor       Sit to Stand   Comments  breathholding, poor alignment , BUE use of arms       Posture/Postural Control   Posture Comments  lumbopelvic perturbations with leg movements       Strength   Overall Strength Comments  hip flex B 4-/5, knee flex R 4-/5, L 4/5, knee ext 4/5 B       Palpation   Spinal mobility  decreased mobility in sidebend and rotation B , increased thoracic mobility        Ambulation/Gait   Gait velocity  0.6 m/s      Gait Comments  with a SPC, slowed gait, decreased R stance phase.              Objective measurements completed on examination: See above findings.      [facility] Adult PT Treatment/Exercise - 07/28/17 1424      Neuro Re-ed    Neuro Re-ed Details   see pt instructions includes sit to stand mechanics,  quick pelvic floor contractions. coordiantion of deep core coordination ,  bending over with mini squat                   PT Long Term Goals - 07/28/17 1532      PT LONG TERM GOAL #1   Title  Pt will decrease UDI-6 score from 30% to < 25% in order to improve pelvic floor function    Time  3    Status  New    Target Date  08/18/17      PT LONG TERM GOAL #2   Title  Pt will demo proper deep core coordination ( level 1, 2) 5 reps in order to progress to pelvic floor strengthening for endurance to     Time  1    Period  Weeks    Status  New    Target Date  08/04/17      PT LONG TERM GOAL #3   Title  Pt will demo Grade 3/3/3 pelvic floor strength in order to minimzie wearing of pads and to participate in ADLs and community events     Time  3    Period  Weeks    Status  New    Target Date  08/11/17      PT LONG TERM GOAL #4   Title  Pt will demo proper mechanics to minimize strain on back, knees, and pelvic floor mm and minimize worsening of Sx    Time  2    Period  Weeks    Status  New             Plan - 07/28/17 1523    Clinical Impression Statement  Pt is a 77 yo female who reports nocturia and urge incontinence. These deficits impact her QOL. Pt presents with limited spinal mobility, dyscoordination and strength of pelvic floor mm, poor body mechanics which places strain on the abdominal/pelvic floor mm. These are deficits that indicate an ineffective intraabdominal pressure system associated with increased risk for urinary leakage. Pt was explained on decreasing bladder irritants and increasing water intake. Pt voiced agreement. Pt was also explained her increased risk for  OSA and would benefit from a sleep study screening. Pt voiced no interest at this time to undergo a sleep study. Pt was explained the associated risk of OSA with nocturia and cardiac conditions.   Following training today, pt demo'd improved sit to stand mechanics with decreased strain on deep  core. Pt also demo'd proper deep core coordination. Plan to progress to endurance training at next session. Pt will be undergoing R TKA surgery on 08/19/17 and therefore, plan to d/c before that time.        History and Personal Factors relevant to plan of care:  2 vaginal deliveries.  Denied abdominal surgeries.  L TKA 03/2017.  Pt is scheduled for R TKA surgery 08/19/17 with Dr. Roland Rack.  Hx of CA, lymphadema, HBP, and other co-morbidities     Clinical Presentation  Evolving    Clinical Decision Making  High    Rehab Potential  Good    PT Frequency  1x / week    PT Duration  3 weeks    PT Treatment/Interventions  Moist Heat;Biofeedback;Therapeutic exercise;Therapeutic activities;Functional mobility training;Patient/family education;Neuromuscular re-education;Gait training;Fluidtherapy;Manual lymph drainage;Stair training    Consulted and Agree with Plan of Care  Patient       Patient will benefit from skilled therapeutic intervention in order to improve the following deficits and impairments:  Obesity, Improper body mechanics, Decreased mobility, Decreased strength, Postural dysfunction, Decreased coordination, Decreased endurance, Impaired flexibility, Decreased range of motion  Visit Diagnosis: Muscle weakness (generalized)  Other lack of coordination     Problem List Patient Active Problem List   Diagnosis Date Noted  . Venous reflux 07/02/2017  . Lymphedema 07/02/2017  . Status post total knee replacement using cement, left 04/08/2017  . Bilateral lower extremity edema 03/31/2017  . Hyperlipidemia 03/31/2017  . Essential hypertension 03/31/2017    Jerl Mina ,PT, DPT, E-RYT  07/28/2017, 3:38 PM  [place] [facility] SERVICES [address] [place], [place], [address] Phone: [phone]   Fax:  [phone]  Name: [name] MRN: [id number] Date of Birth: 03/22/1941

## 2017-08-04 ENCOUNTER — Ambulatory Visit: Payer: Medicare Other | Admitting: Physical Therapy

## 2017-08-06 ENCOUNTER — Encounter: Payer: Medicare Other | Admitting: Physical Therapy

## 2017-08-07 ENCOUNTER — Inpatient Hospital Stay: Admission: RE | Admit: 2017-08-07 | Payer: Medicare Other | Source: Ambulatory Visit

## 2017-08-08 ENCOUNTER — Ambulatory Visit
Admission: RE | Admit: 2017-08-08 | Discharge: 2017-08-08 | Disposition: A | Discharge status: Home / Self Care | Payer: Medicare Other | Source: Ambulatory Visit | Attending: Surgery | Admitting: Surgery

## 2017-08-08 ENCOUNTER — Other Ambulatory Visit: Payer: Self-pay

## 2017-08-08 ENCOUNTER — Encounter
Admission: RE | Admit: 2017-08-08 | Discharge: 2017-08-08 | Disposition: A | Discharge status: Home / Self Care | Payer: Medicare Other | Source: Ambulatory Visit | Attending: Surgery | Admitting: Surgery

## 2017-08-08 DIAGNOSIS — I498 Other specified cardiac arrhythmias: Secondary | ICD-10-CM | POA: Insufficient documentation

## 2017-08-08 DIAGNOSIS — Z0183 Encounter for blood typing: Secondary | ICD-10-CM | POA: Insufficient documentation

## 2017-08-08 DIAGNOSIS — M1711 Unilateral primary osteoarthritis, right knee: Secondary | ICD-10-CM | POA: Insufficient documentation

## 2017-08-08 DIAGNOSIS — Z01818 Encounter for other preprocedural examination: Secondary | ICD-10-CM | POA: Diagnosis present

## 2017-08-08 DIAGNOSIS — Z01812 Encounter for preprocedural laboratory examination: Secondary | ICD-10-CM | POA: Insufficient documentation

## 2017-08-08 DIAGNOSIS — R9431 Abnormal electrocardiogram [ECG] [EKG]: Secondary | ICD-10-CM | POA: Insufficient documentation

## 2017-08-08 DIAGNOSIS — Z888 Allergy status to other drugs, medicaments and biological substances status: Secondary | ICD-10-CM | POA: Diagnosis not present

## 2017-08-08 DIAGNOSIS — I700 Atherosclerosis of aorta: Secondary | ICD-10-CM | POA: Insufficient documentation

## 2017-08-08 LAB — BASIC METABOLIC PANEL
Anion gap: 7 (ref 5–15)
BUN: 17 mg/dL (ref 6–20)
CALCIUM: 9.8 mg/dL (ref 8.9–10.3)
CO2: 26 mmol/L (ref 22–32)
CREATININE: 1.02 mg/dL — AB (ref 0.44–1.00)
Chloride: 109 mmol/L (ref 101–111)
GFR calc non Af Amer: 52 mL/min — ABNORMAL LOW (ref >60)
GFR, EST AFRICAN AMERICAN: 60 mL/min — AB (ref >60)
Glucose, Bld: 81 mg/dL (ref 65–99)
Potassium: 4.1 mmol/L (ref 3.5–5.1)
SODIUM: 142 mmol/L (ref 135–145)

## 2017-08-08 LAB — URINALYSIS, ROUTINE W REFLEX MICROSCOPIC
Bilirubin Urine: NEGATIVE
Glucose, UA: NEGATIVE mg/dL
HGB URINE DIPSTICK: NEGATIVE
KETONES UR: NEGATIVE mg/dL
Leukocytes, UA: NEGATIVE
Nitrite: NEGATIVE
PROTEIN: NEGATIVE mg/dL
Specific Gravity, Urine: 1.013 (ref 1.005–1.030)
pH: 6 (ref 5.0–8.0)

## 2017-08-08 LAB — CBC
HCT: 42.7 % (ref 35.0–47.0)
Hemoglobin: 13.8 g/dL (ref 12.0–16.0)
MCH: 28.8 pg (ref 26.0–34.0)
MCHC: 32.4 g/dL (ref 32.0–36.0)
MCV: 88.9 fL (ref 80.0–100.0)
Platelets: 162 10*3/uL (ref 150–440)
RBC: 4.8 MIL/uL (ref 3.80–5.20)
RDW: 14.5 % (ref 11.5–14.5)
WBC: 4.7 10*3/uL (ref 3.6–11.0)

## 2017-08-08 LAB — SURGICAL PCR SCREEN
MRSA, PCR: NEGATIVE
STAPHYLOCOCCUS AUREUS: NEGATIVE

## 2017-08-08 LAB — PROTIME-INR
INR: 1
PROTHROMBIN TIME: 13.1 s (ref 11.4–15.2)

## 2017-08-08 NOTE — Patient Instructions (Addendum)
Your procedure is scheduled on:  Tuesday, August 19, 2017 Report to Same Day Surgery on the 2nd floor in the [facility]. To find out your arrival time, please call [phone] between 1PM - 3PM on: Monday, August 18, 2017  REMEMBER: Instructions that are not followed completely may result in serious medical risk, up to and including death; or upon the discretion of your surgeon and anesthesiologist your surgery may need to be rescheduled.  Do not eat food after midnight the night before your procedure.  No gum chewing or hard candies.  You may however, drink water up to 2 hours before you are scheduled to arrive at the hospital for your procedure.  Do not drink water within 2 hours of the start of your surgery.  No Alcohol for 24 hours before or after surgery.  No Smoking including e-cigarettes for 24 hours prior to surgery. No chewable tobacco products for at least 6 hours prior to surgery. No nicotine patches on the day of surgery.  On the morning of surgery brush your teeth with toothpaste and water, you may rinse your mouth with mouthwash if you wish. Do not swallow any  toothpaste of mouthwash.  Notify your doctor if there is any change in your medical condition (cold, fever, infection).  Do not wear jewelry, make-up, hairpins, clips or nail polish.  Do not wear lotions, powders, or perfumes. You may wear deodorant.  Do not shave 48 hours prior to surgery.   Contacts and dentures may not be worn into surgery.  Do not bring valuables to the hospital. [facility] is not responsible for any belongings or valuables.  TAKE THESE MEDICATIONS THE MORNING OF SURGERY WITH A SIP OF WATER:  1.  Omeprazole (take one capsule the night before surgery and one capsule the morning of surgery) - helps to prevent nausea 2.  Tramadol (if needed for pain)  Use CHG Soap as directed on instruction sheet.  On February 12 - Stop aspirin and Anti-inflammatories such as Advil, Aleve,  Ibuprofen, Motrin, Naproxen, Naprosyn, Goodie powder, or aspirin products. (May take Tylenol or Acetaminophen if needed.)  On February 12- Stop ANY OVER THE COUNTER supplements until after surgery. (magnesium, omega-3)  If you are being admitted to the hospital overnight, leave your suitcase in the car. After surgery it may be brought to your room.  Please call the number above if you have any questions about these instructions.

## 2017-08-09 NOTE — Pre-Procedure Instructions (Signed)
EKG sent to Anesthesia for review. 

## 2017-08-11 ENCOUNTER — Encounter: Payer: Medicare Other | Admitting: Physical Therapy

## 2017-08-11 NOTE — Pre-Procedure Instructions (Signed)
SB w/ Sinus arrhythmia noted on EKG in Care Everywhere on 09/18/2015.

## 2017-08-18 MED ORDER — CEFAZOLIN SODIUM-DEXTROSE 2-4 GM/100ML-% IV SOLN
2.0000 g | Freq: Once | INTRAVENOUS | Status: AC
  Administered 2017-08-19: 2 g via INTRAVENOUS

## 2017-08-19 ENCOUNTER — Inpatient Hospital Stay: Payer: Medicare Other

## 2017-08-19 ENCOUNTER — Ambulatory Visit: Payer: Medicare Other | Admitting: Anesthesiology

## 2017-08-19 ENCOUNTER — Encounter
Admission: RE | Disposition: A | Discharge status: Skilled Nursing Facility | Payer: Self-pay | Source: Ambulatory Visit | Attending: Surgery

## 2017-08-19 ENCOUNTER — Other Ambulatory Visit: Payer: Self-pay

## 2017-08-19 ENCOUNTER — Inpatient Hospital Stay
Admission: RE | Admit: 2017-08-19 | Discharge: 2017-08-22 | DRG: 470 | Disposition: A | Discharge status: Skilled Nursing Facility | Payer: Medicare Other | Source: Ambulatory Visit | Attending: Surgery | Admitting: Surgery

## 2017-08-19 ENCOUNTER — Encounter: Payer: Self-pay | Admitting: *Deleted

## 2017-08-19 DIAGNOSIS — E669 Obesity, unspecified: Secondary | ICD-10-CM | POA: Diagnosis present

## 2017-08-19 DIAGNOSIS — Z8249 Family history of ischemic heart disease and other diseases of the circulatory system: Secondary | ICD-10-CM | POA: Diagnosis not present

## 2017-08-19 DIAGNOSIS — Z7951 Long term (current) use of inhaled steroids: Secondary | ICD-10-CM

## 2017-08-19 DIAGNOSIS — Z96652 Presence of left artificial knee joint: Secondary | ICD-10-CM | POA: Diagnosis present

## 2017-08-19 DIAGNOSIS — I10 Essential (primary) hypertension: Secondary | ICD-10-CM | POA: Diagnosis present

## 2017-08-19 DIAGNOSIS — Z888 Allergy status to other drugs, medicaments and biological substances status: Secondary | ICD-10-CM | POA: Diagnosis not present

## 2017-08-19 DIAGNOSIS — E119 Type 2 diabetes mellitus without complications: Secondary | ICD-10-CM | POA: Diagnosis present

## 2017-08-19 DIAGNOSIS — E785 Hyperlipidemia, unspecified: Secondary | ICD-10-CM | POA: Diagnosis present

## 2017-08-19 DIAGNOSIS — Z823 Family history of stroke: Secondary | ICD-10-CM

## 2017-08-19 DIAGNOSIS — Z6834 Body mass index (BMI) 34.0-34.9, adult: Secondary | ICD-10-CM | POA: Diagnosis not present

## 2017-08-19 DIAGNOSIS — Z96651 Presence of right artificial knee joint: Secondary | ICD-10-CM

## 2017-08-19 DIAGNOSIS — M1711 Unilateral primary osteoarthritis, right knee: Secondary | ICD-10-CM | POA: Diagnosis present

## 2017-08-19 DIAGNOSIS — Z7982 Long term (current) use of aspirin: Secondary | ICD-10-CM

## 2017-08-19 DIAGNOSIS — E559 Vitamin D deficiency, unspecified: Secondary | ICD-10-CM | POA: Diagnosis present

## 2017-08-19 DIAGNOSIS — E7800 Pure hypercholesterolemia, unspecified: Secondary | ICD-10-CM | POA: Diagnosis present

## 2017-08-19 DIAGNOSIS — Z809 Family history of malignant neoplasm, unspecified: Secondary | ICD-10-CM

## 2017-08-19 DIAGNOSIS — R509 Fever, unspecified: Secondary | ICD-10-CM

## 2017-08-19 DIAGNOSIS — M25561 Pain in right knee: Secondary | ICD-10-CM | POA: Diagnosis present

## 2017-08-19 HISTORY — PX: TOTAL KNEE ARTHROPLASTY: SHX125

## 2017-08-19 LAB — GLUCOSE, CAPILLARY
GLUCOSE-CAPILLARY: 106 mg/dL — AB (ref 65–99)
Glucose-Capillary: 76 mg/dL (ref 65–99)

## 2017-08-19 SURGERY — ARTHROPLASTY, KNEE, TOTAL
Anesthesia: Spinal | Site: Knee | Laterality: Right | Wound class: Clean

## 2017-08-19 MED ORDER — TORSEMIDE 20 MG PO TABS: 20.0000 mg | ORAL_TABLET | Freq: Every day | ORAL | Status: DC | PRN

## 2017-08-19 MED ORDER — ACETAMINOPHEN 325 MG PO TABS: 650.0000 mg | ORAL_TABLET | ORAL | Status: DC | PRN

## 2017-08-19 MED ORDER — MAGNESIUM OXIDE 400 (241.3 MG) MG PO TABS
400.0000 mg | ORAL_TABLET | Freq: Every day | ORAL | Status: DC
Start: 2017-08-19 — End: 2017-08-22
  Administered 2017-08-20 – 2017-08-22 (×3): 400 mg via ORAL
  Filled 2017-08-19 (×3): qty 1

## 2017-08-19 MED ORDER — LOSARTAN POTASSIUM 25 MG PO TABS
25.0000 mg | ORAL_TABLET | Freq: Every day | ORAL | Status: DC
  Administered 2017-08-19 – 2017-08-21 (×2): 25 mg via ORAL
  Filled 2017-08-19 (×4): qty 1

## 2017-08-19 MED ORDER — BISACODYL 10 MG RE SUPP
10.0000 mg | Freq: Every day | RECTAL | Status: DC | PRN
  Administered 2017-08-22: 10 mg via RECTAL
  Filled 2017-08-19: qty 1

## 2017-08-19 MED ORDER — METOCLOPRAMIDE HCL 5 MG/ML IJ SOLN: 5.0000 mg | Freq: Three times a day (TID) | INTRAMUSCULAR | Status: DC | PRN

## 2017-08-19 MED ORDER — PROPOFOL 500 MG/50ML IV EMUL
INTRAVENOUS | Status: DC | PRN
  Administered 2017-08-19: 60 ug/kg/min via INTRAVENOUS

## 2017-08-19 MED ORDER — ENOXAPARIN SODIUM 40 MG/0.4ML ~~LOC~~ SOLN
40.0000 mg | SUBCUTANEOUS | Status: DC
  Administered 2017-08-20 – 2017-08-22 (×3): 40 mg via SUBCUTANEOUS
  Filled 2017-08-19 (×3): qty 0.4

## 2017-08-19 MED ORDER — SODIUM CHLORIDE 0.9 % IV SOLN
INTRAVENOUS | Status: DC
  Administered 2017-08-19 (×2): via INTRAVENOUS

## 2017-08-19 MED ORDER — SODIUM CHLORIDE 0.9 % IJ SOLN
INTRAMUSCULAR | Status: DC | PRN
  Administered 2017-08-19: 40 mL via INTRAVENOUS

## 2017-08-19 MED ORDER — DARIFENACIN HYDROBROMIDE ER 7.5 MG PO TB24
15.0000 mg | ORAL_TABLET | Freq: Every day | ORAL | Status: DC
  Administered 2017-08-20 – 2017-08-22 (×3): 15 mg via ORAL
  Filled 2017-08-19 (×3): qty 2
  Filled 2017-08-19: qty 1

## 2017-08-19 MED ORDER — ONDANSETRON HCL 4 MG/2ML IJ SOLN
4.0000 mg | Freq: Four times a day (QID) | INTRAMUSCULAR | Status: DC | PRN
Start: 2017-08-19 — End: 2017-08-22

## 2017-08-19 MED ORDER — DOCUSATE SODIUM 100 MG PO CAPS
100.0000 mg | ORAL_CAPSULE | Freq: Two times a day (BID) | ORAL | Status: DC
  Administered 2017-08-20 – 2017-08-21 (×4): 100 mg via ORAL
  Filled 2017-08-19 (×5): qty 1

## 2017-08-19 MED ORDER — ONDANSETRON HCL 4 MG PO TABS: 4.0000 mg | ORAL_TABLET | Freq: Four times a day (QID) | ORAL | Status: DC | PRN

## 2017-08-19 MED ORDER — BUPIVACAINE HCL (PF) 0.5 % IJ SOLN
INTRAMUSCULAR | Status: AC
  Filled 2017-08-19: qty 10

## 2017-08-19 MED ORDER — KETOROLAC TROMETHAMINE 15 MG/ML IJ SOLN
7.5000 mg | Freq: Four times a day (QID) | INTRAMUSCULAR | Status: AC
  Administered 2017-08-19 – 2017-08-20 (×4): 7.5 mg via INTRAVENOUS
  Filled 2017-08-19 (×4): qty 1

## 2017-08-19 MED ORDER — CEFAZOLIN SODIUM-DEXTROSE 2-4 GM/100ML-% IV SOLN
2.0000 g | Freq: Four times a day (QID) | INTRAVENOUS | Status: AC
  Administered 2017-08-19 – 2017-08-20 (×3): 2 g via INTRAVENOUS
  Filled 2017-08-19 (×3): qty 100

## 2017-08-19 MED ORDER — SENNOSIDES-DOCUSATE SODIUM 8.6-50 MG PO TABS
2.0000 | ORAL_TABLET | Freq: Two times a day (BID) | ORAL | Status: DC
  Administered 2017-08-19 – 2017-08-21 (×5): 2 via ORAL
  Filled 2017-08-19 (×7): qty 2

## 2017-08-19 MED ORDER — ASPIRIN EC 81 MG PO TBEC
81.0000 mg | DELAYED_RELEASE_TABLET | Freq: Every day | ORAL | Status: DC
  Administered 2017-08-20 – 2017-08-22 (×3): 81 mg via ORAL
  Filled 2017-08-19 (×3): qty 1

## 2017-08-19 MED ORDER — FENTANYL CITRATE (PF) 100 MCG/2ML IJ SOLN
INTRAMUSCULAR | Status: AC
  Filled 2017-08-19: qty 2

## 2017-08-19 MED ORDER — POTASSIUM CHLORIDE IN NACL 20-0.9 MEQ/L-% IV SOLN
INTRAVENOUS | Status: DC
  Administered 2017-08-19: 16:00:00 via INTRAVENOUS
  Filled 2017-08-19 (×4): qty 1000

## 2017-08-19 MED ORDER — GABAPENTIN 300 MG PO CAPS
300.0000 mg | ORAL_CAPSULE | Freq: Every day | ORAL | Status: DC
  Administered 2017-08-19 – 2017-08-21 (×3): 300 mg via ORAL
  Filled 2017-08-19 (×3): qty 1

## 2017-08-19 MED ORDER — NEOMYCIN-POLYMYXIN B GU 40-200000 IR SOLN
Status: DC | PRN
  Administered 2017-08-19: 14 mL

## 2017-08-19 MED ORDER — OMEGA-3 KRILL OIL 500 MG PO CAPS: 500.0000 mg | ORAL_CAPSULE | ORAL | Status: DC

## 2017-08-19 MED ORDER — PROMETHAZINE HCL 25 MG/ML IJ SOLN: 6.2500 mg | INTRAMUSCULAR | Status: DC | PRN

## 2017-08-19 MED ORDER — ACETAMINOPHEN 10 MG/ML IV SOLN
INTRAVENOUS | Status: AC
  Filled 2017-08-19: qty 100

## 2017-08-19 MED ORDER — METOPROLOL SUCCINATE ER 25 MG PO TB24
25.0000 mg | ORAL_TABLET | Freq: Every day | ORAL | Status: DC
  Administered 2017-08-19 – 2017-08-21 (×3): 25 mg via ORAL
  Filled 2017-08-19 (×3): qty 1

## 2017-08-19 MED ORDER — FENTANYL CITRATE (PF) 100 MCG/2ML IJ SOLN
INTRAMUSCULAR | Status: DC | PRN
  Administered 2017-08-19: 50 ug via INTRAVENOUS

## 2017-08-19 MED ORDER — SODIUM CHLORIDE 0.9 % IJ SOLN
INTRAMUSCULAR | Status: AC
  Filled 2017-08-19: qty 50

## 2017-08-19 MED ORDER — BUPIVACAINE LIPOSOME 1.3 % IJ SUSP
INTRAMUSCULAR | Status: AC
  Filled 2017-08-19: qty 20

## 2017-08-19 MED ORDER — OXYCODONE HCL 5 MG/5ML PO SOLN: 5.0000 mg | Freq: Once | ORAL | Status: DC | PRN

## 2017-08-19 MED ORDER — PANTOPRAZOLE SODIUM 40 MG PO TBEC
40.0000 mg | DELAYED_RELEASE_TABLET | Freq: Every day | ORAL | Status: DC
  Administered 2017-08-21 – 2017-08-22 (×2): 40 mg via ORAL
  Filled 2017-08-19 (×3): qty 1

## 2017-08-19 MED ORDER — ACETAMINOPHEN 500 MG PO TABS
1000.0000 mg | ORAL_TABLET | Freq: Four times a day (QID) | ORAL | Status: AC
  Administered 2017-08-19 – 2017-08-20 (×4): 1000 mg via ORAL
  Filled 2017-08-19 (×4): qty 2

## 2017-08-19 MED ORDER — TRANEXAMIC ACID 1000 MG/10ML IV SOLN
INTRAVENOUS | Status: DC | PRN
  Administered 2017-08-19: 1000 mg via INTRAVENOUS

## 2017-08-19 MED ORDER — TRANEXAMIC ACID 1000 MG/10ML IV SOLN
INTRAVENOUS | Status: AC
  Filled 2017-08-19: qty 10

## 2017-08-19 MED ORDER — MAGNESIUM OXIDE 400 MG PO TABS: 400.0000 mg | ORAL_TABLET | Freq: Every day | ORAL | Status: DC

## 2017-08-19 MED ORDER — SODIUM CHLORIDE 0.9 % IV SOLN
INTRAVENOUS | Status: DC | PRN
  Administered 2017-08-19: 15 ug/min via INTRAVENOUS

## 2017-08-19 MED ORDER — NEOMYCIN-POLYMYXIN B GU 40-200000 IR SOLN
Status: AC
  Filled 2017-08-19: qty 20

## 2017-08-19 MED ORDER — NYSTATIN 100000 UNIT/GM EX CREA
1.0000 "application " | TOPICAL_CREAM | Freq: Two times a day (BID) | CUTANEOUS | Status: DC | PRN
  Filled 2017-08-19: qty 15

## 2017-08-19 MED ORDER — DIPHENHYDRAMINE HCL 12.5 MG/5ML PO ELIX: 12.5000 mg | ORAL_SOLUTION | ORAL | Status: DC | PRN

## 2017-08-19 MED ORDER — POTASSIUM CHLORIDE CRYS ER 10 MEQ PO TBCR
10.0000 meq | EXTENDED_RELEASE_TABLET | Freq: Every day | ORAL | Status: DC
  Administered 2017-08-20 – 2017-08-22 (×3): 10 meq via ORAL
  Filled 2017-08-19 (×3): qty 1

## 2017-08-19 MED ORDER — OXYCODONE HCL 5 MG PO TABS
5.0000 mg | ORAL_TABLET | ORAL | Status: DC | PRN
  Filled 2017-08-19 (×2): qty 1

## 2017-08-19 MED ORDER — BUPIVACAINE-EPINEPHRINE (PF) 0.5% -1:200000 IJ SOLN
INTRAMUSCULAR | Status: AC
Start: 2017-08-19 — End: ?
  Filled 2017-08-19: qty 30

## 2017-08-19 MED ORDER — MORPHINE SULFATE (PF) 2 MG/ML IV SOLN
1.0000 mg | INTRAVENOUS | Status: DC | PRN
  Administered 2017-08-21: 2 mg via INTRAVENOUS
  Filled 2017-08-19: qty 1

## 2017-08-19 MED ORDER — MIDAZOLAM HCL 5 MG/5ML IJ SOLN
INTRAMUSCULAR | Status: DC | PRN
  Administered 2017-08-19: 1 mg via INTRAVENOUS

## 2017-08-19 MED ORDER — MEPERIDINE HCL 50 MG/ML IJ SOLN: 6.2500 mg | INTRAMUSCULAR | Status: DC | PRN

## 2017-08-19 MED ORDER — ACETAMINOPHEN 10 MG/ML IV SOLN
INTRAVENOUS | Status: DC | PRN
  Administered 2017-08-19: 1000 mg via INTRAVENOUS

## 2017-08-19 MED ORDER — OXYCODONE HCL 5 MG PO TABS
10.0000 mg | ORAL_TABLET | ORAL | Status: DC | PRN
  Administered 2017-08-19 – 2017-08-22 (×9): 10 mg via ORAL
  Filled 2017-08-19 (×10): qty 2

## 2017-08-19 MED ORDER — OXYCODONE HCL 5 MG PO TABS: 5.0000 mg | ORAL_TABLET | Freq: Once | ORAL | Status: DC | PRN

## 2017-08-19 MED ORDER — PROPOFOL 500 MG/50ML IV EMUL
INTRAVENOUS | Status: AC
Start: 2017-08-19 — End: ?
  Filled 2017-08-19: qty 50

## 2017-08-19 MED ORDER — PROPOFOL 10 MG/ML IV BOLUS
INTRAVENOUS | Status: DC | PRN
  Administered 2017-08-19: 30 mg via INTRAVENOUS

## 2017-08-19 MED ORDER — ATORVASTATIN CALCIUM 20 MG PO TABS
20.0000 mg | ORAL_TABLET | Freq: Every day | ORAL | Status: DC
  Administered 2017-08-19 – 2017-08-21 (×3): 20 mg via ORAL
  Filled 2017-08-19 (×3): qty 1

## 2017-08-19 MED ORDER — ACETAMINOPHEN 650 MG RE SUPP: 650.0000 mg | RECTAL | Status: DC | PRN

## 2017-08-19 MED ORDER — CEFAZOLIN SODIUM-DEXTROSE 2-4 GM/100ML-% IV SOLN
INTRAVENOUS | Status: AC
  Filled 2017-08-19: qty 100

## 2017-08-19 MED ORDER — MIDAZOLAM HCL 2 MG/2ML IJ SOLN
INTRAMUSCULAR | Status: AC
  Filled 2017-08-19: qty 2

## 2017-08-19 MED ORDER — SODIUM CHLORIDE 0.9 % IV SOLN
INTRAVENOUS | Status: DC | PRN
  Administered 2017-08-19: 60 mL

## 2017-08-19 MED ORDER — BUPIVACAINE-EPINEPHRINE (PF) 0.5% -1:200000 IJ SOLN
INTRAMUSCULAR | Status: DC | PRN
  Administered 2017-08-19: 30 mL via PERINEURAL

## 2017-08-19 MED ORDER — MAGNESIUM HYDROXIDE 400 MG/5ML PO SUSP
30.0000 mL | Freq: Every day | ORAL | Status: DC | PRN
  Administered 2017-08-20: 30 mL via ORAL
  Filled 2017-08-19: qty 30

## 2017-08-19 MED ORDER — FLEET ENEMA 7-19 GM/118ML RE ENEM: 1.0000 | ENEMA | Freq: Once | RECTAL | Status: DC | PRN

## 2017-08-19 MED ORDER — SODIUM CHLORIDE 0.9 % IJ SOLN
INTRAMUSCULAR | Status: AC
  Filled 2017-08-19: qty 10

## 2017-08-19 MED ORDER — METOCLOPRAMIDE HCL 10 MG PO TABS: 5.0000 mg | ORAL_TABLET | Freq: Three times a day (TID) | ORAL | Status: DC | PRN

## 2017-08-19 MED ORDER — KETOROLAC TROMETHAMINE 15 MG/ML IJ SOLN
15.0000 mg | Freq: Once | INTRAMUSCULAR | Status: AC
  Administered 2017-08-19: 15 mg via INTRAVENOUS

## 2017-08-19 MED ORDER — KETOROLAC TROMETHAMINE 15 MG/ML IJ SOLN
INTRAMUSCULAR | Status: AC
  Filled 2017-08-19: qty 1

## 2017-08-19 MED ORDER — BUPIVACAINE HCL (PF) 0.5 % IJ SOLN
INTRAMUSCULAR | Status: DC | PRN
  Administered 2017-08-19: 3 mL

## 2017-08-19 MED ORDER — FENTANYL CITRATE (PF) 100 MCG/2ML IJ SOLN: 25.0000 ug | INTRAMUSCULAR | Status: DC | PRN

## 2017-08-19 MED ORDER — PROPOFOL 500 MG/50ML IV EMUL
INTRAVENOUS | Status: AC
  Filled 2017-08-19: qty 50

## 2017-08-19 SURGICAL SUPPLY — 57 items
BANDAGE ELASTIC 6 LF NS (GAUZE/BANDAGES/DRESSINGS) ×3 IMPLANT
BLADE SAW SAG 25X90X1.19 (BLADE) ×3 IMPLANT
BLADE SURG SZ20 CARB STEEL (BLADE) ×3 IMPLANT
CANISTER SUCT 1200ML W/VALVE (MISCELLANEOUS) ×3 IMPLANT
CANISTER SUCT 3000ML PPV (MISCELLANEOUS) ×3 IMPLANT
CAPT KNEE TOTAL 3 ×3 IMPLANT
CEMENT BONE R 1X40 (Cement) ×6 IMPLANT
CEMENT VACUUM MIXING SYSTEM (MISCELLANEOUS) ×3 IMPLANT
CHLORAPREP W/TINT 26ML (MISCELLANEOUS) ×3 IMPLANT
COOLER POLAR GLACIER W/PUMP (MISCELLANEOUS) ×3 IMPLANT
COVER MAYO STAND STRL (DRAPES) ×3 IMPLANT
CUFF TOURN 24 STER (MISCELLANEOUS) IMPLANT
CUFF TOURN 30 STER DUAL PORT (MISCELLANEOUS) IMPLANT
CUFF TOURN 34 STER (MISCELLANEOUS) ×3 IMPLANT
DRAPE IMP U-DRAPE 54X76 (DRAPES) ×3 IMPLANT
DRAPE INCISE IOBAN 66X45 STRL (DRAPES) ×3 IMPLANT
DRAPE SHEET LG 3/4 BI-LAMINATE (DRAPES) ×3 IMPLANT
DRSG OPSITE POSTOP 4X10 (GAUZE/BANDAGES/DRESSINGS) ×3 IMPLANT
DRSG OPSITE POSTOP 4X12 (GAUZE/BANDAGES/DRESSINGS) IMPLANT
DRSG OPSITE POSTOP 4X14 (GAUZE/BANDAGES/DRESSINGS) IMPLANT
ELECT CAUTERY BLADE 6.4 (BLADE) ×3 IMPLANT
ELECT REM PT RETURN 9FT ADLT (ELECTROSURGICAL) ×3
ELECTRODE REM PT RTRN 9FT ADLT (ELECTROSURGICAL) ×1 IMPLANT
GLOVE BIO SURGEON STRL SZ7.5 (GLOVE) ×12 IMPLANT
GLOVE BIO SURGEON STRL SZ8 (GLOVE) ×12 IMPLANT
GLOVE BIOGEL PI IND STRL 8 (GLOVE) ×1 IMPLANT
GLOVE BIOGEL PI INDICATOR 8 (GLOVE) ×2
GLOVE INDICATOR 8.0 STRL GRN (GLOVE) ×3 IMPLANT
GOWN STRL REUS W/ TWL LRG LVL3 (GOWN DISPOSABLE) ×1 IMPLANT
GOWN STRL REUS W/ TWL XL LVL3 (GOWN DISPOSABLE) ×1 IMPLANT
GOWN STRL REUS W/TWL LRG LVL3 (GOWN DISPOSABLE) ×2
GOWN STRL REUS W/TWL XL LVL3 (GOWN DISPOSABLE) ×2
HOLDER FOLEY CATH W/STRAP (MISCELLANEOUS) ×3 IMPLANT
HOOD PEEL AWAY FLYTE STAYCOOL (MISCELLANEOUS) ×9 IMPLANT
IMMBOLIZER KNEE 19 BLUE UNIV (SOFTGOODS) ×3 IMPLANT
KIT TURNOVER KIT A (KITS) ×3 IMPLANT
NDL SAFETY ECLIPSE 18X1.5 (NEEDLE) ×2 IMPLANT
NEEDLE HYPO 18GX1.5 SHARP (NEEDLE) ×4
NEEDLE SPNL 20GX3.5 QUINCKE YW (NEEDLE) ×3 IMPLANT
NS IRRIG 1000ML POUR BTL (IV SOLUTION) ×3 IMPLANT
PACK TOTAL KNEE (MISCELLANEOUS) ×3 IMPLANT
PAD WRAPON POLAR KNEE (MISCELLANEOUS) ×1 IMPLANT
PULSAVAC PLUS IRRIG FAN TIP (DISPOSABLE) ×3
SOL .9 NS 3000ML IRR  AL (IV SOLUTION) ×2
SOL .9 NS 3000ML IRR UROMATIC (IV SOLUTION) ×1 IMPLANT
STAPLER SKIN PROX 35W (STAPLE) ×3 IMPLANT
SUCTION FRAZIER HANDLE 10FR (MISCELLANEOUS) ×2
SUCTION TUBE FRAZIER 10FR DISP (MISCELLANEOUS) ×1 IMPLANT
SUT VIC AB 0 CT1 36 (SUTURE) ×9 IMPLANT
SUT VIC AB 2-0 CT1 27 (SUTURE) ×6
SUT VIC AB 2-0 CT1 TAPERPNT 27 (SUTURE) ×3 IMPLANT
SYR 10ML LL (SYRINGE) ×3 IMPLANT
SYR 20CC LL (SYRINGE) ×3 IMPLANT
SYR 30ML LL (SYRINGE) ×9 IMPLANT
TIP FAN IRRIG PULSAVAC PLUS (DISPOSABLE) ×1 IMPLANT
TRAY FOLEY W/METER SILVER 16FR (SET/KITS/TRAYS/PACK) ×3 IMPLANT
WRAPON POLAR PAD KNEE (MISCELLANEOUS) ×3

## 2017-08-19 NOTE — Anesthesia Procedure Notes (Signed)
Spinal  Patient location during procedure: OR Start time: 08/19/2017 7:35 AM End time: 08/19/2017 7:50 AM Staffing Anesthesiologist: Emmie Niemann, MD Resident/CRNA: Aline Brochure, CRNA Performed: resident/CRNA and anesthesiologist  Preanesthetic Checklist Completed: patient identified, site marked, surgical consent, pre-op evaluation, timeout performed, IV checked, risks and benefits discussed and monitors and equipment checked Spinal Block Patient position: sitting Prep: ChloraPrep Patient monitoring: heart rate, continuous pulse ox, blood pressure and cardiac monitor Approach: midline Location: L4-5 Injection technique: single-shot Needle Needle type: Introducer and Pencil-Tip  Needle gauge: 24 G Needle length: 9 cm Additional Notes Negative paresthesia. Negative blood return. Positive free-flowing CSF. Expiration date of kit checked and confirmed. Patient tolerated procedure well, without complications.

## 2017-08-19 NOTE — Transfer of Care (Signed)
Immediate Anesthesia Transfer of Care Note  Patient: [name]  Procedure(s) Performed: TOTAL KNEE ARTHROPLASTY (Right Knee)  Patient Location: PACU  Anesthesia Type:Spinal  Level of Consciousness: awake and alert   Airway & Oxygen Therapy: Patient connected to nasal cannula oxygen  Post-op Assessment: Post -op Vital signs reviewed and stable  Post vital signs: stable  Last Vitals:  Vitals:   08/19/17 0614 08/19/17 1024  BP: (!) 143/83 (!) 116/59  Pulse: (!) 59 75  Resp: 16 13  Temp: (!) 36.2 C 36.8 C  SpO2: 100% 99%    Last Pain:  Vitals:   08/19/17 1024  TempSrc:   PainSc: Asleep         Complications: No apparent anesthesia complications

## 2017-08-19 NOTE — Anesthesia Post-op Follow-up Note (Signed)
Anesthesia QCDR form completed.        

## 2017-08-19 NOTE — NC FL2 (Signed)
[place] LEVEL OF CARE SCREENING TOOL     IDENTIFICATION  Patient Name: [name] Birthdate: 14-Jul-1940 Sex: female Admission Date (Current Location): 08/19/2017  [place] and [place] Number:  Engineering geologist and Address:  [facility], [address] [place], [place] [address]      Provider Number: [id number]  Attending Physician Name and Address:  Corky Mull, MD  Relative Name and Phone Number:       Current Level of Care: Hospital Recommended Level of Care: [place] Prior Approval Number:    Date Approved/Denied:   PASRR Number: [id number])  Discharge Plan: SNF    Current Diagnoses: Patient Active Problem List   Diagnosis Date Noted  . Status post total knee replacement using cement, right 08/19/2017  . Venous reflux 07/02/2017  . Lymphedema 07/02/2017  . Status post total knee replacement using cement, left 04/08/2017  . Bilateral lower extremity edema 03/31/2017  . Hyperlipidemia 03/31/2017  . Essential hypertension 03/31/2017    Orientation RESPIRATION BLADDER Height & Weight     Self, Time, Situation, Place  Normal Continent Weight:   Height:     BEHAVIORAL SYMPTOMS/MOOD NEUROLOGICAL BOWEL NUTRITION STATUS      Continent Diet(Clear Liquid to be advanced)  AMBULATORY STATUS COMMUNICATION OF NEEDS Skin   Extensive Assist Verbally Surgical wounds(Incision Right Knee)                       Personal Care Assistance Level of Assistance  Bathing, Feeding, Dressing Bathing Assistance: Limited assistance Feeding assistance: Independent Dressing Assistance: Limited assistance     Functional Limitations Info  Sight, Hearing, Speech Sight Info: Adequate Hearing Info: Adequate Speech Info: Adequate    SPECIAL CARE FACTORS FREQUENCY  PT (By licensed PT), OT (By licensed OT)     PT Frequency: (5) OT Frequency: (5)            Contractures      Additional Factors Info   Code Status, Allergies Code Status Info: (Full Code) Allergies Info: (COLESEVELAM, OXYBUTYNIN, ROSUVASTATIN )           Current Medications (08/19/2017):  This is the current hospital active medication list Current Facility-Administered Medications  Medication Dose Route Frequency Provider Last Rate Last Dose  . 0.9 % NaCl with KCl 20 mEq/ L  infusion   Intravenous Continuous Poggi, Marshall Cork, MD      . acetaminophen (TYLENOL) tablet 650 mg  650 mg Oral Q4H PRN Poggi, Marshall Cork, MD       Or  . acetaminophen (TYLENOL) suppository 650 mg  650 mg Rectal Q4H PRN Poggi, Marshall Cork, MD      . acetaminophen (TYLENOL) tablet 1,000 mg  1,000 mg Oral Q6H Poggi, Marshall Cork, MD      . aspirin EC tablet 81 mg  81 mg Oral Daily Poggi, Marshall Cork, MD      . atorvastatin (LIPITOR) tablet 20 mg  20 mg Oral QHS Poggi, Marshall Cork, MD      . bisacodyl (DULCOLAX) suppository 10 mg  10 mg Rectal Daily PRN Poggi, Marshall Cork, MD      . ceFAZolin (ANCEF) IVPB 2g/100 mL premix  2 g Intravenous Q6H Poggi, Marshall Cork, MD      . darifenacin (ENABLEX) 24 hr tablet 15 mg  15 mg Oral Daily Poggi, Marshall Cork, MD      . diphenhydrAMINE (BENADRYL) 12.5 MG/5ML elixir 12.5-25 mg  12.5-25 mg  Oral Q4H PRN Poggi, Marshall Cork, MD      . docusate sodium (COLACE) capsule 100 mg  100 mg Oral BID Poggi, Marshall Cork, MD      . Derrill Memo ON 08/20/2017] enoxaparin (LOVENOX) injection 40 mg  40 mg Subcutaneous Q24H Poggi, Marshall Cork, MD      . gabapentin (NEURONTIN) capsule 300 mg  300 mg Oral QHS Poggi, Marshall Cork, MD      . ketorolac (TORADOL) 15 MG/ML injection 7.5 mg  7.5 mg Intravenous Q6H Poggi, Marshall Cork, MD      . losartan (COZAAR) tablet 25 mg  25 mg Oral Daily Poggi, Marshall Cork, MD      . magnesium hydroxide (MILK OF MAGNESIA) suspension 30 mL  30 mL Oral Daily PRN Poggi, Marshall Cork, MD      . magnesium oxide (MAG-OX) tablet 400 mg  400 mg Oral Daily Poggi, Marshall Cork, MD      . metoCLOPramide (REGLAN) tablet 5-10 mg  5-10 mg Oral Q8H PRN Poggi, Marshall Cork, MD       Or  . metoCLOPramide (REGLAN)  injection 5-10 mg  5-10 mg Intravenous Q8H PRN Poggi, Marshall Cork, MD      . metoprolol succinate (TOPROL-XL) 24 hr tablet 25 mg  25 mg Oral QHS Poggi, Marshall Cork, MD      . morphine 2 MG/ML injection 1-2 mg  1-2 mg Intravenous Q2H PRN Poggi, Marshall Cork, MD      . nystatin cream (MYCOSTATIN) 1 application  1 application Topical BID PRN Poggi, Marshall Cork, MD      . Derrill Memo ON 08/20/2017] Omega-3 Krill Oil CAPS 500 mg  500 mg Oral Once per day on Mon Wed Fri Poggi, John J, MD      . ondansetron [facility]) tablet 4 mg  4 mg Oral Q6H PRN Poggi, Marshall Cork, MD       Or  . ondansetron (ZOFRAN) injection 4 mg  4 mg Intravenous Q6H PRN Poggi, Marshall Cork, MD      . oxyCODONE (Oxy IR/ROXICODONE) immediate release tablet 10 mg  10 mg Oral Q3H PRN Poggi, Marshall Cork, MD      . oxyCODONE (Oxy IR/ROXICODONE) immediate release tablet 5 mg  5 mg Oral Q3H PRN Poggi, Marshall Cork, MD      . Derrill Memo ON 08/20/2017] pantoprazole (PROTONIX) EC tablet 40 mg  40 mg Oral Daily Poggi, Marshall Cork, MD      . Derrill Memo ON 08/20/2017] potassium chloride (K-DUR,KLOR-CON) CR tablet 10 mEq  10 mEq Oral Daily Poggi, Marshall Cork, MD      . senna-docusate (Senokot-S) tablet 2 tablet  2 tablet Oral BID Poggi, Marshall Cork, MD      . sodium phosphate (FLEET) 7-19 GM/118ML enema 1 enema  1 enema Rectal Once PRN Poggi, Marshall Cork, MD      . torsemide (DEMADEX) tablet 20 mg  20 mg Oral Daily PRN Poggi, Marshall Cork, MD         Discharge Medications: Please see discharge summary for a list of discharge medications.  Relevant Imaging Results:  Relevant Lab Results:   Additional Information (SSN: [id number])  Smith Mince, Student-Social Work

## 2017-08-19 NOTE — H&P (Signed)
Paper H&P to be scanned into permanent record. H&P reviewed and patient re-examined. No changes. 

## 2017-08-19 NOTE — Anesthesia Preprocedure Evaluation (Signed)
Anesthesia Evaluation  Patient identified by MRN, date of birth, ID band Patient awake    Reviewed: Allergy & Precautions, NPO status , Patient's Chart, lab work & pertinent test results  History of Anesthesia Complications Negative for: history of anesthetic complications  Airway Mallampati: II  TM Distance: >3 FB Neck ROM: Full    Dental  (+) Partial Upper   Pulmonary neg pulmonary ROS, neg sleep apnea, neg COPD,    breath sounds clear to auscultation- rhonchi (-) wheezing      Cardiovascular hypertension, Pt. on medications (-) CAD, (-) Past MI, (-) Cardiac Stents and (-) CABG  Rhythm:Regular Rate:Normal - Systolic murmurs and - Diastolic murmurs    Neuro/Psych negative neurological ROS  negative psych ROS   GI/Hepatic negative GI ROS, Neg liver ROS,   Endo/Other  diabetes (diet controlled)  Renal/GU negative Renal ROS     Musculoskeletal  (+) Arthritis ,   Abdominal (+) + obese,   Peds  Hematology negative hematology ROS (+)   Anesthesia Other Findings Past Medical History: No date: Acquired hammertoe     Comment:  other No date: Angina pectoris, unspecified (HCC) No date: Arthritis     Comment:  knees 1990,s: Cancer (HCC)     Comment:  left breast No date: Degenerative joint disease No date: Dermatophytosis of nail No date: Diabetes mellitus type 2, uncomplicated (HCC) No date: Essential hypertension, benign No date: Exostosis     Comment:  unspecified site 2010: Fracture of right ankle     Comment:  Followed by Dr. Vickki Muff No date: Hypercholesterolemia No date: Hyperlipidemia, unspecified No date: Hypertension No date: Lymphedema of leg     Comment:  left leg worse than right leg. No date: Obesity, unspecified No date: Osteoarthrosis involving lower leg     Comment:  unspecified whether generalized or localized  No date: Pre-diabetes No date: Pure hypercholesterolemia No date: Vitamin D  deficiency, unspecified   Reproductive/Obstetrics                             Lab Results  Component Value Date   WBC 4.7 08/08/2017   HGB 13.8 08/08/2017   HCT 42.7 08/08/2017   MCV 88.9 08/08/2017   PLT 162 08/08/2017    Anesthesia Physical Anesthesia Plan  ASA: II  Anesthesia Plan: Spinal   Post-op Pain Management:    Induction:   PONV Risk Score and Plan: 2 and Propofol infusion  Airway Management Planned: Natural Airway  Additional Equipment:   Intra-op Plan:   Post-operative Plan:   Informed Consent: I have reviewed the patients History and Physical, chart, labs and discussed the procedure including the risks, benefits and alternatives for the proposed anesthesia with the patient or authorized representative who has indicated his/her understanding and acceptance.   Dental advisory given  Plan Discussed with: CRNA and Anesthesiologist  Anesthesia Plan Comments:         Anesthesia Quick Evaluation

## 2017-08-19 NOTE — Progress Notes (Signed)
Pt notified that pharmacy does not carry Omega-3 Krill Oil. Pt notified to provide from home.

## 2017-08-19 NOTE — Progress Notes (Signed)
ADMISSION NOTE:  Pt admitted to room 147 from PACU. Alert and oriented X4. No complaints. Surgical dressing clean dry and intact. CPM placed on in PACU. TED hose, foot pump and polar care. Son at bedside. Bed in lowest position call bell in reach and bed alarm on.

## 2017-08-19 NOTE — Evaluation (Signed)
Physical Therapy Evaluation Patient Details Name: [name] MRN: [id number] DOB: 10-02-1940 Today's Date: 08/19/2017   History of Present Illness  77 y/o female s/p R TKA 08/19/17.  Clinical Impression  Pt did well with POD0 PT exam showing good quad control (did AROM SLRs after warming up) and was able to tolerate WBing and take a few steps to the recliner using walker.  She is highly motivated to work with PT and showed great effort.  Pt needed some assist with bed mobility and getting to standing but ultimately did well. She performed ~15 minutes of LE exercises apart from PT exam with good ROM and strength for post-op day 0.     Follow Up Recommendations SNF(per progress)    Equipment Recommendations       Recommendations for Other Services       Precautions / Restrictions Precautions Precautions: Knee;Fall Restrictions RLE Weight Bearing: Weight bearing as tolerated      Mobility  Bed Mobility Overal bed mobility: Needs Assistance Bed Mobility: Supine to Sit     Supine to sit: Min assist     General bed mobility comments: Pt was able to get to EOB w/ very little assist with LEs/scooting  Transfers Overall transfer level: Needs assistance Equipment used: Rolling walker (2 wheeled) Transfers: Sit to/from Stand Sit to Stand: Min assist         General transfer comment: Pt unable to rise to standing from low bed setting despite mutliple attempts, did rise with very light assist with bad raised ~3"  Ambulation/Gait Ambulation/Gait assistance: Min guard Ambulation Distance (Feet): 6 Feet Assistive device: Rolling walker (2 wheeled)       General Gait Details: Pt was initially hesitant with WBing through the R LE, but ultimately did well with heavy reliance on the walker for support  Stairs            Wheelchair Mobility    Modified Rankin (Stroke Patients Only)       Balance Overall balance assessment: Modified Independent                                            Pertinent Vitals/Pain Pain Assessment: 0-10 Pain Score: 5  Pain Location:  R knee, pain increased with activity    Home Living Family/patient expects to be discharged to:: Skilled nursing facility Living Arrangements: Alone                    Prior Function Level of Independence: Independent with assistive device(s)         Comments: Pt amb in home with no assist device, amb in community with SPC. Pt reports being independent with all ADLs, able to drive     Hand Dominance        Extremity/Trunk Assessment   Upper Extremity Assessment Upper Extremity Assessment: Overall WFL for tasks assessed    Lower Extremity Assessment Lower Extremity Assessment: (expected post-op weakness in R LE, WFL otherwise)       Communication   Communication: No difficulties  Cognition Arousal/Alertness: Awake/alert Behavior During Therapy: WFL for tasks assessed/performed Overall Cognitive Status: Within Functional Limits for tasks assessed  General Comments      Exercises Total Joint Exercises Ankle Circles/Pumps: AROM;10 reps Quad Sets: Strengthening;10 reps Gluteal Sets: Strengthening;10 reps Short Arc Quad: AROM;Strengthening;10 reps Heel Slides: AAROM;AROM;5 reps Hip ABduction/ADduction: 10 reps;AROM;Strengthening Straight Leg Raises: AROM;AAROM;10 reps Knee Flexion: PROM;5 reps Goniometric ROM: 0-92   Assessment/Plan    PT Assessment Patient needs continued PT services  PT Problem List Decreased balance;Decreased knowledge of use of DME;Decreased strength;Decreased range of motion;Decreased activity tolerance;Decreased mobility;Decreased coordination;Decreased safety awareness;Pain       PT Treatment Interventions DME instruction;Gait training;Stair training;Functional mobility training;Therapeutic activities;Balance training;Therapeutic exercise;Patient/family  education;Neuromuscular re-education    PT Goals (Current goals can be found in the Care Plan section)  Acute Rehab PT Goals Patient Stated Goal: go to rehab to get stronger PT Goal Formulation: With patient Time For Goal Achievement: 09/02/17 Potential to Achieve Goals: Good    Frequency BID   Barriers to discharge Decreased caregiver support      Co-evaluation               AM-PAC PT "6 Clicks" Daily Activity  Outcome Measure Difficulty turning over in bed (including adjusting bedclothes, sheets and blankets)?: A Little Difficulty moving from lying on back to sitting on the side of the bed? : Unable Difficulty sitting down on and standing up from a chair with arms (e.g., wheelchair, bedside commode, etc,.)?: Unable Help needed moving to and from a bed to chair (including a wheelchair)?: A Little Help needed walking in hospital room?: A Lot Help needed climbing 3-5 steps with a railing? : A Lot 6 Click Score: 12    End of Session Equipment Utilized During Treatment: Gait belt Activity Tolerance: Patient tolerated treatment well Patient left: with call bell/phone within reach;with chair alarm set Nurse Communication: Mobility status PT Visit Diagnosis: Muscle weakness (generalized) (M62.81);Difficulty in walking, not elsewhere classified (R26.2)    Time: 1555-1630 PT Time Calculation (min) (ACUTE ONLY): 35 min   Charges:   PT Evaluation $PT Eval Low Complexity: 1 Low PT Treatments $Therapeutic Exercise: 8-22 mins   PT G Codes:        Kreg Shropshire, DPT 08/19/2017, 5:35 PM

## 2017-08-19 NOTE — Op Note (Signed)
08/19/2017  10:27 AM  Patient:   [name]  Pre-Op Diagnosis:   Degenerative joint disease, right knee.  Post-Op Diagnosis:   Same  Procedure:   Right TKA using all-cemented [employer] Vanguard system with a 72.5 mm PCR femur, a 79 mm tibial tray with a 14 mm E-poly AS insert, and a 37 x 10 mm all-poly 3-pegged domed patella.  Surgeon:   Pascal Lux, MD  Assistant:   Cameron Proud, PA-C   Anesthesia:   Spinal  Findings:   As above  Complications:   None  EBL:   25 cc  Fluids:   1000 cc crystalloid  UOP:   900 cc  TT:   110 minutes at 300 mmHg  Drains:   None  Closure:   Staples  Implants:   As above  Brief Clinical Note:   The patient is a 77 year old female with a long history of progressively worsening right knee pain. The patient's symptoms have progressed despite medications, activity modification, injections, etc. The patient's history and examination were consistent with advanced degenerative joint disease of the right knee confirmed by plain radiographs. The patient presents at this time for a right total knee arthroplasty.  Procedure:   The patient was brought into the operating room. After adequate spinal anesthesia was obtained, the patient was lain in the supine position. A Foley catheter was placed by the nurse before the right lower extremity was prepped with ChloraPrep solution and draped sterilely. Preoperative antibiotics were administered. After verifying the proper laterality with a surgical timeout, the limb was exsanguinated with an Esmarch and the tourniquet inflated to 300 mmHg. A standard anterior approach to the knee was made through an approximately 7 inch incision. The incision was carried down through the subcutaneous tissues to expose superficial retinaculum. This was split the length of the incision and the medial flap elevated sufficiently to expose the medial retinaculum. The medial retinaculum was incised, leaving a 3-4 mm cuff of tissue on  the patella. This was extended distally along the medial border of the patellar tendon and proximally through the medial third of the quadriceps tendon. A subtotal fat pad excision was performed before the soft tissues were elevated off the anteromedial and anterolateral aspects of the proximal tibia to the level of the collateral ligaments. The anterior portions of the medial and lateral menisci were removed, as was the anterior cruciate ligament. With the knee flexed to 90, the external tibial guide was positioned and the appropriate proximal tibial cut made. This piece was taken to the back table where it was measured and found to be optimally replicated by a 79 mm component.  Attention was directed to the distal femur. The intramedullary canal was accessed through a 3/8" drill hole. The intramedullary guide was inserted and position in order to obtain a neutral flexion gap. The intercondylar block was positioned with care taken to avoid notching the anterior cortex of the femur. The appropriate cut was made. Next, the distal cutting block was placed at 6 of valgus alignment. Using the 9 mm slot, the distal cut was made. The distal femur was measured and found to be optimally replicated by the 51.0 mm component. The 72.5 mm 4-in-1 cutting block was positioned and first the posterior, then the posterior chamfer, the anterior chamfer, femoral and intercondylar cuts were made. At this point, the posterior portions medial and lateral menisci were removed. A trial reduction was performed using the appropriate femoral and tibial components with first the  10 mm, then the 12 mm, and finally the 14 mm insert. The 14 mm insert demonstrated excellent stability to varus and valgus stressing both in flexion and extension while permitting full extension. Patella tracking was assessed and found to be excellent. Therefore, the tibial guide position was marked on the proximal tibia. The patella thickness was measured and  found to be 25 mm. Therefore, the appropriate cut was made. The patellar surface was measured and found to be optimally replicated by the 37 mm component. The three peg holes were drilled in place before the trial button was inserted. Patella tracking was assessed and found to be excellent, passing the "no thumb test". The lug holes were drilled into the distal femur before the trial component was removed, leaving only the tibial tray. The keel was then created using the appropriate tower, reamer, and punch.  The bony surfaces were prepared for cementing by irrigating thoroughly with bacitracin saline solution. A bone plug was fashioned from some of the bone that had been removed previously and used to plug the distal femoral canal. In addition, 20 cc of Exparel diluted out to 60 cc with normal saline and 30 cc of 0.5% Sensorcaine were injected into the postero-medial and postero-lateral aspects of the knee, the medial and lateral gutter regions, and the peri-incisional tissues to help with postoperative analgesia. Meanwhile, the cement was being mixed on the back table. When it was ready, the tibial tray was cemented in first. The excess cement was removed using [employer] fast streamer. Next, the femoral component was impacted into place. Again, the excess cement was removed using [employer] fast streamer. The 14 mm trial insert was positioned and the knee brought into extension while the cement hardened. Finally, the patella was cemented into place and secured using the patellar clamp. Again, the excess cement was removed using [employer] fast streamer. Once the cement had hardened, the knee was placed through a range of motion with the findings as described above. Therefore, the trial insert was removed and, after verifying that no cement had been retained posteriorly, the permanent insert was positioned and secured using the appropriate key locking mechanism. Again the knee was placed through a range of motion with the findings as  described above.  The wound was copiously irrigated with bacitracin saline solution using the jet lavage system before the quadriceps tendon and retinacular layer were reapproximated using #0 Vicryl interrupted sutures. The superficial retinacular layer also was closed using a running #0 Vicryl suture. A total of 10 cc of transexemic acid (TXA) was injected intra-articularly before the subcutaneous tissues were closed in several layers using 2-0 Vicryl interrupted sutures. The skin was closed using staples. A sterile honeycomb dressing was applied to the skin before the leg was wrapped with an Ace wrap to accommodate the polar pack. The patient was then awakened and returned to the recovery room in satisfactory condition after tolerating the procedure well.

## 2017-08-20 ENCOUNTER — Encounter
Admission: RE | Admit: 2017-08-20 | Discharge: 2017-08-20 | Disposition: A | Discharge status: Home / Self Care | Payer: Medicare Other | Source: Ambulatory Visit | Attending: Internal Medicine | Admitting: Internal Medicine

## 2017-08-20 LAB — BASIC METABOLIC PANEL
Anion gap: 5 (ref 5–15)
BUN: 15 mg/dL (ref 6–20)
CALCIUM: 8.7 mg/dL — AB (ref 8.9–10.3)
CO2: 24 mmol/L (ref 22–32)
Chloride: 110 mmol/L (ref 101–111)
Creatinine, Ser: 0.89 mg/dL (ref 0.44–1.00)
GFR calc Af Amer: >60 mL/min (ref >60)
Glucose, Bld: 119 mg/dL — ABNORMAL HIGH (ref 65–99)
POTASSIUM: 4.1 mmol/L (ref 3.5–5.1)
SODIUM: 139 mmol/L (ref 135–145)

## 2017-08-20 LAB — CBC WITH DIFFERENTIAL/PLATELET
BASOS ABS: 0 10*3/uL (ref 0–0.1)
Basophils Relative: 0 %
EOS ABS: 0.1 10*3/uL (ref 0–0.7)
EOS PCT: 2 %
HCT: 37 % (ref 35.0–47.0)
Hemoglobin: 12.4 g/dL (ref 12.0–16.0)
Lymphocytes Relative: 10 %
Lymphs Abs: 0.5 10*3/uL — ABNORMAL LOW (ref 1.0–3.6)
MCH: 29.5 pg (ref 26.0–34.0)
MCHC: 33.5 g/dL (ref 32.0–36.0)
MCV: 88.1 fL (ref 80.0–100.0)
MONO ABS: 0.7 10*3/uL (ref 0.2–0.9)
Monocytes Relative: 12 %
Neutro Abs: 4.4 10*3/uL (ref 1.4–6.5)
Neutrophils Relative %: 76 %
PLATELETS: 128 10*3/uL — AB (ref 150–440)
RBC: 4.2 MIL/uL (ref 3.80–5.20)
RDW: 14.8 % — AB (ref 11.5–14.5)
WBC: 5.7 10*3/uL (ref 3.6–11.0)

## 2017-08-20 MED ORDER — TRAMADOL HCL 50 MG PO TABS
50.0000 mg | ORAL_TABLET | Freq: Four times a day (QID) | ORAL | Status: DC | PRN
Start: 2017-08-20 — End: 2017-08-22

## 2017-08-20 NOTE — Care Management (Signed)
RNCM met with patient and her son. She is from home alone. She has a walker available for use. She uses [facility] [address] for medications.  She hopes to go to [facility] at discharge.

## 2017-08-20 NOTE — Progress Notes (Signed)
  Subjective: 1 Day Post-Op Procedure(s) (LRB): TOTAL KNEE ARTHROPLASTY (Right) Patient reports pain as mild.   Patient is well, and has had no acute complaints or problems Plan is to go Skilled nursing facility after hospital stay. Negative for chest pain and shortness of breath Fever: no Gastrointestinal:Negative for nausea and vomiting, but reports the sensation of bloating following surgery yesterday.  Objective: Vital signs in last 24 hours: Temp:  [97.3 F (36.3 C)-98.6 F (37 C)] 98.6 F (37 C) (02/20 0404) Pulse Rate:  [45-75] 63 (02/20 0404) Resp:  [13-30] 17 (02/20 0404) BP: (116-183)/(54-82) 119/54 (02/20 0404) SpO2:  [97 %-100 %] 97 % (02/20 0404) Weight:  [105.7 kg (233 lb)] 105.7 kg (233 lb) (02/19 1440)  Intake/Output from previous day:  Intake/Output Summary (Last 24 hours) at 08/20/2017 0751 Last data filed at 08/20/2017 0440 Gross per 24 hour  Intake 2536.25 ml  Output 2300 ml  Net 236.25 ml    Intake/Output this shift: No intake/output data recorded.  Labs: Recent Labs    08/20/17 0349  HGB 12.4   Recent Labs    08/20/17 0349  WBC 5.7  RBC 4.20  HCT 37.0  PLT 128*   Recent Labs    08/20/17 0349  NA 139  K 4.1  CL 110  CO2 24  BUN 15  CREATININE 0.89  GLUCOSE 119*  CALCIUM 8.7*   No results for input(s): LABPT, INR in the last 72 hours.   EXAM General - Patient is Alert, Appropriate and Oriented Extremity - Neurovascular intact Sensation intact distally Intact pulses distally Dorsiflexion/Plantar flexion intact Incision: scant drainage No cellulitis present Dressing/Incision - blood tinged drainage. Motor Function - intact, moving foot and toes well on exam.   Abdomen soft this morning, mild tympany.  BS normal.  Past Medical History:  Diagnosis Date  . Acquired hammertoe    other  . Angina pectoris, unspecified ([place])   . Arthritis    knees  . Cancer ([place]) 1990,s   left breast  . Degenerative joint disease   .  Dermatophytosis of nail   . Diabetes mellitus type 2, uncomplicated ([place])   . Essential hypertension, benign   . Exostosis    unspecified site  . Fracture of right ankle 2010   Followed by Dr. Vickki Muff  . Hypercholesterolemia   . Hyperlipidemia, unspecified   . Hypertension   . Lymphedema of leg    left leg worse than right leg.  . Obesity, unspecified   . Osteoarthrosis involving lower leg    unspecified whether generalized or localized   . Pre-diabetes   . Pure hypercholesterolemia   . Vitamin D deficiency, unspecified     Assessment/Plan: 1 Day Post-Op Procedure(s) (LRB): TOTAL KNEE ARTHROPLASTY (Right) Active Problems:   Status post total knee replacement using cement, right  Estimated body mass index is 34.41 kg/m as calculated from the following:   Height as of this encounter: 5\' 9"  (1.753 m).   Weight as of this encounter: 105.7 kg (233 lb). Advance diet Up with therapy D/C IV fluids when tolerating po intake.  Labs reviewed this AM. Hg 12.4 Up with therapy today, current plan is for discharge to SNF on Friday. Begin working on having a BM.  DVT Prophylaxis - Lovenox, Foot Pumps and TED hose Weight-Bearing as tolerated to right leg  J. Cameron Proud, PA-C [facility] Orthopaedic Surgery 08/20/2017, 7:51 AM

## 2017-08-20 NOTE — Progress Notes (Signed)
Physical Therapy Treatment Patient Details Name: ADITHI GAMMON MRN: [id number] DOB: 1940/10/20 Today's Date: 08/20/2017    History of Present Illness 77 y/o female s/p R TKA 08/19/17.    PT Comments    Pt continues to make good progress with therapy. She is able to increase her ambulation distance with therapist this afternoon. She continues to move slowly but is more steady this afternoon with bilateral UE support on rolling walker. Cues to decrease UE reliance on rolling walker. Pt denies DOE and VSS following ambulation. She is able to perform all exercises as instructed by therapist. Pt will benefit from PT services to address deficits in strength, balance, and mobility in order to return to full function at home.     Follow Up Recommendations  SNF(per progress)     Equipment Recommendations  Other (comment)(TBD at next venue)    Recommendations for Other Services       Precautions / Restrictions Precautions Precautions: Knee;Fall Restrictions RLE Weight Bearing: Weight bearing as tolerated    Mobility  Bed Mobility Overal bed mobility: Modified Independent Bed Mobility: Sit to Supine       Sit to supine: Modified independent (Device/Increase time)   General bed mobility comments: Increased time required, head of bed elevated and bed rails utilized. No external assist required from therapist but pt provided cues to utilized LLE to assist RLE when returning to bed  Transfers Overall transfer level: Needs assistance Equipment used: Rolling walker (2 wheeled) Transfers: Sit to/from Stand Sit to Stand: Min assist         General transfer comment: Pt requires minA+1 to get up from low commode in bathroom. Cues for safe hand placement when returning to bed. Decreased eccentric control  Ambulation/Gait Ambulation/Gait assistance: Min guard Ambulation Distance (Feet): 80 Feet Assistive device: Rolling walker (2 wheeled)   Gait velocity: Decreased   General Gait  Details: Pt is able to increase her ambulation distance with therapist this afternoon. She continues to move slowly but is more steady this afternoon with bilateral UE support on rolling walker. Pt denies DOE and VSS following ambulation   Stairs            Wheelchair Mobility    Modified Rankin (Stroke Patients Only)       Balance Overall balance assessment: Modified Independent                                          Cognition Arousal/Alertness: Awake/alert Behavior During Therapy: WFL for tasks assessed/performed Overall Cognitive Status: Within Functional Limits for tasks assessed                                        Exercises Total Joint Exercises Gluteal Sets: Both;10 reps Towel Squeeze: Both;10 reps Short Arc Quad: Right;10 reps Heel Slides: Right;10 reps Hip ABduction/ADduction: Right;10 reps Straight Leg Raises: Right;10 reps    General Comments        Pertinent Vitals/Pain Pain Assessment: 0-10 Pain Score: 1  Pain Location:  R knee, pain increased with activity Pain Descriptors / Indicators: Operative site guarding Pain Intervention(s): Monitored during session    Home Living                      Prior Function  PT Goals (current goals can now be found in the care plan section) Acute Rehab PT Goals Patient Stated Goal: go to rehab to get stronger PT Goal Formulation: With patient Time For Goal Achievement: 09/02/17 Potential to Achieve Goals: Good Progress towards PT goals: Progressing toward goals    Frequency    BID      PT Plan Current plan remains appropriate    Co-evaluation              AM-PAC PT "6 Clicks" Daily Activity  Outcome Measure  Difficulty turning over in bed (including adjusting bedclothes, sheets and blankets)?: A Little Difficulty moving from lying on back to sitting on the side of the bed? : A Lot Difficulty sitting down on and standing up from a  chair with arms (e.g., wheelchair, bedside commode, etc,.)?: Unable Help needed moving to and from a bed to chair (including a wheelchair)?: A Little Help needed walking in hospital room?: A Little Help needed climbing 3-5 steps with a railing? : A Lot 6 Click Score: 14    End of Session Equipment Utilized During Treatment: Gait belt Activity Tolerance: Patient tolerated treatment well Patient left: with call bell/phone within reach;with chair alarm set;with nursing/sitter in room;in chair;Other (comment)(polar care in place and towel roll under heel, RN apply AV1) Nurse Communication: Mobility status PT Visit Diagnosis: Muscle weakness (generalized) (M62.81);Difficulty in walking, not elsewhere classified (R26.2)     Time: 1520-1550 PT Time Calculation (min) (ACUTE ONLY): 30 min  Charges:  $Gait Training: 8-22 mins $Therapeutic Exercise: 8-22 mins                    G Codes:       Lyndel Safe Keishla Oyer PT, DPT     Tylar Amborn 08/20/2017, 4:03 PM

## 2017-08-20 NOTE — Progress Notes (Signed)
Physical Therapy Treatment Patient Details Name: [name] MRN: [id number] DOB: 11-Aug-1940 Today's Date: 08/20/2017    History of Present Illness 77 y/o female s/p R TKA 08/19/17.    PT Comments    Increased time required, head of bed elevated and bed rails utilized for bed mobility. No external assist required from therapist. Pt requires elevated bed to come to standing. Cues for safe hand placement. Once upright pt is steady with UE support on rolling walker. Patient is able to ambulate from bed to hallway and back to recliner. Cues for proper sequencing with walker required. Patient denies shortness of breath with ambulation and vital signs are stable throughout. No external support provided by therapist. Gait is very slow with notable fatigue. She is able to complete all bed exercises as instructed. She reports an increase in pain with exercises but mild. Pt will benefit from PT services to address deficits in strength, balance, and mobility in order to return to full function at home.   Follow Up Recommendations  SNF(per progress)     Equipment Recommendations  Other (comment)(TBD at next venue)    Recommendations for Other Services       Precautions / Restrictions Precautions Precautions: Knee;Fall Restrictions RLE Weight Bearing: Weight bearing as tolerated    Mobility  Bed Mobility Overal bed mobility: Modified Independent Bed Mobility: Supine to Sit           General bed mobility comments: Increased time required, head of bed elevated and bed rails utilized. No external assist required from therapist  Transfers Overall transfer level: Needs assistance Equipment used: Rolling walker (2 wheeled) Transfers: Sit to/from Stand Sit to Stand: Min assist         General transfer comment: Pt requires elevated bed to come to standing. Cues for safe hand placement. Once upright pt is steady with UE support on rolling walker  Ambulation/Gait Ambulation/Gait  assistance: Min guard Ambulation Distance (Feet): 30 Feet Assistive device: Rolling walker (2 wheeled)   Gait velocity: Decreased   General Gait Details: Patient is able to ambulate from bed to hallway and back to recliner. Cues for proper sequencing with walker required. Patient denies shortness of breath with ambulation and vital signs are stable throughout. No external support provided by therapist   Stairs            Wheelchair Mobility    Modified Rankin (Stroke Patients Only)       Balance Overall balance assessment: Modified Independent                                          Cognition Arousal/Alertness: Awake/alert Behavior During Therapy: WFL for tasks assessed/performed Overall Cognitive Status: Within Functional Limits for tasks assessed                                        Exercises Total Joint Exercises Ankle Circles/Pumps: Both;10 reps Quad Sets: Both;10 reps Gluteal Sets: Both;10 reps Towel Squeeze: Both;10 reps Short Arc Quad: Right;10 reps Heel Slides: Right;10 reps Hip ABduction/ADduction: Right;10 reps Straight Leg Raises: Right;10 reps Goniometric ROM: -2 to 89 degrees AROM, pain limited    General Comments        Pertinent Vitals/Pain Pain Assessment: 0-10 Pain Score: 1  Pain Location:  R knee, pain increased  with activity Pain Descriptors / Indicators: Pressure Pain Intervention(s): Monitored during session    Home Living                      Prior Function            PT Goals (current goals can now be found in the care plan section) Acute Rehab PT Goals Patient Stated Goal: go to rehab to get stronger PT Goal Formulation: With patient Time For Goal Achievement: 09/02/17 Potential to Achieve Goals: Good Progress towards PT goals: Progressing toward goals    Frequency    BID      PT Plan Current plan remains appropriate    Co-evaluation              AM-PAC PT "6  Clicks" Daily Activity  Outcome Measure  Difficulty turning over in bed (including adjusting bedclothes, sheets and blankets)?: A Little Difficulty moving from lying on back to sitting on the side of the bed? : A Lot Difficulty sitting down on and standing up from a chair with arms (e.g., wheelchair, bedside commode, etc,.)?: Unable Help needed moving to and from a bed to chair (including a wheelchair)?: A Little Help needed walking in hospital room?: A Little Help needed climbing 3-5 steps with a railing? : A Lot 6 Click Score: 14    End of Session Equipment Utilized During Treatment: Gait belt Activity Tolerance: Patient tolerated treatment well Patient left: with call bell/phone within reach;with chair alarm set;with nursing/sitter in room;in chair;Other (comment)(polar care in place and towel roll under heel, RN apply AV1) Nurse Communication: Mobility status PT Visit Diagnosis: Muscle weakness (generalized) (M62.81);Difficulty in walking, not elsewhere classified (R26.2)     Time: 9150-5697 PT Time Calculation (min) (ACUTE ONLY): 28 min  Charges:  $Gait Training: 8-22 mins $Therapeutic Exercise: 8-22 mins                    G Codes:       [name] PT, DPT     [name] 08/20/2017, 10:29 AM

## 2017-08-20 NOTE — Clinical Social Work Placement (Signed)
Patient has bed available when medically stable at [facility].  CLINICAL SOCIAL WORK PLACEMENT  NOTE  Date:  08/20/2017  Patient Details  Name: [name] MRN: [id number] Date of Birth: [birth year]  Clinical Social Work is seeking post-discharge placement for this patient at the [place] level of care (*CSW will initial, date and re-position this form in  chart as items are completed):  Yes   Patient/family provided with [place] Work Department's list of facilities offering this level of care within the geographic area requested by the patient (or if unable, by the patient's family).  Yes   Patient/family informed of their freedom to choose among providers that offer the needed level of care, that participate in Medicare, Medicaid or managed care program needed by the patient, have an available bed and are willing to accept the patient.      Patient/family informed of [place]'s ownership interest in [facility] and [facility], as well as of the fact that they are under no obligation to receive care at these facilities.  PASRR submitted to EDS on 08/20/17     PASRR number received on 08/20/17     Existing PASRR number confirmed on       FL2 transmitted to all facilities in geographic area requested by pt/family on 08/20/17     FL2 transmitted to all facilities within larger geographic area on       Patient informed that his/her managed care company has contracts with or will negotiate with certain facilities, including the following:            Patient/family informed of bed offers received.  08/20/2017   Patient chooses bed at     [facility]  Physician recommends and patient chooses bed at     SNF Patient to be transferred to   on  .  Patient to be transferred to facility by       Patient family notified on   of transfer.  Name of family member notified:        PHYSICIAN Please sign FL2     Additional  Comment:    _______________________________________________ Lilly Cove, LCSW 08/20/2017, 2:05 PM

## 2017-08-20 NOTE — Clinical Social Work Note (Signed)
Clinical Social Work Assessment  Patient Details  Name: [name] MRN: [id number] Date of Birth: 1940-08-22  Date of referral:  08/20/17               Reason for consult:  Facility Placement, Discharge Planning                Permission sought to share information with:  Case Manager, Customer service manager, Family Supports Permission granted to share information::  Yes, Verbal Permission Granted  Name::        Agency::  Would like [facility]   Relationship::  Daughter, Merdis Delay Information:     Housing/Transportation Living arrangements for the past 2 months:  [place] of Information:  Patient, Medical Team, Facility, Adult Children Patient Interpreter Needed:  None Criminal Activity/Legal Involvement Pertinent to Current Situation/Hospitalization:  No - Comment as needed Significant Relationships:  Adult Children, Warehouse manager, Other Family Members Lives with:  Self Do you feel safe going back to the place where you live?  No Need for family participation in patient care:  No (Coment)  Care giving concerns:  Patient admitted to [facility] due to 1 Day Post-Op Procedure(s) (LRB): TOTAL KNEE ARTHROPLASTY (Right)  Patient was a planned surgery wanting short term rehab at discharge with [facility] as her first choice.  Social Worker assessment / plan:  LCSW completed assessment and updated by CM who is also following patient. Patient agreeable to SNF, completed SNF work up and awaiting bed offers.  Anticipate DC to SNF once medically stable and patient has her 3 midnight qualifying stay.   Employment status:  Retired Forensic scientist:  [employer] PT Recommendations:  [place] / Referral to community resources:  [place]  Patient/Family's Response to care:  Understanding and agreeable  Patient/Family's Understanding of and Emotional Response to Diagnosis, Current Treatment, and  Prognosis:  Patient proactive and motivated to complete therapy to get home safely after rehab.  Emotional Assessment Appearance:  Appears stated age Attitude/Demeanor/Rapport:  Engaged Affect (typically observed):  Accepting, Adaptable Orientation:  Oriented to Self, Oriented to Place, Oriented to  Time, Oriented to Situation Alcohol / Substance use:  Not Applicable Psych involvement (Current and /or in the community):  No (Comment)  Discharge Needs  Concerns to be addressed:  Discharge Planning Concerns Readmission within the last 30 days:  No Current discharge risk:  None Barriers to Discharge:  Continued Medical Work up   Lilly Cove, LCSW 08/20/2017, 11:39 AM

## 2017-08-20 NOTE — Anesthesia Postprocedure Evaluation (Signed)
Anesthesia Post Note  Patient: [name]  Procedure(s) Performed: TOTAL KNEE ARTHROPLASTY (Right Knee)  Patient location during evaluation: Nursing Unit Anesthesia Type: Spinal Level of consciousness: awake, awake and alert and oriented Pain management: pain level controlled Vital Signs Assessment: post-procedure vital signs reviewed and stable Respiratory status: spontaneous breathing, nonlabored ventilation and respiratory function stable Cardiovascular status: blood pressure returned to baseline and stable Postop Assessment: no headache and no backache Anesthetic complications: no     Last Vitals:  Vitals:   08/20/17 0027 08/20/17 0404  BP: (!) 160/65 (!) 119/54  Pulse: 70 63  Resp: 19 17  Temp: 36.7 C 37 C  SpO2: 100% 97%    Last Pain:  Vitals:   08/20/17 0718  TempSrc:   PainSc: Asleep                 Johnna Acosta

## 2017-08-21 ENCOUNTER — Inpatient Hospital Stay: Payer: Medicare Other

## 2017-08-21 LAB — CBC
HEMATOCRIT: 36.4 % (ref 35.0–47.0)
HEMOGLOBIN: 11.9 g/dL — AB (ref 12.0–16.0)
MCH: 29 pg (ref 26.0–34.0)
MCHC: 32.8 g/dL (ref 32.0–36.0)
MCV: 88.2 fL (ref 80.0–100.0)
Platelets: 132 10*3/uL — ABNORMAL LOW (ref 150–440)
RBC: 4.13 MIL/uL (ref 3.80–5.20)
RDW: 14.4 % (ref 11.5–14.5)
WBC: 6.7 10*3/uL (ref 3.6–11.0)

## 2017-08-21 LAB — TYPE AND SCREEN
ABO/RH(D): O POS
Antibody Screen: POSITIVE
Unit division: 0
Unit division: 0

## 2017-08-21 LAB — URINALYSIS, ROUTINE W REFLEX MICROSCOPIC
BILIRUBIN URINE: NEGATIVE
Glucose, UA: NEGATIVE mg/dL
HGB URINE DIPSTICK: NEGATIVE
Ketones, ur: NEGATIVE mg/dL
Leukocytes, UA: NEGATIVE
NITRITE: NEGATIVE
PROTEIN: NEGATIVE mg/dL
Specific Gravity, Urine: 1.017 (ref 1.005–1.030)
pH: 6 (ref 5.0–8.0)

## 2017-08-21 LAB — BASIC METABOLIC PANEL
ANION GAP: 7 (ref 5–15)
BUN: 13 mg/dL (ref 6–20)
CALCIUM: 9.1 mg/dL (ref 8.9–10.3)
CO2: 23 mmol/L (ref 22–32)
Chloride: 104 mmol/L (ref 101–111)
Creatinine, Ser: 0.96 mg/dL (ref 0.44–1.00)
GFR calc Af Amer: >60 mL/min (ref >60)
GFR calc non Af Amer: 56 mL/min — ABNORMAL LOW (ref >60)
GLUCOSE: 163 mg/dL — AB (ref 65–99)
POTASSIUM: 4.5 mmol/L (ref 3.5–5.1)
Sodium: 134 mmol/L — ABNORMAL LOW (ref 135–145)

## 2017-08-21 LAB — BPAM RBC
BLOOD PRODUCT EXPIRATION DATE: 201903012359
Blood Product Expiration Date: 201903012359
UNIT TYPE AND RH: 5100
Unit Type and Rh: 5100

## 2017-08-21 MED ORDER — OXYCODONE HCL 5 MG PO TABS: 5.0000 mg | ORAL_TABLET | ORAL | 0 refills | Status: DC | PRN

## 2017-08-21 MED ORDER — TRAMADOL HCL 50 MG PO TABS: 50.0000 mg | ORAL_TABLET | Freq: Four times a day (QID) | ORAL | 0 refills | Status: DC | PRN

## 2017-08-21 MED ORDER — ENOXAPARIN SODIUM 40 MG/0.4ML ~~LOC~~ SOLN: 40.0000 mg | SUBCUTANEOUS | 0 refills | Status: DC

## 2017-08-21 NOTE — Discharge Instructions (Signed)

## 2017-08-21 NOTE — Progress Notes (Signed)
Physical Therapy Treatment Patient Details Name: [name] MRN: [id number] DOB: Jun 15, 1941 Today's Date: 08/21/2017    History of Present Illness 77 y/o female s/p R TKA 08/19/17.    PT Comments    Pt with increased weakness today compared to yesterday. She is requiring a heavy modA+1 to transfer from recliner and BSC. Gait is very slow and labored and pt takes an excessive amount of time to get from [facility] to door and back to bed. Pt will most certainly need SNF placement at discharge. Pt will benefit from PT services to address deficits in strength, balance, and mobility in order to return to full function at home.    Follow Up Recommendations  SNF     Equipment Recommendations  Other (comment)(TBD at next venue)    Recommendations for Other Services       Precautions / Restrictions Precautions Precautions: Knee;Fall Restrictions Weight Bearing Restrictions: Yes RLE Weight Bearing: Weight bearing as tolerated    Mobility  Bed Mobility Overal bed mobility: Needs Assistance Bed Mobility: Sit to Supine       Sit to supine: Min assist   General bed mobility comments: Pt requires assist for RLE when returning to bed. Moves slowly and is very guarded secondary to pain  Transfers Overall transfer level: Needs assistance Equipment used: Rolling walker (2 wheeled) Transfers: Sit to/from Stand Sit to Stand: Mod assist         General transfer comment: Performed transfers with patient from [facility]. Pt is much weaker today compared to yesterday and requires modA+1 to come to standing. Signficant increase in time and cues for sequencing. Pt reporting increased levels of pain  Ambulation/Gait Ambulation/Gait assistance: Min guard Ambulation Distance (Feet): 30 Feet Assistive device: Rolling walker (2 wheeled)   Gait velocity: Decreased Gait velocity interpretation: <1.8 ft/sec, indicative of risk for recurrent falls General Gait Details: Pt takes an excessive amount of  time to ambulate from [facility] to door and back to bed. She moves very slowly with antalgic gait and decreased weight shifting to RLE. Cues for sequencing with walker, increasing step length, safe turning, and standing inside confines of walker   Stairs            Wheelchair Mobility    Modified Rankin (Stroke Patients Only)       Balance Overall balance assessment: Modified Independent                                          Cognition Arousal/Alertness: Awake/alert Behavior During Therapy: WFL for tasks assessed/performed Overall Cognitive Status: Within Functional Limits for tasks assessed                                        Exercises      General Comments        Pertinent Vitals/Pain Pain Assessment: 0-10 Pain Score: 10-Worst pain ever Pain Location:  R knee Pain Descriptors / Indicators: Operative site guarding Pain Intervention(s): Monitored during session;Patient requesting pain meds-RN notified    Home Living                      Prior Function            PT Goals (current goals can now be found in the care plan  section) Acute Rehab PT Goals Patient Stated Goal: go to rehab to get stronger PT Goal Formulation: With patient Time For Goal Achievement: 09/02/17 Potential to Achieve Goals: Good Progress towards PT goals: Not progressing toward goals - comment(Pt declined today with respect to mobility)    Frequency    BID      PT Plan Current plan remains appropriate    Co-evaluation              AM-PAC PT "6 Clicks" Daily Activity  Outcome Measure  Difficulty turning over in bed (including adjusting bedclothes, sheets and blankets)?: Unable Difficulty moving from lying on back to sitting on the side of the bed? : Unable Difficulty sitting down on and standing up from a chair with arms (e.g., wheelchair, bedside commode, etc,.)?: Unable Help needed moving to and from a bed to chair (including  a wheelchair)?: A Lot Help needed walking in hospital room?: A Little Help needed climbing 3-5 steps with a railing? : A Lot 6 Click Score: 10    End of Session Equipment Utilized During Treatment: Gait belt Activity Tolerance: Patient tolerated treatment well Patient left: with call bell/phone within reach;with chair alarm set;with nursing/sitter in room;in chair;Other (comment)(towel roll under heel, RN to replace polar care)   PT Visit Diagnosis: Muscle weakness (generalized) (M62.81);Difficulty in walking, not elsewhere classified (R26.2)     Time: 5400-8676 PT Time Calculation (min) (ACUTE ONLY): 27 min  Charges:  $Gait Training: 8-22 mins $Therapeutic Activity: 8-22 mins                    G Codes:      [name] PT, DPT     Persis Graffius 08/21/2017, 4:33 PM

## 2017-08-21 NOTE — Progress Notes (Signed)
Physical Therapy Treatment Patient Details Name: [name] MRN: [id number] DOB: 11-02-1940 Today's Date: 08/21/2017    History of Present Illness 77 y/o female s/p R TKA 08/19/17.    PT Comments    Pt is more painful and weak today during session. She requires assist for bed mobility for RLE management. Pt moves slowly with bed mobility and transfers requiring elevated bed and modA+1 to come to standing. Pt with decrease in step length and speed today. Pt reminded of proper sequencing with rolling walker. Cues for increased L step length. She is only able to ambulate to door and back to bed with therapy. AAROM is 0-79 degrees and primarily limited by pain. Slightly worse flexion today compared to yesterdays measurements. Pt will benefit from PT services to address deficits in strength, balance, and mobility in order to return to full function at home.    Follow Up Recommendations  SNF(per progress)     Equipment Recommendations  Other (comment)(TBD at next venue)    Recommendations for Other Services       Precautions / Restrictions Precautions Precautions: Knee;Fall Restrictions Weight Bearing Restrictions: Yes RLE Weight Bearing: Weight bearing as tolerated    Mobility  Bed Mobility Overal bed mobility: Needs Assistance Bed Mobility: Supine to Sit     Supine to sit: Min assist     General bed mobility comments: Pt requiring minA+1 to assist with RLE when exiting bed. HOB elevated, use of bed rails, and increased time required  Transfers Overall transfer level: Needs assistance Equipment used: Rolling walker (2 wheeled) Transfers: Sit to/from Stand Sit to Stand: Mod assist         General transfer comment: Pt with increased weakness today. Requires bed elevated and modA+2 to come to standing. Extended time required to come to fully upright position  Ambulation/Gait Ambulation/Gait assistance: Min guard Ambulation Distance (Feet): 30 Feet Assistive device:  Rolling walker (2 wheeled)   Gait velocity: Decreased Gait velocity interpretation: <1.8 ft/sec, indicative of risk for recurrent falls General Gait Details: Pt with decrease step length and speed today. Pt reminded of proper sequencing with rolling walker. Cues for increased L step length   Stairs            Wheelchair Mobility    Modified Rankin (Stroke Patients Only)       Balance Overall balance assessment: Modified Independent                                          Cognition Arousal/Alertness: Awake/alert Behavior During Therapy: WFL for tasks assessed/performed Overall Cognitive Status: Within Functional Limits for tasks assessed                                        Exercises Total Joint Exercises Ankle Circles/Pumps: Both;10 reps Quad Sets: Both;10 reps Gluteal Sets: Both;10 reps Towel Squeeze: Both;10 reps Short Arc Quad: Right;10 reps Heel Slides: Right;10 reps Hip ABduction/ADduction: Right;10 reps Straight Leg Raises: Right;10 reps Goniometric ROM: 0-79 degrees AAROM, pain limited in flexion today    General Comments        Pertinent Vitals/Pain Pain Assessment: 0-10 Pain Score: 4  Pain Location:  R knee, pain increased with activity Pain Descriptors / Indicators: Operative site guarding Pain Intervention(s): Monitored during session    Home  Living                      Prior Function            PT Goals (current goals can now be found in the care plan section) Acute Rehab PT Goals Patient Stated Goal: go to rehab to get stronger PT Goal Formulation: With patient Time For Goal Achievement: 09/02/17 Potential to Achieve Goals: Good Progress towards PT goals: Not progressing toward goals - comment(Pt is more weak and painful today)    Frequency    BID      PT Plan Current plan remains appropriate    Co-evaluation              AM-PAC PT "6 Clicks" Daily Activity  Outcome  Measure  Difficulty turning over in bed (including adjusting bedclothes, sheets and blankets)?: A Little Difficulty moving from lying on back to sitting on the side of the bed? : Unable Difficulty sitting down on and standing up from a chair with arms (e.g., wheelchair, bedside commode, etc,.)?: Unable Help needed moving to and from a bed to chair (including a wheelchair)?: A Little Help needed walking in hospital room?: A Little Help needed climbing 3-5 steps with a railing? : A Lot 6 Click Score: 13    End of Session Equipment Utilized During Treatment: Gait belt Activity Tolerance: Patient tolerated treatment well Patient left: with call bell/phone within reach;with chair alarm set;with nursing/sitter in room;in chair;Other (comment)(polar care in place and towel roll under heel, CNA apply AV1) Nurse Communication: Other (comment)(Pt needs incentive spirometer) PT Visit Diagnosis: Muscle weakness (generalized) (M62.81);Difficulty in walking, not elsewhere classified (R26.2)     Time: 1010-1044 PT Time Calculation (min) (ACUTE ONLY): 34 min  Charges:  $Gait Training: 8-22 mins $Therapeutic Exercise: 8-22 mins                    G Codes:       Lyndel Safe Signe Tackitt PT, DPT     Dequante Tremaine 08/21/2017, 11:06 AM

## 2017-08-21 NOTE — Discharge Summary (Signed)
Physician Discharge Summary  Patient ID: [name] MRN: [id number] DOB/AGE: [birth year] 77 y.o.  Admit date: 08/19/2017 Discharge date: 08/22/17  Admission Diagnoses:  primary osteoarthritis of right knee  Discharge Diagnoses: Patient Active Problem List   Diagnosis Date Noted  . Status post total knee replacement using cement, right 08/19/2017  . Venous reflux 07/02/2017  . Lymphedema 07/02/2017  . Status post total knee replacement using cement, left 04/08/2017  . Bilateral lower extremity edema 03/31/2017  . Hyperlipidemia 03/31/2017  . Essential hypertension 03/31/2017  Primary osteoarthritis of right knee  Past Medical History:  Diagnosis Date  . Acquired hammertoe    other  . Angina pectoris, unspecified ([place])   . Arthritis    knees  . Cancer ([place]) 1990,s   left breast  . Degenerative joint disease   . Dermatophytosis of nail   . Diabetes mellitus type 2, uncomplicated ([place])   . Essential hypertension, benign   . Exostosis    unspecified site  . Fracture of right ankle 2010   Followed by Dr. Vickki Muff  . Hypercholesterolemia   . Hyperlipidemia, unspecified   . Hypertension   . Lymphedema of leg    left leg worse than right leg.  . Obesity, unspecified   . Osteoarthrosis involving lower leg    unspecified whether generalized or localized   . Pre-diabetes   . Pure hypercholesterolemia   . Vitamin D deficiency, unspecified      Transfusion: None.   Consultants (if any):   Discharged Condition: Improved  Hospital Course: [name] is an 77 y.o. female who was admitted 08/19/2017 with a diagnosis of right knee primary osteoarthritis and went to the operating room on 08/19/2017 and underwent the above named procedures.    Surgeries: Procedure(s): TOTAL KNEE ARTHROPLASTY on 08/19/2017 Patient tolerated the surgery well. Taken to PACU where she was stabilized and then transferred to the orthopedic floor.  Started on Lovenox 40 q 24 hrs. Foot pumps  applied bilaterally at 80 mm. Heels elevated on bed with rolled towels. No evidence of DVT. Negative [place]. Physical therapy started on day #1 for gait training and transfer. OT started day #1 for ADL and assisted devices.  Patient's IV was d/c on POD1.  The patient's foley was removed following surgery.  Implants: Right TKA using all-cemented [employer] Vanguard system with a 72.5 mm PCR femur, a 79 mm tibial tray with a 14 mm E-poly AS insert, and a 37 x 10 mm all-poly 3-pegged domed patella.  She was given perioperative antibiotics:  Anti-infectives (From admission, onward)   Start     Dose/Rate Route Frequency Ordered Stop   08/19/17 1530  ceFAZolin (ANCEF) IVPB 2g/100 mL premix     2 g 200 mL/hr over 30 Minutes Intravenous Every 6 hours 08/19/17 1440 08/20/17 0440   08/19/17 0603  ceFAZolin (ANCEF) 2-4 GM/100ML-% IVPB    Comments:  Tobin Chad, Eve   : cabinet override      08/19/17 0603 08/19/17 0806   08/18/17 2200  ceFAZolin (ANCEF) IVPB 2g/100 mL premix     2 g 200 mL/hr over 30 Minutes Intravenous  Once 08/18/17 2156 08/19/17 0816    .  She was given sequential compression devices, early ambulation, and Lovenox for DVT prophylaxis.  She benefited maximally from the hospital stay and there were no complications.    Recent vital signs:  Vitals:   08/22/17 0059 08/22/17 0438  BP: (!) 126/56 118/63  Pulse: 75 60  Resp: 16 18  Temp:  98.7 F (37.1 C)  SpO2: 100% 96%    Recent laboratory studies:  Lab Results  Component Value Date   HGB 10.3 (L) 08/22/2017   HGB 11.9 (L) 08/21/2017   HGB 12.4 08/20/2017   Lab Results  Component Value Date   WBC 6.2 08/22/2017   PLT 124 (L) 08/22/2017   Lab Results  Component Value Date   INR 1.00 08/08/2017   Lab Results  Component Value Date   NA 134 (L) 08/22/2017   K 4.2 08/22/2017   CL 105 08/22/2017   CO2 23 08/22/2017   BUN 16 08/22/2017   CREATININE 0.84 08/22/2017   GLUCOSE 132 (H) 08/22/2017    Discharge  Medications:   Allergies as of 08/22/2017      Reactions   Colesevelam Other (See Comments)   constipation   Oxybutynin Swelling   Leg swelling   Rosuvastatin Other (See Comments)   Pain in lower extremities      Medication List    TAKE these medications   acetaminophen 500 MG tablet Commonly known as:  TYLENOL Take 500 mg by mouth every 8 (eight) hours as needed for mild pain (takes 1 dose every morning with breakfast).   aspirin EC 81 MG tablet Take 81 mg by mouth daily.   atorvastatin 20 MG tablet Commonly known as:  LIPITOR Take 20 mg by mouth at bedtime.   enoxaparin 40 MG/0.4ML injection Commonly known as:  LOVENOX Inject 0.4 mLs (40 mg total) into the skin daily.   furosemide 40 MG tablet Commonly known as:  LASIX Take one tablet by mouth once daily as needed for lower extremity edema   gabapentin 300 MG capsule Commonly known as:  NEURONTIN Take 300 mg by mouth at bedtime.   losartan 25 MG tablet Commonly known as:  COZAAR Take 25 mg by mouth daily.   magnesium oxide 400 MG tablet Commonly known as:  MAG-OX Take 400 mg by mouth daily.   nystatin cream Commonly known as:  MYCOSTATIN Apply 1 application topically 2 (two) times daily as needed. Apply thin film to yeast rash under the breasts   Omega-3 Krill Oil 500 MG Caps Take 500 mg by mouth 3 (three) times a week.   omeprazole 20 MG capsule Commonly known as:  PRILOSEC Take 20 mg by mouth daily. Acid reflux   oxyCODONE 5 MG immediate release tablet Commonly known as:  Oxy IR/ROXICODONE Take 1-2 tablets (5-10 mg total) by mouth every 4 (four) hours as needed for moderate pain.   potassium chloride 10 MEQ tablet Commonly known as:  K-DUR,KLOR-CON Take 10 mEq by mouth daily.   sennosides-docusate sodium 8.6-50 MG tablet Commonly known as:  SENOKOT-S Take 2 tablets by mouth 2 (two) times daily.   solifenacin 10 MG tablet Commonly known as:  VESICARE Take 1 tablet (10 mg total) by mouth daily.    TOPROL XL 25 MG 24 hr tablet Generic drug:  metoprolol succinate Take 25 mg by mouth at bedtime.   torsemide 20 MG tablet Commonly known as:  DEMADEX Take 20 mg by mouth daily as needed (fluid).   traMADol 50 MG tablet Commonly known as:  ULTRAM Take 1-2 tablets (50-100 mg total) by mouth every 6 (six) hours as needed for severe pain. What changed:    how much to take  when to take this  reasons to take this   VOLTAREN 1 % Gel Generic drug:  diclofenac sodium Apply 1 application topically 3 (three) times  daily as needed for pain.       Diagnostic Studies: Dg Chest 1 View  Result Date: 08/21/2017 CLINICAL DATA:  Acute onset of fever and generalized weakness. EXAM: Portable CHEST 1 VIEW COMPARISON:  08/08/2017, 04/10/2017, 03/12/2017. FINDINGS: Suboptimal inspiration accounts for crowded bronchovascular markings, especially in the bases, and accentuates the cardiac silhouette. Taking this into account, cardiac silhouette mildly enlarged, unchanged. Right upper lobe scarring, unchanged. Lungs otherwise clear. No localized airspace consolidation. No pleural effusions. No pneumothorax. Normal pulmonary vascularity. Incidental note of calcification involving the inferior labrum of the left shoulder joint. IMPRESSION: 1. Suboptimal inspiration.  No acute cardiopulmonary disease. 2. Stable mild cardiomegaly without pulmonary edema. Stable right upper lobe scarring. Electronically Signed   By: Evangeline Dakin M.D.   On: 08/21/2017 19:03   Dg Chest 2 View  Result Date: 08/08/2017 CLINICAL DATA:  preop for knee surgery next week, non smoker, diabetic, no known heart/lung disease EXAM: CHEST  2 VIEW COMPARISON:  Chest x-ray dated 04/10/2017. FINDINGS: Heart size is upper normal, stable. Overall cardiomediastinal silhouette is stable. Atherosclerotic changes noted at the aortic arch. Lungs are clear.  No pleural effusion. Degenerative changes throughout the kyphotic thoracic spine, mild to  moderate in degree. No acute or suspicious osseous finding. IMPRESSION: 1. No active cardiopulmonary disease. No evidence of pneumonia or pulmonary edema. 2. Heart size is upper normal, stable. 3. Aortic atherosclerosis. Electronically Signed   By: Franki Cabot M.D.   On: 08/08/2017 14:55   Dg Knee Right Port  Result Date: 08/19/2017 CLINICAL DATA:  Status post right knee replacement today. EXAM: PORTABLE RIGHT KNEE - 1-2 VIEW COMPARISON:  None. FINDINGS: Right total knee arthroplasty is in place. Surgical drain and staples are noted. There is some gas in the soft tissues from surgery. No acute abnormality. IMPRESSION: Status post right knee replacement.  No acute finding. Electronically Signed   By: Inge Rise M.D.   On: 08/19/2017 10:50   Disposition: Plan is for discharge to SNF on 08/22/17 pending progress with PT and having a BM.   Contact information for follow-up providers    Lattie Corns, PA-C Follow up in 14 day(s).   Specialty:  Physician Assistant Why:  Electa Sniff information: [place] [id number] [phone]            Contact information for after-discharge care    Destination    [facility] Follow up.   Service:  Skilled Nursing Contact information: [address] [place] [phone]                 Signed: Judson Roch PA-C 08/22/2017, 7:43 AM

## 2017-08-21 NOTE — Progress Notes (Signed)
  Subjective: 2 Days Post-Op Procedure(s) (LRB): TOTAL KNEE ARTHROPLASTY (Right) Patient reports pain as 8 on 0-10 scale.   Patient is well, and has had no acute complaints or problems Plan is to go Skilled nursing facility after hospital stay. Negative for chest pain and shortness of breath Fever: Yes, 100 degree temp last night Gastrointestinal:Negative for nausea and vomiting. Objective: Vital signs in last 24 hours: Temp:  [97.9 F (36.6 C)-100.1 F (37.8 C)] 98.9 F (37.2 C) (02/21 0836) Pulse Rate:  [75-79] 75 (02/21 0836) Resp:  [16-18] 18 (02/21 0547) BP: (124-176)/(53-68) 141/54 (02/21 0836) SpO2:  [93 %-99 %] 97 % (02/21 0836)  Intake/Output from previous day:  Intake/Output Summary (Last 24 hours) at 08/21/2017 1306 Last data filed at 08/21/2017 0900 Gross per 24 hour  Intake 1563.75 ml  Output 950 ml  Net 613.75 ml    Intake/Output this shift: Total I/O In: 240 [P.O.:240] Out: -   Labs: Recent Labs    08/20/17 0349 08/21/17 0407  HGB 12.4 11.9*   Recent Labs    08/20/17 0349 08/21/17 0407  WBC 5.7 6.7  RBC 4.20 4.13  HCT 37.0 36.4  PLT 128* 132*   Recent Labs    08/20/17 0349 08/21/17 0407  NA 139 134*  K 4.1 4.5  CL 110 104  CO2 24 23  BUN 15 13  CREATININE 0.89 0.96  GLUCOSE 119* 163*  CALCIUM 8.7* 9.1   No results for input(s): LABPT, INR in the last 72 hours.   EXAM General - Patient is Alert, Appropriate and Oriented Extremity - Neurovascular intact Sensation intact distally Intact pulses distally Dorsiflexion/Plantar flexion intact Incision: scant drainage No cellulitis present Dressing/Incision - blood tinged drainage. Motor Function - intact, moving foot and toes well on exam.   Abdomen soft this morning, mild tympany.  BS normal.  Past Medical History:  Diagnosis Date  . Acquired hammertoe    other  . Angina pectoris, unspecified ([place])   . Arthritis    knees  . Cancer ([place]) 1990,s   left breast  . Degenerative  joint disease   . Dermatophytosis of nail   . Diabetes mellitus type 2, uncomplicated ([place])   . Essential hypertension, benign   . Exostosis    unspecified site  . Fracture of right ankle 2010   Followed by Dr. Vickki Muff  . Hypercholesterolemia   . Hyperlipidemia, unspecified   . Hypertension   . Lymphedema of leg    left leg worse than right leg.  . Obesity, unspecified   . Osteoarthrosis involving lower leg    unspecified whether generalized or localized   . Pre-diabetes   . Pure hypercholesterolemia   . Vitamin D deficiency, unspecified     Assessment/Plan: 2 Days Post-Op Procedure(s) (LRB): TOTAL KNEE ARTHROPLASTY (Right) Active Problems:   Status post total knee replacement using cement, right  Estimated body mass index is 34.41 kg/m as calculated from the following:   Height as of this encounter: 5\' 9"  (1.753 m).   Weight as of this encounter: 105.7 kg (233 lb). Advance diet Up with therapy  Labs reviewed this AM. Hg 11.9 Fevers last night, 100.1, WBC 6.7 today. Encouraged incentive spirometer, will continue to monitor fevers. Up with therapy today, current plan is for discharge to SNF on Friday. Begin working on having a BM.  DVT Prophylaxis - Lovenox, Foot Pumps and TED hose Weight-Bearing as tolerated to right leg  J. Cameron Proud, PA-C [facility] 08/21/2017, 1:06 PM

## 2017-08-22 ENCOUNTER — Other Ambulatory Visit: Payer: Self-pay

## 2017-08-22 LAB — CBC WITH DIFFERENTIAL/PLATELET
BASOS PCT: 0 %
Basophils Absolute: 0 10*3/uL (ref 0–0.1)
Eosinophils Absolute: 0.1 10*3/uL (ref 0–0.7)
Eosinophils Relative: 1 %
HEMATOCRIT: 30.4 % — AB (ref 35.0–47.0)
Hemoglobin: 10.3 g/dL — ABNORMAL LOW (ref 12.0–16.0)
Lymphocytes Relative: 17 %
Lymphs Abs: 1 10*3/uL (ref 1.0–3.6)
MCH: 29.4 pg (ref 26.0–34.0)
MCHC: 33.8 g/dL (ref 32.0–36.0)
MCV: 87 fL (ref 80.0–100.0)
Monocytes Absolute: 1 10*3/uL — ABNORMAL HIGH (ref 0.2–0.9)
Monocytes Relative: 16 %
NEUTROS ABS: 4 10*3/uL (ref 1.4–6.5)
Neutrophils Relative %: 66 %
Platelets: 124 10*3/uL — ABNORMAL LOW (ref 150–440)
RBC: 3.5 MIL/uL — ABNORMAL LOW (ref 3.80–5.20)
RDW: 14.7 % — ABNORMAL HIGH (ref 11.5–14.5)
WBC: 6.2 10*3/uL (ref 3.6–11.0)

## 2017-08-22 LAB — BASIC METABOLIC PANEL
ANION GAP: 6 (ref 5–15)
BUN: 16 mg/dL (ref 6–20)
CALCIUM: 9.2 mg/dL (ref 8.9–10.3)
CO2: 23 mmol/L (ref 22–32)
CREATININE: 0.84 mg/dL (ref 0.44–1.00)
Chloride: 105 mmol/L (ref 101–111)
GFR calc Af Amer: >60 mL/min (ref >60)
GFR calc non Af Amer: >60 mL/min (ref >60)
GLUCOSE: 132 mg/dL — AB (ref 65–99)
Potassium: 4.2 mmol/L (ref 3.5–5.1)
Sodium: 134 mmol/L — ABNORMAL LOW (ref 135–145)

## 2017-08-22 MED ORDER — TRAMADOL HCL 50 MG PO TABS: 50.0000 mg | ORAL_TABLET | Freq: Four times a day (QID) | ORAL | 0 refills | Status: DC | PRN

## 2017-08-22 MED ORDER — OXYCODONE HCL 5 MG PO TABS: 5.0000 mg | ORAL_TABLET | ORAL | 0 refills | Status: DC | PRN

## 2017-08-22 NOTE — Progress Notes (Signed)
Physical Therapy Treatment Patient Details Name: [name] MRN: [id number] DOB: 25-Nov-1940 Today's Date: 08/22/2017    History of Present Illness 77 y/o female s/p R TKA 08/19/17.    PT Comments    Pt presents with deficits in strength, transfers, mobility, gait, balance, and activity tolerance.  Pt reported increased R knee pain this session even though was pre-medicated.  Pt very slow and cautious with all functional mobility tasks and continues to require significant assistance with bed mobility and transfers.  Pt able to amb 2 x 5' and 1 x 30' with RW with extremely slow, cautious cadence with decreased RLE stance time but was steady overall without LOB.  Pt will benefit from PT services in a SNF setting upon discharge to safely address above deficits for decreased caregiver assistance and eventual return to PLOF.     Follow Up Recommendations  SNF     Equipment Recommendations       Recommendations for Other Services       Precautions / Restrictions Precautions Precautions: Knee;Fall Restrictions Weight Bearing Restrictions: Yes RLE Weight Bearing: Weight bearing as tolerated    Mobility  Bed Mobility Overal bed mobility: Needs Assistance Bed Mobility: Sit to Supine;Supine to Sit     Supine to sit: Min assist Sit to supine: Min assist   General bed mobility comments: Pt requires assist for RLE when returning to bed. Moves slowly and is very guarded secondary to pain  Transfers Overall transfer level: Needs assistance Equipment used: Rolling walker (2 wheeled) Transfers: Sit to/from Stand Sit to Stand: Mod assist         General transfer comment: Pt very slow with all functional mobility tasks with extensive verbal and tactile cues required for proper sequencing during sit to/from stand transfers.  Ambulation/Gait Ambulation/Gait assistance: Min guard Ambulation Distance (Feet): 30 Feet Assistive device: Rolling walker (2 wheeled) Gait  Pattern/deviations: Step-through pattern;Decreased step length - left;Decreased stance time - right   Gait velocity interpretation: Below normal speed for age/gender General Gait Details: Very slow cadence with amb with decreased RLE stance time but steady without LOB.   Stairs            Wheelchair Mobility    Modified Rankin (Stroke Patients Only)       Balance Overall balance assessment: Needs assistance Sitting-balance support: No upper extremity supported;Feet supported Sitting balance-Leahy Scale: Good     Standing balance support: Bilateral upper extremity supported Standing balance-Leahy Scale: Good                              Cognition Arousal/Alertness: Awake/alert Behavior During Therapy: WFL for tasks assessed/performed Overall Cognitive Status: Within Functional Limits for tasks assessed                                        Exercises Total Joint Exercises Ankle Circles/Pumps: AROM;Both;5 reps;10 reps Quad Sets: Strengthening;Both;10 reps;15 reps Gluteal Sets: Strengthening;Both;5 reps;10 reps Straight Leg Raises: AAROM;Right;10 reps Long Arc Quad: AROM;Right;10 reps;15 reps Knee Flexion: AROM;Right;10 reps;15 reps Goniometric ROM: R knee AROM: 8-80 deg, limited by pain Marching in Standing: AROM;Both;5 reps;10 reps Other Exercises Other Exercises: HEP education and review for BLE APs, QS, GS, LAQ, and R knee flex x 10 each 5-6x/day Other Exercises: Positioning education provided to encourage R knee ext    General Comments  Pertinent Vitals/Pain Pain Assessment: 0-10 Pain Score: 9  Pain Location:  R knee Pain Descriptors / Indicators: Operative site guarding;Sore Pain Intervention(s): Premedicated before session;Monitored during session;Limited activity within patient's tolerance    Home Living                      Prior Function            PT Goals (current goals can now be found in the  care plan section) Progress towards PT goals: Progressing toward goals    Frequency    BID      PT Plan Current plan remains appropriate    Co-evaluation              AM-PAC PT "6 Clicks" Daily Activity  Outcome Measure                   End of Session Equipment Utilized During Treatment: Gait belt Activity Tolerance: Patient tolerated treatment well Patient left: in chair;with chair alarm set;with family/visitor present;with call bell/phone within reach Nurse Communication: Mobility status PT Visit Diagnosis: Muscle weakness (generalized) (M62.81);Difficulty in walking, not elsewhere classified (R26.2)     Time: 1410-3013 PT Time Calculation (min) (ACUTE ONLY): 45 min  Charges:  $Gait Training: 8-22 mins $Therapeutic Exercise: 8-22 mins $Therapeutic Activity: 8-22 mins                    G Codes:       D. Royetta Asal PT, DPT 08/22/17, 1:30 PM

## 2017-08-22 NOTE — Progress Notes (Signed)
Subjective: 3 Days Post-Op Procedure(s) (LRB): TOTAL KNEE ARTHROPLASTY (Right) Patient reports pain as 6 on 0-10 scale.   Patient is well, and has had no acute complaints or problems Plan is to go Skilled nursing facility after hospital stay. Negative for chest pain and shortness of breath Fever: No recent fevers, UA and CXR negative. Gastrointestinal:Negative for nausea and vomiting.  Objective: Vital signs in last 24 hours: Temp:  [98.6 F (37 C)-100.3 F (37.9 C)] 98.7 F (37.1 C) (02/22 0438) Pulse Rate:  [60-88] 60 (02/22 0438) Resp:  [16-18] 18 (02/22 0438) BP: (102-155)/(38-63) 118/63 (02/22 0438) SpO2:  [95 %-100 %] 96 % (02/22 0438)  Intake/Output from previous day:  Intake/Output Summary (Last 24 hours) at 08/22/2017 0742 Last data filed at 08/22/2017 0445 Gross per 24 hour  Intake 1260 ml  Output 300 ml  Net 960 ml    Intake/Output this shift: No intake/output data recorded.  Labs: Recent Labs    08/20/17 0349 08/21/17 0407 08/22/17 0601  HGB 12.4 11.9* 10.3*   Recent Labs    08/21/17 0407 08/22/17 0601  WBC 6.7 6.2  RBC 4.13 3.50*  HCT 36.4 30.4*  PLT 132* 124*   Recent Labs    08/21/17 0407 08/22/17 0601  NA 134* 134*  K 4.5 4.2  CL 104 105  CO2 23 23  BUN 13 16  CREATININE 0.96 0.84  GLUCOSE 163* 132*  CALCIUM 9.1 9.2   No results for input(s): LABPT, INR in the last 72 hours.   EXAM General - Patient is Alert, Appropriate and Oriented Extremity - Neurovascular intact Sensation intact distally Intact pulses distally Dorsiflexion/Plantar flexion intact Incision: scant drainage No cellulitis present Dressing/Incision - blood tinged drainage. Motor Function - intact, moving foot and toes well on exam.   Abdomen soft this morning, mild tympany.  BS normal.  Past Medical History:  Diagnosis Date  . Acquired hammertoe    other  . Angina pectoris, unspecified ([place])   . Arthritis    knees  . Cancer ([place]) 1990,s   left breast   . Degenerative joint disease   . Dermatophytosis of nail   . Diabetes mellitus type 2, uncomplicated ([place])   . Essential hypertension, benign   . Exostosis    unspecified site  . Fracture of right ankle 2010   Followed by Dr. Vickki Muff  . Hypercholesterolemia   . Hyperlipidemia, unspecified   . Hypertension   . Lymphedema of leg    left leg worse than right leg.  . Obesity, unspecified   . Osteoarthrosis involving lower leg    unspecified whether generalized or localized   . Pre-diabetes   . Pure hypercholesterolemia   . Vitamin D deficiency, unspecified     Assessment/Plan: 3 Days Post-Op Procedure(s) (LRB): TOTAL KNEE ARTHROPLASTY (Right) Active Problems:   Status post total knee replacement using cement, right  Estimated body mass index is 34.41 kg/m as calculated from the following:   Height as of this encounter: 5\' 9"  (1.753 m).   Weight as of this encounter: 105.7 kg (233 lb). Advance diet Up with therapy  Labs reviewed this AM. Hg 11.9 Fevers appear to have resolved, WBC 6.2, UA and CXR negative. Pt has had a small bowel movement. Up with therapy today, current plan is for discharge to SNF today. Dressing change prior to discharge to SNF this afternoon.  DVT Prophylaxis - Lovenox, Foot Pumps and TED hose Weight-Bearing as tolerated to right leg  J. Cameron Proud, PA-C [facility]  Orthopaedic Surgery 08/22/2017, 7:42 AM

## 2017-08-22 NOTE — Clinical Social Work Placement (Addendum)
Patient is medically stable to discharge toSNF today: [employer] with facility who is in agreement with plan and all DC paperwork sent to facility.  Family notifed of DC as well as Therapist, sports. Patient can transfer at: morning time, early is preferred before lunch Report: [id number] Rm: 210A  Patient can transfer by EMS per preference.  Will complete DC packet and arrnage for DC this AM.  [facility].  CLINICAL SOCIAL WORK PLACEMENT  NOTE  Date:  08/22/2017  Patient Details  Name: [name] MRN: [id number] Date of Birth: 07/30/1940  Clinical Social Work is seeking post-discharge placement for this patient at the [place] level of care (*CSW will initial, date and re-position this form in  chart as items are completed):  Yes   Patient/family provided with [place] Work Department's list of facilities offering this level of care within the geographic area requested by the patient (or if unable, by the patient's family).  Yes   Patient/family informed of their freedom to choose among providers that offer the needed level of care, that participate in Medicare, Medicaid or managed care program needed by the patient, have an available bed and are willing to accept the patient.      Patient/family informed of [facility]'s ownership interest in [facility] and [facility], as well as of the fact that they are under no obligation to receive care at these facilities.  PASRR submitted to EDS on 08/20/17     PASRR number received on 08/20/17     Existing PASRR number confirmed on       FL2 transmitted to all facilities in geographic area requested by pt/family on 08/20/17     FL2 transmitted to all facilities within larger geographic area on       Patient informed that his/her managed care company has contracts with or will negotiate with certain facilities, including the following:            Patient/family informed of bed offers received.  08/22/2017   Patient chooses bed at      [facility] Physician recommends and patient chooses bed at     [facility] Patient to be transferred to   on  . 08/22/2017   Patient to be transferred to facility by     EMS  Patient family notified on   of transfer. Family: Jarrett Soho (sister)  Name of family member notified:        PHYSICIAN Please sign FL2     Additional Comment:    _______________________________________________ Lilly Cove, LCSW 08/22/2017, 8:48 AM

## 2017-08-22 NOTE — Telephone Encounter (Signed)
Rx sent to [facility] phone : [phone] , fax : [phone]  

## 2017-08-22 NOTE — Progress Notes (Signed)
Patient is being discharged to [place], report called to [facility]. IV removed, honeycomb dressing changed and one honeycomb sent with the patient to SNF. EMS called.

## 2017-08-22 NOTE — Progress Notes (Signed)
EMS here to transport patient. VSS at time of discharge.

## 2017-08-25 ENCOUNTER — Non-Acute Institutional Stay (SKILLED_NURSING_FACILITY): Payer: Medicare Other | Admitting: Gerontology

## 2017-08-25 DIAGNOSIS — M1711 Unilateral primary osteoarthritis, right knee: Secondary | ICD-10-CM | POA: Diagnosis not present

## 2017-08-25 DIAGNOSIS — Z96651 Presence of right artificial knee joint: Secondary | ICD-10-CM

## 2017-08-29 ENCOUNTER — Encounter
Admission: RE | Admit: 2017-08-29 | Discharge: 2017-08-29 | Disposition: A | Discharge status: Home / Self Care | Payer: Medicare Other | Source: Ambulatory Visit | Attending: Internal Medicine | Admitting: Internal Medicine

## 2017-09-03 ENCOUNTER — Other Ambulatory Visit: Payer: Self-pay | Admitting: Student

## 2017-09-03 ENCOUNTER — Ambulatory Visit
Admission: RE | Admit: 2017-09-03 | Discharge: 2017-09-03 | Disposition: A | Discharge status: Home / Self Care | Payer: No Typology Code available for payment source | Source: Ambulatory Visit | Attending: Student | Admitting: Student

## 2017-09-03 DIAGNOSIS — Z96651 Presence of right artificial knee joint: Secondary | ICD-10-CM | POA: Insufficient documentation

## 2017-09-03 DIAGNOSIS — M7989 Other specified soft tissue disorders: Secondary | ICD-10-CM

## 2017-09-05 ENCOUNTER — Non-Acute Institutional Stay (SKILLED_NURSING_FACILITY): Payer: Medicare Other | Admitting: Gerontology

## 2017-09-05 ENCOUNTER — Encounter: Payer: Self-pay | Admitting: Gerontology

## 2017-09-05 DIAGNOSIS — Z96651 Presence of right artificial knee joint: Secondary | ICD-10-CM | POA: Diagnosis not present

## 2017-09-05 DIAGNOSIS — M1711 Unilateral primary osteoarthritis, right knee: Secondary | ICD-10-CM | POA: Diagnosis not present

## 2017-09-05 NOTE — Progress Notes (Signed)
Location:   [place] Room Number: [place] of Service:  SNF [phone]) Provider:  Toni Arthurs, NP-C  Glendon Axe, MD  Patient Care Team: Glendon Axe, MD as PCP - General (Internal Medicine)  Extended Emergency Contact Information Primary Emergency Contact: [facility] Address: [address]          [place], [place] [address] Johnnette Litter of [place] Phone: [phone] Relation: Daughter Secondary Emergency Contact: Wasta [place], [place] [address] Johnnette Litter of [place] Phone: [phone] Relation: Son  Code Status:  FULL Goals of care: Advanced Directive information Advanced Directives 09/05/2017  Does Patient Have a Medical Advance Directive? No  Would patient like information on creating a medical advance directive? No - Patient declined     Chief Complaint  Patient presents with  . Medical Management of Chronic Issues    Routine Visit    HPI:  Pt is a 77 y.o. female seen today for medical management of chronic diseases. Pt was admitted to the facility for rehab following hospitalization for Right Total Knee Replacement. Pt recently had a LTKA done several months ago. Pt has been participating in PT/OT. Pt has been progressing fairly well. As with the previous knee, pt c/o severe pain requiring multiple medication adjustments. Pt has RLE edema. Incision well approximated. No redness or drainage. Calves soft, supple. Negative Homan's sign. Pt reports appetite is good, voiding well and having regular BMs. VSS. No other complaints.       Past Medical History:  Diagnosis Date  . Acquired hammertoe    other  . Acquired hammertoe   . Angina pectoris, unspecified ([place])   . Angina pectoris, unspecified ([place])   . Arthritis    knees  . Cancer ([place]) 1990,s   left breast  . Degenerative joint disease   . Dermatophytosis of nail   . Diabetes mellitus type 2, uncomplicated ([place])   . Essential hypertension,  benign   . Exostosis    unspecified site  . Fracture of right ankle 2010   Followed by Dr. Vickki Muff  . Hypercholesterolemia   . Hyperlipidemia, unspecified   . Hypertension   . Lymphedema of leg    left leg worse than right leg.  . Obesity, unspecified   . Osteoarthrosis involving lower leg    unspecified whether generalized or localized   . Pre-diabetes   . Pure hypercholesterolemia   . Vitamin D deficiency, unspecified    Past Surgical History:  Procedure Laterality Date  . BREAST SURGERY Left 1990   lumpectomy  . BUNIONECTOMY Bilateral 1990's   plus hammer toe repair on left foot.  . ENDOMETRIAL BIOPSY    . FOOT SURGERY Left    for bunionectomy and hammertoe  . JOINT REPLACEMENT    . TONSILLECTOMY    . TOTAL KNEE ARTHROPLASTY Left 04/08/2017   Procedure: TOTAL KNEE ARTHROPLASTY;  Surgeon: Corky Mull, MD;  Location: [facility] ORS;  Service: Orthopedics;  Laterality: Left;  . TOTAL KNEE ARTHROPLASTY Right 08/19/2017   Procedure: TOTAL KNEE ARTHROPLASTY;  Surgeon: Corky Mull, MD;  Location: [facility] ORS;  Service: Orthopedics;  Laterality: Right;    Allergies  Allergen Reactions  . Colesevelam Other (See Comments)    constipation  . Oxybutynin Swelling    Leg swelling  . Rosuvastatin Other (See Comments)    Pain in lower extremities    Allergies as of 09/05/2017      Reactions  Colesevelam Other (See Comments)   constipation   Oxybutynin Swelling   Leg swelling   Rosuvastatin Other (See Comments)   Pain in lower extremities      Medication List        Accurate as of 09/05/17  1:57 PM. Always use your most recent med list.          acetaminophen 325 MG tablet Commonly known as:  TYLENOL Take 650 mg by mouth 4 (four) times daily.   aspirin EC 81 MG tablet Take 81 mg by mouth daily.   atorvastatin 20 MG tablet Commonly known as:  LIPITOR Take 20 mg by mouth at bedtime.   enoxaparin 40 MG/0.4ML injection Commonly known as:  LOVENOX Inject 0.4 mLs (40 mg  total) into the skin daily.   furosemide 40 MG tablet Commonly known as:  LASIX Take one tablet by mouth once daily as needed for lower extremity edema   gabapentin 300 MG capsule Commonly known as:  NEURONTIN Take 300 mg by mouth at bedtime.   lactulose 10 GM/15ML solution Commonly known as:  CHRONULAC Take 30 g by mouth every 12 (twelve) hours as needed.   losartan 25 MG tablet Commonly known as:  COZAAR Take 25 mg by mouth daily.   magnesium hydroxide 400 MG/5ML suspension Commonly known as:  MILK OF MAGNESIA Take 30 mLs by mouth 2 (two) times daily as needed.   magnesium oxide 400 MG tablet Commonly known as:  MAG-OX Take 400 mg by mouth daily.   methocarbamol 500 MG tablet Commonly known as:  ROBAXIN Take 500 mg by mouth 3 (three) times daily.   nystatin cream Commonly known as:  MYCOSTATIN Apply 1 application topically 2 (two) times daily as needed. Apply thin film to yeast rash under the breasts   Omega-3 Krill Oil 500 MG Caps Take 500 mg by mouth 3 (three) times a week. On Monday Wednesday and Friday   omeprazole 20 MG capsule Commonly known as:  PRILOSEC Take 20 mg by mouth daily. Acid reflux   oxyCODONE 5 MG immediate release tablet Commonly known as:  Oxy IR/ROXICODONE Take 1-2 tablets (5-10 mg total) by mouth every 4 (four) hours as needed for moderate pain.   potassium chloride 10 MEQ tablet Commonly known as:  K-DUR,KLOR-CON Take 10 mEq by mouth daily.   sennosides-docusate sodium 8.6-50 MG tablet Commonly known as:  SENOKOT-S Take 2 tablets by mouth 2 (two) times daily.   solifenacin 10 MG tablet Commonly known as:  VESICARE Take 1 tablet (10 mg total) by mouth daily.   TOPROL XL 25 MG 24 hr tablet Generic drug:  metoprolol succinate Take 25 mg by mouth at bedtime.   traMADol 50 MG tablet Commonly known as:  ULTRAM Take 50 mg by mouth every 6 (six) hours as needed.       Review of Systems  Constitutional: Negative for activity  change, appetite change, chills, diaphoresis and fever.  HENT: Negative for congestion, mouth sores, nosebleeds, postnasal drip, sneezing, sore throat, trouble swallowing and voice change.   Respiratory: Negative for apnea, cough, choking, chest tightness, shortness of breath and wheezing.   Cardiovascular: Negative for chest pain, palpitations and leg swelling.  Gastrointestinal: Negative for abdominal distention, abdominal pain, constipation, diarrhea and nausea.  Genitourinary: Negative for difficulty urinating, dysuria, frequency and urgency.  Musculoskeletal: Positive for arthralgias (typical arthritis) and gait problem. Negative for back pain and myalgias.  Skin: Positive for wound. Negative for color change, pallor and rash.  Neurological:  Positive for weakness. Negative for dizziness, tremors, syncope, speech difficulty, numbness and headaches.  Psychiatric/Behavioral: Negative for agitation and behavioral problems.  All other systems reviewed and are negative.   Immunization History  Administered Date(s) Administered  . Influenza Split 05/01/2015  . Influenza-Unspecified 03/13/2016  . Pneumococcal Polysaccharide-23 04/09/2017   Pertinent  Health Maintenance Due  Topic Date Due  . INFLUENZA VACCINE  01/29/2017  . PNA vac Low Risk Adult (2 of 2 - PCV13) 04/09/2018  . DEXA SCAN  Completed   No flowsheet data found. Functional Status Survey:    Vitals:   09/05/17 1315  BP: 112/60  Pulse: 89  Resp: 18  Temp: 98.5 F (36.9 C)  TempSrc: Oral  SpO2: 97%  Weight: 241 lb 4.8 oz (109.5 kg)  Height: 5\' 9"  (1.753 m)   Body mass index is 35.63 kg/m. Physical Exam  Constitutional: She is oriented to person, place, and time. Vital signs are normal. She appears well-developed and well-nourished. She is active and cooperative. She does not appear ill. No distress.  HENT:  Head: Normocephalic and atraumatic.  Mouth/Throat: Uvula is midline, oropharynx is clear and moist and  mucous membranes are normal. Mucous membranes are not pale, not dry and not cyanotic.  Eyes: Pupils are equal, round, and reactive to light. Conjunctivae, EOM and lids are normal.  Neck: Trachea normal, normal range of motion and full passive range of motion without pain. Neck supple. No JVD present. No tracheal deviation, no edema and no erythema present. No thyromegaly present.  Cardiovascular: Normal rate, regular rhythm, normal heart sounds, intact distal pulses and normal pulses. Exam reveals no gallop, no distant heart sounds and no friction rub.  No murmur heard. Pulses:      Dorsalis pedis pulses are 2+ on the right side, and 2+ on the left side.  No edema  Pulmonary/Chest: Effort normal and breath sounds normal. No accessory muscle usage. No respiratory distress. She has no decreased breath sounds. She has no wheezes. She has no rhonchi. She has no rales. She exhibits no tenderness.  Abdominal: Soft. Normal appearance and bowel sounds are normal. She exhibits no distension and no ascites. There is no tenderness.  Musculoskeletal: She exhibits no edema.       Right knee: She exhibits decreased range of motion, swelling and laceration. Tenderness found.  Expected osteoarthritis, stiffness; Bilateral Calves soft, supple. Negative Homan's Sign. B- pedal pulses equal  Neurological: She is alert and oriented to person, place, and time. She has normal strength.  Skin: Skin is warm and dry. Laceration (right knee incision) noted. She is not diaphoretic. No cyanosis. No pallor. Nails show no clubbing.  Psychiatric: She has a normal mood and affect. Her speech is normal and behavior is normal. Judgment and thought content normal. Cognition and memory are normal.  Nursing note and vitals reviewed.   Labs reviewed: Recent Labs    08/20/17 0349 08/21/17 0407 08/22/17 0601  NA 139 134* 134*  K 4.1 4.5 4.2  CL 110 104 105  CO2 24 23 23   GLUCOSE 119* 163* 132*  BUN 15 13 16   CREATININE 0.89  0.96 0.84  CALCIUM 8.7* 9.1 9.2   No results for input(s): AST, ALT, ALKPHOS, BILITOT, PROT, ALBUMIN in the last 8760 hours. Recent Labs    04/09/17 0417  08/20/17 0349 08/21/17 0407 08/22/17 0601  WBC 8.2   < > 5.7 6.7 6.2  NEUTROABS 6.0  --  4.4  --  4.0  HGB 11.5*   < >  12.4 11.9* 10.3*  HCT 33.4*   < > 37.0 36.4 30.4*  MCV 89.6   < > 88.1 88.2 87.0  PLT 153   < > 128* 132* 124*   < > = values in this interval not displayed.   No results found for: TSH No results found for: HGBA1C No results found for: CHOL, HDL, LDLCALC, LDLDIRECT, TRIG, CHOLHDL  Significant Diagnostic Results in last 30 days:  Dg Chest 1 View  Result Date: 08/21/2017 CLINICAL DATA:  Acute onset of fever and generalized weakness. EXAM: Portable CHEST 1 VIEW COMPARISON:  08/08/2017, 04/10/2017, 03/12/2017. FINDINGS: Suboptimal inspiration accounts for crowded bronchovascular markings, especially in the bases, and accentuates the cardiac silhouette. Taking this into account, cardiac silhouette mildly enlarged, unchanged. Right upper lobe scarring, unchanged. Lungs otherwise clear. No localized airspace consolidation. No pleural effusions. No pneumothorax. Normal pulmonary vascularity. Incidental note of calcification involving the inferior labrum of the left shoulder joint. IMPRESSION: 1. Suboptimal inspiration.  No acute cardiopulmonary disease. 2. Stable mild cardiomegaly without pulmonary edema. Stable right upper lobe scarring. Electronically Signed   By: Evangeline Dakin M.D.   On: 08/21/2017 19:03   Dg Chest 2 View  Result Date: 08/08/2017 CLINICAL DATA:  preop for knee surgery next week, non smoker, diabetic, no known heart/lung disease EXAM: CHEST  2 VIEW COMPARISON:  Chest x-ray dated 04/10/2017. FINDINGS: Heart size is upper normal, stable. Overall cardiomediastinal silhouette is stable. Atherosclerotic changes noted at the aortic arch. Lungs are clear.  No pleural effusion. Degenerative changes throughout the  kyphotic thoracic spine, mild to moderate in degree. No acute or suspicious osseous finding. IMPRESSION: 1. No active cardiopulmonary disease. No evidence of pneumonia or pulmonary edema. 2. Heart size is upper normal, stable. 3. Aortic atherosclerosis. Electronically Signed   By: Franki Cabot M.D.   On: 08/08/2017 14:55   US Venous Img Lower Unilateral Right  Result Date: 09/03/2017 CLINICAL DATA:  RIGHT knee prosthesis, RIGHT leg pain and swelling since knee prosthesis placement on 08/19/2017 EXAM: RIGHT LOWER EXTREMITY VENOUS DOPPLER ULTRASOUND TECHNIQUE: Gray-scale sonography with graded compression, as well as color Doppler and duplex ultrasound were performed to evaluate the lower extremity deep venous systems from the level of the common femoral vein and including the common femoral, femoral, profunda femoral, popliteal and calf veins including the posterior tibial, peroneal and gastrocnemius veins when visible. The superficial great saphenous vein was also interrogated. Spectral Doppler was utilized to evaluate flow at rest and with distal augmentation maneuvers in the common femoral, femoral and popliteal veins. COMPARISON:  03/19/2017 FINDINGS: Contralateral Common Femoral Vein: Respiratory phasicity is normal and symmetric with the symptomatic side. No evidence of thrombus. Normal compressibility. Common Femoral Vein: No evidence of thrombus. Normal compressibility, respiratory phasicity and response to augmentation. Saphenofemoral Junction: No evidence of thrombus. Normal compressibility and flow on color Doppler imaging. Profunda Femoral Vein: No evidence of thrombus. Normal compressibility and flow on color Doppler imaging. Femoral Vein: No evidence of thrombus. Normal compressibility, respiratory phasicity and response to augmentation. Popliteal Vein: No evidence of thrombus. Normal compressibility, respiratory phasicity and response to augmentation. Calf Veins: Limited visualization of calf veins  due to soft tissue swelling. Superficial Great Saphenous Vein: No evidence of thrombus. Normal compressibility. Venous Reflux:  None. Other Findings:  Subcutaneous edema at the RIGHT calf IMPRESSION: No evidence of deep venous thrombosis in the RIGHT lower extremity. Electronically Signed   By: Lavonia Dana M.D.   On: 09/03/2017 15:51   Dg Knee Right Port  Result Date: 08/19/2017 CLINICAL DATA:  Status post right knee replacement today. EXAM: PORTABLE RIGHT KNEE - 1-2 VIEW COMPARISON:  None. FINDINGS: Right total knee arthroplasty is in place. Surgical drain and staples are noted. There is some gas in the soft tissues from surgery. No acute abnormality. IMPRESSION: Status post right knee replacement.  No acute finding. Electronically Signed   By: Inge Rise M.D.   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, right  Continue PT/OT  Continue exercises as taught by PT/OT  Continuous polar care to the knee  Thigh High TEDs- on in the early am, off in the pm  Skin care per protocol  Continue Tramadol 50 mg po Q 6 hours prn pain  Continue Oxycodone 5 mg 1-2 tablets po Q 4 hours prn pain  Continue APAP 650 mg po QID scheduled for pain  Continue Robaxin 500 mg po TID scheduled for pain, spasm  Continue Lovenox 40 mg SQ Q Day for dvt prophylaxis  Follow up with Orthopedist as instructed  Family/ staff Communication:   Total Time:  Documentation:  Face to Face:  Family/Phone:   Labs/tests ordered:    Medication list reviewed and assessed for continued appropriateness. Monthly medication orders reviewed and signed.  Vikki Ports, NP-C Geriatrics [facility] [phone], [place] [address] Cell Phone (Mon-Fri 8am-5pm):  [phone] On Call:  [phone] & follow prompts after 5pm & weekends Office Phone:  [phone] Office Fax:   [phone]

## 2017-09-08 ENCOUNTER — Ambulatory Visit: Payer: Medicare Other | Admitting: Urology

## 2017-09-09 ENCOUNTER — Encounter: Payer: Self-pay | Admitting: Urology

## 2017-09-30 ENCOUNTER — Ambulatory Visit (INDEPENDENT_AMBULATORY_CARE_PROVIDER_SITE_OTHER): Payer: Medicare Other | Admitting: Vascular Surgery

## 2017-10-14 NOTE — Progress Notes (Signed)
Location:      Place of Service:  SNF (31) Provider:  Toni Arthurs, NP-C  Glendon Axe, MD  Patient Care Team: Glendon Axe, MD as PCP - General (Internal Medicine)  Extended Emergency Contact Information Primary Emergency Contact: [facility] Address: [address]          [place], [place] [address] Johnnette Litter of [place] Phone: [phone] Mobile Phone: [phone] Relation: Daughter Secondary Emergency Contact: [facility] [place], [place] [address] Johnnette Litter of [place] Phone: [phone] Relation: Son  Code Status:  FULL Goals of care: Advanced Directive information Advanced Directives 09/05/2017  Does Patient Have a Medical Advance Directive? No  Would patient like information on creating a medical advance directive? No - Patient declined     Chief Complaint  Patient presents with  . Medical Management of Chronic Issues    HPI:  Pt is a 77 y.o. female seen today for medical management of chronic diseases. Pt was admitted to the facility for rehab following hospitalization for Right Total Knee replacement. Pt has been participating in PT/OT. Pt has been slowly progressing. Pt is having some pain control issues. He does c/o edema to the RLE with medial knee bruising and lateral edema. Thigh Teds are in place. Incision well approximated. Staples in place, no redness, no drainage, no warmth. Calves soft, supple. Negative Homan's sign. Pt reports her appetite is good and voiding well. C/O some constipation today, but staff is giving prn protocol laxatives. VSS. No other complaints.       Past Medical History:  Diagnosis Date  . Acquired hammertoe    other  . Acquired hammertoe   . Angina pectoris, unspecified ([place])   . Angina pectoris, unspecified ([place])   . Arthritis    knees  . Cancer ([place]) 1990,s   left breast  . Degenerative joint disease   . Dermatophytosis of nail   . Diabetes mellitus type 2, uncomplicated ([place])   .  Essential hypertension, benign   . Exostosis    unspecified site  . Fracture of right ankle 2010   Followed by Dr. Vickki Muff  . Hypercholesterolemia   . Hyperlipidemia, unspecified   . Hypertension   . Lymphedema of leg    left leg worse than right leg.  . Obesity, unspecified   . Osteoarthrosis involving lower leg    unspecified whether generalized or localized   . Pre-diabetes   . Pure hypercholesterolemia   . Vitamin D deficiency, unspecified    Past Surgical History:  Procedure Laterality Date  . BREAST SURGERY Left 1990   lumpectomy  . BUNIONECTOMY Bilateral 1990's   plus hammer toe repair on left foot.  . ENDOMETRIAL BIOPSY    . FOOT SURGERY Left    for bunionectomy and hammertoe  . JOINT REPLACEMENT    . TONSILLECTOMY    . TOTAL KNEE ARTHROPLASTY Left 04/08/2017   Procedure: TOTAL KNEE ARTHROPLASTY;  Surgeon: Corky Mull, MD;  Location: [facility] ORS;  Service: Orthopedics;  Laterality: Left;  . TOTAL KNEE ARTHROPLASTY Right 08/19/2017   Procedure: TOTAL KNEE ARTHROPLASTY;  Surgeon: Corky Mull, MD;  Location: [facility] ORS;  Service: Orthopedics;  Laterality: Right;    Allergies  Allergen Reactions  . Colesevelam Other (See Comments)    constipation  . Oxybutynin Swelling    Leg swelling  . Rosuvastatin Other (See Comments)    Pain in lower extremities    Allergies as of 08/25/2017  Reactions   Colesevelam Other (See Comments)   constipation   Oxybutynin Swelling   Leg swelling   Rosuvastatin Other (See Comments)   Pain in lower extremities      Medication List        Accurate as of 08/25/17 11:59 PM. Always use your most recent med list.          acetaminophen 500 MG tablet Commonly known as:  TYLENOL Take 500 mg by mouth every 8 (eight) hours as needed for mild pain (takes 1 dose every morning with breakfast).   aspirin EC 81 MG tablet Take 81 mg by mouth daily.   atorvastatin 20 MG tablet Commonly known as:  LIPITOR Take 20 mg by mouth at  bedtime.   enoxaparin 40 MG/0.4ML injection Commonly known as:  LOVENOX Inject 0.4 mLs (40 mg total) into the skin daily.   furosemide 40 MG tablet Commonly known as:  LASIX Take one tablet by mouth once daily as needed for lower extremity edema   gabapentin 300 MG capsule Commonly known as:  NEURONTIN Take 300 mg by mouth at bedtime.   losartan 25 MG tablet Commonly known as:  COZAAR Take 25 mg by mouth daily.   magnesium oxide 400 MG tablet Commonly known as:  MAG-OX Take 400 mg by mouth daily.   nystatin cream Commonly known as:  MYCOSTATIN Apply 1 application topically 2 (two) times daily as needed. Apply thin film to yeast rash under the breasts   Omega-3 Krill Oil 500 MG Caps Take 500 mg by mouth 3 (three) times a week. On Monday Wednesday and Friday   omeprazole 20 MG capsule Commonly known as:  PRILOSEC Take 20 mg by mouth daily. Acid reflux   oxyCODONE 5 MG immediate release tablet Commonly known as:  Oxy IR/ROXICODONE Take 1-2 tablets (5-10 mg total) by mouth every 4 (four) hours as needed for moderate pain.   potassium chloride 10 MEQ tablet Commonly known as:  K-DUR,KLOR-CON Take 10 mEq by mouth daily.   sennosides-docusate sodium 8.6-50 MG tablet Commonly known as:  SENOKOT-S Take 2 tablets by mouth 2 (two) times daily.   solifenacin 10 MG tablet Commonly known as:  VESICARE Take 1 tablet (10 mg total) by mouth daily.   TOPROL XL 25 MG 24 hr tablet Generic drug:  metoprolol succinate Take 25 mg by mouth at bedtime.   torsemide 20 MG tablet Commonly known as:  DEMADEX Take 20 mg by mouth daily as needed (fluid).   traMADol 50 MG tablet Commonly known as:  ULTRAM Take 1-2 tablets (50-100 mg total) by mouth every 6 (six) hours as needed for severe pain.   VOLTAREN 1 % Gel Generic drug:  diclofenac sodium Apply 1 application topically 3 (three) times daily as needed for pain.       Review of Systems  Constitutional: Negative for activity  change, appetite change, chills, diaphoresis and fever.  HENT: Negative for congestion, mouth sores, nosebleeds, postnasal drip, sneezing, sore throat, trouble swallowing and voice change.   Respiratory: Negative for apnea, cough, choking, chest tightness, shortness of breath and wheezing.   Cardiovascular: Negative for chest pain, palpitations and leg swelling.  Gastrointestinal: Negative for abdominal distention, abdominal pain, constipation, diarrhea and nausea.  Genitourinary: Negative for difficulty urinating, dysuria, frequency and urgency.  Musculoskeletal: Positive for arthralgias (typical arthritis) and gait problem. Negative for back pain and myalgias.  Skin: Positive for wound. Negative for color change, pallor and rash.  Neurological: Positive for weakness. Negative  for dizziness, tremors, syncope, speech difficulty, numbness and headaches.  Psychiatric/Behavioral: Negative for agitation and behavioral problems.  All other systems reviewed and are negative.   Immunization History  Administered Date(s) Administered  . Influenza Split 05/01/2015  . Influenza-Unspecified 03/13/2016  . Pneumococcal Polysaccharide-23 04/09/2017   Pertinent  Health Maintenance Due  Topic Date Due  . INFLUENZA VACCINE  01/29/2018  . PNA vac Low Risk Adult (2 of 2 - PCV13) 04/09/2018  . DEXA SCAN  Completed   No flowsheet data found. Functional Status Survey:    Vitals:   08/25/17 0545  BP: (!) 130/56  Pulse: 63  Resp: 20  Temp: 98.6 F (37 C)  SpO2: 97%   There is no height or weight on file to calculate BMI. Physical Exam  Constitutional: She is oriented to person, place, and time. Vital signs are normal. She appears well-developed and well-nourished. She is active and cooperative. She does not appear ill. No distress.  HENT:  Head: Normocephalic and atraumatic.  Mouth/Throat: Uvula is midline, oropharynx is clear and moist and mucous membranes are normal. Mucous membranes are not  pale, not dry and not cyanotic.  Eyes: Pupils are equal, round, and reactive to light. Conjunctivae, EOM and lids are normal.  Neck: Trachea normal, normal range of motion and full passive range of motion without pain. Neck supple. No JVD present. No tracheal deviation, no edema and no erythema present. No thyromegaly present.  Cardiovascular: Normal rate, regular rhythm, normal heart sounds, intact distal pulses and normal pulses. Exam reveals no gallop, no distant heart sounds and no friction rub.  No murmur heard. Pulses:      Dorsalis pedis pulses are 2+ on the right side, and 2+ on the left side.  No edema  Pulmonary/Chest: Effort normal and breath sounds normal. No accessory muscle usage. No respiratory distress. She has no decreased breath sounds. She has no wheezes. She has no rhonchi. She has no rales. She exhibits no tenderness.  Abdominal: Soft. Normal appearance and bowel sounds are normal. She exhibits no distension and no ascites. There is no tenderness.  Musculoskeletal: She exhibits no edema.       Right knee: She exhibits decreased range of motion, swelling and ecchymosis. Tenderness found.  Expected osteoarthritis, stiffness; Bilateral Calves soft, supple. Negative Homan's Sign. B- pedal pulses equal  Neurological: She is alert and oriented to person, place, and time. She has normal strength. Coordination abnormal.  Skin: Skin is warm and dry. Laceration noted. She is not diaphoretic. No cyanosis. No pallor. Nails show no clubbing.  Psychiatric: She has a normal mood and affect. Her speech is normal and behavior is normal. Judgment and thought content normal. Cognition and memory are normal.  Nursing note and vitals reviewed.   Labs reviewed: Recent Labs    08/20/17 0349 08/21/17 0407 08/22/17 0601  NA 139 134* 134*  K 4.1 4.5 4.2  CL 110 104 105  CO2 24 23 23   GLUCOSE 119* 163* 132*  BUN 15 13 16   CREATININE 0.89 0.96 0.84  CALCIUM 8.7* 9.1 9.2   No results for  input(s): AST, ALT, ALKPHOS, BILITOT, PROT, ALBUMIN in the last 8760 hours. Recent Labs    04/09/17 0417  08/20/17 0349 08/21/17 0407 08/22/17 0601  WBC 8.2   < > 5.7 6.7 6.2  NEUTROABS 6.0  --  4.4  --  4.0  HGB 11.5*   < > 12.4 11.9* 10.3*  HCT 33.4*   < > 37.0 36.4 30.4*  MCV 89.6   < > 88.1 88.2 87.0  PLT 153   < > 128* 132* 124*   < > = values in this interval not displayed.   No results found for: TSH No results found for: HGBA1C No results found for: CHOL, HDL, LDLCALC, LDLDIRECT, TRIG, CHOLHDL  Significant Diagnostic Results in last 30 days:  No results found.  Assessment/Plan Juliza was seen today for medical management of chronic issues.  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, right   Continue PT/OT  Continue exercises as taught by PT/OT  Ice pack/ Polar care at all times to the RLE  Doppler to RLE  Continue Tramadol 50 mg po Q 6 hours prn pain  Continue Oxycodone 5 mg 1-2 tablets po Q 4 hours prn pain  Continue Tylenol 650 mg po QID scheduled for pain  Continue Robaxin 500 mg po Q 6 horn prn for pain  Continue Lovenox 40 mg SQ Q Day for DVT prophylaxis  Skin care per protocol  Follow up with orthopedist as instructed  Family/ staff Communication:   Total Time:  Documentation:  Face to Face:  Family/Phone:   Labs/tests ordered:  Recent labs reviewed, RLE venous duplex doppler  Medication list reviewed and assessed for continued appropriateness. Monthly medication orders reviewed and signed.  Vikki Ports, NP-C Geriatrics [facility] [phone], [place] [address] Cell Phone (Mon-Fri 8am-5pm):  [phone] On Call:  [phone] & follow prompts after 5pm & weekends Office Phone:  [phone] Office Fax:  [phone]

## 2017-10-29 ENCOUNTER — Ambulatory Visit (INDEPENDENT_AMBULATORY_CARE_PROVIDER_SITE_OTHER): Payer: Medicare Other | Admitting: Vascular Surgery

## 2017-11-03 ENCOUNTER — Ambulatory Visit (INDEPENDENT_AMBULATORY_CARE_PROVIDER_SITE_OTHER): Payer: Medicare Other | Admitting: Vascular Surgery

## 2017-11-03 ENCOUNTER — Encounter (INDEPENDENT_AMBULATORY_CARE_PROVIDER_SITE_OTHER): Payer: Self-pay | Admitting: Vascular Surgery

## 2017-11-03 VITALS — BP 125/71 | HR 70 | Resp 16 | Ht 71.0 in | Wt 227.0 lb

## 2017-11-03 DIAGNOSIS — Z96652 Presence of left artificial knee joint: Secondary | ICD-10-CM

## 2017-11-03 DIAGNOSIS — I890 Lymphedema, not elsewhere classified: Secondary | ICD-10-CM | POA: Diagnosis not present

## 2017-11-03 DIAGNOSIS — Z96651 Presence of right artificial knee joint: Secondary | ICD-10-CM | POA: Diagnosis not present

## 2017-11-03 NOTE — Progress Notes (Signed)
Subjective:    Patient ID: [name], female    DOB: 09-03-1940, 77 y.o.   MRN: [id number] Chief Complaint  Patient presents with  . Follow-up    60month follow up   The patient presents for a 57-month lymphedema follow-up.  Since our last visit, the patient now has a lymphedema pump however she has not been using it.  The patient is status post bilateral total knee replacement (03/2017 & 08/2017) and felt the pressure from the lymphedema pump was too much after her surgery.  The patient does engage in conservative therapy including wearing medical grade 1 compression socks and elevating her legs.  The patient notes that conservative therapy alone does do a good job at controlling her edema.  The patient denies any claudication-like symptoms, rest pain or ulceration to the bilateral lower extremity.  The patient denies any worsening in her lower extremity edema.  The patient is healing well status post bilateral total knee replacements.  The patient denies any fever, nausea vomiting.  Review of Systems  Constitutional: Negative.   HENT: Negative.   Eyes: Negative.   Respiratory: Negative.   Cardiovascular:       Lymphedema  Gastrointestinal: Negative.   Endocrine: Negative.   Genitourinary: Negative.   Musculoskeletal: Negative.   Skin: Negative.   Allergic/Immunologic: Negative.   Neurological: Negative.   Hematological: Negative.   Psychiatric/Behavioral: Negative.       Objective:   Physical Exam  Constitutional: She is oriented to person, place, and time. She appears well-developed and well-nourished. No distress.  HENT:  Head: Normocephalic and atraumatic.  Right Ear: External ear normal.  Left Ear: External ear normal.  Eyes: Pupils are equal, round, and reactive to light. Conjunctivae and EOM are normal.  Neck: Normal range of motion.  Cardiovascular: Normal rate, regular rhythm, normal heart sounds and intact distal pulses.  Pulmonary/Chest: Effort normal and breath  sounds normal.  Musculoskeletal: Normal range of motion. She exhibits edema (Mild to moderate edema noted primarily around the bilateral ankles).  Neurological: She is alert and oriented to person, place, and time.  Skin: Skin is warm and dry. She is not diaphoretic.  Psychiatric: She has a normal mood and affect. Her behavior is normal. Judgment and thought content normal.  Vitals reviewed.  BP 125/71 (BP Location: Right Arm)   Pulse 70   Resp 16   Ht 5\' 11"  (1.803 m)   Wt 227 lb (103 kg)   BMI 31.66 kg/m   Past Medical History:  Diagnosis Date  . Acquired hammertoe    other  . Acquired hammertoe   . Angina pectoris, unspecified ([place])   . Angina pectoris, unspecified ([place])   . Arthritis    knees  . Cancer ([place]) 1990,s   left breast  . Degenerative joint disease   . Dermatophytosis of nail   . Diabetes mellitus type 2, uncomplicated ([place])   . Essential hypertension, benign   . Exostosis    unspecified site  . Fracture of right ankle 2010   Followed by Dr. Vickki Muff  . Hypercholesterolemia   . Hyperlipidemia, unspecified   . Hypertension   . Lymphedema of leg    left leg worse than right leg.  . Obesity, unspecified   . Osteoarthrosis involving lower leg    unspecified whether generalized or localized   . Pre-diabetes   . Pure hypercholesterolemia   . Vitamin D deficiency, unspecified    Social History   Socioeconomic History  . Marital  status: Divorced    Spouse name: Not on file  . Number of children: 2  . Years of education: Not on file  . Highest education level: Not on file  Occupational History  . Not on file  Social Needs  . Financial resource strain: Not on file  . Food insecurity:    Worry: Not on file    Inability: Not on file  . Transportation needs:    Medical: Not on file    Non-medical: Not on file  Tobacco Use  . Smoking status: Never Smoker  . Smokeless tobacco: Never Used  Substance and Sexual Activity  . Alcohol use: Yes    Comment:  1-2 drinks every 1-2 months  . Drug use: No  . Sexual activity: Not on file  Lifestyle  . Physical activity:    Days per week: Not on file    Minutes per session: Not on file  . Stress: Not on file  Relationships  . Social connections:    Talks on phone: Not on file    Gets together: Not on file    Attends religious service: Not on file    Active member of club or organization: Not on file    Attends meetings of clubs or organizations: Not on file    Relationship status: Not on file  . Intimate partner violence:    Fear of current or ex partner: Not on file    Emotionally abused: Not on file    Physically abused: Not on file    Forced sexual activity: Not on file  Other Topics Concern  . Not on file  Social History Narrative  . Not on file   Past Surgical History:  Procedure Laterality Date  . BREAST SURGERY Left 1990   lumpectomy  . BUNIONECTOMY Bilateral 1990's   plus hammer toe repair on left foot.  . ENDOMETRIAL BIOPSY    . FOOT SURGERY Left    for bunionectomy and hammertoe  . JOINT REPLACEMENT    . TONSILLECTOMY    . TOTAL KNEE ARTHROPLASTY Left 04/08/2017   Procedure: TOTAL KNEE ARTHROPLASTY;  Surgeon: Corky Mull, MD;  Location: [facility] ORS;  Service: Orthopedics;  Laterality: Left;  . TOTAL KNEE ARTHROPLASTY Right 08/19/2017   Procedure: TOTAL KNEE ARTHROPLASTY;  Surgeon: Corky Mull, MD;  Location: [facility] ORS;  Service: Orthopedics;  Laterality: Right;   Family History  Problem Relation Age of Onset  . Stroke Mother   . Hypertension Mother   . Cancer Father   . Kidney cancer Neg Hx   . Kidney disease Neg Hx   . Prostate cancer Neg Hx    Allergies  Allergen Reactions  . Colesevelam Other (See Comments)    constipation  . Oxybutynin Swelling    Leg swelling  . Rosuvastatin Other (See Comments)    Pain in lower extremities      Assessment & Plan:  The patient presents for a 31-month lymphedema follow-up.  Since our last visit, the patient now has a  lymphedema pump however she has not been using it.  The patient is status post bilateral total knee replacement (03/2017 & 08/2017) and felt the pressure from the lymphedema pump was too much after her surgery.  The patient does engage in conservative therapy including wearing medical grade 1 compression socks and elevating her legs.  The patient notes that conservative therapy alone does do a good job at controlling her edema.  The patient denies any claudication-like symptoms, rest pain  or ulceration to the bilateral lower extremity.  The patient denies any worsening in her lower extremity edema.  The patient is healing well status post bilateral total knee replacements.  The patient denies any fever, nausea vomiting.  1. Lymphedema - Stable The patient will start to try to use her lymphedema pump again. If she feels that her lymphedema pump is squeezing at too high of an intensity I recommend she call the company and have them come to decrease the pressure. If the patient is unable to contact the company for this she should call our office and we can help her. She should continue engaging in conservative therapy and slowly add the lymphedema pump therapy at least daily in the evening for an hour. The combined therapy should really improve the patient's lower extremity edema The patient is to follow-up in 6 months to assess her progress The patient was instructed to call the office in the interim if any worsening edema or ulcerations to the legs, feet or toes occurs. The patient expresses their understanding.  2. Status post total knee replacement using cement, left - Stable The patient is healing well from this. Contributing factor to the patient's lower extremity edema  3. Status post total knee replacement using cement, right - Stable The patient is healing well from this. Contributing factor to the patient's lower extremity edema  Current Outpatient Medications on File Prior to Visit    Medication Sig Dispense Refill  . acetaminophen (TYLENOL) 325 MG tablet Take 650 mg by mouth 4 (four) times daily.    Marland Kitchen aspirin EC 81 MG tablet Take 81 mg by mouth daily.    Marland Kitchen atorvastatin (LIPITOR) 20 MG tablet Take 20 mg by mouth at bedtime.     . enoxaparin (LOVENOX) 40 MG/0.4ML injection Inject 0.4 mLs (40 mg total) into the skin daily. 14 Syringe 0  . furosemide (LASIX) 40 MG tablet Take one tablet by mouth once daily as needed for lower extremity edema    . gabapentin (NEURONTIN) 300 MG capsule Take 300 mg by mouth at bedtime.    Marland Kitchen lactulose (CHRONULAC) 10 GM/15ML solution Take 30 g by mouth every 12 (twelve) hours as needed.    Marland Kitchen losartan (COZAAR) 25 MG tablet Take 25 mg by mouth daily.     . magnesium hydroxide (MILK OF MAGNESIA) 400 MG/5ML suspension Take 30 mLs by mouth 2 (two) times daily as needed.    . magnesium oxide (MAG-OX) 400 MG tablet Take 400 mg by mouth daily.    . methocarbamol (ROBAXIN) 500 MG tablet Take 500 mg by mouth 3 (three) times daily.    Marland Kitchen nystatin cream (MYCOSTATIN) Apply 1 application topically 2 (two) times daily as needed. Apply thin film to yeast rash under the breasts     . Omega-3 Krill Oil 500 MG CAPS Take 500 mg by mouth 3 (three) times a week. On Monday Wednesday and Friday    . omeprazole (PRILOSEC) 20 MG capsule Take 20 mg by mouth daily. Acid reflux     . potassium chloride (K-DUR,KLOR-CON) 10 MEQ tablet Take 10 mEq by mouth daily.     . sennosides-docusate sodium (SENOKOT-S) 8.6-50 MG tablet Take 2 tablets by mouth 2 (two) times daily.     . solifenacin (VESICARE) 10 MG tablet Take 1 tablet (10 mg total) by mouth daily. 90 tablet 3  . TOPROL XL 25 MG 24 hr tablet Take 25 mg by mouth at bedtime.     . traMADol Veatrice Bourbon)  50 MG tablet Take 50 mg by mouth every 6 (six) hours as needed.    Marland Kitchen oxyCODONE (OXY IR/ROXICODONE) 5 MG immediate release tablet Take 1-2 tablets (5-10 mg total) by mouth every 4 (four) hours as needed for moderate pain. (Patient not  taking: Reported on 11/03/2017) 120 tablet 0   No current facility-administered medications on file prior to visit.    There are no Patient Instructions on file for this visit. No follow-ups on file.  Gaelan Glennon A Makeba Delcastillo, PA-C

## 2017-11-06 IMAGING — DX DG CHEST 1V PORT
1 series · 1 of 1 positions shown · non-contrast
Comparison: Chest x-ray of March 12, 2017

CLINICAL DATA: Two days of fever, history of left breast
malignancy. Nonsmoker.

EXAM:
PORTABLE CHEST 1 VIEW

[chest ap]
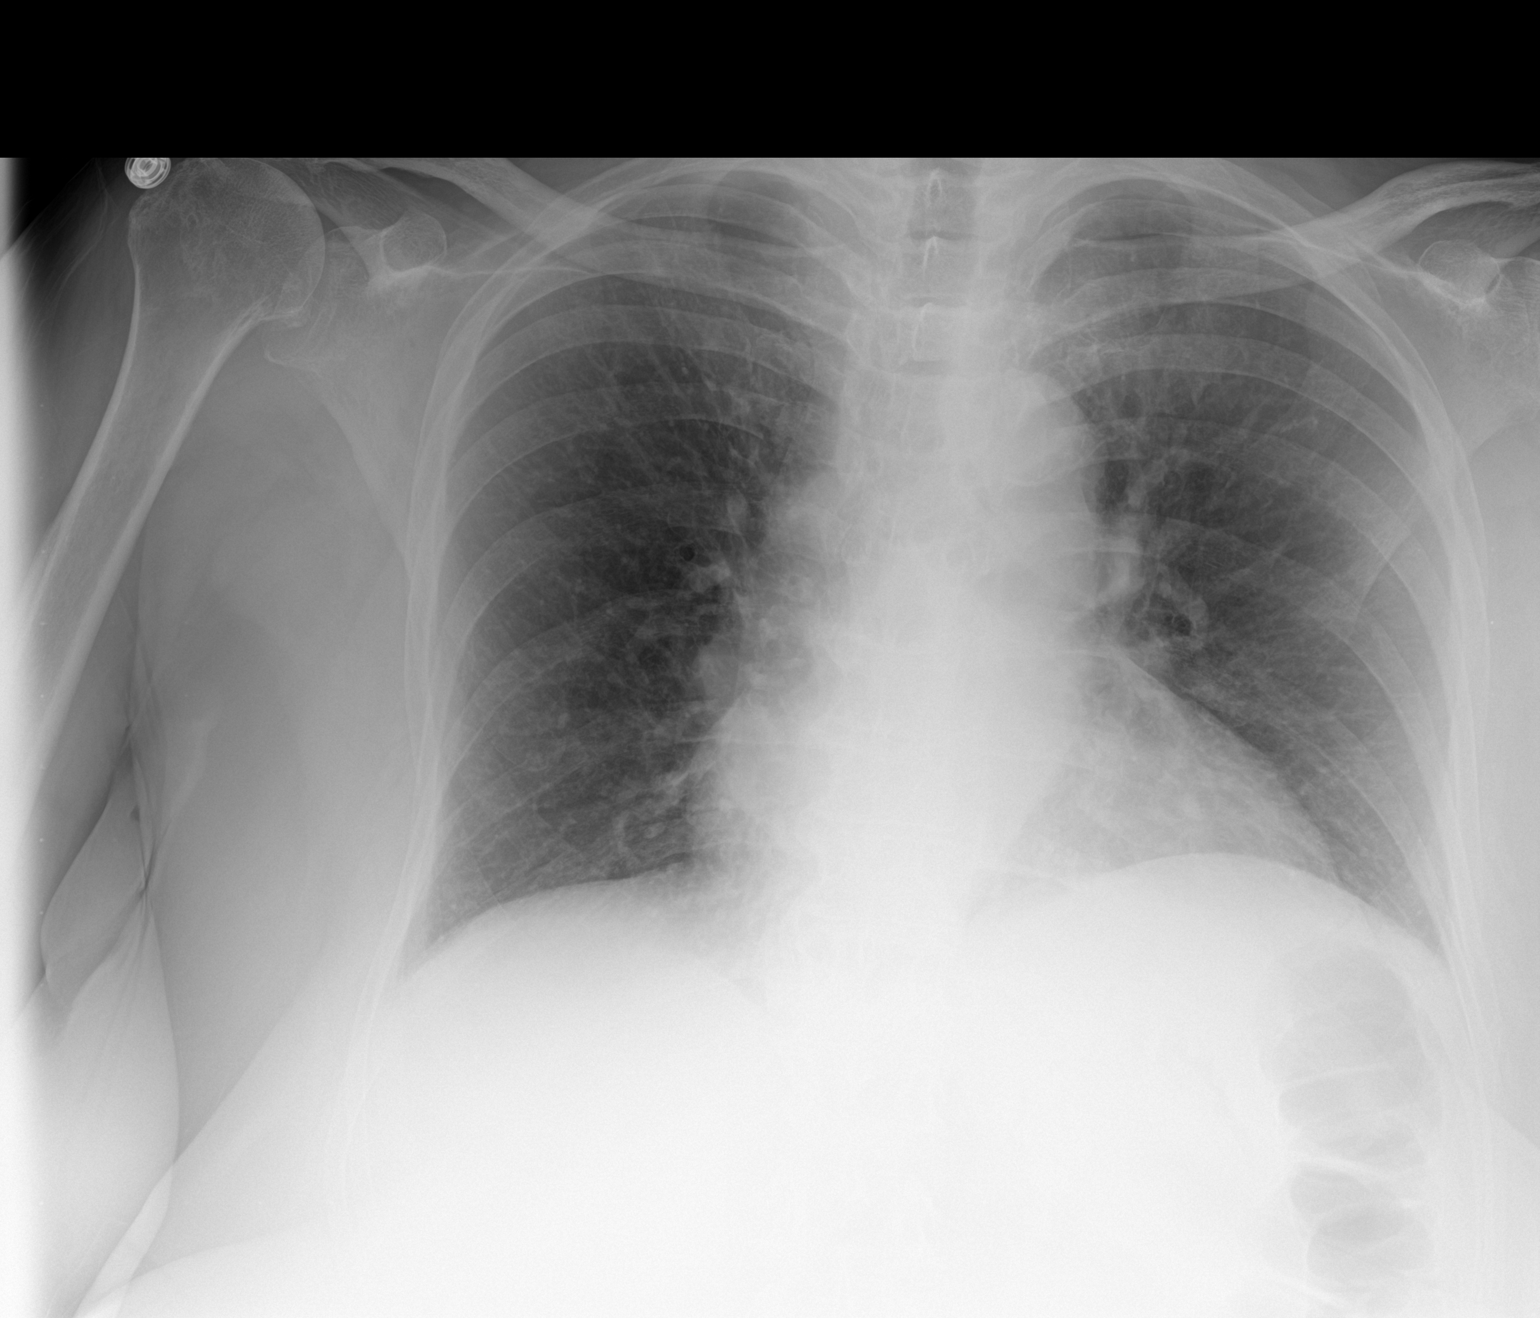

[1 of 1 positions shown; findings below may reference images not displayed]

FINDINGS: The lungs are adequately inflated. There is no focal infiltrate.
There is no pleural effusion. The cardiac silhouette is mildly
enlarged. The pulmonary vascularity is normal. There is
calcification in the wall of the aortic arch in there is tortuosity
of the descending thoracic aorta. The observed bony thorax exhibits
no acute abnormality.
IMPRESSION: There is no active cardiopulmonary disease.

Thoracic aortic atherosclerosis.

## 2017-12-10 DIAGNOSIS — M5416 Radiculopathy, lumbar region: Secondary | ICD-10-CM | POA: Insufficient documentation

## 2017-12-11 DIAGNOSIS — J302 Other seasonal allergic rhinitis: Secondary | ICD-10-CM | POA: Insufficient documentation

## 2018-02-24 IMAGING — US US EXTREM LOW VENOUS*L*
1 series · 13 of 24 positions shown · non-contrast
Comparison: None.

CLINICAL DATA: Left leg pain for 1 day. Status post left knee
replacement on 04/08/2017.



[Series 1: us extrem low venous*left* · 0.07mm/px · 13 of 35 slices shown]
[im 1/35]
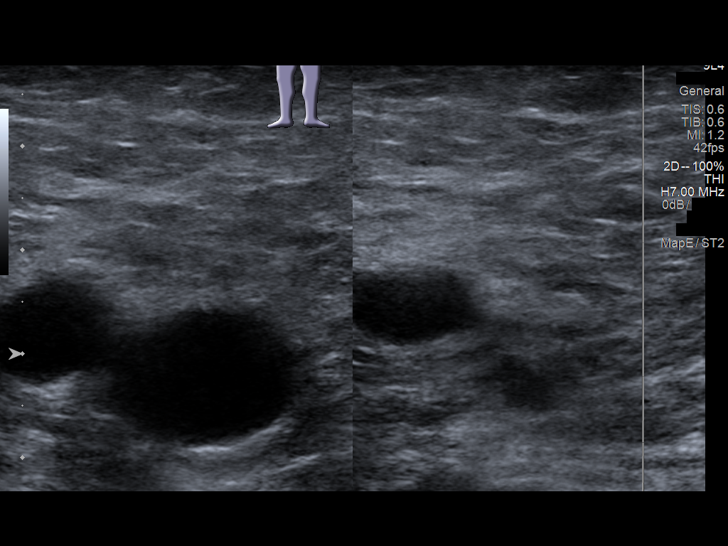
[im 3/35]
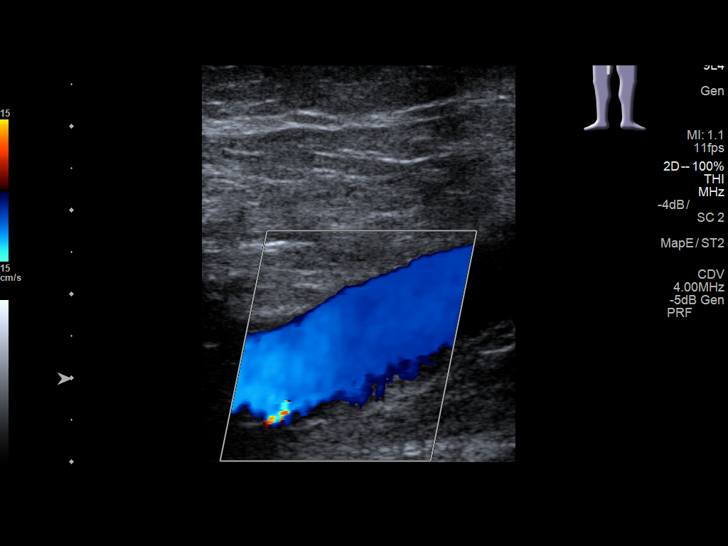
[im 6/35]
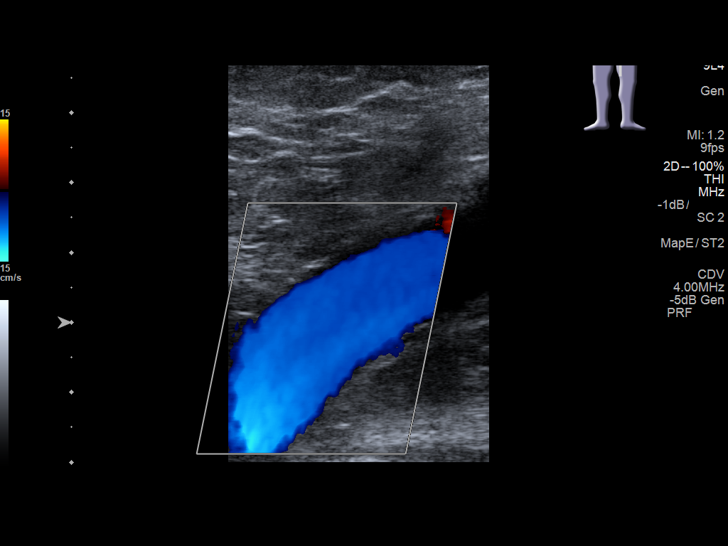
[im 9/35]
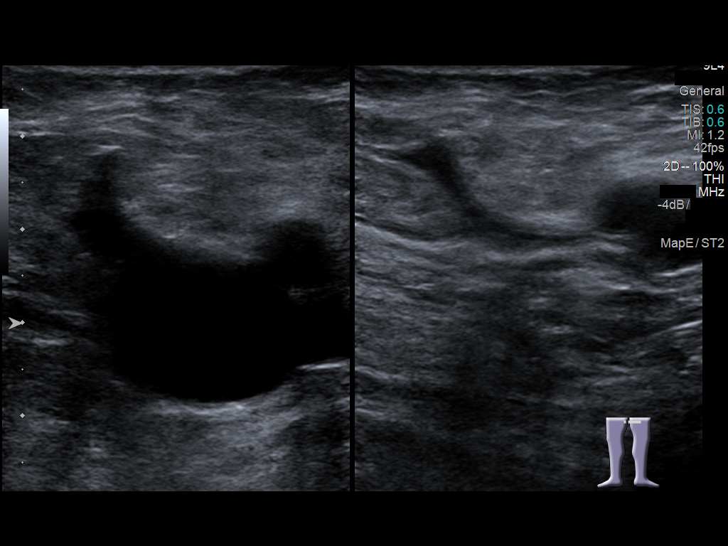
[im 12/35]
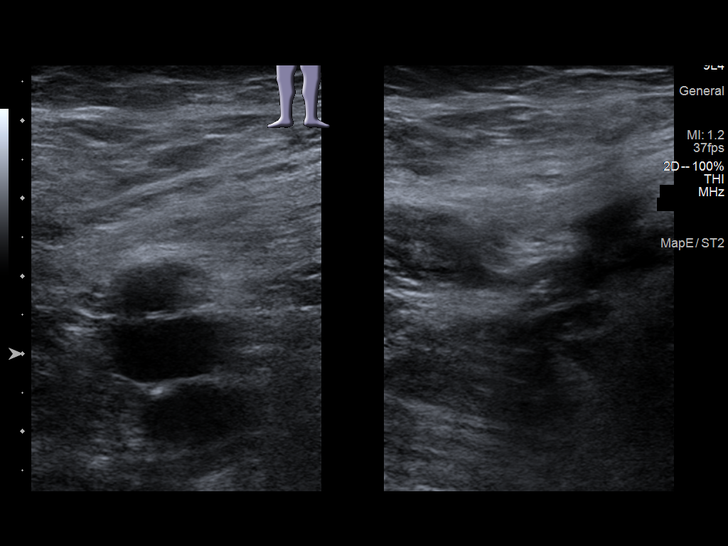
[im 15/35]
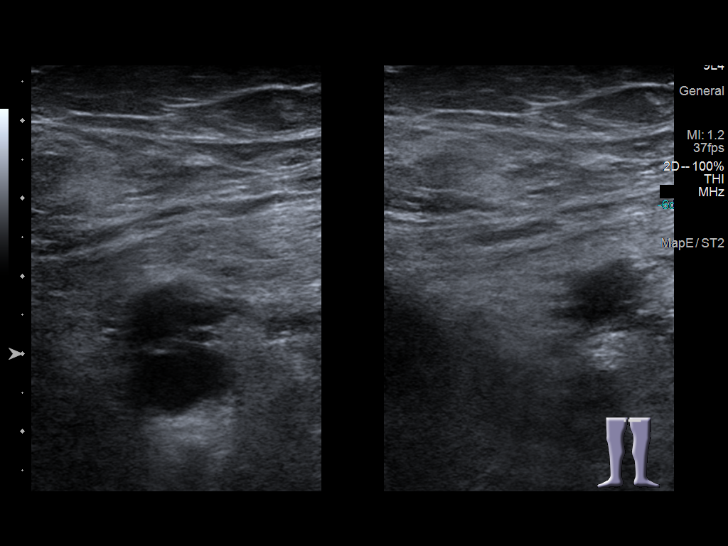
[im 18/35]
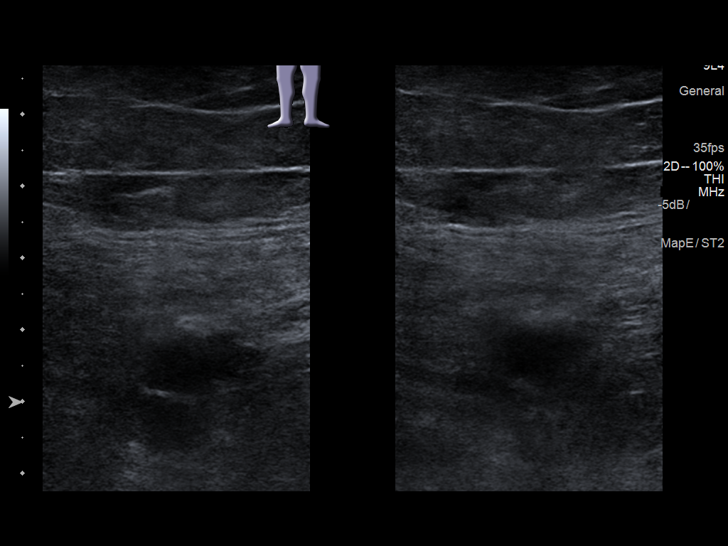
[im 20/35]
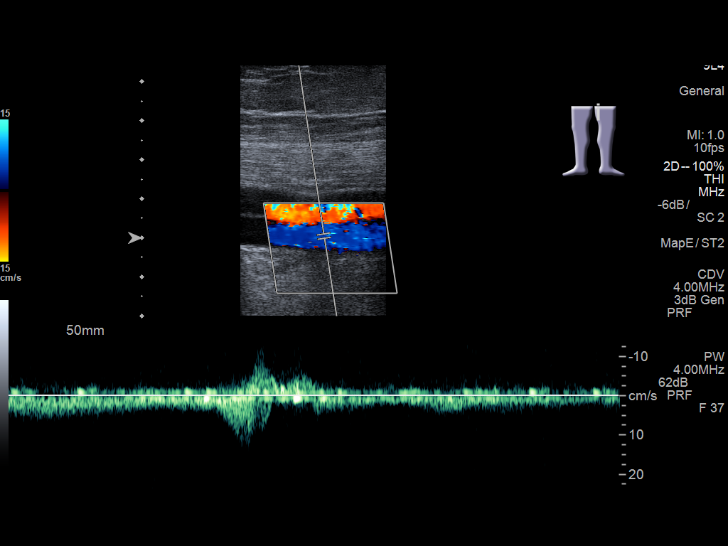
[im 23/35]
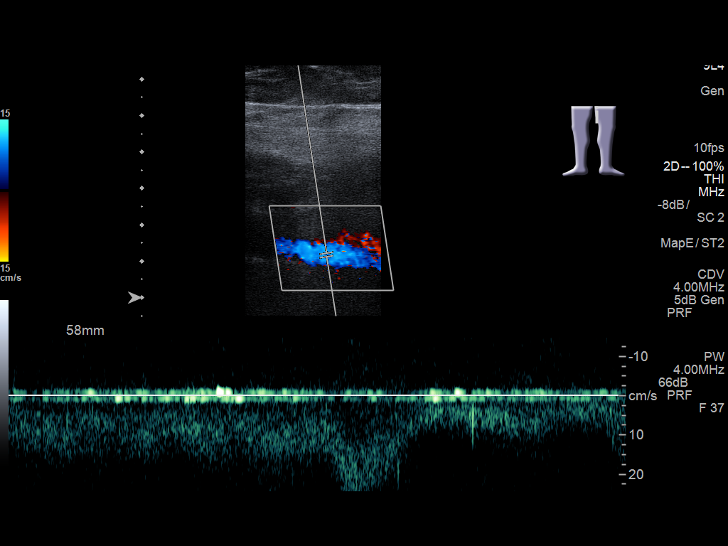
[im 26/35]
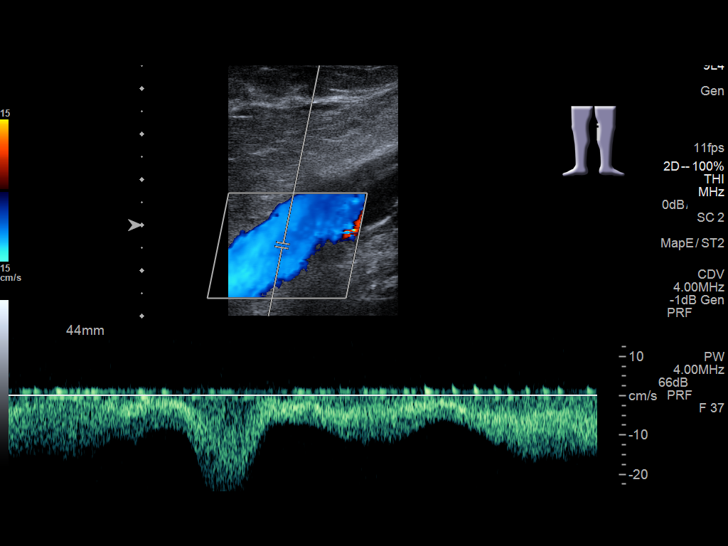
[im 29/35]
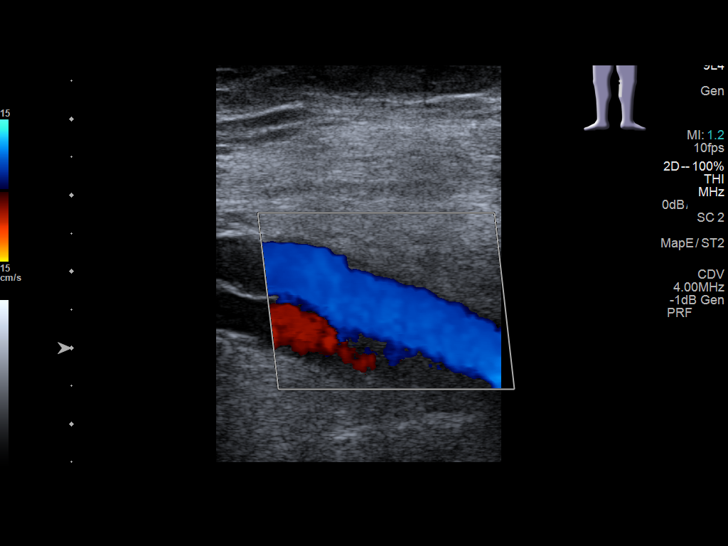
[im 32/35]
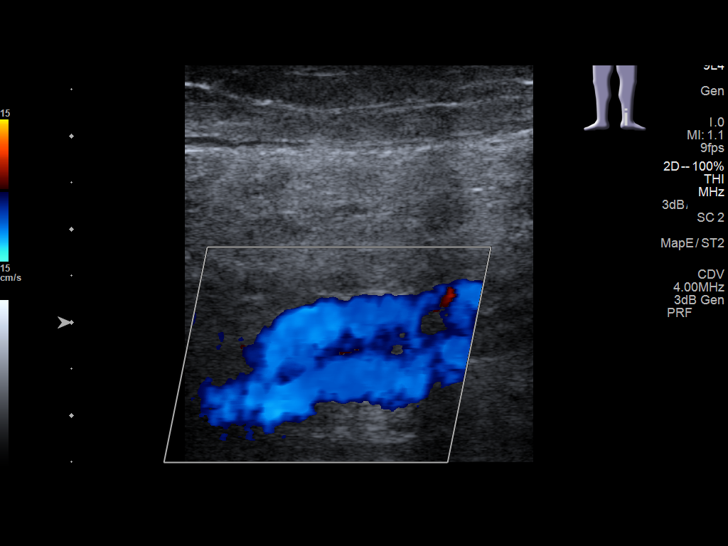
[im 35/35]
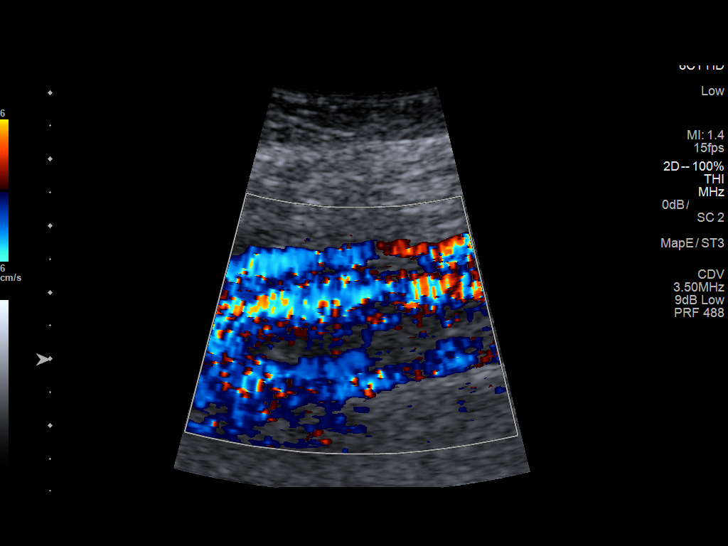

[13 of 24 positions shown; findings below may reference images not displayed]

FINDINGS: Contralateral Common Femoral Vein: Respiratory phasicity is normal
and symmetric with the symptomatic side. No evidence of thrombus.
Normal compressibility.

Common Femoral Vein: No evidence of thrombus. Normal
compressibility, respiratory phasicity and response to augmentation.

Saphenofemoral Junction: No evidence of thrombus. Normal
compressibility and flow on color Doppler imaging.

Profunda Femoral Vein: No evidence of thrombus. Normal
compressibility and flow on color Doppler imaging.

Femoral Vein: No evidence of thrombus. Normal compressibility,
respiratory phasicity and response to augmentation.

Popliteal Vein: No evidence of thrombus. Normal compressibility,
respiratory phasicity and response to augmentation.

Calf Veins: No evidence of thrombus. Normal compressibility and flow
on color Doppler imaging.

Superficial Great Saphenous Vein: No evidence of thrombus. Normal
compressibility.

Venous Reflux:  None.

Other Findings:  None.
IMPRESSION: No evidence of deep venous thrombosis.

## 2018-03-21 ENCOUNTER — Encounter: Payer: Self-pay | Admitting: Emergency Medicine

## 2018-03-21 ENCOUNTER — Emergency Department
Admission: EM | Admit: 2018-03-21 | Discharge: 2018-03-21 | Disposition: A | Discharge status: Home / Self Care | Payer: Medicare Other | Attending: Emergency Medicine | Admitting: Emergency Medicine

## 2018-03-21 ENCOUNTER — Emergency Department: Payer: Medicare Other

## 2018-03-21 ENCOUNTER — Other Ambulatory Visit: Payer: Self-pay

## 2018-03-21 DIAGNOSIS — J069 Acute upper respiratory infection, unspecified: Secondary | ICD-10-CM | POA: Insufficient documentation

## 2018-03-21 DIAGNOSIS — Z7982 Long term (current) use of aspirin: Secondary | ICD-10-CM | POA: Diagnosis not present

## 2018-03-21 DIAGNOSIS — E119 Type 2 diabetes mellitus without complications: Secondary | ICD-10-CM | POA: Insufficient documentation

## 2018-03-21 DIAGNOSIS — R05 Cough: Secondary | ICD-10-CM | POA: Insufficient documentation

## 2018-03-21 DIAGNOSIS — I10 Essential (primary) hypertension: Secondary | ICD-10-CM | POA: Diagnosis not present

## 2018-03-21 DIAGNOSIS — Z79899 Other long term (current) drug therapy: Secondary | ICD-10-CM | POA: Diagnosis not present

## 2018-03-21 DIAGNOSIS — R0981 Nasal congestion: Secondary | ICD-10-CM | POA: Diagnosis present

## 2018-03-21 MED ORDER — ALBUTEROL SULFATE HFA 108 (90 BASE) MCG/ACT IN AERS: 2.0000 | INHALATION_SPRAY | Freq: Four times a day (QID) | RESPIRATORY_TRACT | 0 refills | Status: DC | PRN

## 2018-03-21 MED ORDER — DOXYCYCLINE HYCLATE 50 MG PO CAPS: 100.0000 mg | ORAL_CAPSULE | Freq: Two times a day (BID) | ORAL | 0 refills | Status: AC

## 2018-03-21 MED ORDER — PREDNISONE 10 MG PO TABS: ORAL_TABLET | ORAL | 0 refills | Status: DC

## 2018-03-21 MED ORDER — BENZONATATE 100 MG PO CAPS
100.0000 mg | ORAL_CAPSULE | Freq: Once | ORAL | Status: AC
  Administered 2018-03-21: 100 mg via ORAL
  Filled 2018-03-21: qty 1

## 2018-03-21 MED ORDER — BENZONATATE 100 MG PO CAPS: 100.0000 mg | ORAL_CAPSULE | Freq: Three times a day (TID) | ORAL | 0 refills | Status: DC | PRN

## 2018-03-21 MED ORDER — IPRATROPIUM-ALBUTEROL 0.5-2.5 (3) MG/3ML IN SOLN
3.0000 mL | Freq: Once | RESPIRATORY_TRACT | Status: AC
Start: 2018-03-21 — End: 2018-03-21
  Administered 2018-03-21: 3 mL via RESPIRATORY_TRACT
  Filled 2018-03-21: qty 3

## 2018-03-21 NOTE — ED Triage Notes (Signed)
Cough and congestion x 3 days

## 2018-03-21 NOTE — ED Provider Notes (Signed)
[facility] Emergency Department Provider Note  ____________________________________________  Time seen: Approximately 6:41 PM  I have reviewed the triage vital signs and the nursing notes.   HISTORY  Chief Complaint Nasal Congestion and Cough    HPI [name] is a 77 y.o. female that presents emergency department for evaluation of nasal congestion and productive cough for 3 days.  Patient states that she is coughing up yellow and green sputum.  She felt like she had a fever today but did not check her temperature.  She does not smoke.  No allergies or asthma.  She saw her primary care provider this week on Monday.  No shortness of breath, chest pain, nausea, vomiting, abdominal pain.  Past Medical History:  Diagnosis Date  . Acquired hammertoe    other  . Acquired hammertoe   . Angina pectoris, unspecified ([place])   . Angina pectoris, unspecified ([place])   . Arthritis    knees  . Cancer ([place]) 1990,s   left breast  . Degenerative joint disease   . Dermatophytosis of nail   . Diabetes mellitus type 2, uncomplicated ([place])   . Essential hypertension, benign   . Exostosis    unspecified site  . Fracture of right ankle 2010   Followed by Dr. Vickki Muff  . Hypercholesterolemia   . Hyperlipidemia, unspecified   . Hypertension   . Lymphedema of leg    left leg worse than right leg.  . Obesity, unspecified   . Osteoarthrosis involving lower leg    unspecified whether generalized or localized   . Pre-diabetes   . Pure hypercholesterolemia   . Vitamin D deficiency, unspecified     Patient Active Problem List   Diagnosis Date Noted  . Status post total knee replacement using cement, right 08/19/2017  . Venous reflux 07/02/2017  . Lymphedema 07/02/2017  . Status post total knee replacement using cement, left 04/08/2017  . Bilateral lower extremity edema 03/31/2017  . Hyperlipidemia 03/31/2017  . Essential hypertension 03/31/2017    Past Surgical  History:  Procedure Laterality Date  . BREAST SURGERY Left 1990   lumpectomy  . BUNIONECTOMY Bilateral 1990's   plus hammer toe repair on left foot.  . ENDOMETRIAL BIOPSY    . FOOT SURGERY Left    for bunionectomy and hammertoe  . JOINT REPLACEMENT    . TONSILLECTOMY    . TOTAL KNEE ARTHROPLASTY Left 04/08/2017   Procedure: TOTAL KNEE ARTHROPLASTY;  Surgeon: Corky Mull, MD;  Location: [facility] ORS;  Service: Orthopedics;  Laterality: Left;  . TOTAL KNEE ARTHROPLASTY Right 08/19/2017   Procedure: TOTAL KNEE ARTHROPLASTY;  Surgeon: Corky Mull, MD;  Location: [facility] ORS;  Service: Orthopedics;  Laterality: Right;    Prior to Admission medications   Medication Sig Start Date End Date Taking? Authorizing Provider  acetaminophen (TYLENOL) 325 MG tablet Take 650 mg by mouth 4 (four) times daily.    [provider]  albuterol (PROVENTIL HFA;VENTOLIN HFA) 108 (90 Base) MCG/ACT inhaler Inhale 2 puffs into the lungs every 6 (six) hours as needed for wheezing or shortness of breath. 03/21/18   Laban Emperor, PA-C  aspirin EC 81 MG tablet Take 81 mg by mouth daily.    [provider]  atorvastatin (LIPITOR) 20 MG tablet Take 20 mg by mouth at bedtime.     [provider]  benzonatate (TESSALON PERLES) 100 MG capsule Take 1 capsule (100 mg total) by mouth 3 (three) times daily as needed for cough. 03/21/18  03/21/19  Laban Emperor, PA-C  doxycycline (VIBRAMYCIN) 50 MG capsule Take 2 capsules (100 mg total) by mouth 2 (two) times daily for 10 days. 03/21/18 03/31/18  Laban Emperor, PA-C  enoxaparin (LOVENOX) 40 MG/0.4ML injection Inject 0.4 mLs (40 mg total) into the skin daily. 08/22/17   Lattie Corns, PA-C  furosemide (LASIX) 40 MG tablet Take one tablet by mouth once daily as needed for lower extremity edema 05/09/17   [provider]  gabapentin (NEURONTIN) 300 MG capsule Take 300 mg by mouth at bedtime. 01/08/17   [provider]  lactulose (CHRONULAC) 10  GM/15ML solution Take 30 g by mouth every 12 (twelve) hours as needed.    [provider]  losartan (COZAAR) 25 MG tablet Take 25 mg by mouth daily.  01/20/17   [provider]  magnesium hydroxide (MILK OF MAGNESIA) 400 MG/5ML suspension Take 30 mLs by mouth 2 (two) times daily as needed.    [provider]  magnesium oxide (MAG-OX) 400 MG tablet Take 400 mg by mouth daily.    [provider]  methocarbamol (ROBAXIN) 500 MG tablet Take 500 mg by mouth 3 (three) times daily.    [provider]  nystatin cream (MYCOSTATIN) Apply 1 application topically 2 (two) times daily as needed. Apply thin film to yeast rash under the breasts     [provider]  Omega-3 Krill Oil 500 MG CAPS Take 500 mg by mouth 3 (three) times a week. On Monday Wednesday and Friday    [provider]  omeprazole (PRILOSEC) 20 MG capsule Take 20 mg by mouth daily. Acid reflux  01/06/17   [provider]  oxyCODONE (OXY IR/ROXICODONE) 5 MG immediate release tablet Take 1-2 tablets (5-10 mg total) by mouth every 4 (four) hours as needed for moderate pain. Patient not taking: Reported on 11/03/2017 08/22/17   Toni Arthurs, NP  potassium chloride (K-DUR,KLOR-CON) 10 MEQ tablet Take 10 mEq by mouth daily.     [provider]  predniSONE (DELTASONE) 10 MG tablet Take 6 tablets on day 1, take 5 tablets on day 2, take 4 tablets on day 3, take 3 tablets on day 4, take 2 tablets on day 5, take 1 tablet on day 6 03/21/18   Laban Emperor, PA-C  sennosides-docusate sodium (SENOKOT-S) 8.6-50 MG tablet Take 2 tablets by mouth 2 (two) times daily.     [provider]  solifenacin (VESICARE) 10 MG tablet Take 1 tablet (10 mg total) by mouth daily. 07/17/17   McGowan, Larene Beach A, PA-C  TOPROL XL 25 MG 24 hr tablet Take 25 mg by mouth at bedtime.  12/28/16   [provider]  traMADol (ULTRAM) 50 MG tablet Take 50 mg by mouth every 6 (six) hours as needed.     [provider]    Allergies Colesevelam; Oxybutynin; and Rosuvastatin  Family History  Problem Relation Age of Onset  . Stroke Mother   . Hypertension Mother   . Cancer Father   . Kidney cancer Neg Hx   . Kidney disease Neg Hx   . Prostate cancer Neg Hx     Social History Social History   Tobacco Use  . Smoking status: Never Smoker  . Smokeless tobacco: Never Used  Substance Use Topics  . Alcohol use: Yes    Comment: 1-2 drinks every 1-2 months  . Drug use: No     Review of Systems  Constitutional: Positive for chills. Eyes: No visual changes. No  discharge. ENT: Positive for congestion and rhinorrhea. Cardiovascular: No chest pain. Respiratory: Positive for cough. No SOB. Gastrointestinal: No abdominal pain.  No nausea, no vomiting.  Musculoskeletal: Negative for musculoskeletal pain. Skin: Negative for rash, abrasions, lacerations, ecchymosis. Neurological: Negative for headaches.   ____________________________________________   PHYSICAL EXAM:  VITAL SIGNS: ED Triage Vitals  Enc Vitals Group     BP 03/21/18 1751 (!) 121/51     Pulse Rate 03/21/18 1751 72     Resp 03/21/18 1751 18     Temp 03/21/18 1751 98.6 F (37 C)     Temp Source 03/21/18 1751 Oral     SpO2 03/21/18 1751 98 %     Weight 03/21/18 1754 240 lb (108.9 kg)     Height 03/21/18 1754 5\' 11"  (1.803 m)     Head Circumference --      Peak Flow --      Pain Score 03/21/18 1753 8     Pain Loc --      Pain Edu? --      Excl. in [place]? --      Constitutional: Alert and oriented. Well appearing and in no acute distress. Eyes: Conjunctivae are normal. PERRL. EOMI. No discharge. Head: Atraumatic. ENT: No frontal and maxillary sinus tenderness.      Ears: Tympanic membranes pearly gray with good landmarks. No discharge.      Nose: Mild congestion/rhinnorhea.      Mouth/Throat: Mucous membranes are moist. Oropharynx non-erythematous. Tonsils not enlarged. No exudates. Uvula  midline. Neck: No stridor.   Hematological/Lymphatic/Immunilogical: No cervical lymphadenopathy. Cardiovascular: Normal rate, regular rhythm.  Good peripheral circulation. Respiratory: Wet cough.  Normal respiratory effort without tachypnea or retractions. Lungs CTAB. Good air entry to the bases with no decreased or absent breath sounds. Gastrointestinal: Bowel sounds 4 quadrants. Soft and nontender to palpation. No guarding or rigidity. No palpable masses. No distention. Musculoskeletal: Full range of motion to all extremities. No gross deformities appreciated. Neurologic:  Normal speech and language. No gross focal neurologic deficits are appreciated.  Skin:  Skin is warm, dry and intact. No rash noted. Psychiatric: Mood and affect are normal. Speech and behavior are normal. Patient exhibits appropriate insight and judgement.   ____________________________________________   LABS (all labs ordered are listed, but only abnormal results are displayed)  Labs Reviewed - No data to display ____________________________________________  EKG   ____________________________________________  RADIOLOGY Robinette Haines, personally viewed and evaluated these images (plain radiographs) as part of my medical decision making, as well as reviewing the written report by the radiologist.  Dg Chest 2 View  Result Date: 03/21/2018 CLINICAL DATA:  Cough and congestion for 3 days. EXAM: CHEST - 2 VIEW COMPARISON:  08/21/2017 FINDINGS: Cardiac silhouette is normal in size and configuration. No mediastinal or hilar masses. No evidence of adenopathy. Prominent bronchovascular markings. No lung consolidation to suggest pneumonia. No pulmonary edema. No pleural effusion or pneumothorax. Skeletal structures are demineralized but intact. IMPRESSION: No active cardiopulmonary disease. Electronically Signed   By: Lajean Manes M.D.   On: 03/21/2018 18:11     ____________________________________________    PROCEDURES  Procedure(s) performed:    Procedures    Medications  ipratropium-albuterol (DUONEB) 0.5-2.5 (3) MG/3ML nebulizer solution 3 mL (3 mLs Nebulization Given 03/21/18 1910)  benzonatate (TESSALON) capsule 100 mg (100 mg Oral Given 03/21/18 1908)     ____________________________________________   INITIAL IMPRESSION / ASSESSMENT AND PLAN / ED COURSE  Pertinent labs & imaging results that were available during  my care of the patient were reviewed by me and considered in my medical decision making (see chart for details).  Review of the [place] CSRS was performed in accordance of the [place] prior to dispensing any controlled drugs.     Patient's diagnosis is consistent with upper respiratory infection with cough and congestion. Vital signs and exam are reassuring.  Chest x-ray negative for acute processes.  Patient appears well and is staying well hydrated. Patient feels comfortable going home. Patient will be discharged home with prescriptions for doxycycline, prednisone, albuterol inhaler, Tessalon Perles.  Patient is to follow up with primary care as needed or otherwise directed. Patient is given ED precautions to return to the ED for any worsening or new symptoms.     ____________________________________________  FINAL CLINICAL IMPRESSION(S) / ED DIAGNOSES  Final diagnoses:  URI with cough and congestion      NEW MEDICATIONS STARTED DURING THIS VISIT:  ED Discharge Orders         Ordered    doxycycline (VIBRAMYCIN) 50 MG capsule  2 times daily     03/21/18 1909    predniSONE (DELTASONE) 10 MG tablet     03/21/18 1909    albuterol (PROVENTIL HFA;VENTOLIN HFA) 108 (90 Base) MCG/ACT inhaler  Every 6 hours PRN     03/21/18 1909    benzonatate (TESSALON PERLES) 100 MG capsule  3 times daily PRN     03/21/18 1909              This chart was dictated using voice recognition software/Dragon. Despite best  efforts to proofread, errors can occur which can change the meaning. Any change was purely unintentional.    Laban Emperor, PA-C 03/21/18 2247    Nena Polio, MD 03/23/18 [phone]

## 2018-04-16 ENCOUNTER — Other Ambulatory Visit: Payer: Self-pay | Admitting: Internal Medicine

## 2018-04-16 DIAGNOSIS — R059 Cough, unspecified: Secondary | ICD-10-CM

## 2018-04-16 DIAGNOSIS — R05 Cough: Secondary | ICD-10-CM

## 2018-04-23 ENCOUNTER — Ambulatory Visit
Admission: RE | Admit: 2018-04-23 | Discharge: 2018-04-23 | Disposition: A | Discharge status: Home / Self Care | Payer: Medicare Other | Source: Ambulatory Visit | Attending: Internal Medicine | Admitting: Internal Medicine

## 2018-04-23 DIAGNOSIS — E042 Nontoxic multinodular goiter: Secondary | ICD-10-CM | POA: Insufficient documentation

## 2018-04-23 DIAGNOSIS — J42 Unspecified chronic bronchitis: Secondary | ICD-10-CM | POA: Insufficient documentation

## 2018-04-23 DIAGNOSIS — R918 Other nonspecific abnormal finding of lung field: Secondary | ICD-10-CM | POA: Diagnosis not present

## 2018-04-23 DIAGNOSIS — I700 Atherosclerosis of aorta: Secondary | ICD-10-CM | POA: Insufficient documentation

## 2018-04-23 DIAGNOSIS — I2510 Atherosclerotic heart disease of native coronary artery without angina pectoris: Secondary | ICD-10-CM | POA: Diagnosis not present

## 2018-04-23 DIAGNOSIS — J479 Bronchiectasis, uncomplicated: Secondary | ICD-10-CM | POA: Insufficient documentation

## 2018-04-23 DIAGNOSIS — R05 Cough: Secondary | ICD-10-CM | POA: Diagnosis not present

## 2018-04-23 DIAGNOSIS — R059 Cough, unspecified: Secondary | ICD-10-CM

## 2018-04-23 LAB — POCT I-STAT CREATININE: CREATININE: 1 mg/dL (ref 0.44–1.00)

## 2018-04-23 MED ORDER — IOPAMIDOL (ISOVUE-300) INJECTION 61%
75.0000 mL | Freq: Once | INTRAVENOUS | Status: AC | PRN
  Administered 2018-04-23: 75 mL via INTRAVENOUS

## 2018-05-08 ENCOUNTER — Ambulatory Visit
Admission: RE | Admit: 2018-05-08 | Discharge: 2018-05-08 | Disposition: A | Discharge status: Home / Self Care | Payer: Medicare Other | Source: Ambulatory Visit | Attending: Gastroenterology | Admitting: Gastroenterology

## 2018-05-08 ENCOUNTER — Ambulatory Visit: Payer: Medicare Other | Admitting: Anesthesiology

## 2018-05-08 ENCOUNTER — Encounter
Admission: RE | Disposition: A | Discharge status: Home / Self Care | Payer: Self-pay | Source: Ambulatory Visit | Attending: Gastroenterology

## 2018-05-08 DIAGNOSIS — D123 Benign neoplasm of transverse colon: Secondary | ICD-10-CM | POA: Diagnosis not present

## 2018-05-08 DIAGNOSIS — Z8371 Family history of colonic polyps: Secondary | ICD-10-CM | POA: Insufficient documentation

## 2018-05-08 DIAGNOSIS — Z96653 Presence of artificial knee joint, bilateral: Secondary | ICD-10-CM | POA: Insufficient documentation

## 2018-05-08 DIAGNOSIS — Z1211 Encounter for screening for malignant neoplasm of colon: Secondary | ICD-10-CM | POA: Insufficient documentation

## 2018-05-08 DIAGNOSIS — K5730 Diverticulosis of large intestine without perforation or abscess without bleeding: Secondary | ICD-10-CM | POA: Diagnosis not present

## 2018-05-08 DIAGNOSIS — I509 Heart failure, unspecified: Secondary | ICD-10-CM | POA: Diagnosis not present

## 2018-05-08 DIAGNOSIS — D120 Benign neoplasm of cecum: Secondary | ICD-10-CM | POA: Insufficient documentation

## 2018-05-08 DIAGNOSIS — E7800 Pure hypercholesterolemia, unspecified: Secondary | ICD-10-CM | POA: Insufficient documentation

## 2018-05-08 DIAGNOSIS — E1122 Type 2 diabetes mellitus with diabetic chronic kidney disease: Secondary | ICD-10-CM | POA: Insufficient documentation

## 2018-05-08 DIAGNOSIS — E559 Vitamin D deficiency, unspecified: Secondary | ICD-10-CM | POA: Insufficient documentation

## 2018-05-08 DIAGNOSIS — N182 Chronic kidney disease, stage 2 (mild): Secondary | ICD-10-CM | POA: Insufficient documentation

## 2018-05-08 DIAGNOSIS — I130 Hypertensive heart and chronic kidney disease with heart failure and stage 1 through stage 4 chronic kidney disease, or unspecified chronic kidney disease: Secondary | ICD-10-CM | POA: Diagnosis not present

## 2018-05-08 DIAGNOSIS — Z7982 Long term (current) use of aspirin: Secondary | ICD-10-CM | POA: Insufficient documentation

## 2018-05-08 DIAGNOSIS — Z79899 Other long term (current) drug therapy: Secondary | ICD-10-CM | POA: Diagnosis not present

## 2018-05-08 DIAGNOSIS — Z6823 Body mass index (BMI) 23.0-23.9, adult: Secondary | ICD-10-CM | POA: Insufficient documentation

## 2018-05-08 DIAGNOSIS — K635 Polyp of colon: Secondary | ICD-10-CM | POA: Insufficient documentation

## 2018-05-08 DIAGNOSIS — K219 Gastro-esophageal reflux disease without esophagitis: Secondary | ICD-10-CM | POA: Diagnosis not present

## 2018-05-08 DIAGNOSIS — E669 Obesity, unspecified: Secondary | ICD-10-CM | POA: Insufficient documentation

## 2018-05-08 HISTORY — DX: Dyspnea, unspecified: R06.00

## 2018-05-08 HISTORY — DX: Unspecified hemorrhoids: K64.9

## 2018-05-08 HISTORY — DX: Gastro-esophageal reflux disease without esophagitis: K21.9

## 2018-05-08 HISTORY — PX: COLONOSCOPY WITH PROPOFOL: SHX5780

## 2018-05-08 HISTORY — DX: Chronic kidney disease, unspecified: N18.9

## 2018-05-08 HISTORY — DX: Heart failure, unspecified: I50.9

## 2018-05-08 LAB — GLUCOSE, CAPILLARY: Glucose-Capillary: 98 mg/dL (ref 70–99)

## 2018-05-08 SURGERY — COLONOSCOPY WITH PROPOFOL
Anesthesia: General

## 2018-05-08 MED ORDER — ONDANSETRON HCL 4 MG/2ML IJ SOLN
INTRAMUSCULAR | Status: AC
  Filled 2018-05-08: qty 2

## 2018-05-08 MED ORDER — PHENYLEPHRINE HCL 10 MG/ML IJ SOLN
INTRAMUSCULAR | Status: DC | PRN
  Administered 2018-05-08: 50 ug via INTRAVENOUS

## 2018-05-08 MED ORDER — EPHEDRINE SULFATE 50 MG/ML IJ SOLN
INTRAMUSCULAR | Status: DC | PRN
  Administered 2018-05-08: 5 mg via INTRAVENOUS
  Administered 2018-05-08 (×3): 10 mg via INTRAVENOUS

## 2018-05-08 MED ORDER — SODIUM CHLORIDE 0.9 % IV SOLN: INTRAVENOUS | Status: DC

## 2018-05-08 MED ORDER — SODIUM CHLORIDE 0.9 % IV SOLN
2.0000 g | Freq: Once | INTRAVENOUS | Status: AC
  Administered 2018-05-08: 2 g via INTRAVENOUS

## 2018-05-08 MED ORDER — MIDAZOLAM HCL 2 MG/2ML IJ SOLN
INTRAMUSCULAR | Status: DC | PRN
  Administered 2018-05-08 (×2): 1 mg via INTRAVENOUS

## 2018-05-08 MED ORDER — PHENYLEPHRINE HCL 10 MG/ML IJ SOLN
INTRAMUSCULAR | Status: AC
  Filled 2018-05-08: qty 1

## 2018-05-08 MED ORDER — SODIUM CHLORIDE 0.9 % IV SOLN
INTRAVENOUS | Status: DC
  Administered 2018-05-08: 1000 mL via INTRAVENOUS

## 2018-05-08 MED ORDER — FENTANYL CITRATE (PF) 100 MCG/2ML IJ SOLN
INTRAMUSCULAR | Status: AC
  Filled 2018-05-08: qty 2

## 2018-05-08 MED ORDER — PROPOFOL 500 MG/50ML IV EMUL
INTRAVENOUS | Status: AC
  Filled 2018-05-08: qty 50

## 2018-05-08 MED ORDER — FENTANYL CITRATE (PF) 100 MCG/2ML IJ SOLN
INTRAMUSCULAR | Status: DC | PRN
  Administered 2018-05-08 (×2): 25 ug via INTRAVENOUS
  Administered 2018-05-08: 50 ug via INTRAVENOUS

## 2018-05-08 MED ORDER — SODIUM CHLORIDE 0.9 % IV SOLN
INTRAVENOUS | Status: AC
  Administered 2018-05-08: 2 g via INTRAVENOUS
  Filled 2018-05-08: qty 2000

## 2018-05-08 MED ORDER — MIDAZOLAM HCL 2 MG/2ML IJ SOLN
INTRAMUSCULAR | Status: AC
  Filled 2018-05-08: qty 2

## 2018-05-08 MED ORDER — EPHEDRINE SULFATE 50 MG/ML IJ SOLN
INTRAMUSCULAR | Status: AC
  Filled 2018-05-08: qty 1

## 2018-05-08 MED ORDER — PROPOFOL 500 MG/50ML IV EMUL
INTRAVENOUS | Status: DC | PRN
  Administered 2018-05-08: 100 ug/kg/min via INTRAVENOUS

## 2018-05-08 MED ORDER — ONDANSETRON HCL 4 MG/2ML IJ SOLN
INTRAMUSCULAR | Status: DC | PRN
  Administered 2018-05-08: 4 mg via INTRAVENOUS

## 2018-05-08 NOTE — Anesthesia Post-op Follow-up Note (Signed)
Anesthesia QCDR form completed.        

## 2018-05-08 NOTE — Op Note (Addendum)
[facility] Gastroenterology Patient Name: [name] Procedure Date: 05/08/2018 9:35 AM MRN: [id number] Account #: [id number] Date of Birth: 1941-06-02 Admit Type: Outpatient Age: 77 Room: [facility] ENDO ROOM 3 Gender: Female Note Status: Finalized Procedure:            Colonoscopy Indications:          Family history of colonic polyps in a first-degree                        relative Providers:            Lollie Sails, MD Referring MD:         Glendon Axe (Referring MD) Medicines:            Monitored Anesthesia Care Complications:        No immediate complications. Procedure:            Pre-Anesthesia Assessment:                       - ASA Grade Assessment: III - A patient with severe                        systemic disease.                       After obtaining informed consent, the colonoscope was                        passed under direct vision. Throughout the procedure,                        the patient's blood pressure, pulse, and oxygen                        saturations were monitored continuously. The                        Colonoscope was introduced through the anus and                        advanced to the the cecum, identified by appendiceal                        orifice and ileocecal valve. The colonoscopy was                        performed with moderate difficulty due to significant                        looping. Successful completion of the procedure was                        aided by changing the patient to a supine position. Findings:      Multiple medium-mouthed diverticula were found in the sigmoid colon and       descending colon.      Two sessile polyps were found in the hepatic flexure. The polyps were 2       to 3 mm in size. These polyps were removed with a cold biopsy forceps.       Resection and retrieval were complete.      A 12  mm polyp was found in the cecum. The polyp was sessile. The polyp       was removed with a  cold snare. Resection and retrieval were complete.      A 3 mm polyp was found in the cecum. The polyp was sessile. The polyp       was removed with a cold biopsy forceps. Resection and retrieval were       complete.      A 3 mm polyp was found in the mid sigmoid colon. The polyp was sessile.       The polyp was removed with a cold biopsy forceps. Resection and       retrieval were complete.      The digital rectal exam was normal.      The exam was otherwise without abnormality. Impression:           - Diverticulosis in the sigmoid colon and in the                        descending colon.                       - Two 2 to 3 mm polyps at the hepatic flexure, removed                        with a cold biopsy forceps. Resected and retrieved.                       - One 12 mm polyp in the cecum, removed with a cold                        snare. Resected and retrieved.                       - One 3 mm polyp in the cecum, removed with a cold                        biopsy forceps. Resected and retrieved.                       - One 3 mm polyp in the mid sigmoid colon, removed with                        a cold biopsy forceps. Resected and retrieved. Recommendation:       - Discharge patient to home.                       - Telephone GI clinic for pathology results in 1 week. Procedure Code(s):    --- Professional ---                       [phone], Colonoscopy, flexible; with removal of tumor(s),                        polyp(s), or other lesion(s) by snare technique                       33354, 59, Colonoscopy, flexible; with biopsy, single  or multiple Diagnosis Code(s):    --- Professional ---                       D12.3, Benign neoplasm of transverse colon (hepatic                        flexure or splenic flexure)                       D12.0, Benign neoplasm of cecum                       D12.5, Benign neoplasm of sigmoid colon                       Z83.71, Family  history of colonic polyps                       K57.30, Diverticulosis of large intestine without                        perforation or abscess without bleeding CPT copyright 2018 American Medical Association. All rights reserved. The codes documented in this report are preliminary and upon coder review may  be revised to meet current compliance requirements. Lollie Sails, MD 05/08/2018 10:15:06 AM This report has been signed electronically. Number of Addenda: 0 Note Initiated On: 05/08/2018 9:35 AM Scope Withdrawal Time: 0 hours 13 minutes 59 seconds  Total Procedure Duration: 0 hours 29 minutes 44 seconds       [facility]

## 2018-05-08 NOTE — Anesthesia Postprocedure Evaluation (Signed)
Anesthesia Post Note  Patient: [name]  Procedure(s) Performed: COLONOSCOPY WITH PROPOFOL (N/A )  Patient location during evaluation: Endoscopy Anesthesia Type: General Level of consciousness: awake and alert Pain management: pain level controlled Vital Signs Assessment: post-procedure vital signs reviewed and stable Respiratory status: spontaneous breathing, nonlabored ventilation, respiratory function stable and patient connected to nasal cannula oxygen Cardiovascular status: blood pressure returned to baseline and stable Postop Assessment: no apparent nausea or vomiting Anesthetic complications: no     Last Vitals:  Vitals:   05/08/18 1016 05/08/18 1026  BP:  139/61  Pulse: 79   Resp:    Temp: (!) 36.2 C   SpO2:  100%    Last Pain:  Vitals:   05/08/18 1036  TempSrc:   PainSc: 0-No pain                 Varick Keys S

## 2018-05-08 NOTE — Transfer of Care (Signed)
Immediate Anesthesia Transfer of Care Note  Patient: [name]  Procedure(s) Performed: COLONOSCOPY WITH PROPOFOL (N/A )  Patient Location: PACU  Anesthesia Type:General  Level of Consciousness: awake and sedated  Airway & Oxygen Therapy: Patient Spontanous Breathing and Patient connected to nasal cannula oxygen  Post-op Assessment: Report given to RN and Post -op Vital signs reviewed and stable  Post vital signs: Reviewed and stable  Last Vitals:  Vitals Value Taken Time  BP    Temp    Pulse    Resp    SpO2      Last Pain:  Vitals:   05/08/18 0858  TempSrc: Tympanic  PainSc: 0-No pain      Patients Stated Pain Goal: 0 (29/52/84 1324)  Complications: No apparent anesthesia complications

## 2018-05-08 NOTE — Anesthesia Preprocedure Evaluation (Addendum)
Anesthesia Evaluation  Patient identified by MRN, date of birth, ID band Patient awake    Reviewed: Allergy & Precautions, NPO status , Patient's Chart, lab work & pertinent test results, reviewed documented beta blocker date and time   Airway Mallampati: III  TM Distance: >3 FB     Dental  (+) Chipped, Partial Upper   Pulmonary shortness of breath,           Cardiovascular hypertension, Pt. on medications and Pt. on home beta blockers + angina +CHF       Neuro/Psych    GI/Hepatic GERD  ,  Endo/Other  diabetes, Type 2  Renal/GU Renal disease     Musculoskeletal  (+) Arthritis ,   Abdominal   Peds  Hematology   Anesthesia Other Findings Obese. CXR and EKG ok.  Reproductive/Obstetrics                            Anesthesia Physical Anesthesia Plan  ASA: III  Anesthesia Plan: General   Post-op Pain Management:    Induction: Intravenous  PONV Risk Score and Plan:   Airway Management Planned:   Additional Equipment:   Intra-op Plan:   Post-operative Plan:   Informed Consent: I have reviewed the patients History and Physical, chart, labs and discussed the procedure including the risks, benefits and alternatives for the proposed anesthesia with the patient or authorized representative who has indicated his/her understanding and acceptance.     Plan Discussed with: CRNA  Anesthesia Plan Comments:         Anesthesia Quick Evaluation

## 2018-05-08 NOTE — H&P (Signed)
Outpatient short stay form Pre-procedure 05/08/2018 9:33 AM Lollie Sails MD  Primary Physician: Elaina Pattee  Reason for visit: Colonoscopy  History of present illness: Patient is a 77 year old female presenting today for colonoscopy.  There is a family history of colon polyps in a primary relative, brother.  She tolerated her prep well.  She takes no aspirin or blood thinning agent with the exception of 81 mg aspirin.  That was held.  She does have a history of bilateral knee replacement within the past year.  sHe did receive antibiotics prior to the procedure.    Current Facility-Administered Medications:  .  0.9 %  sodium chloride infusion, , Intravenous, Continuous, Lollie Sails, MD .  0.9 %  sodium chloride infusion, , Intravenous, Continuous, Lollie Sails, MD, Last Rate: 20 mL/hr at 05/08/18 0923, 1,000 mL at 05/08/18 0923 .  ampicillin (OMNIPEN) 2 g in sodium chloride 0.9 % 100 mL IVPB, 2 g, Intravenous, Once, Lollie Sails, MD, Last Rate: 300 mL/hr at 05/08/18 0923, 2 g at 05/08/18 1027  Medications Prior to Admission  Medication Sig Dispense Refill Last Dose  . acetaminophen (TYLENOL) 325 MG tablet Take 650 mg by mouth 4 (four) times daily.   Past Week at Unknown time  . aspirin EC 81 MG tablet Take 81 mg by mouth daily.   Past Week at Unknown time  . atorvastatin (LIPITOR) 20 MG tablet Take 20 mg by mouth at bedtime.    Past Week at Unknown time  . benzonatate (TESSALON PERLES) 100 MG capsule Take 1 capsule (100 mg total) by mouth 3 (three) times daily as needed for cough. 30 capsule 0 Past Week at Unknown time  . ergocalciferol (VITAMIN D2) 1.25 MG (50000 UT) capsule Take 50,000 Units by mouth once a week.   Past Week at Unknown time  . furosemide (LASIX) 40 MG tablet Take one tablet by mouth once daily as needed for lower extremity edema   Past Week at Unknown time  . gabapentin (NEURONTIN) 300 MG capsule Take 300 mg by mouth at bedtime.   Past Week at  Unknown time  . hydrocortisone (ANUSOL-HC) 2.5 % rectal cream Place 1 application rectally 2 (two) times daily.   Past Week at Unknown time  . lactulose (CHRONULAC) 10 GM/15ML solution Take 30 g by mouth every 12 (twelve) hours as needed.   Past Week at Unknown time  . losartan (COZAAR) 25 MG tablet Take 25 mg by mouth daily.    Past Week at Unknown time  . magnesium hydroxide (MILK OF MAGNESIA) 400 MG/5ML suspension Take 30 mLs by mouth 2 (two) times daily as needed.   Past Week at Unknown time  . magnesium oxide (MAG-OX) 400 MG tablet Take 400 mg by mouth daily.   Past Week at Unknown time  . nystatin cream (MYCOSTATIN) Apply 1 application topically 2 (two) times daily as needed. Apply thin film to yeast rash under the breasts    Past Week at Unknown time  . Omega-3 Krill Oil 500 MG CAPS Take 500 mg by mouth 3 (three) times a week. On Monday Wednesday and Friday   Past Week at Unknown time  . omeprazole (PRILOSEC) 20 MG capsule Take 20 mg by mouth daily. Acid reflux    Past Week at Unknown time  . potassium chloride (K-DUR,KLOR-CON) 10 MEQ tablet Take 10 mEq by mouth daily.    Past Week at Unknown time  . sennosides-docusate sodium (SENOKOT-S) 8.6-50 MG tablet Take 2  tablets by mouth 2 (two) times daily.    Past Week at Unknown time  . solifenacin (VESICARE) 10 MG tablet Take 1 tablet (10 mg total) by mouth daily. 90 tablet 3 Past Week at Unknown time  . TOPROL XL 25 MG 24 hr tablet Take 25 mg by mouth at bedtime.    Past Week at Unknown time  . traMADol (ULTRAM) 50 MG tablet Take 50 mg by mouth every 6 (six) hours as needed.   Past Week at Unknown time  . albuterol (PROVENTIL HFA;VENTOLIN HFA) 108 (90 Base) MCG/ACT inhaler Inhale 2 puffs into the lungs every 6 (six) hours as needed for wheezing or shortness of breath. 1 Inhaler 0   . enoxaparin (LOVENOX) 40 MG/0.4ML injection Inject 0.4 mLs (40 mg total) into the skin daily. (Patient not taking: Reported on 05/07/2018) 14 Syringe 0 Completed Course  at Unknown time  . methocarbamol (ROBAXIN) 500 MG tablet Take 500 mg by mouth 3 (three) times daily.   Taking  . oxyCODONE (OXY IR/ROXICODONE) 5 MG immediate release tablet Take 1-2 tablets (5-10 mg total) by mouth every 4 (four) hours as needed for moderate pain. (Patient not taking: Reported on 11/03/2017) 120 tablet 0 Completed Course at Unknown time  . predniSONE (DELTASONE) 10 MG tablet Take 6 tablets on day 1, take 5 tablets on day 2, take 4 tablets on day 3, take 3 tablets on day 4, take 2 tablets on day 5, take 1 tablet on day 6 (Patient not taking: Reported on 05/07/2018) 21 tablet 0 Completed Course at Unknown time     Allergies  Allergen Reactions  . Colesevelam Other (See Comments)    constipation  . Oxybutynin Swelling    Leg swelling  . Rosuvastatin Other (See Comments)    Pain in lower extremities     Past Medical History:  Diagnosis Date  . Acquired hammertoe    other  . Acquired hammertoe   . Angina pectoris, unspecified ([place])   . Angina pectoris, unspecified ([place])   . Arthritis    knees  . Cancer ([place]) 1990,s   left breast  . CHF (congestive heart failure) ([place])   . Chronic kidney disease    stage 2  . Degenerative joint disease   . Dermatophytosis of nail   . Diabetes mellitus type 2, uncomplicated ([place])   . Dyspnea   . Essential hypertension, benign   . Exostosis    unspecified site  . Fracture of right ankle 2010   Followed by Dr. Vickki Muff  . GERD (gastroesophageal reflux disease)   . Hemorrhoids   . Hypercholesterolemia   . Hyperlipidemia, unspecified   . Hypertension   . Lymphedema of leg    left leg worse than right leg.  . Obesity, unspecified   . Osteoarthrosis involving lower leg    unspecified whether generalized or localized   . Pre-diabetes   . Pure hypercholesterolemia   . Vitamin D deficiency, unspecified     Review of systems:      Physical Exam    Heart and lungs: Regular rate and rhythm without rub or gallop, lungs are  bilaterally clear.    HEENT: Normocephalic atraumatic eyes are anicteric    Other:    Pertinant exam for procedure: Soft nontender nondistended bowel sounds positive normoactive.    Planned proceedures: Colonoscopy and indicated procedures. I have discussed the risks benefits and complications of procedures to include not limited to bleeding, infection, perforation and the risk of sedation and the  patient wishes to proceed.    Lollie Sails, MD Gastroenterology 05/08/2018  9:33 AM

## 2018-05-11 ENCOUNTER — Ambulatory Visit (INDEPENDENT_AMBULATORY_CARE_PROVIDER_SITE_OTHER): Payer: Medicare Other | Admitting: Vascular Surgery

## 2018-05-11 ENCOUNTER — Encounter: Payer: Self-pay | Admitting: Gastroenterology

## 2018-05-11 LAB — SURGICAL PATHOLOGY

## 2018-05-21 ENCOUNTER — Other Ambulatory Visit: Payer: Self-pay | Admitting: Internal Medicine

## 2018-05-21 DIAGNOSIS — N2889 Other specified disorders of kidney and ureter: Secondary | ICD-10-CM

## 2018-05-25 ENCOUNTER — Ambulatory Visit: Admission: RE | Admit: 2018-05-25 | Payer: Medicare Other | Source: Ambulatory Visit

## 2018-05-25 ENCOUNTER — Ambulatory Visit
Admission: RE | Admit: 2018-05-25 | Discharge: 2018-05-25 | Disposition: A | Discharge status: Home / Self Care | Payer: Medicare Other | Source: Ambulatory Visit | Attending: Internal Medicine | Admitting: Internal Medicine

## 2018-05-25 DIAGNOSIS — N2889 Other specified disorders of kidney and ureter: Secondary | ICD-10-CM | POA: Insufficient documentation

## 2018-05-25 MED ORDER — IOPAMIDOL (ISOVUE-300) INJECTION 61%
100.0000 mL | Freq: Once | INTRAVENOUS | Status: AC | PRN
  Administered 2018-05-25: 100 mL via INTRAVENOUS

## 2018-06-11 ENCOUNTER — Encounter: Payer: Self-pay | Admitting: Oncology

## 2018-06-11 ENCOUNTER — Inpatient Hospital Stay: Payer: Medicare Other

## 2018-06-11 ENCOUNTER — Inpatient Hospital Stay: Payer: Medicare Other | Attending: Oncology | Admitting: Oncology

## 2018-06-11 ENCOUNTER — Other Ambulatory Visit: Payer: Self-pay

## 2018-06-11 VITALS — BP 181/81 | HR 57 | Temp 96.8°F | Resp 18 | Ht 69.0 in | Wt 254.0 lb

## 2018-06-11 DIAGNOSIS — D7389 Other diseases of spleen: Secondary | ICD-10-CM | POA: Diagnosis present

## 2018-06-11 DIAGNOSIS — D72819 Decreased white blood cell count, unspecified: Secondary | ICD-10-CM

## 2018-06-11 DIAGNOSIS — D739 Disease of spleen, unspecified: Secondary | ICD-10-CM

## 2018-06-11 DIAGNOSIS — R1032 Left lower quadrant pain: Secondary | ICD-10-CM

## 2018-06-11 LAB — CBC WITH DIFFERENTIAL/PLATELET
ABS IMMATURE GRANULOCYTES: 0.01 10*3/uL (ref 0.00–0.07)
Basophils Absolute: 0 10*3/uL (ref 0.0–0.1)
Basophils Relative: 1 %
EOS PCT: 3 %
Eosinophils Absolute: 0.1 10*3/uL (ref 0.0–0.5)
HEMATOCRIT: 44.9 % (ref 36.0–46.0)
HEMOGLOBIN: 14.4 g/dL (ref 12.0–15.0)
Immature Granulocytes: 0 %
LYMPHS ABS: 1.6 10*3/uL (ref 0.7–4.0)
LYMPHS PCT: 32 %
MCH: 29.8 pg (ref 26.0–34.0)
MCHC: 32.1 g/dL (ref 30.0–36.0)
MCV: 92.8 fL (ref 80.0–100.0)
MONOS PCT: 9 %
Monocytes Absolute: 0.4 10*3/uL (ref 0.1–1.0)
Neutro Abs: 2.8 10*3/uL (ref 1.7–7.7)
Neutrophils Relative %: 55 %
PLATELETS: 177 10*3/uL (ref 150–400)
RBC: 4.84 MIL/uL (ref 3.87–5.11)
RDW: 13.3 % (ref 11.5–15.5)
WBC: 4.9 10*3/uL (ref 4.0–10.5)
nRBC: 0 % (ref 0.0–0.2)

## 2018-06-11 LAB — URIC ACID: URIC ACID, SERUM: 6.8 mg/dL (ref 2.5–7.1)

## 2018-06-11 LAB — LACTATE DEHYDROGENASE: LDH: 164 U/L (ref 98–192)

## 2018-06-11 NOTE — Progress Notes (Signed)
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  HISTORY OF PRESENTING ILLNESS:  [name] is a  77 y.o.  female with PMH listed below who was referred to me for evaluation of splenomegaly.  Patient recently has CT chest w contrast done for work up of cough. CT chest showed bilateral bronchiectasis, lung nodules and thyroid nodules.. Incidental findings of ?kidney mass, not reported on radiologist. Subsequently CT abdomen pelvis was done which showed 2.3 lateral left upper pole renal cyst. Incidental finding of 4.9 cm rounded enhancing lesion in the anterior spleen, now from previous study in 2010. Patient was referred to [facility] for further evaluation.   Patient was accompanied by a female friend.  She reports feeling well at baseline. She has chronic left side pain for the past 6 months, alleviated by taking Tylenol twice a day, pain is dull, not radiated, sometimes improved by bowel movements.  Good appetite, no unintentional weight loss, night sweat, fever or chills.    Review of Systems  Constitutional: Negative for appetite change, chills, fatigue and fever.  HENT:   Negative for hearing loss and voice change.   Eyes: Negative for eye problems.  Respiratory: Negative for chest tightness and cough.   Cardiovascular: Negative for chest pain.  Gastrointestinal: Positive for abdominal pain. Negative for abdominal distention and blood in stool.  Endocrine: Negative for hot flashes.  Genitourinary: Negative for difficulty urinating and frequency.   Musculoskeletal: Negative for arthralgias.  Skin: Negative for itching and rash.  Neurological: Negative for extremity weakness.  Hematological: Negative for adenopathy.    Psychiatric/Behavioral: Negative for confusion.    MEDICAL HISTORY:  Past Medical History:  Diagnosis Date  . Acquired hammertoe    other  . Acquired hammertoe   . Angina pectoris, unspecified ([place])   . Angina pectoris, unspecified ([place])   . Arthritis    knees  . Cancer ([place]) 1990,s   left breast  . CHF (congestive heart failure) ([place])   . Chronic kidney disease    stage 2  . Degenerative joint disease   . Dermatophytosis of nail   . Diabetes mellitus type 2, uncomplicated ([place])   . Dyspnea   . Essential hypertension, benign   . Exostosis    unspecified site  . Fracture of right ankle 2010   Followed by Dr. Vickki Muff  . GERD (gastroesophageal reflux disease)   . Hemorrhoids   . Hypercholesterolemia   . Hyperlipidemia, unspecified   . Hypertension   . Lymphedema of leg    left leg worse than right leg.  . Obesity, unspecified   . Osteoarthrosis involving lower leg    unspecified whether generalized or localized   . Pre-diabetes   . Pure hypercholesterolemia   . Vitamin D deficiency, unspecified     SURGICAL HISTORY: Past Surgical History:  Procedure Laterality Date  . BREAST LUMPECTOMY Left   . BREAST SURGERY Left 1990   lumpectomy  . BUNIONECTOMY Bilateral 1990's   plus hammer toe repair on left foot.  . COLONOSCOPY    . COLONOSCOPY WITH PROPOFOL N/A 05/08/2018   Procedure: COLONOSCOPY WITH PROPOFOL;  Surgeon: Lollie Sails, MD;  Location: [facility] ENDOSCOPY;  Service: Endoscopy;  Laterality: N/A;  . ENDOMETRIAL BIOPSY    . FOOT SURGERY Left    for bunionectomy and hammertoe  .  JOINT REPLACEMENT    . TONSILLECTOMY    . TOTAL KNEE ARTHROPLASTY Left 04/08/2017   Procedure: TOTAL KNEE ARTHROPLASTY;  Surgeon: Corky Mull, MD;  Location: [facility] ORS;  Service: Orthopedics;  Laterality: Left;  . TOTAL KNEE ARTHROPLASTY Right 08/19/2017   Procedure: TOTAL KNEE ARTHROPLASTY;  Surgeon: Corky Mull, MD;  Location: [facility] ORS;  Service: Orthopedics;  Laterality: Right;     SOCIAL HISTORY: Social History   Socioeconomic History  . Marital status: Widowed    Spouse name: Not on file  . Number of children: 2  . Years of education: Not on file  . Highest education level: Not on file  Occupational History  . Occupation: Retired  Scientific laboratory technician  . Financial resource strain: Not on file  . Food insecurity:    Worry: Not on file    Inability: Not on file  . Transportation needs:    Medical: Not on file    Non-medical: Not on file  Tobacco Use  . Smoking status: Never Smoker  . Smokeless tobacco: Never Used  Substance and Sexual Activity  . Alcohol use: Yes    Comment: 1-2 drinks every 1-2 months  . Drug use: No  . Sexual activity: Not on file  Lifestyle  . Physical activity:    Days per week: Not on file    Minutes per session: Not on file  . Stress: Not on file  Relationships  . Social connections:    Talks on phone: Not on file    Gets together: Not on file    Attends religious service: Not on file    Active member of club or organization: Not on file    Attends meetings of clubs or organizations: Not on file    Relationship status: Not on file  . Intimate partner violence:    Fear of current or ex partner: Not on file    Emotionally abused: Not on file    Physically abused: Not on file    Forced sexual activity: Not on file  Other Topics Concern  . Not on file  Social History Narrative  . Not on file    FAMILY HISTORY: Family History  Problem Relation Age of Onset  . Stroke Mother   . Hypertension Mother   . Cancer Father   . Prostate cancer Brother   . Kidney cancer Neg Hx   . Kidney disease Neg Hx     ALLERGIES:  is allergic to colesevelam; oxybutynin; and rosuvastatin.  MEDICATIONS:  Current Outpatient Medications  Medication Sig Dispense Refill  . acetaminophen (TYLENOL) 325 MG tablet Take 650 mg by mouth 4 (four) times daily.    Marland Kitchen albuterol (PROVENTIL HFA;VENTOLIN HFA) 108 (90 Base) MCG/ACT inhaler Inhale 2 puffs  into the lungs every 6 (six) hours as needed for wheezing or shortness of breath. 1 Inhaler 0  . aspirin EC 81 MG tablet Take 81 mg by mouth daily.    Marland Kitchen atorvastatin (LIPITOR) 20 MG tablet Take 20 mg by mouth at bedtime.     . diclofenac sodium (VOLTAREN) 1 % GEL Apply topically.    . ergocalciferol (VITAMIN D2) 1.25 MG (50000 UT) capsule Take 50,000 Units by mouth once a week.    . furosemide (LASIX) 40 MG tablet Take one tablet by mouth once daily as needed for lower extremity edema    . gabapentin (NEURONTIN) 300 MG capsule Take 300 mg by mouth at bedtime.    . hydrocortisone (ANUSOL-HC) 2.5 % rectal cream  Place 1 application rectally 2 (two) times daily.    . magnesium oxide (MAG-OX) 400 MG tablet Take 400 mg by mouth daily.    Marland Kitchen nystatin cream (MYCOSTATIN) Apply 1 application topically 2 (two) times daily as needed. Apply thin film to yeast rash under the breasts     . Omega-3 Krill Oil 500 MG CAPS Take 500 mg by mouth 3 (three) times a week. On Monday Wednesday and Friday    . omeprazole (PRILOSEC) 20 MG capsule Take 20 mg by mouth daily. Acid reflux     . potassium chloride (K-DUR,KLOR-CON) 10 MEQ tablet Take 10 mEq by mouth daily.     . sennosides-docusate sodium (SENOKOT-S) 8.6-50 MG tablet Take 2 tablets by mouth 2 (two) times daily.     . TOPROL XL 25 MG 24 hr tablet Take 25 mg by mouth at bedtime.     . traMADol (ULTRAM) 50 MG tablet Take 50 mg by mouth every 6 (six) hours as needed.    Marland Kitchen oxyCODONE (OXY IR/ROXICODONE) 5 MG immediate release tablet Take 1-2 tablets (5-10 mg total) by mouth every 4 (four) hours as needed for moderate pain. (Patient not taking: Reported on 11/03/2017) 120 tablet 0   No current facility-administered medications for this visit.      PHYSICAL EXAMINATION: ECOG PERFORMANCE STATUS: 1 - Symptomatic but completely ambulatory Vitals:   06/11/18 1508  BP: (!) 181/81  Pulse: (!) 57  Resp: 18  Temp: (!) 96.8 F (36 C)   Filed Weights   06/11/18 1508    Weight: 154 lb 12.8 oz (70.2 kg)    Physical Exam Constitutional:      General: She is not in acute distress. HENT:     Head: Normocephalic and atraumatic.  Eyes:     General: No scleral icterus.    Pupils: Pupils are equal, round, and reactive to light.  Neck:     Musculoskeletal: Normal range of motion and neck supple.  Cardiovascular:     Rate and Rhythm: Normal rate.     Heart sounds: Normal heart sounds.  Pulmonary:     Effort: Pulmonary effort is normal. No respiratory distress.     Breath sounds: No wheezing.  Abdominal:     General: Bowel sounds are normal. There is no distension.     Palpations: Abdomen is soft. There is no mass.     Tenderness: There is no abdominal tenderness.  Musculoskeletal: Normal range of motion.        General: No deformity.  Skin:    General: Skin is warm and dry.     Findings: No erythema or rash.  Neurological:     Mental Status: She is alert and oriented to person, place, and time.     Cranial Nerves: No cranial nerve deficit.     Coordination: Coordination normal.  Psychiatric:        Behavior: Behavior normal.        Thought Content: Thought content normal.      LABORATORY DATA:  I have reviewed the data as listed Lab Results  Component Value Date   WBC 4.9 06/11/2018   HGB 14.4 06/11/2018   HCT 44.9 06/11/2018   MCV 92.8 06/11/2018   PLT 177 06/11/2018   Recent Labs    08/20/17 0349 08/21/17 0407 08/22/17 0601 04/23/18 1606  NA 139 134* 134*  --   K 4.1 4.5 4.2  --   CL 110 104 105  --   CO2 24 23  23  --   GLUCOSE 119* 163* 132*  --   BUN 15 13 16   --   CREATININE 0.89 0.96 0.84 1.00  CALCIUM 8.7* 9.1 9.2  --   GFRNONAA >60 56* >60  --   GFRAA >60 >60 >60  --    Iron/TIBC/Ferritin/ %Sat No results found for: IRON, TIBC, FERRITIN, IRONPCTSAT      ASSESSMENT & PLAN:  1. Lesion of spleen   2. Leukopenia, unspecified type    # Spleen lesion, CT images were independently reviewed by me and discussed with  patient.  Differential includes hematoma, vs lymphoproliferative disease.  She does not have constitutional symptoms. I suggest checking baseline lab works including, cbc, smear, LDH, uric acid, flowcytometry. If work up negative, suggest repeat CT abdomen in 3 months for follow up.   # most recent cbc showed mild leukopenia. Repeat cbc, check hepatitis panel.multiple myeloma panel.  # Left side pain, chronic, and location is left lower quadrant. Etiology unknown. Doubt this is related to her spleen mass.   Orders Placed This Encounter  Procedures  . CBC with Differential/Platelet    Standing Status:   Future    Number of Occurrences:   1    Standing Expiration Date:   06/12/2019  . Lactate dehydrogenase    Standing Status:   Future    Number of Occurrences:   1    Standing Expiration Date:   06/12/2019  . Flow cytometry panel-leukemia/lymphoma work-up    Standing Status:   Future    Number of Occurrences:   1    Standing Expiration Date:   06/12/2019  . Hepatitis panel, acute    Standing Status:   Future    Number of Occurrences:   1    Standing Expiration Date:   06/12/2019  . Technologist smear review    Standing Status:   Future    Number of Occurrences:   1    Standing Expiration Date:   06/12/2019  . Uric acid    Standing Status:   Future    Number of Occurrences:   1    Standing Expiration Date:   06/12/2019  . Multiple Myeloma Panel (SPEP&IFE w/QIG)    Standing Status:   Future    Number of Occurrences:   1    Standing Expiration Date:   06/12/2019  . Kappa/lambda light chains    Standing Status:   Future    Number of Occurrences:   1    Standing Expiration Date:   06/12/2019    All questions were answered. The patient knows to call the clinic with any problems questions or concerns.  Return of visit: 3 weeks.  Thank you for this kind referral and the opportunity to participate in the care of this patient. A copy of today's note is routed to referring provider    Total face to face encounter time for this patient visit was 39mn. >50% of the time was  spent in counseling and coordination of care.    ZEarlie Server MD, PhD Hematology Oncology CNovato Community Hospitalat AW.J. Mangold Memorial HospitalPager- [phone]/05/2018

## 2018-06-11 NOTE — Progress Notes (Signed)
Patient here for initial visit. Patient's blood pressure elevated; Denies headache, dizziness. She states that she is under stress due to death in the family.

## 2018-06-12 LAB — KAPPA/LAMBDA LIGHT CHAINS
Kappa free light chain: 28.8 mg/L — ABNORMAL HIGH (ref 3.3–19.4)
Kappa, lambda light chain ratio: 1.53 (ref 0.26–1.65)
LAMDA FREE LIGHT CHAINS: 18.8 mg/L (ref 5.7–26.3)

## 2018-06-12 LAB — HEPATITIS PANEL, ACUTE
HCV Ab: <0.1 s/co ratio (ref 0.0–0.9)
Hep A IgM: NEGATIVE
Hep B C IgM: NEGATIVE
Hepatitis B Surface Ag: NEGATIVE

## 2018-06-15 LAB — COMP PANEL: LEUKEMIA/LYMPHOMA

## 2018-06-16 LAB — MULTIPLE MYELOMA PANEL, SERUM
ALBUMIN/GLOB SERPL: 1.4 (ref 0.7–1.7)
ALPHA2 GLOB SERPL ELPH-MCNC: 0.5 g/dL (ref 0.4–1.0)
Albumin SerPl Elph-Mcnc: 3.9 g/dL (ref 2.9–4.4)
Alpha 1: 0.2 g/dL (ref 0.0–0.4)
B-Globulin SerPl Elph-Mcnc: 1.1 g/dL (ref 0.7–1.3)
Gamma Glob SerPl Elph-Mcnc: 1.1 g/dL (ref 0.4–1.8)
Globulin, Total: 3 g/dL (ref 2.2–3.9)
IGA: 300 mg/dL (ref 64–422)
IGG (IMMUNOGLOBIN G), SERUM: 1135 mg/dL (ref 700–1600)
IGM (IMMUNOGLOBULIN M), SRM: 36 mg/dL (ref 26–217)
TOTAL PROTEIN ELP: 6.9 g/dL (ref 6.0–8.5)

## 2018-06-18 ENCOUNTER — Ambulatory Visit (INDEPENDENT_AMBULATORY_CARE_PROVIDER_SITE_OTHER): Payer: Medicare Other | Admitting: Vascular Surgery

## 2018-07-03 ENCOUNTER — Encounter: Payer: Self-pay | Admitting: Oncology

## 2018-07-03 ENCOUNTER — Inpatient Hospital Stay: Payer: Medicare Other | Attending: Oncology | Admitting: Oncology

## 2018-07-03 ENCOUNTER — Other Ambulatory Visit: Payer: Self-pay

## 2018-07-03 VITALS — BP 144/83 | HR 62 | Temp 97.0°F | Wt 260.0 lb

## 2018-07-03 DIAGNOSIS — Z7982 Long term (current) use of aspirin: Secondary | ICD-10-CM

## 2018-07-03 DIAGNOSIS — E1122 Type 2 diabetes mellitus with diabetic chronic kidney disease: Secondary | ICD-10-CM | POA: Diagnosis not present

## 2018-07-03 DIAGNOSIS — E559 Vitamin D deficiency, unspecified: Secondary | ICD-10-CM | POA: Diagnosis not present

## 2018-07-03 DIAGNOSIS — R161 Splenomegaly, not elsewhere classified: Secondary | ICD-10-CM

## 2018-07-03 DIAGNOSIS — K219 Gastro-esophageal reflux disease without esophagitis: Secondary | ICD-10-CM | POA: Diagnosis not present

## 2018-07-03 DIAGNOSIS — R918 Other nonspecific abnormal finding of lung field: Secondary | ICD-10-CM

## 2018-07-03 DIAGNOSIS — D739 Disease of spleen, unspecified: Secondary | ICD-10-CM

## 2018-07-03 DIAGNOSIS — N189 Chronic kidney disease, unspecified: Secondary | ICD-10-CM | POA: Insufficient documentation

## 2018-07-03 DIAGNOSIS — D7389 Other diseases of spleen: Secondary | ICD-10-CM

## 2018-07-03 DIAGNOSIS — I130 Hypertensive heart and chronic kidney disease with heart failure and stage 1 through stage 4 chronic kidney disease, or unspecified chronic kidney disease: Secondary | ICD-10-CM | POA: Diagnosis not present

## 2018-07-03 DIAGNOSIS — E785 Hyperlipidemia, unspecified: Secondary | ICD-10-CM | POA: Diagnosis not present

## 2018-07-03 DIAGNOSIS — Z79899 Other long term (current) drug therapy: Secondary | ICD-10-CM | POA: Insufficient documentation

## 2018-07-04 NOTE — Progress Notes (Signed)
Hematology/Oncology progress  note [facility] [phone] [phone]   Patient Care Team: Glendon Axe, MD as PCP - General (Internal Medicine)  REFERRING PROVIDER: Glendon Axe, MD REASON FOR VISIT:  Follow-up for management of spleen lesion.  HISTORY OF PRESENTING ILLNESS:  [name] is a  78 y.o.  female with PMH listed below who was referred to me for evaluation of splenomegaly.  Patient recently has CT chest w contrast done for work up of cough. CT chest showed bilateral bronchiectasis, lung nodules and thyroid nodules.. Incidental findings of ?kidney mass, not reported on radiologist. Subsequently CT abdomen pelvis was done which showed 2.3 lateral left upper pole renal cyst. Incidental finding of 4.9 cm rounded enhancing lesion in the anterior spleen, now from previous study in 2010. Patient was referred to [facility] for further evaluation.    INTERVAL HISTORY [name] is a 78 y.o. female who has above history reviewed by me today presents for follow up visit for management of  During interval patient has had lab work-up done and present for discussing of lab work-up results and further management plan.  No new complaints.  Good appetite, no unintentional weight loss, night sweats or fever or chills. Chronic left side pain alleviated by taking Tylenol twice a day.  Pain is dull, not radiated.  Sometimes improved after having bowel movement.   Review of Systems  Constitutional: Negative for appetite change, chills, fatigue and fever.  HENT:   Negative for hearing loss and voice change.   Eyes: Negative for eye problems.  Respiratory: Negative for chest tightness and cough.   Cardiovascular: Negative for chest pain.  Gastrointestinal: Positive for abdominal pain. Negative for abdominal distention and blood in stool.  Endocrine: Negative for hot flashes.  Genitourinary: Negative for difficulty urinating and frequency.     Musculoskeletal: Negative for arthralgias.  Skin: Negative for itching and rash.  Neurological: Negative for extremity weakness.  Hematological: Negative for adenopathy.  Psychiatric/Behavioral: Negative for confusion. The patient is nervous/anxious.     MEDICAL HISTORY:  Past Medical History:  Diagnosis Date  . Acquired hammertoe    other  . Acquired hammertoe   . Angina pectoris, unspecified ([place])   . Angina pectoris, unspecified ([place])   . Arthritis    knees  . Cancer ([place]) 1990,s   left breast  . CHF (congestive heart failure) ([place])   . Chronic kidney disease    stage 2  . Degenerative joint disease   . Dermatophytosis of nail   . Diabetes mellitus type 2, uncomplicated ([place])   . Dyspnea   . Essential hypertension, benign   . Exostosis    unspecified site  . Fracture of right ankle 2010   Followed by Dr. Vickki Muff  . GERD (gastroesophageal reflux disease)   . Hemorrhoids   . Hypercholesterolemia   . Hyperlipidemia, unspecified   . Hypertension   . Lymphedema of leg    left leg worse than right leg.  . Obesity, unspecified   . Osteoarthrosis involving lower leg    unspecified whether generalized or localized   . Pre-diabetes   . Pure hypercholesterolemia   . Vitamin D deficiency, unspecified     SURGICAL HISTORY: Past Surgical History:  Procedure Laterality Date  . BREAST LUMPECTOMY Left   . BREAST SURGERY Left 1990   lumpectomy  . BUNIONECTOMY Bilateral 1990's   plus hammer toe repair on left foot.  . COLONOSCOPY    . COLONOSCOPY WITH PROPOFOL N/A 05/08/2018  Procedure: COLONOSCOPY WITH PROPOFOL;  Surgeon: Lollie Sails, MD;  Location: [facility] ENDOSCOPY;  Service: Endoscopy;  Laterality: N/A;  . ENDOMETRIAL BIOPSY    . FOOT SURGERY Left    for bunionectomy and hammertoe  . JOINT REPLACEMENT    . TONSILLECTOMY    . TOTAL KNEE ARTHROPLASTY Left 04/08/2017   Procedure: TOTAL KNEE ARTHROPLASTY;  Surgeon: Corky Mull, MD;  Location: [facility] ORS;  Service:  Orthopedics;  Laterality: Left;  . TOTAL KNEE ARTHROPLASTY Right 08/19/2017   Procedure: TOTAL KNEE ARTHROPLASTY;  Surgeon: Corky Mull, MD;  Location: [facility] ORS;  Service: Orthopedics;  Laterality: Right;    SOCIAL HISTORY: Social History   Socioeconomic History  . Marital status: Widowed    Spouse name: Not on file  . Number of children: 2  . Years of education: Not on file  . Highest education level: Not on file  Occupational History  . Occupation: Retired  Scientific laboratory technician  . Financial resource strain: Not on file  . Food insecurity:    Worry: Not on file    Inability: Not on file  . Transportation needs:    Medical: Not on file    Non-medical: Not on file  Tobacco Use  . Smoking status: Never Smoker  . Smokeless tobacco: Never Used  Substance and Sexual Activity  . Alcohol use: Yes    Comment: 1-2 drinks every 1-2 months  . Drug use: No  . Sexual activity: Not on file  Lifestyle  . Physical activity:    Days per week: Not on file    Minutes per session: Not on file  . Stress: Not on file  Relationships  . Social connections:    Talks on phone: Not on file    Gets together: Not on file    Attends religious service: Not on file    Active member of club or organization: Not on file    Attends meetings of clubs or organizations: Not on file    Relationship status: Not on file  . Intimate partner violence:    Fear of current or ex partner: Not on file    Emotionally abused: Not on file    Physically abused: Not on file    Forced sexual activity: Not on file  Other Topics Concern  . Not on file  Social History Narrative  . Not on file    FAMILY HISTORY: Family History  Problem Relation Age of Onset  . Stroke Mother   . Hypertension Mother   . Cancer Father   . Prostate cancer Brother   . Kidney cancer Neg Hx   . Kidney disease Neg Hx     ALLERGIES:  is allergic to colesevelam; oxybutynin; and rosuvastatin.  MEDICATIONS:  Current Outpatient Medications    Medication Sig Dispense Refill  . acetaminophen (TYLENOL) 325 MG tablet Take 650 mg by mouth 4 (four) times daily.    Marland Kitchen albuterol (PROVENTIL HFA;VENTOLIN HFA) 108 (90 Base) MCG/ACT inhaler Inhale 2 puffs into the lungs every 6 (six) hours as needed for wheezing or shortness of breath. 1 Inhaler 0  . aspirin EC 81 MG tablet Take 81 mg by mouth daily.    Marland Kitchen atorvastatin (LIPITOR) 20 MG tablet Take 20 mg by mouth at bedtime.     . diclofenac sodium (VOLTAREN) 1 % GEL Apply topically.    . ergocalciferol (VITAMIN D2) 1.25 MG (50000 UT) capsule Take 50,000 Units by mouth once a week.    . furosemide (LASIX) 40  MG tablet Take one tablet by mouth once daily as needed for lower extremity edema    . gabapentin (NEURONTIN) 300 MG capsule Take 300 mg by mouth at bedtime.    . hydrocortisone (ANUSOL-HC) 2.5 % rectal cream Place 1 application rectally 2 (two) times daily.    . magnesium oxide (MAG-OX) 400 MG tablet Take 400 mg by mouth daily.    Marland Kitchen nystatin cream (MYCOSTATIN) Apply 1 application topically 2 (two) times daily as needed. Apply thin film to yeast rash under the breasts     . Omega-3 Krill Oil 500 MG CAPS Take 500 mg by mouth 3 (three) times a week. On Monday Wednesday and Friday    . omeprazole (PRILOSEC) 20 MG capsule Take 20 mg by mouth daily. Acid reflux     . potassium chloride (K-DUR,KLOR-CON) 10 MEQ tablet Take 10 mEq by mouth daily.     . sennosides-docusate sodium (SENOKOT-S) 8.6-50 MG tablet Take 2 tablets by mouth 2 (two) times daily.     . TOPROL XL 25 MG 24 hr tablet Take 25 mg by mouth at bedtime.     . traMADol (ULTRAM) 50 MG tablet Take 50 mg by mouth every 6 (six) hours as needed.     No current facility-administered medications for this visit.      PHYSICAL EXAMINATION: ECOG PERFORMANCE STATUS: 1 - Symptomatic but completely ambulatory Vitals:   07/03/18 1445  BP: (!) 144/83  Pulse: 62  Temp: (!) 97 F (36.1 C)   Filed Weights   07/03/18 1445  Weight: 260 lb (117.9  kg)    Physical Exam Constitutional:      General: She is not in acute distress. HENT:     Head: Normocephalic and atraumatic.  Eyes:     General: No scleral icterus.    Pupils: Pupils are equal, round, and reactive to light.  Neck:     Musculoskeletal: Normal range of motion and neck supple.  Cardiovascular:     Rate and Rhythm: Normal rate and regular rhythm.     Heart sounds: Normal heart sounds.  Pulmonary:     Effort: Pulmonary effort is normal. No respiratory distress.     Breath sounds: No wheezing.  Abdominal:     General: Bowel sounds are normal. There is no distension.     Palpations: Abdomen is soft. There is no mass.     Tenderness: There is no abdominal tenderness.  Musculoskeletal: Normal range of motion.        General: No deformity.  Skin:    General: Skin is warm and dry.     Findings: No erythema or rash.  Neurological:     Mental Status: She is alert and oriented to person, place, and time.     Cranial Nerves: No cranial nerve deficit.     Coordination: Coordination normal.  Psychiatric:        Behavior: Behavior normal.        Thought Content: Thought content normal.      LABORATORY DATA:  I have reviewed the data as listed Lab Results  Component Value Date   WBC 4.9 06/11/2018   HGB 14.4 06/11/2018   HCT 44.9 06/11/2018   MCV 92.8 06/11/2018   PLT 177 06/11/2018   Recent Labs    08/20/17 0349 08/21/17 0407 08/22/17 0601 04/23/18 1606  NA 139 134* 134*  --   K 4.1 4.5 4.2  --   CL 110 104 105  --   CO2 24  23 23  --   GLUCOSE 119* 163* 132*  --   BUN 15 13 16   --   CREATININE 0.89 0.96 0.84 1.00  CALCIUM 8.7* 9.1 9.2  --   GFRNONAA >60 56* >60  --   GFRAA >60 >60 >60  --    Iron/TIBC/Ferritin/ %Sat No results found for: IRON, TIBC, FERRITIN, IRONPCTSAT   RADIOGRAPHIC STUDIES: I have personally reviewed the radiological images as listed and agreed with the findings in the report. CT abdomen pelvis with and without contrast  05/25/2018 showed 2.3 cm lateral left upper pole renal cyst.  4.9 cm rounded enhancing lesion in the anterior spleen, likely new from 2010, or at least significantly increased in size.  Lymphoma is possible given the progression.  A benign hematoma technically possible.  Unfortunately MR would be unable to definitively characterize this lesion.  Consider follow-up CT abdomen in 3 to 6 months.  Versus PET scan as clinically warranted.  ASSESSMENT & PLAN:  1. Lesion of spleen    # Spleen lesion,  Differential includes hematoma, versus lymphoproliferative disease. Patient does not have constitutional symptoms. Basic lab work-up reviewed and discussed with patient. Multiple myeloma panel negative, negative hepatitis, CT images were independently reviewed by me and discussed with patient.  Differential includes hematoma, vs lymphoproliferative disease.  Peripheral blood showed no significant immunophenotypic abnormality. Normal uric acid at 6.8, LDH 164 Baseline work-up did not indicate an aggressive process. I had a lengthy discussion with patient. Options include proceeding with PET scan to see if the spleen lesion has any hypermetabolic activity.  No PET scan can have false positives which may render patient through unnecessary tissue sampling procedure. Biopsy of the spleen is not frequently performed due to the extensive vascularity and associated risk of bleeding. Splenectomy can be considered.  If suspicious of malignant process is high.  Another option is proceed with a CT scan in the next 3 to 6 months to access the rate of progression then decide.  Currently she is completely asymptomatic without any constitutional symptoms. Patient is very anxious about not knowing what is going on at this point. She request getting a second opinion at tertiary center. Will refer to [facility] oncology for further evaluation. Patient to call our cancer center if she wants to make follow-up appointments with  [place] after she sees [facility] oncology.  We spent sufficient time to discuss many aspect of care, questions were answered to patient's satisfaction.  All questions were answered. The patient knows to call the clinic with any problems questions or concerns.  Return of visit: 3 weeks.  Total face to face encounter time for this patient visit was 25 min. >50% of the time was  spent in counseling and coordination of care.   Earlie Server, MD, PhD Hematology Oncology [facility] at [facility] Pager- [phone] 07/04/2018

## 2018-07-09 ENCOUNTER — Encounter (INDEPENDENT_AMBULATORY_CARE_PROVIDER_SITE_OTHER): Payer: Self-pay | Admitting: Vascular Surgery

## 2018-07-09 ENCOUNTER — Ambulatory Visit (INDEPENDENT_AMBULATORY_CARE_PROVIDER_SITE_OTHER): Payer: Medicare Other | Admitting: Vascular Surgery

## 2018-07-09 VITALS — BP 155/74 | HR 66 | Resp 16 | Ht 69.0 in | Wt 257.2 lb

## 2018-07-09 DIAGNOSIS — I890 Lymphedema, not elsewhere classified: Secondary | ICD-10-CM

## 2018-07-09 DIAGNOSIS — I10 Essential (primary) hypertension: Secondary | ICD-10-CM

## 2018-07-09 DIAGNOSIS — E785 Hyperlipidemia, unspecified: Secondary | ICD-10-CM

## 2018-07-09 NOTE — Progress Notes (Signed)
MRN : [id number]  [name] is a 78 y.o. ([birth year]) female who presents with chief complaint of  Chief Complaint  Patient presents with  . Follow-up  .  History of Present Illness:   The patient returns to the office for followup evaluation regarding leg swelling.  The swelling has improved quite a bit and the pain associated with swelling has decreased substantially. There have not been any interval development of a ulcerations or wounds.  Since the previous visit the patient has been wearing graduated compression stockings and has noted little significant improvement in the lymphedema. The patient has been using compression routinely morning until night.  The patient also states elevation during the day and exercise is being done too.     Current Meds  Medication Sig  . acetaminophen (TYLENOL) 325 MG tablet Take 1,000 mg by mouth 2 (two) times daily.   Marland Kitchen albuterol (PROVENTIL HFA;VENTOLIN HFA) 108 (90 Base) MCG/ACT inhaler Inhale 2 puffs into the lungs every 6 (six) hours as needed for wheezing or shortness of breath.  Marland Kitchen aspirin EC 81 MG tablet Take 81 mg by mouth daily.  Marland Kitchen atorvastatin (LIPITOR) 20 MG tablet Take 20 mg by mouth at bedtime.   . diclofenac sodium (VOLTAREN) 1 % GEL Apply topically.  . ergocalciferol (VITAMIN D2) 1.25 MG (50000 UT) capsule Take 50,000 Units by mouth once a week.  . furosemide (LASIX) 40 MG tablet Take one tablet by mouth once daily as needed for lower extremity edema  . gabapentin (NEURONTIN) 300 MG capsule Take 300 mg by mouth at bedtime.  . hydrocortisone (ANUSOL-HC) 2.5 % rectal cream Place 1 application rectally 2 (two) times daily.  . magnesium oxide (MAG-OX) 400 MG tablet Take 400 mg by mouth daily.  Marland Kitchen nystatin cream (MYCOSTATIN) Apply 1 application topically 2 (two) times daily as needed. Apply thin film to yeast rash under the breasts   . omeprazole (PRILOSEC) 20 MG capsule Take 20 mg by mouth daily. Acid reflux   . potassium  chloride (K-DUR,KLOR-CON) 10 MEQ tablet Take 10 mEq by mouth daily.   . sennosides-docusate sodium (SENOKOT-S) 8.6-50 MG tablet Take 2 tablets by mouth 2 (two) times daily.   . TOPROL XL 25 MG 24 hr tablet Take 25 mg by mouth at bedtime.     Past Medical History:  Diagnosis Date  . Acquired hammertoe    other  . Acquired hammertoe   . Angina pectoris, unspecified ([place])   . Angina pectoris, unspecified ([place])   . Arthritis    knees  . Cancer ([place]) 1990,s   left breast  . CHF (congestive heart failure) ([place])   . Chronic kidney disease    stage 2  . Degenerative joint disease   . Dermatophytosis of nail   . Diabetes mellitus type 2, uncomplicated ([place])   . Dyspnea   . Essential hypertension, benign   . Exostosis    unspecified site  . Fracture of right ankle 2010   Followed by Dr. Vickki Muff  . GERD (gastroesophageal reflux disease)   . Hemorrhoids   . Hypercholesterolemia   . Hyperlipidemia, unspecified   . Hypertension   . Lymphedema of leg    left leg worse than right leg.  . Obesity, unspecified   . Osteoarthrosis involving lower leg    unspecified whether generalized or localized   . Pre-diabetes   . Pure hypercholesterolemia   . Vitamin D deficiency, unspecified     Past Surgical History:  Procedure Laterality  Date  . BREAST LUMPECTOMY Left   . BREAST SURGERY Left 1990   lumpectomy  . BUNIONECTOMY Bilateral 1990's   plus hammer toe repair on left foot.  . COLONOSCOPY    . COLONOSCOPY WITH PROPOFOL N/A 05/08/2018   Procedure: COLONOSCOPY WITH PROPOFOL;  Surgeon: Lollie Sails, MD;  Location: [facility] ENDOSCOPY;  Service: Endoscopy;  Laterality: N/A;  . ENDOMETRIAL BIOPSY    . FOOT SURGERY Left    for bunionectomy and hammertoe  . JOINT REPLACEMENT    . TONSILLECTOMY    . TOTAL KNEE ARTHROPLASTY Left 04/08/2017   Procedure: TOTAL KNEE ARTHROPLASTY;  Surgeon: Corky Mull, MD;  Location: [facility] ORS;  Service: Orthopedics;  Laterality: Left;  . TOTAL KNEE  ARTHROPLASTY Right 08/19/2017   Procedure: TOTAL KNEE ARTHROPLASTY;  Surgeon: Corky Mull, MD;  Location: [facility] ORS;  Service: Orthopedics;  Laterality: Right;    Social History Social History   Tobacco Use  . Smoking status: Never Smoker  . Smokeless tobacco: Never Used  Substance Use Topics  . Alcohol use: Yes    Comment: 1-2 drinks every 1-2 months  . Drug use: No    Family History Family History  Problem Relation Age of Onset  . Stroke Mother   . Hypertension Mother   . Cancer Father   . Prostate cancer Brother   . Kidney cancer Neg Hx   . Kidney disease Neg Hx     Allergies  Allergen Reactions  . Colesevelam Other (See Comments)    constipation  . Oxybutynin Swelling    Leg swelling  . Rosuvastatin Other (See Comments)    Pain in lower extremities     REVIEW OF SYSTEMS (Negative unless checked)  Constitutional: [] Weight loss  [] Fever  [] Chills Cardiac: [] Chest pain   [] Chest pressure   [] Palpitations   [] Shortness of breath when laying flat   [] Shortness of breath with exertion. Vascular:  [] Pain in legs with walking   [] Pain in legs at rest  [] History of DVT   [] Phlebitis   [x] Swelling in legs   [] Varicose veins   [] Non-healing ulcers Pulmonary:   [] Uses home oxygen   [] Productive cough   [] Hemoptysis   [] Wheeze  [] COPD   [] Asthma Neurologic:  [] Dizziness   [] Seizures   [] History of stroke   [] History of TIA  [] Aphasia   [] Vissual changes   [] Weakness or numbness in arm   [] Weakness or numbness in leg Musculoskeletal:   [] Joint swelling   [x] Joint pain   [] Low back pain Hematologic:  [] Easy bruising  [] Easy bleeding   [] Hypercoagulable state   [] Anemic Gastrointestinal:  [] Diarrhea   [] Vomiting  [] Gastroesophageal reflux/heartburn   [] Difficulty swallowing. Genitourinary:  [] Chronic kidney disease   [] Difficult urination  [] Frequent urination   [] Blood in urine Skin:  [] Rashes   [] Ulcers  Psychological:  [] History of anxiety   []  History of major  depression.  Physical Examination  Vitals:   07/09/18 1148  BP: (!) 155/74  Pulse: 66  Resp: 16  Weight: 257 lb 3.2 oz (116.7 kg)  Height: 5\' 9"  (1.753 m)   Body mass index is 37.98 kg/m. Gen: WD/WN, NAD Head: [place]/AT, No temporalis wasting.  Ear/Nose/Throat: Hearing grossly intact, nares w/o erythema or drainage Eyes: PER, EOMI, sclera nonicteric.  Neck: Supple, no large masses.   Pulmonary:  Good air movement, no audible wheezing bilaterally, no use of accessory muscles.  Cardiac: RRR, no JVD Vascular: scattered varicosities present bilaterally.  Mild venous stasis changes to the  legs bilaterally.  2-3+ soft pitting edema Vessel Right Left  Radial Palpable Palpable  PT Trace Palpable Trace Palpable  DP Trace Palpable Trace Palpable  Gastrointestinal: Non-distended. No guarding/no peritoneal signs.  Musculoskeletal: M/S 5/5 throughout.  No deformity or atrophy.  Neurologic: CN 2-12 intact. Symmetrical.  Speech is fluent. Motor exam as listed above. Psychiatric: Judgment intact, Mood & affect appropriate for pt's clinical situation. Dermatologic: mild venous rashes no ulcers noted.  No changes consistent with cellulitis. Lymph : No lichenification or skin changes of chronic lymphedema.  CBC Lab Results  Component Value Date   WBC 4.9 06/11/2018   HGB 14.4 06/11/2018   HCT 44.9 06/11/2018   MCV 92.8 06/11/2018   PLT 177 06/11/2018    BMET    Component Value Date/Time   NA 134 (L) 08/22/2017 0601   K 4.2 08/22/2017 0601   CL 105 08/22/2017 0601   CO2 23 08/22/2017 0601   GLUCOSE 132 (H) 08/22/2017 0601   BUN 16 08/22/2017 0601   CREATININE 1.00 04/23/2018 1606   CALCIUM 9.2 08/22/2017 0601   GFRNONAA >60 08/22/2017 0601   GFRAA >60 08/22/2017 0601   CrCl cannot be calculated (Patient's most recent lab result is older than the maximum 21 days allowed.).  COAG Lab Results  Component Value Date   INR 1.00 08/08/2017   INR 1.01 03/12/2017    Radiology No  results found.    Assessment/Plan 1. Lymphedema  No surgery or intervention at this point in time.    I have reviewed my discussion with the patient regarding lymphedema and why it  causes symptoms.  Patient will continue wearing graduated compression stockings class 1 (20-30 mmHg) on a daily basis a prescription was given. The patient is reminded to put the stockings on first thing in the morning and removing them in the evening. The patient is instructed specifically not to sleep in the stockings.   In addition, behavioral modification throughout the day will be continued.  This will include frequent elevation (such as in a recliner), use of over the counter pain medications as needed and exercise such as walking.  I have reviewed systemic causes for chronic edema such as liver, kidney and cardiac etiologies and there does not appear to be any significant changes in these organ systems over the past year.  The patient is under the impression that these organ systems are all stable and unchanged.    The patient will continue aggressive use of the  lymph pump.  This will continue to improve the edema control and prevent sequela such as ulcers and infections.   The patient will follow-up with me on an annual basis.    2. Hyperlipidemia, unspecified hyperlipidemia type Continue statin as ordered and reviewed, no changes at this time   3. Essential hypertension Continue antihypertensive medications as already ordered, these medications have been reviewed and there are no changes at this time.     Hortencia Pilar, MD  07/09/2018 12:00 PM

## 2018-07-19 IMAGING — US US EXTREM LOW VENOUS*R*
1 series · 13 of 24 positions shown · non-contrast
Comparison: 03/19/2017

CLINICAL DATA: RIGHT knee prosthesis, RIGHT leg pain and swelling
since knee prosthesis placement on 08/19/2017



[Series 1: us extrem low venous*right* · 0.06mm/px · 13 of 34 slices shown]
[im 1/34]
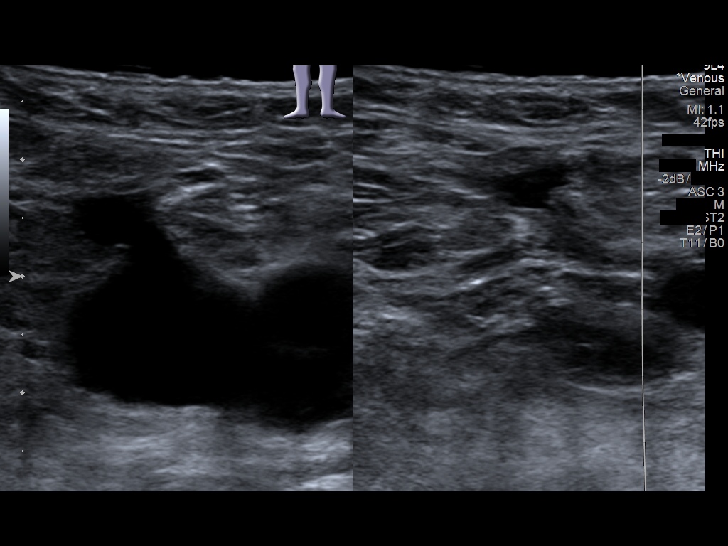
[im 3/34]
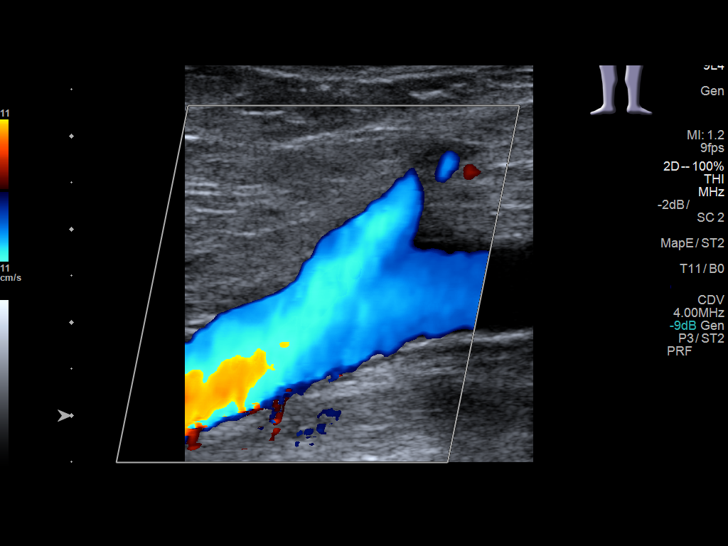
[im 6/34]
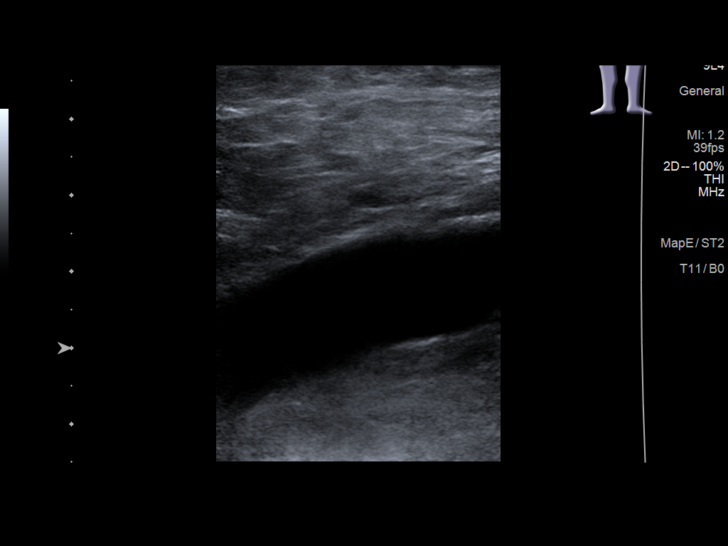
[im 9/34]
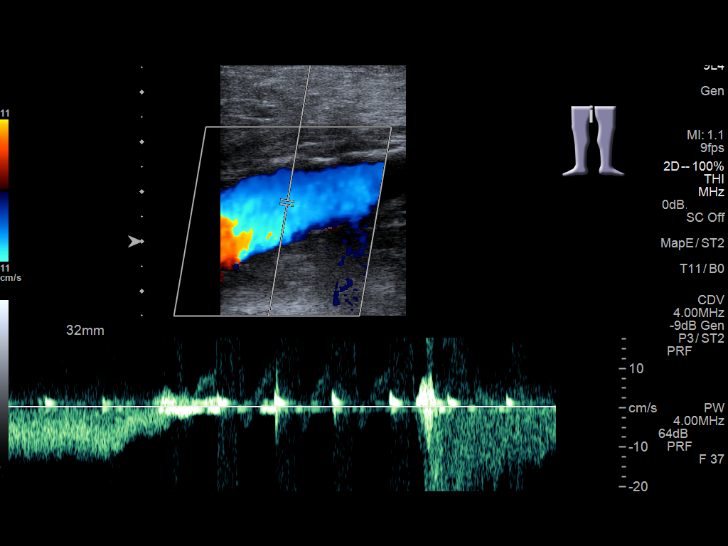
[im 12/34]
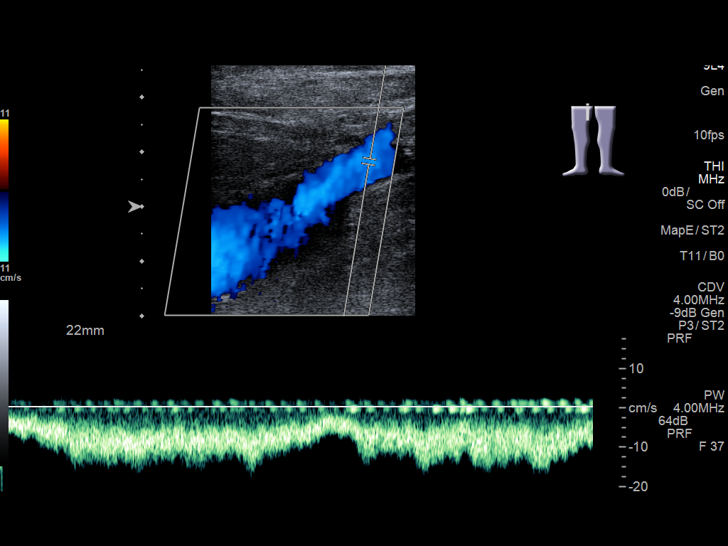
[im 15/34]
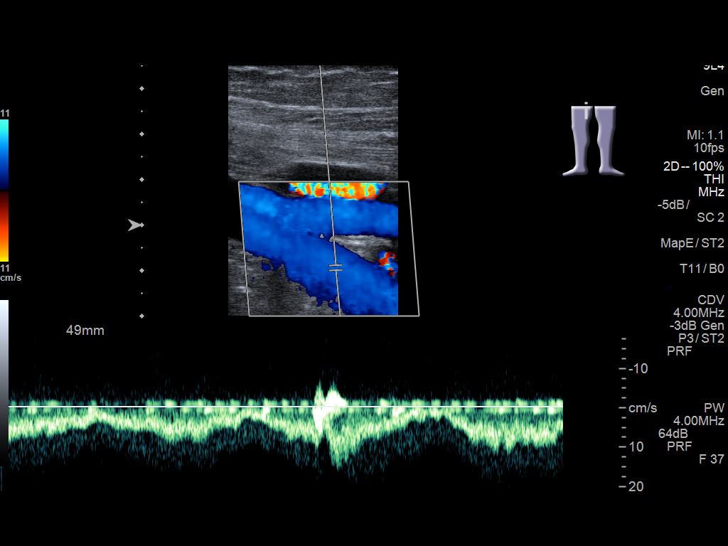
[im 18/34]
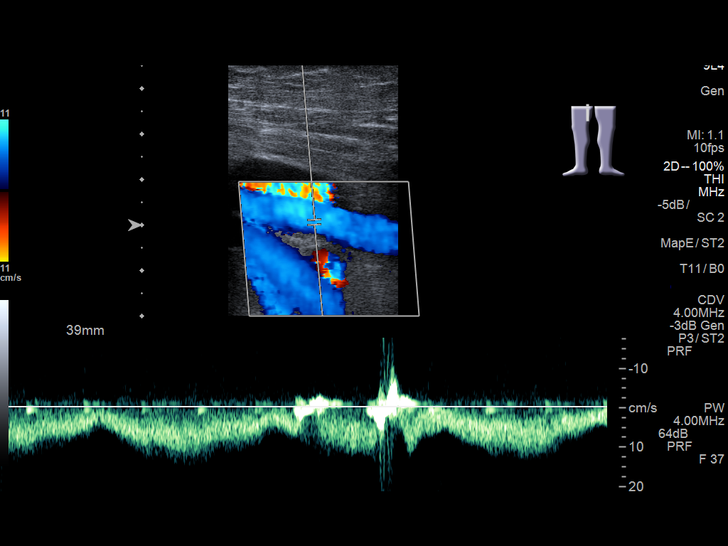
[im 19/34]
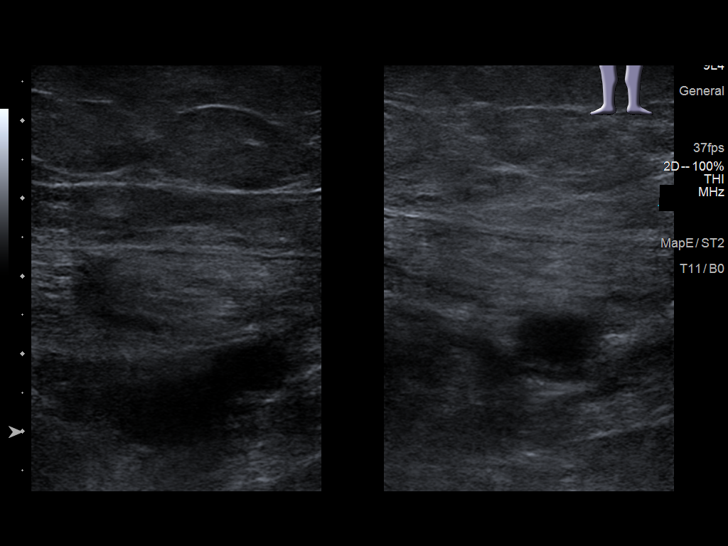
[im 22/34]
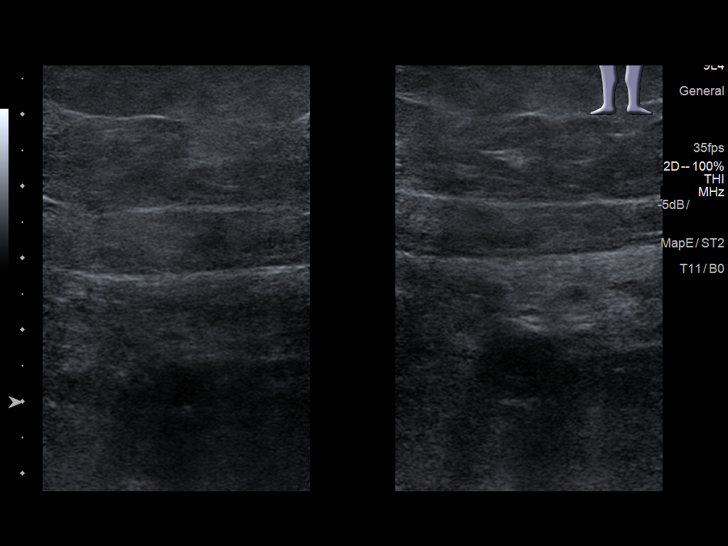
[im 25/34]
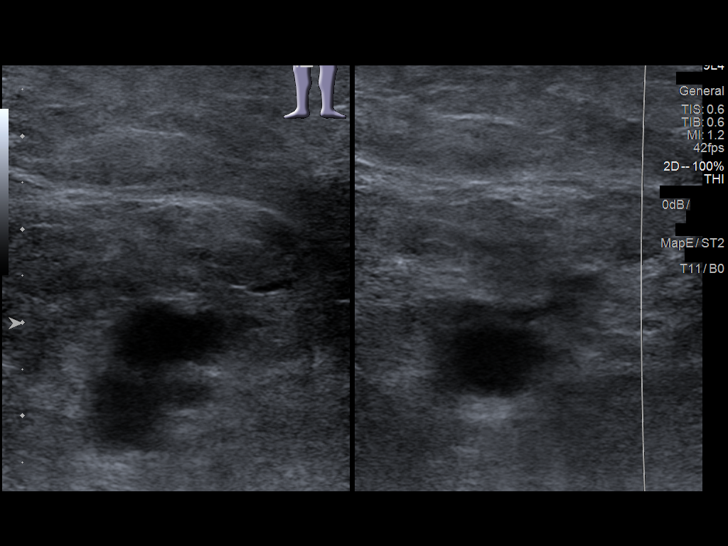
[im 28/34]
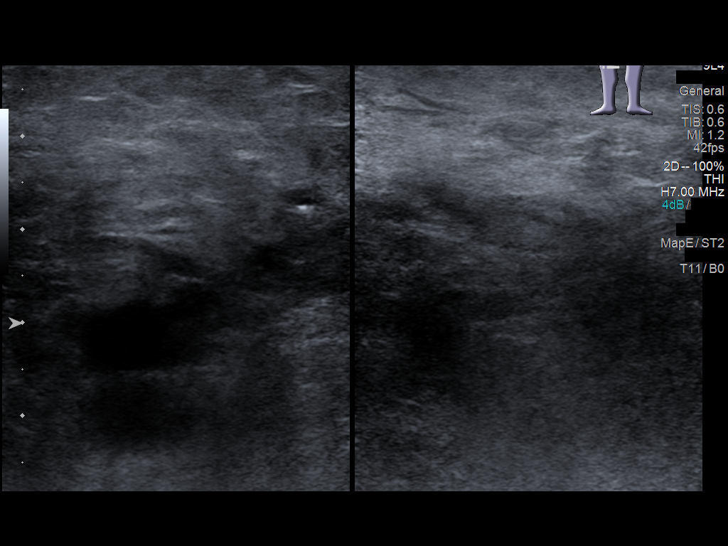
[im 31/34]
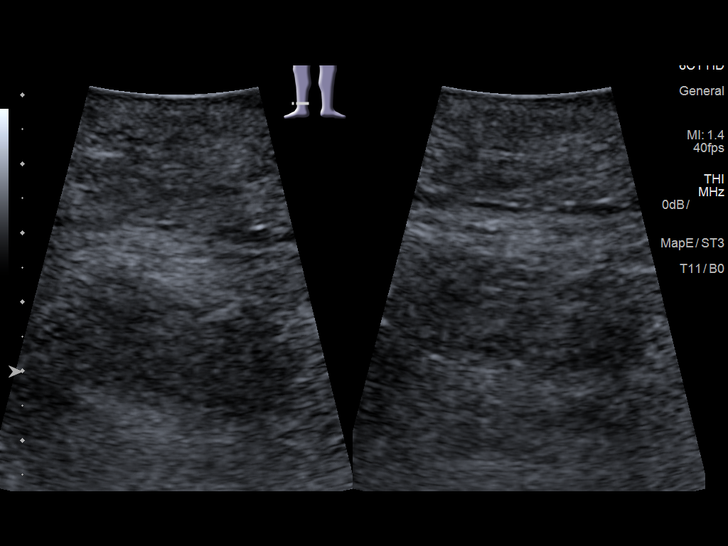
[im 34/34]
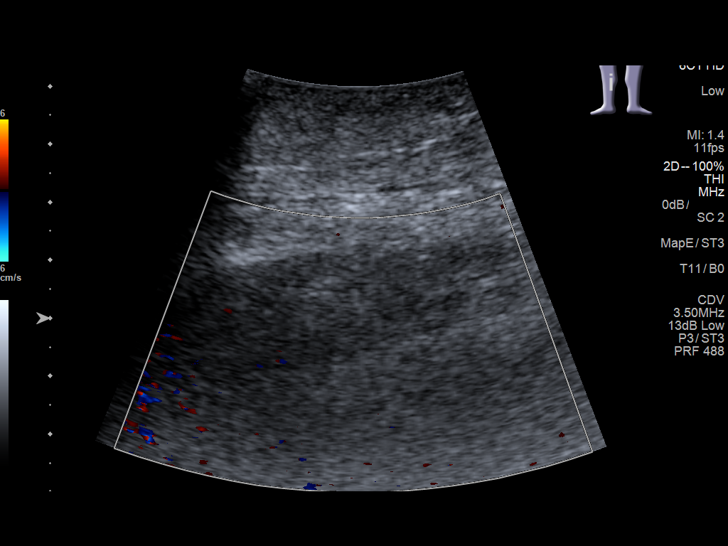

[13 of 24 positions shown; findings below may reference images not displayed]

FINDINGS: Contralateral Common Femoral Vein: Respiratory phasicity is normal
and symmetric with the symptomatic side. No evidence of thrombus.
Normal compressibility.

Common Femoral Vein: No evidence of thrombus. Normal
compressibility, respiratory phasicity and response to augmentation.

Saphenofemoral Junction: No evidence of thrombus. Normal
compressibility and flow on color Doppler imaging.

Profunda Femoral Vein: No evidence of thrombus. Normal
compressibility and flow on color Doppler imaging.

Femoral Vein: No evidence of thrombus. Normal compressibility,
respiratory phasicity and response to augmentation.

Popliteal Vein: No evidence of thrombus. Normal compressibility,
respiratory phasicity and response to augmentation.

Calf Veins: Limited visualization of calf veins due to soft tissue
swelling..

Superficial Great Saphenous Vein: No evidence of thrombus. Normal
compressibility.

Venous Reflux:  None.

Other Findings:  Subcutaneous edema at the RIGHT calf
IMPRESSION: No evidence of deep venous thrombosis in the RIGHT lower extremity.

## 2018-07-21 ENCOUNTER — Telehealth: Payer: Self-pay | Admitting: *Deleted

## 2018-07-21 NOTE — Telephone Encounter (Signed)
Patient called and states that she has not heard from [facility] and would like for [place] to please check on the referral  And  Get back to her. [phone]

## 2018-07-21 NOTE — Telephone Encounter (Signed)
Contacted [facility], they state they have not received.  Resent referral, will call back in 2 days to confirm that they received.  I will call pt to let her know status.

## 2018-08-03 ENCOUNTER — Other Ambulatory Visit: Payer: Self-pay

## 2018-08-03 ENCOUNTER — Encounter: Payer: Self-pay | Admitting: *Deleted

## 2018-08-05 NOTE — Discharge Instructions (Signed)

## 2018-08-07 NOTE — Anesthesia Preprocedure Evaluation (Addendum)
Anesthesia Evaluation  Patient identified by MRN, date of birth, ID band Patient awake    Reviewed: Allergy & Precautions, NPO status , Patient's Chart, lab work & pertinent test results  History of Anesthesia Complications Negative for: history of anesthetic complications  Airway Mallampati: II   Neck ROM: Full    Dental  (+)    Pulmonary neg pulmonary ROS,    Pulmonary exam normal breath sounds clear to auscultation       Cardiovascular hypertension, +CHF  Normal cardiovascular exam Rhythm:Regular Rate:Normal  LE lymphedema  ECG 08/08/17: Sinus bradycardia (HR 49) with sinus arrhythmia   Neuro/Psych negative neurological ROS     GI/Hepatic GERD  ,  Endo/Other  diabetes, Type 2  Renal/GU Renal disease (stage II CKD)     Musculoskeletal  (+) Arthritis ,   Abdominal   Peds  Hematology Breast CA   Anesthesia Other Findings   Reproductive/Obstetrics                            Anesthesia Physical Anesthesia Plan  ASA: II  Anesthesia Plan: MAC   Post-op Pain Management:    Induction: Intravenous  PONV Risk Score and Plan: 2 and TIVA and Midazolam  Airway Management Planned: Natural Airway  Additional Equipment:   Intra-op Plan:   Post-operative Plan:   Informed Consent: I have reviewed the patients History and Physical, chart, labs and discussed the procedure including the risks, benefits and alternatives for the proposed anesthesia with the patient or authorized representative who has indicated his/her understanding and acceptance.       Plan Discussed with: CRNA  Anesthesia Plan Comments:        Anesthesia Quick Evaluation

## 2018-08-10 ENCOUNTER — Ambulatory Visit: Payer: Medicare Other | Admitting: Anesthesiology

## 2018-08-10 ENCOUNTER — Encounter
Admission: RE | Disposition: A | Discharge status: Home / Self Care | Payer: Self-pay | Source: Home / Self Care | Attending: Ophthalmology

## 2018-08-10 ENCOUNTER — Ambulatory Visit
Admission: RE | Admit: 2018-08-10 | Discharge: 2018-08-10 | Disposition: A | Discharge status: Home / Self Care | Payer: Medicare Other | Attending: Ophthalmology | Admitting: Ophthalmology

## 2018-08-10 DIAGNOSIS — E7800 Pure hypercholesterolemia, unspecified: Secondary | ICD-10-CM | POA: Insufficient documentation

## 2018-08-10 DIAGNOSIS — I10 Essential (primary) hypertension: Secondary | ICD-10-CM | POA: Insufficient documentation

## 2018-08-10 DIAGNOSIS — H2512 Age-related nuclear cataract, left eye: Secondary | ICD-10-CM | POA: Insufficient documentation

## 2018-08-10 DIAGNOSIS — Z7982 Long term (current) use of aspirin: Secondary | ICD-10-CM | POA: Diagnosis not present

## 2018-08-10 DIAGNOSIS — R451 Restlessness and agitation: Secondary | ICD-10-CM | POA: Insufficient documentation

## 2018-08-10 DIAGNOSIS — K219 Gastro-esophageal reflux disease without esophagitis: Secondary | ICD-10-CM | POA: Diagnosis not present

## 2018-08-10 DIAGNOSIS — Z96653 Presence of artificial knee joint, bilateral: Secondary | ICD-10-CM | POA: Diagnosis not present

## 2018-08-10 DIAGNOSIS — E1136 Type 2 diabetes mellitus with diabetic cataract: Secondary | ICD-10-CM | POA: Insufficient documentation

## 2018-08-10 DIAGNOSIS — Z79899 Other long term (current) drug therapy: Secondary | ICD-10-CM | POA: Diagnosis not present

## 2018-08-10 HISTORY — DX: Presence of dental prosthetic device (complete) (partial): Z97.2

## 2018-08-10 HISTORY — PX: CATARACT EXTRACTION W/PHACO: SHX586

## 2018-08-10 LAB — GLUCOSE, CAPILLARY: Glucose-Capillary: 103 mg/dL — ABNORMAL HIGH (ref 70–99)

## 2018-08-10 SURGERY — PHACOEMULSIFICATION, CATARACT, WITH IOL INSERTION
Anesthesia: Monitor Anesthesia Care | Site: Eye | Laterality: Left

## 2018-08-10 MED ORDER — TETRACAINE HCL 0.5 % OP SOLN
1.0000 [drp] | OPHTHALMIC | Status: DC | PRN
  Administered 2018-08-10 (×3): 1 [drp] via OPHTHALMIC

## 2018-08-10 MED ORDER — LACTATED RINGERS IV SOLN: 10.0000 mL/h | INTRAVENOUS | Status: DC

## 2018-08-10 MED ORDER — ONDANSETRON HCL 4 MG/2ML IJ SOLN: 4.0000 mg | Freq: Once | INTRAMUSCULAR | Status: DC | PRN

## 2018-08-10 MED ORDER — SODIUM HYALURONATE 10 MG/ML IO SOLN
INTRAOCULAR | Status: DC | PRN
  Administered 2018-08-10: 0.55 mL via INTRAOCULAR

## 2018-08-10 MED ORDER — EPINEPHRINE PF 1 MG/ML IJ SOLN
INTRAOCULAR | Status: DC | PRN
  Administered 2018-08-10: 109 mL via OPHTHALMIC

## 2018-08-10 MED ORDER — ARMC OPHTHALMIC DILATING DROPS
1.0000 "application " | OPHTHALMIC | Status: DC | PRN
  Administered 2018-08-10 (×3): 1 via OPHTHALMIC

## 2018-08-10 MED ORDER — MOXIFLOXACIN HCL 0.5 % OP SOLN
OPHTHALMIC | Status: DC | PRN
  Administered 2018-08-10: 0.2 mL via OPHTHALMIC

## 2018-08-10 MED ORDER — SODIUM HYALURONATE 23 MG/ML IO SOLN
INTRAOCULAR | Status: DC | PRN
  Administered 2018-08-10: 0.6 mL via INTRAOCULAR

## 2018-08-10 MED ORDER — MIDAZOLAM HCL 2 MG/2ML IJ SOLN
INTRAMUSCULAR | Status: DC | PRN
  Administered 2018-08-10: 2 mg via INTRAVENOUS

## 2018-08-10 MED ORDER — FENTANYL CITRATE (PF) 100 MCG/2ML IJ SOLN
INTRAMUSCULAR | Status: DC | PRN
  Administered 2018-08-10: 50 ug via INTRAVENOUS

## 2018-08-10 MED ORDER — LIDOCAINE HCL (PF) 2 % IJ SOLN
INTRAOCULAR | Status: DC | PRN
  Administered 2018-08-10: 2 mL via INTRAOCULAR

## 2018-08-10 SURGICAL SUPPLY — 19 items
CANNULA ANT/CHMB 27G (MISCELLANEOUS) ×2 IMPLANT
CANNULA ANT/CHMB 27GA (MISCELLANEOUS) ×6 IMPLANT
DISSECTOR HYDRO NUCLEUS 50X22 (MISCELLANEOUS) ×3 IMPLANT
GLOVE SURG LX 7.5 STRW (GLOVE) ×2
GLOVE SURG LX STRL 7.5 STRW (GLOVE) ×1 IMPLANT
GLOVE SURG SYN 8.5  E (GLOVE) ×2
GLOVE SURG SYN 8.5 E (GLOVE) ×1 IMPLANT
GLOVE SURG SYN 8.5 PF PI (GLOVE) ×1 IMPLANT
GOWN STRL REUS W/ TWL LRG LVL3 (GOWN DISPOSABLE) ×2 IMPLANT
GOWN STRL REUS W/TWL LRG LVL3 (GOWN DISPOSABLE) ×4
LENS IOL TECNIS ITEC 22.5 (Intraocular Lens) ×2 IMPLANT
MARKER SKIN DUAL TIP RULER LAB (MISCELLANEOUS) ×3 IMPLANT
PACK DR. KING ARMS (PACKS) ×3 IMPLANT
PACK EYE AFTER SURG (MISCELLANEOUS) ×3 IMPLANT
PACK OPTHALMIC (MISCELLANEOUS) ×3 IMPLANT
SYR 3ML LL SCALE MARK (SYRINGE) ×3 IMPLANT
SYR TB 1ML LUER SLIP (SYRINGE) ×3 IMPLANT
WATER STERILE IRR 500ML POUR (IV SOLUTION) ×3 IMPLANT
WIPE NON LINTING 3.25X3.25 (MISCELLANEOUS) ×3 IMPLANT

## 2018-08-10 NOTE — Transfer of Care (Signed)
Immediate Anesthesia Transfer of Care Note  Patient: [name]  Procedure(s) Performed: CATARACT EXTRACTION PHACO AND INTRAOCULAR LENS PLACEMENT (IOC)  LEFT DIABETIC (Left Eye)  Patient Location: PACU  Anesthesia Type: MAC  Level of Consciousness: awake, alert  and patient cooperative  Airway and Oxygen Therapy: Patient Spontanous Breathing and Patient connected to supplemental oxygen  Post-op Assessment: Post-op Vital signs reviewed, Patient's Cardiovascular Status Stable, Respiratory Function Stable, Patent Airway and No signs of Nausea or vomiting  Post-op Vital Signs: Reviewed and stable  Complications: No apparent anesthesia complications

## 2018-08-10 NOTE — Anesthesia Procedure Notes (Signed)
Procedure Name: MAC Date/Time: 08/10/2018 7:31 AM Performed by: Janna Arch, CRNA Pre-anesthesia Checklist: Patient identified, Emergency Drugs available, Suction available, Timeout performed and Patient being monitored Patient Re-evaluated:Patient Re-evaluated prior to induction Oxygen Delivery Method: Nasal cannula Placement Confirmation: positive ETCO2

## 2018-08-10 NOTE — Anesthesia Postprocedure Evaluation (Signed)
Anesthesia Post Note  Patient: [name]  Procedure(s) Performed: CATARACT EXTRACTION PHACO AND INTRAOCULAR LENS PLACEMENT (IOC)  LEFT DIABETIC (Left Eye)  Patient location during evaluation: PACU Anesthesia Type: MAC Level of consciousness: awake and alert, oriented and patient cooperative Pain management: pain level controlled Vital Signs Assessment: post-procedure vital signs reviewed and stable Respiratory status: spontaneous breathing, nonlabored ventilation and respiratory function stable Cardiovascular status: blood pressure returned to baseline and stable Postop Assessment: adequate PO intake Anesthetic complications: no    Darrin Nipper

## 2018-08-10 NOTE — H&P (Signed)

## 2018-08-10 NOTE — Op Note (Signed)
OPERATIVE NOTE  [name] [id number] 08/10/2018   PREOPERATIVE DIAGNOSIS:  Nuclear sclerotic cataract left eye.  H25.12   POSTOPERATIVE DIAGNOSIS:    Nuclear sclerotic cataract left eye.     PROCEDURE:  Phacoemusification with posterior chamber intraocular lens placement of the left eye   LENS:   Implant Name Type Inv. Item Serial No. Manufacturer Lot No. LRB No. Used  LENS IOL DIOP 22.5 - [id number] Intraocular Lens LENS IOL DIOP 22.5 [id number]  Left 1       PCB00 +22.5   ULTRASOUND TIME: 1 minutes 17 seconds.  CDE 16.19   SURGEON:  Benay Pillow, MD, MPH   ANESTHESIA:  Topical with tetracaine drops augmented with 1% preservative-free intracameral lidocaine.  ESTIMATED BLOOD LOSS: <1 mL   COMPLICATIONS:  None.   DESCRIPTION OF PROCEDURE:  The patient was identified in the holding room and transported to the operating room and placed in the supine position under the operating microscope.  The left eye was identified as the operative eye and it was prepped and draped in the usual sterile ophthalmic fashion.   A 1.0 millimeter clear-corneal paracentesis was made at the 5:00 position. 0.5 ml of preservative-free 1% lidocaine with epinephrine was injected into the anterior chamber.  The anterior chamber was filled with Healon 5 viscoelastic.  A 2.4 millimeter keratome was used to make a near-clear corneal incision at the 2:00 position.  A curvilinear capsulorrhexis was made with a cystotome and capsulorrhexis forceps.  Balanced salt solution was used to hydrodissect and hydrodelineate the nucleus.   Phacoemulsification was then used in stop and chop fashion to remove the lens nucleus and epinucleus.  The remaining cortex was then removed using the irrigation and aspiration handpiece. Healon was then placed into the capsular bag to distend it for lens placement.  A lens was then injected into the capsular bag.  The remaining viscoelastic was aspirated.   Wounds were hydrated  with balanced salt solution.  The anterior chamber was inflated to a physiologic pressure with balanced salt solution.  Intracameral vigamox 0.1 mL undiltued was injected into the eye and a drop placed onto the ocular surface.  No wound leaks were noted.  The patient was taken to the recovery room in stable condition without complications of anesthesia or surgery  Benay Pillow 08/10/2018, 7:54 AM

## 2018-08-11 ENCOUNTER — Encounter: Payer: Self-pay | Admitting: Ophthalmology

## 2018-09-28 ENCOUNTER — Ambulatory Visit: Admit: 2018-09-28 | Payer: Medicare Other | Admitting: Ophthalmology

## 2018-09-28 SURGERY — PHACOEMULSIFICATION, CATARACT, WITH IOL INSERTION
Anesthesia: Topical | Laterality: Right

## 2018-10-17 IMAGING — CR DG CHEST 2V
1 series · 2 of 2 positions shown · non-contrast
Comparison: 08/21/2017

CLINICAL DATA: Cough and congestion for 3 days.

EXAM:
CHEST - 2 VIEW

[Series 1: dg chest 2 view · 0.14mm/px · 2 of 2 slices shown]
[im 1/2]
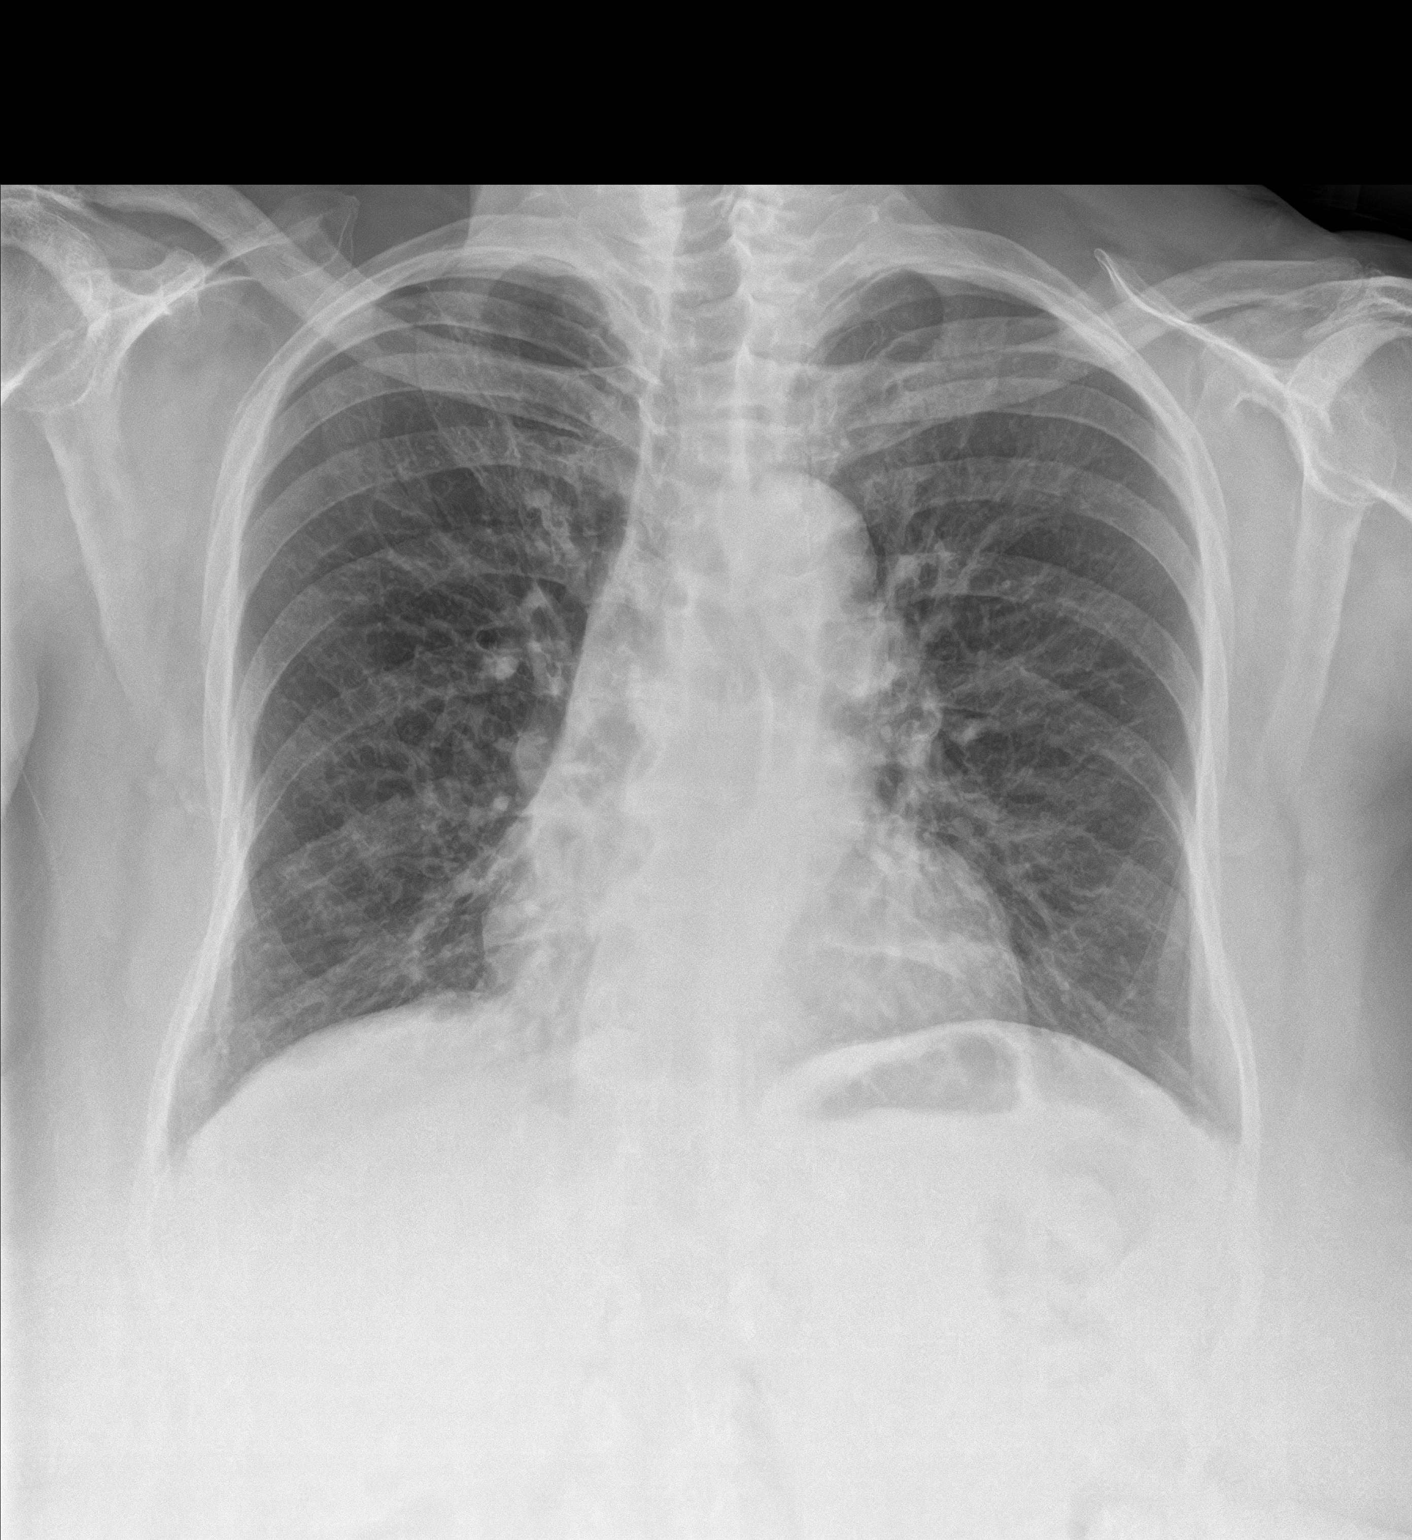
[im 2/2]
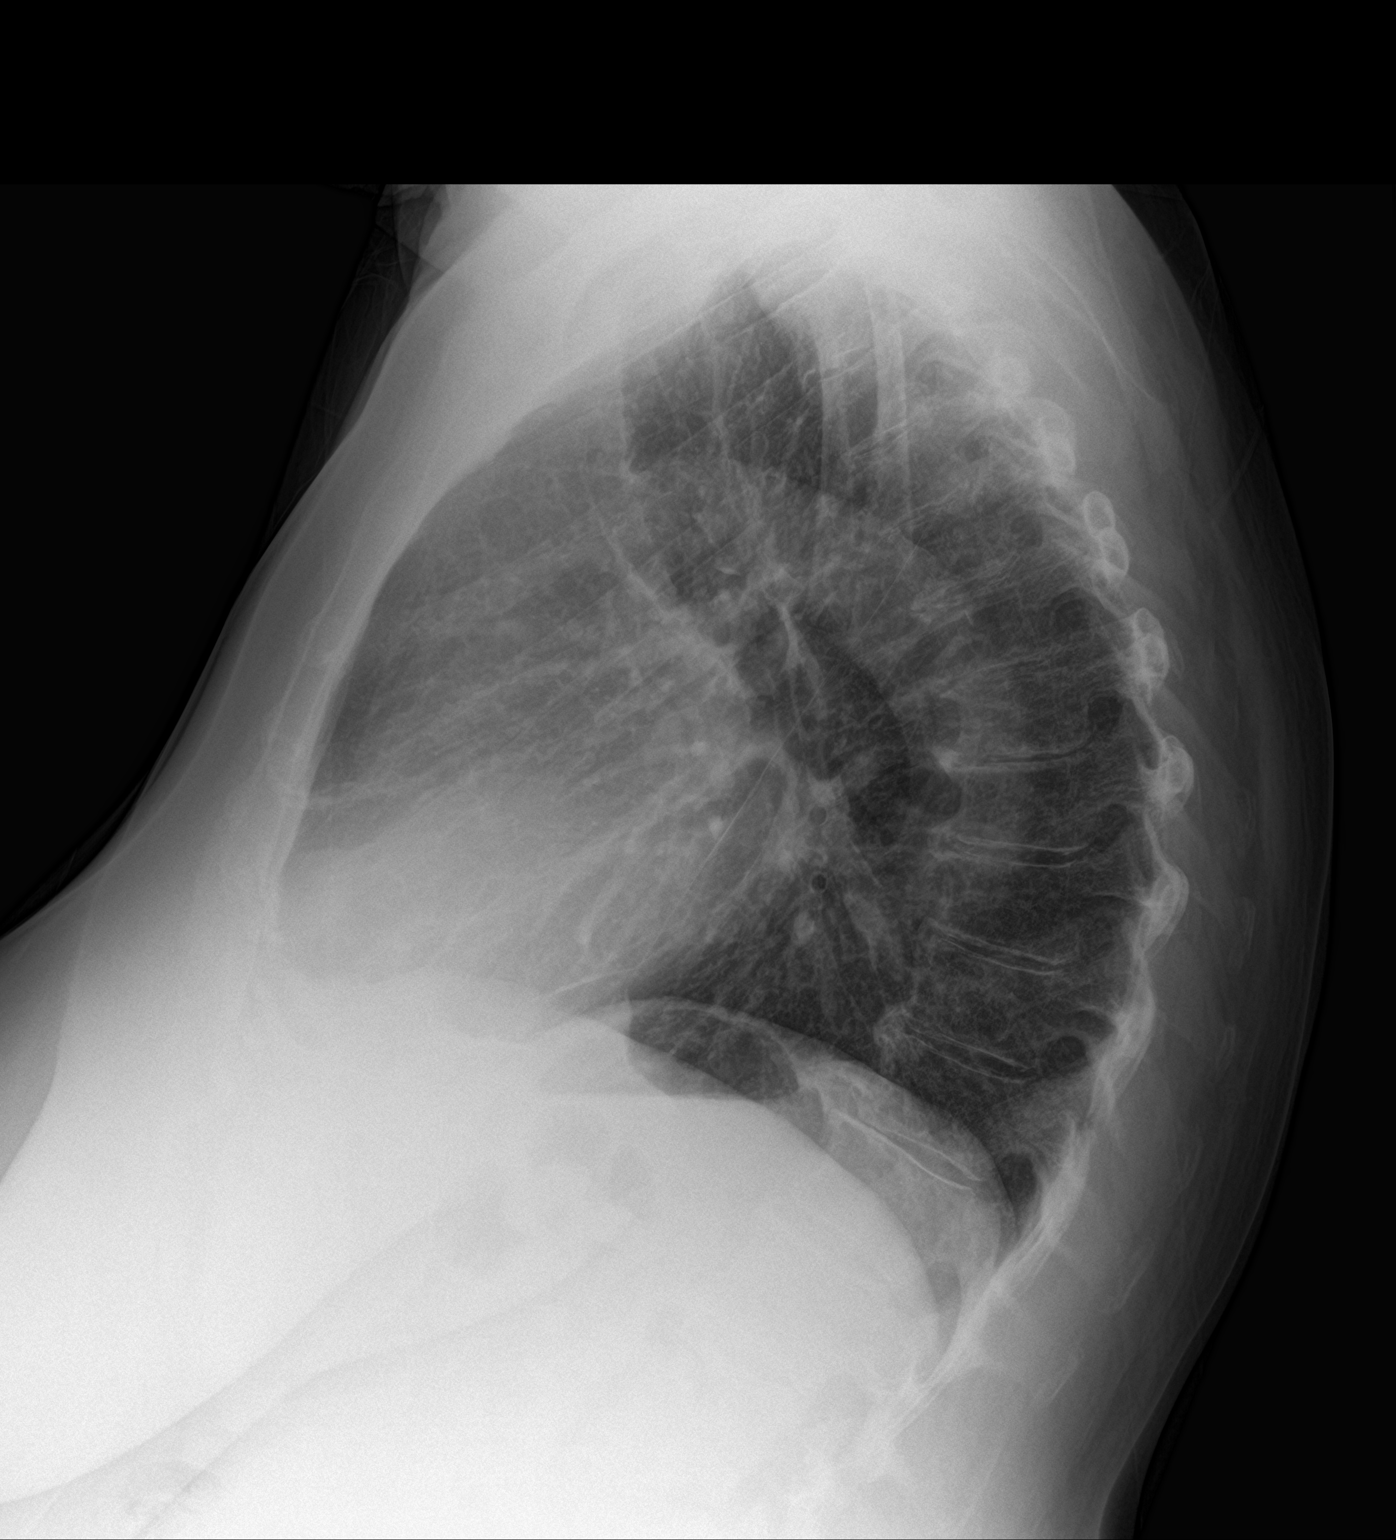

[2 of 2 positions shown; findings below may reference images not displayed]

FINDINGS: Cardiac silhouette is normal in size and configuration. No
mediastinal or hilar masses. No evidence of adenopathy.

Prominent bronchovascular markings. No lung consolidation to suggest
pneumonia. No pulmonary edema.

No pleural effusion or pneumothorax.

Skeletal structures are demineralized but intact.
IMPRESSION: No active cardiopulmonary disease.

## 2018-11-11 ENCOUNTER — Other Ambulatory Visit: Payer: Self-pay

## 2018-11-11 ENCOUNTER — Encounter: Payer: Self-pay | Admitting: *Deleted

## 2018-11-12 ENCOUNTER — Other Ambulatory Visit
Admission: RE | Admit: 2018-11-12 | Discharge: 2018-11-12 | Disposition: A | Discharge status: Home / Self Care | Payer: Medicare Other | Source: Ambulatory Visit | Attending: Ophthalmology | Admitting: Ophthalmology

## 2018-11-12 DIAGNOSIS — Z1159 Encounter for screening for other viral diseases: Secondary | ICD-10-CM | POA: Insufficient documentation

## 2018-11-13 LAB — NOVEL CORONAVIRUS, NAA (HOSP ORDER, SEND-OUT TO REF LAB; TAT 18-24 HRS): SARS-CoV-2, NAA: NOT DETECTED

## 2018-11-13 NOTE — Discharge Instructions (Signed)

## 2018-11-16 ENCOUNTER — Ambulatory Visit: Payer: Medicare Other | Admitting: Anesthesiology

## 2018-11-16 ENCOUNTER — Encounter
Admission: RE | Disposition: A | Discharge status: Home / Self Care | Payer: Self-pay | Source: Home / Self Care | Attending: Ophthalmology

## 2018-11-16 ENCOUNTER — Ambulatory Visit
Admission: RE | Admit: 2018-11-16 | Discharge: 2018-11-16 | Disposition: A | Discharge status: Home / Self Care | Payer: Medicare Other | Attending: Ophthalmology | Admitting: Ophthalmology

## 2018-11-16 DIAGNOSIS — Z96653 Presence of artificial knee joint, bilateral: Secondary | ICD-10-CM | POA: Insufficient documentation

## 2018-11-16 DIAGNOSIS — C261 Malignant neoplasm of spleen: Secondary | ICD-10-CM | POA: Diagnosis not present

## 2018-11-16 DIAGNOSIS — N182 Chronic kidney disease, stage 2 (mild): Secondary | ICD-10-CM | POA: Insufficient documentation

## 2018-11-16 DIAGNOSIS — Z7982 Long term (current) use of aspirin: Secondary | ICD-10-CM | POA: Insufficient documentation

## 2018-11-16 DIAGNOSIS — K219 Gastro-esophageal reflux disease without esophagitis: Secondary | ICD-10-CM | POA: Insufficient documentation

## 2018-11-16 DIAGNOSIS — E1122 Type 2 diabetes mellitus with diabetic chronic kidney disease: Secondary | ICD-10-CM | POA: Diagnosis not present

## 2018-11-16 DIAGNOSIS — Z853 Personal history of malignant neoplasm of breast: Secondary | ICD-10-CM | POA: Diagnosis not present

## 2018-11-16 DIAGNOSIS — H2511 Age-related nuclear cataract, right eye: Secondary | ICD-10-CM | POA: Insufficient documentation

## 2018-11-16 DIAGNOSIS — M1990 Unspecified osteoarthritis, unspecified site: Secondary | ICD-10-CM | POA: Insufficient documentation

## 2018-11-16 DIAGNOSIS — G2581 Restless legs syndrome: Secondary | ICD-10-CM | POA: Insufficient documentation

## 2018-11-16 DIAGNOSIS — E1136 Type 2 diabetes mellitus with diabetic cataract: Secondary | ICD-10-CM | POA: Diagnosis not present

## 2018-11-16 DIAGNOSIS — I509 Heart failure, unspecified: Secondary | ICD-10-CM | POA: Insufficient documentation

## 2018-11-16 DIAGNOSIS — I130 Hypertensive heart and chronic kidney disease with heart failure and stage 1 through stage 4 chronic kidney disease, or unspecified chronic kidney disease: Secondary | ICD-10-CM | POA: Insufficient documentation

## 2018-11-16 DIAGNOSIS — E7800 Pure hypercholesterolemia, unspecified: Secondary | ICD-10-CM | POA: Insufficient documentation

## 2018-11-16 DIAGNOSIS — Z79899 Other long term (current) drug therapy: Secondary | ICD-10-CM | POA: Insufficient documentation

## 2018-11-16 HISTORY — PX: CATARACT EXTRACTION W/PHACO: SHX586

## 2018-11-16 SURGERY — PHACOEMULSIFICATION, CATARACT, WITH IOL INSERTION
Anesthesia: Monitor Anesthesia Care | Site: Eye | Laterality: Right

## 2018-11-16 MED ORDER — SODIUM HYALURONATE 23 MG/ML IO SOLN
INTRAOCULAR | Status: DC | PRN
  Administered 2018-11-16: 0.6 mL via INTRAOCULAR

## 2018-11-16 MED ORDER — MOXIFLOXACIN HCL 0.5 % OP SOLN
OPHTHALMIC | Status: DC | PRN
  Administered 2018-11-16: 0.2 mL via OPHTHALMIC

## 2018-11-16 MED ORDER — LIDOCAINE HCL (PF) 2 % IJ SOLN
INTRAOCULAR | Status: DC | PRN
  Administered 2018-11-16: 2 mL via INTRAOCULAR

## 2018-11-16 MED ORDER — FENTANYL CITRATE (PF) 100 MCG/2ML IJ SOLN
INTRAMUSCULAR | Status: DC | PRN
  Administered 2018-11-16: 50 ug via INTRAVENOUS

## 2018-11-16 MED ORDER — EPINEPHRINE PF 1 MG/ML IJ SOLN
INTRAOCULAR | Status: DC | PRN
  Administered 2018-11-16: 08:00:00 88 mL via OPHTHALMIC

## 2018-11-16 MED ORDER — MIDAZOLAM HCL 2 MG/2ML IJ SOLN
INTRAMUSCULAR | Status: DC | PRN
  Administered 2018-11-16: 2 mg via INTRAVENOUS

## 2018-11-16 MED ORDER — TETRACAINE HCL 0.5 % OP SOLN
1.0000 [drp] | OPHTHALMIC | Status: DC | PRN
  Administered 2018-11-16 (×3): 1 [drp] via OPHTHALMIC

## 2018-11-16 MED ORDER — SODIUM HYALURONATE 10 MG/ML IO SOLN
INTRAOCULAR | Status: DC | PRN
  Administered 2018-11-16: 0.55 mL via INTRAOCULAR

## 2018-11-16 MED ORDER — ARMC OPHTHALMIC DILATING DROPS
1.0000 "application " | OPHTHALMIC | Status: DC | PRN
  Administered 2018-11-16 (×3): 1 via OPHTHALMIC

## 2018-11-16 MED ORDER — LACTATED RINGERS IV SOLN: 10.0000 mL/h | INTRAVENOUS | Status: DC

## 2018-11-16 SURGICAL SUPPLY — 19 items
CANNULA ANT/CHMB 27G (MISCELLANEOUS) ×2 IMPLANT
CANNULA ANT/CHMB 27GA (MISCELLANEOUS) ×6 IMPLANT
DISSECTOR HYDRO NUCLEUS 50X22 (MISCELLANEOUS) ×3 IMPLANT
GLOVE SURG LX 7.5 STRW (GLOVE) ×2
GLOVE SURG LX STRL 7.5 STRW (GLOVE) ×1 IMPLANT
GLOVE SURG SYN 8.5  E (GLOVE) ×2
GLOVE SURG SYN 8.5 E (GLOVE) ×1 IMPLANT
GLOVE SURG SYN 8.5 PF PI (GLOVE) ×1 IMPLANT
GOWN STRL REUS W/ TWL LRG LVL3 (GOWN DISPOSABLE) ×2 IMPLANT
GOWN STRL REUS W/TWL LRG LVL3 (GOWN DISPOSABLE) ×4
LENS IOL TECNIS ITEC 22.5 (Intraocular Lens) ×2 IMPLANT
MARKER SKIN DUAL TIP RULER LAB (MISCELLANEOUS) ×3 IMPLANT
PACK DR. KING ARMS (PACKS) ×3 IMPLANT
PACK EYE AFTER SURG (MISCELLANEOUS) ×3 IMPLANT
PACK OPTHALMIC (MISCELLANEOUS) ×3 IMPLANT
SYR 3ML LL SCALE MARK (SYRINGE) ×3 IMPLANT
SYR TB 1ML LUER SLIP (SYRINGE) ×3 IMPLANT
WATER STERILE IRR 500ML POUR (IV SOLUTION) ×3 IMPLANT
WIPE NON LINTING 3.25X3.25 (MISCELLANEOUS) ×3 IMPLANT

## 2018-11-16 NOTE — H&P (Signed)

## 2018-11-16 NOTE — Anesthesia Preprocedure Evaluation (Signed)
Anesthesia Evaluation  Patient identified by MRN, date of birth, ID band Patient awake    Reviewed: Allergy & Precautions, NPO status , Patient's Chart, lab work & pertinent test results  History of Anesthesia Complications Negative for: history of anesthetic complications  Airway Mallampati: II   Neck ROM: Full    Dental  (+)    Pulmonary neg pulmonary ROS,    Pulmonary exam normal breath sounds clear to auscultation       Cardiovascular hypertension, +CHF  Normal cardiovascular exam Rhythm:Regular Rate:Normal  LE lymphedema  ECG 08/08/17: Sinus bradycardia (HR 49) with sinus arrhythmia   Neuro/Psych negative neurological ROS     GI/Hepatic GERD  ,  Endo/Other  diabetes, Type 2  Renal/GU Renal disease (stage II CKD)     Musculoskeletal  (+) Arthritis ,   Abdominal   Peds  Hematology Breast CA   Anesthesia Other Findings   Reproductive/Obstetrics                             Anesthesia Physical  Anesthesia Plan  ASA: III  Anesthesia Plan: MAC   Post-op Pain Management:    Induction: Intravenous  PONV Risk Score and Plan: 2 and TIVA and Midazolam  Airway Management Planned: Natural Airway  Additional Equipment:   Intra-op Plan:   Post-operative Plan:   Informed Consent: I have reviewed the patients History and Physical, chart, labs and discussed the procedure including the risks, benefits and alternatives for the proposed anesthesia with the patient or authorized representative who has indicated his/her understanding and acceptance.       Plan Discussed with: CRNA  Anesthesia Plan Comments:         Anesthesia Quick Evaluation

## 2018-11-16 NOTE — Op Note (Signed)
OPERATIVE NOTE  [name] [id number] 11/16/2018   PREOPERATIVE DIAGNOSIS:  Nuclear sclerotic cataract right eye.  H25.11   POSTOPERATIVE DIAGNOSIS:    Nuclear sclerotic cataract right eye.     PROCEDURE:  Phacoemusification with posterior chamber intraocular lens placement of the right eye   LENS:   Implant Name Type Inv. Item Serial No. Manufacturer Lot No. LRB No. Used  LENS IOL DIOP 22.5 - [id number] Intraocular Lens LENS IOL DIOP 22.5 [id number]  Right 1       PCB00 +22.5   ULTRASOUND TIME: 1 minutes 16 seconds.  CDE 13.38   SURGEON:  Benay Pillow, MD, MPH  ANESTHESIOLOGIST: Anesthesiologist: Veda Canning, MD CRNA: Mayme Genta, CRNA; Cameron Ali, CRNA   ANESTHESIA:  Topical with tetracaine drops augmented with 1% preservative-free intracameral lidocaine.  ESTIMATED BLOOD LOSS: less than 1 mL.   COMPLICATIONS:  None.   DESCRIPTION OF PROCEDURE:  The patient was identified in the holding room and transported to the operating room and placed in the supine position under the operating microscope.  The right eye was identified as the operative eye and it was prepped and draped in the usual sterile ophthalmic fashion.   A 1.0 millimeter clear-corneal paracentesis was made at the 10:30 position. 0.5 ml of preservative-free 1% lidocaine with epinephrine was injected into the anterior chamber.  The anterior chamber was filled with Healon 5 viscoelastic.  A 2.4 millimeter keratome was used to make a near-clear corneal incision at the 8:00 position.  A curvilinear capsulorrhexis was made with a cystotome and capsulorrhexis forceps.  Balanced salt solution was used to hydrodissect and hydrodelineate the nucleus.   Phacoemulsification was then used in stop and chop fashion to remove the lens nucleus and epinucleus.  The remaining cortex was then removed using the irrigation and aspiration handpiece. Healon was then placed into the capsular bag to distend it for lens  placement.  A lens was then injected into the capsular bag.  The remaining viscoelastic was aspirated.   Wounds were hydrated with balanced salt solution.  The anterior chamber was inflated to a physiologic pressure with balanced salt solution.   Intracameral vigamox 0.1 mL undiluted was injected into the eye and a drop placed onto the ocular surface.  No wound leaks were noted.  The patient was taken to the recovery room in stable condition without complications of anesthesia or surgery  Benay Pillow 11/16/2018, 8:31 AM

## 2018-11-16 NOTE — Anesthesia Procedure Notes (Signed)
Procedure Name: MAC Performed by: Lashawne Dura, CRNA Pre-anesthesia Checklist: Patient identified, Emergency Drugs available, Suction available, Timeout performed and Patient being monitored Patient Re-evaluated:Patient Re-evaluated prior to induction Oxygen Delivery Method: Nasal cannula Placement Confirmation: positive ETCO2       

## 2018-11-16 NOTE — Transfer of Care (Signed)
Immediate Anesthesia Transfer of Care Note  Patient: [name]  Procedure(s) Performed: CATARACT EXTRACTION PHACO AND INTRAOCULAR LENS PLACEMENT (IOC)  RIGHT DIABETIC (Right Eye)  Patient Location: PACU  Anesthesia Type: MAC  Level of Consciousness: awake, alert  and patient cooperative  Airway and Oxygen Therapy: Patient Spontanous Breathing and Patient connected to supplemental oxygen  Post-op Assessment: Post-op Vital signs reviewed, Patient's Cardiovascular Status Stable, Respiratory Function Stable, Patent Airway and No signs of Nausea or vomiting  Post-op Vital Signs: Reviewed and stable  Complications: No apparent anesthesia complications

## 2018-11-16 NOTE — Anesthesia Postprocedure Evaluation (Signed)
Anesthesia Post Note  Patient: [name]  Procedure(s) Performed: CATARACT EXTRACTION PHACO AND INTRAOCULAR LENS PLACEMENT (IOC)  RIGHT DIABETIC (Right Eye)  Patient location during evaluation: PACU Anesthesia Type: MAC Level of consciousness: awake and alert Pain management: pain level controlled Vital Signs Assessment: post-procedure vital signs reviewed and stable Respiratory status: spontaneous breathing, nonlabored ventilation, respiratory function stable and patient connected to nasal cannula oxygen Cardiovascular status: stable and blood pressure returned to baseline Postop Assessment: no apparent nausea or vomiting Anesthetic complications: no    Veda Canning

## 2018-11-17 ENCOUNTER — Encounter: Payer: Self-pay | Admitting: Ophthalmology

## 2018-12-15 DIAGNOSIS — R7309 Other abnormal glucose: Secondary | ICD-10-CM | POA: Insufficient documentation

## 2019-05-03 ENCOUNTER — Other Ambulatory Visit: Payer: Self-pay | Admitting: Internal Medicine

## 2019-05-03 DIAGNOSIS — Z1231 Encounter for screening mammogram for malignant neoplasm of breast: Secondary | ICD-10-CM

## 2019-05-13 ENCOUNTER — Other Ambulatory Visit: Payer: Self-pay | Admitting: Internal Medicine

## 2019-05-13 DIAGNOSIS — D7389 Other diseases of spleen: Secondary | ICD-10-CM

## 2019-05-25 ENCOUNTER — Other Ambulatory Visit: Payer: Self-pay

## 2019-05-25 ENCOUNTER — Ambulatory Visit
Admission: RE | Admit: 2019-05-25 | Discharge: 2019-05-25 | Disposition: A | Discharge status: Home / Self Care | Payer: Medicare Other | Source: Ambulatory Visit | Attending: Internal Medicine | Admitting: Internal Medicine

## 2019-05-25 DIAGNOSIS — D7389 Other diseases of spleen: Secondary | ICD-10-CM | POA: Diagnosis not present

## 2019-05-25 LAB — POCT I-STAT CREATININE: Creatinine, Ser: 1.1 mg/dL — ABNORMAL HIGH (ref 0.44–1.00)

## 2019-05-25 MED ORDER — IOHEXOL 300 MG/ML  SOLN
100.0000 mL | Freq: Once | INTRAMUSCULAR | Status: AC | PRN
  Administered 2019-05-25: 10:00:00 100 mL via INTRAVENOUS

## 2019-07-12 ENCOUNTER — Other Ambulatory Visit: Payer: Self-pay

## 2019-07-12 ENCOUNTER — Ambulatory Visit (INDEPENDENT_AMBULATORY_CARE_PROVIDER_SITE_OTHER): Payer: Medicare Other | Admitting: Vascular Surgery

## 2019-07-12 ENCOUNTER — Encounter (INDEPENDENT_AMBULATORY_CARE_PROVIDER_SITE_OTHER): Payer: Self-pay

## 2019-07-12 ENCOUNTER — Encounter (INDEPENDENT_AMBULATORY_CARE_PROVIDER_SITE_OTHER): Payer: Self-pay | Admitting: Vascular Surgery

## 2019-07-12 VITALS — BP 167/84 | HR 64 | Resp 20 | Ht 71.0 in | Wt 278.0 lb

## 2019-07-12 DIAGNOSIS — I10 Essential (primary) hypertension: Secondary | ICD-10-CM

## 2019-07-12 DIAGNOSIS — E785 Hyperlipidemia, unspecified: Secondary | ICD-10-CM

## 2019-07-12 DIAGNOSIS — I5032 Chronic diastolic (congestive) heart failure: Secondary | ICD-10-CM | POA: Diagnosis not present

## 2019-07-12 DIAGNOSIS — I890 Lymphedema, not elsewhere classified: Secondary | ICD-10-CM

## 2019-07-12 DIAGNOSIS — E119 Type 2 diabetes mellitus without complications: Secondary | ICD-10-CM

## 2019-07-12 NOTE — Progress Notes (Signed)
MRN : [id number]  [name] is a 79 y.o. ([birth year]) female who presents with chief complaint of  Chief Complaint  Patient presents with  . Follow-up    1 year   .  History of Present Illness:    The patient returns to the office for followup evaluation regarding leg swelling.  The swelling has persisted but with the lymph pump the patient states the swelling is much better controlled. The pain associated with swelling is essentially eliminated. There have not been any interval development of a ulcerations or wounds.  No episodes of cellulitis or infection over the past 12 months  The patient denies problems with the pump, noting it is working but she has not been using it regularly.   Since the previous visit the patient has been wearing graduated compression stockings and using the lymph pump intermittently and  She notes significant improvement in the lymphedema when she is using compression and the pump.   Patient stated the lymph pump has been a very positive factor in her care.        Current Meds  Medication Sig  . acetaminophen (TYLENOL) 325 MG tablet Take 1,000 mg by mouth 2 (two) times daily.   Marland Kitchen aspirin EC 81 MG tablet Take 81 mg by mouth daily.  Marland Kitchen atorvastatin (LIPITOR) 20 MG tablet Take 20 mg by mouth at bedtime.   Marland Kitchen azithromycin (ZITHROMAX) 250 MG tablet Take by mouth 3 (three) times a week.  . ergocalciferol (VITAMIN D2) 1.25 MG (50000 UT) capsule Take 50,000 Units by mouth once a week.  . gabapentin (NEURONTIN) 300 MG capsule Take 300 mg by mouth at bedtime.  . magnesium oxide (MAG-OX) 400 MG tablet Take 400 mg by mouth daily.  Marland Kitchen nystatin cream (MYCOSTATIN) Apply 1 application topically 2 (two) times daily as needed. Apply thin film to yeast rash under the breasts   . Omega-3 Krill Oil 500 MG CAPS Take 500 mg by mouth 3 (three) times a week. On Monday Wednesday and Friday  . omeprazole (PRILOSEC) 20 MG capsule Take 20 mg by mouth daily. Acid reflux   .  sennosides-docusate sodium (SENOKOT-S) 8.6-50 MG tablet Take 2 tablets by mouth 2 (two) times daily.   . TOPROL XL 25 MG 24 hr tablet Take 25 mg by mouth at bedtime.   . triamterene-hydrochlorothiazide (DYAZIDE) 37.5-25 MG capsule Take 1 capsule by mouth daily.    Past Medical History:  Diagnosis Date  . Acquired hammertoe    other  . Acquired hammertoe   . Angina pectoris, unspecified ([place])   . Angina pectoris, unspecified ([place])   . Arthritis    knees  . Cancer ([place]) 1990,s   left breast  . CHF (congestive heart failure) ([place])   . Chronic kidney disease    stage 2  . Degenerative joint disease   . Dermatophytosis of nail   . Diabetes mellitus type 2, uncomplicated (HCC)    diet controlled  . Dyspnea   . Essential hypertension, benign   . Exostosis    unspecified site  . Fracture of right ankle 2010   Followed by Dr. Vickki Muff  . GERD (gastroesophageal reflux disease)   . Hemorrhoids   . Hypercholesterolemia   . Hyperlipidemia, unspecified   . Hypertension   . Lymphedema of leg    left leg worse than right leg.  . Obesity, unspecified   . Osteoarthrosis involving lower leg    unspecified whether generalized or localized   .  Pre-diabetes   . Pure hypercholesterolemia   . Vitamin D deficiency, unspecified   . Wears dentures    partial upper    Past Surgical History:  Procedure Laterality Date  . BREAST LUMPECTOMY Left   . BREAST SURGERY Left 1990   lumpectomy  . BUNIONECTOMY Bilateral 1990's   plus hammer toe repair on left foot.  Marland Kitchen CATARACT EXTRACTION W/PHACO Left 08/10/2018   Procedure: CATARACT EXTRACTION PHACO AND INTRAOCULAR LENS PLACEMENT ([place])  LEFT DIABETIC;  Surgeon: Eulogio Bear, MD;  Location: [facility];  Service: Ophthalmology;  Laterality: Left;  diabetic - diet controlled  . CATARACT EXTRACTION W/PHACO Right 11/16/2018   Procedure: CATARACT EXTRACTION PHACO AND INTRAOCULAR LENS PLACEMENT ([place])  RIGHT DIABETIC;  Surgeon: Eulogio Bear, MD;  Location: [facility];  Service: Ophthalmology;  Laterality: Right;  Diabetic - diet controlled  . COLONOSCOPY    . COLONOSCOPY WITH PROPOFOL N/A 05/08/2018   Procedure: COLONOSCOPY WITH PROPOFOL;  Surgeon: Lollie Sails, MD;  Location: [facility] ENDOSCOPY;  Service: Endoscopy;  Laterality: N/A;  . ENDOMETRIAL BIOPSY    . FOOT SURGERY Left    for bunionectomy and hammertoe  . JOINT REPLACEMENT    . TONSILLECTOMY    . TOTAL KNEE ARTHROPLASTY Left 04/08/2017   Procedure: TOTAL KNEE ARTHROPLASTY;  Surgeon: Corky Mull, MD;  Location: [facility];  Service: Orthopedics;  Laterality: Left;  . TOTAL KNEE ARTHROPLASTY Right 08/19/2017   Procedure: TOTAL KNEE ARTHROPLASTY;  Surgeon: Corky Mull, MD;  Location: [facility];  Service: Orthopedics;  Laterality: Right;    Social History Social History   Tobacco Use  . Smoking status: Never Smoker  . Smokeless tobacco: Never Used  Substance Use Topics  . Alcohol use: Yes    Comment: 1-2 drinks every 1-2 months  . Drug use: No    Family History Family History  Problem Relation Age of Onset  . Stroke Mother   . Hypertension Mother   . Cancer Father   . Prostate cancer Brother   . Kidney cancer Neg Hx   . Kidney disease Neg Hx     Allergies  Allergen Reactions  . Colesevelam Other (See Comments)    constipation  . Oxybutynin Swelling    Leg swelling  . Rosuvastatin Other (See Comments)    Pain in lower extremities     REVIEW OF SYSTEMS (Negative unless checked)  Constitutional: [] Weight loss  [] Fever  [] Chills Cardiac: [] Chest pain   [] Chest pressure   [] Palpitations   [] Shortness of breath when laying flat   [] Shortness of breath with exertion. Vascular:  [] Pain in legs with walking   [] Pain in legs at rest  [] History of DVT   [] Phlebitis   [x] Swelling in legs   [] Varicose veins   [] Non-healing ulcers Pulmonary:   [] Uses home oxygen   [] Productive cough   [] Hemoptysis   [] Wheeze  [] COPD   [] Asthma Neurologic:   [] Dizziness   [] Seizures   [] History of stroke   [] History of TIA  [] Aphasia   [] Vissual changes   [] Weakness or numbness in arm   [] Weakness or numbness in leg Musculoskeletal:   [] Joint swelling   [] Joint pain   [] Low back pain Hematologic:  [] Easy bruising  [] Easy bleeding   [] Hypercoagulable state   [] Anemic Gastrointestinal:  [] Diarrhea   [] Vomiting  [x] Gastroesophageal reflux/heartburn   [] Difficulty swallowing. Genitourinary:  [] Chronic kidney disease   [] Difficult urination  [] Frequent urination   [] Blood in urine Skin:  [] Rashes   []   Ulcers  Psychological:  [] History of anxiety   []  History of major depression.  Physical Examination  Vitals:   07/12/19 1129  BP: (!) 167/84  Pulse: 64  Resp: 20  Weight: 278 lb (126.1 kg)  Height: 5\' 11"  (1.803 m)   Body mass index is 38.77 kg/m. Gen: WD/WN, NAD Head: [place]/AT, No temporalis wasting.  Ear/Nose/Throat: Hearing grossly intact, nares w/o erythema or drainage Eyes: PER, EOMI, sclera nonicteric.  Neck: Supple, no large masses.   Pulmonary:  Good air movement, no audible wheezing bilaterally, no use of accessory muscles.  Cardiac: RRR, no JVD Vascular: scattered varicosities present bilaterally.  Moderate venous stasis changes to the legs bilaterally with a shiny.  2-3+ soft pitting edema Gastrointestinal: Non-distended. No guarding/no peritoneal signs.  Musculoskeletal: M/S 5/5 throughout.  No deformity or atrophy.  Neurologic: CN 2-12 intact. Symmetrical.  Speech is fluent. Motor exam as listed above. Psychiatric: Judgment intact, Mood & affect appropriate for pt's clinical situation. Dermatologic: No rashes or ulcers noted.  No changes consistent with cellulitis. Lymph : No lichenification or skin changes of chronic lymphedema.  CBC Lab Results  Component Value Date   WBC 4.9 06/11/2018   HGB 14.4 06/11/2018   HCT 44.9 06/11/2018   MCV 92.8 06/11/2018   PLT 177 06/11/2018    BMET    Component Value Date/Time   NA 134  (L) 08/22/2017 0601   K 4.2 08/22/2017 0601   CL 105 08/22/2017 0601   CO2 23 08/22/2017 0601   GLUCOSE 132 (H) 08/22/2017 0601   BUN 16 08/22/2017 0601   CREATININE 1.10 (H) 05/25/2019 1001   CALCIUM 9.2 08/22/2017 0601   GFRNONAA >60 08/22/2017 0601   GFRAA >60 08/22/2017 0601   CrCl cannot be calculated (Patient's most recent lab result is older than the maximum 21 days allowed.).  COAG Lab Results  Component Value Date   INR 1.00 08/08/2017   INR 1.01 03/12/2017    Radiology No results found.   Assessment/Plan 1. Lymphedema  No surgery or intervention at this point in time.    I have reviewed my discussion with the patient regarding lymphedema and why it  causes symptoms.  Patient will continue wearing graduated compression stockings class 1 (20-30 mmHg) on a daily basis a prescription was given. The patient is reminded to put the stockings on first thing in the morning and removing them in the evening. The patient is instructed specifically not to sleep in the stockings.   In addition, behavioral modification throughout the day will be continued.  This will include frequent elevation (such as in a recliner), use of over the counter pain medications as needed and exercise such as walking.  I have reviewed systemic causes for chronic edema such as liver, kidney and cardiac etiologies and there does not appear to be any significant changes in these organ systems over the past year.  The patient is under the impression that these organ systems are all stable and unchanged.    The patient will continue aggressive use of the  lymph pump.  This will continue to improve the edema control and prevent sequela such as ulcers and infections.   The patient will follow-up with me on an annual basis.    2. Hyperlipidemia, unspecified hyperlipidemia type Continue statin as ordered and reviewed, no changes at this time   3. Essential hypertension Continue antihypertensive medications  as already ordered, these medications have been reviewed and there are no changes at this time.  4. Chronic diastolic CHF (congestive heart failure) (HCC) Continue cardiac and antihypertensive medications as already ordered and reviewed, no changes at this time.  Continue statin as ordered and reviewed, no changes at this time  Nitrates PRN for chest pain   5. Type 2 diabetes, diet controlled ([place]) Continue hypoglycemic medications as already ordered, these medications have been reviewed and there are no changes at this time.  Hgb A1C to be monitored as already arranged by primary service    Hortencia Pilar, MD  07/12/2019 11:44 AM

## 2019-07-19 ENCOUNTER — Ambulatory Visit
Admission: RE | Admit: 2019-07-19 | Discharge: 2019-07-19 | Disposition: A | Discharge status: Home / Self Care | Payer: Medicare Other | Source: Ambulatory Visit | Attending: Internal Medicine | Admitting: Internal Medicine

## 2019-07-19 ENCOUNTER — Other Ambulatory Visit: Payer: Self-pay

## 2019-07-19 DIAGNOSIS — Z1231 Encounter for screening mammogram for malignant neoplasm of breast: Secondary | ICD-10-CM | POA: Diagnosis not present

## 2019-09-09 ENCOUNTER — Telehealth: Payer: Self-pay

## 2019-09-09 NOTE — Telephone Encounter (Signed)
Patient requested to be referred to [facility] in 07/2018 and saw them in 08/2018.  But she would like to come back and see Dr. Tasia [name].

## 2019-09-13 NOTE — Telephone Encounter (Signed)
Ok to schedule. I would like to see her and then decide what labs. Thanks.

## 2019-09-14 NOTE — Telephone Encounter (Signed)
Done.. MD appt has been scheduled as requested.. Pt is aware

## 2019-09-14 NOTE — Telephone Encounter (Signed)
Please schedule patient to see MD.  Thanks.

## 2019-09-22 ENCOUNTER — Inpatient Hospital Stay: Payer: Medicare Other | Attending: Oncology | Admitting: Oncology

## 2019-09-22 ENCOUNTER — Encounter: Payer: Self-pay | Admitting: Oncology

## 2019-09-22 VITALS — BP 155/64 | HR 60 | Temp 96.5°F | Resp 18 | Wt 271.8 lb

## 2019-09-22 DIAGNOSIS — R161 Splenomegaly, not elsewhere classified: Secondary | ICD-10-CM | POA: Diagnosis not present

## 2019-09-22 DIAGNOSIS — K219 Gastro-esophageal reflux disease without esophagitis: Secondary | ICD-10-CM | POA: Diagnosis not present

## 2019-09-22 DIAGNOSIS — K862 Cyst of pancreas: Secondary | ICD-10-CM | POA: Diagnosis not present

## 2019-09-22 DIAGNOSIS — Z79899 Other long term (current) drug therapy: Secondary | ICD-10-CM | POA: Insufficient documentation

## 2019-09-22 DIAGNOSIS — I130 Hypertensive heart and chronic kidney disease with heart failure and stage 1 through stage 4 chronic kidney disease, or unspecified chronic kidney disease: Secondary | ICD-10-CM | POA: Diagnosis not present

## 2019-09-22 DIAGNOSIS — J479 Bronchiectasis, uncomplicated: Secondary | ICD-10-CM | POA: Diagnosis not present

## 2019-09-22 DIAGNOSIS — E669 Obesity, unspecified: Secondary | ICD-10-CM | POA: Diagnosis not present

## 2019-09-22 DIAGNOSIS — D739 Disease of spleen, unspecified: Secondary | ICD-10-CM | POA: Insufficient documentation

## 2019-09-22 DIAGNOSIS — E785 Hyperlipidemia, unspecified: Secondary | ICD-10-CM | POA: Insufficient documentation

## 2019-09-22 DIAGNOSIS — I509 Heart failure, unspecified: Secondary | ICD-10-CM | POA: Insufficient documentation

## 2019-09-22 DIAGNOSIS — Z7982 Long term (current) use of aspirin: Secondary | ICD-10-CM | POA: Insufficient documentation

## 2019-09-22 DIAGNOSIS — E1122 Type 2 diabetes mellitus with diabetic chronic kidney disease: Secondary | ICD-10-CM | POA: Diagnosis not present

## 2019-09-22 DIAGNOSIS — N189 Chronic kidney disease, unspecified: Secondary | ICD-10-CM | POA: Diagnosis not present

## 2019-09-22 DIAGNOSIS — M1990 Unspecified osteoarthritis, unspecified site: Secondary | ICD-10-CM | POA: Insufficient documentation

## 2019-09-22 NOTE — Progress Notes (Signed)
Hematology/Oncology progress  note [facility] [phone] [phone]   Patient Care Team: Tracie Harrier, MD as PCP - General (Internal Medicine)  REFERRING PROVIDER: Tracie Harrier, MD REASON FOR VISIT:  Follow-up for spleen lesion.  HISTORY OF PRESENTING ILLNESS:  [name] is a  79 y.o.  female with PMH listed below who was referred to me for evaluation of splenomegaly.  Patient recently has CT chest w contrast done for work up of cough. CT chest showed bilateral bronchiectasis, lung nodules and thyroid nodules.. Incidental findings of ?kidney mass, not reported on radiologist. Subsequently CT abdomen pelvis was done which showed 2.3 lateral left upper pole renal cyst. Incidental finding of 4.9 cm rounded enhancing lesion in the anterior spleen, now from previous study in 2010. Patient was referred to [facility] for further evaluation.    INTERVAL HISTORY [name] is a 79 y.o. female who has above history reviewed by me today presents for follow up visit for management of spleen lesion.  Patient was last seen by me on 07/03/2018.  At that time patient wants to be referred to [facility] oncology for further evaluation.  Patient called and requested another appointment for discussion of her CT scan that was done in November 2020.  09/15/2018 PET scan at [facility] showed no focal lesions are discernible in the spleen. NO other hypermetabolic activity in chest abdomen pelvis to suggest malignancy.  Patient had a follow up CT done on 05/25/2019 CT abdomen pelvis w contrast showed indeterminate splenic lesion has no interval changes.  72m low attenudating lesion from the tai of the pancrease similar to prior CT, likely a side branch IPMN. Colonic diverticulosis She continues to have bilateral side pain. She takes tylenol. As needed.   Review of Systems  Constitutional: Negative for appetite change, chills, fatigue and fever.  HENT:   Negative  for hearing loss and voice change.   Eyes: Negative for eye problems.  Respiratory: Negative for chest tightness and cough.   Cardiovascular: Negative for chest pain.  Gastrointestinal: Negative for abdominal distention, abdominal pain and blood in stool.  Endocrine: Negative for hot flashes.  Genitourinary: Negative for difficulty urinating and frequency.   Musculoskeletal: Negative for arthralgias.  Skin: Negative for itching and rash.  Neurological: Negative for extremity weakness.  Hematological: Negative for adenopathy.  Psychiatric/Behavioral: Negative for confusion.    MEDICAL HISTORY:  Past Medical History:  Diagnosis Date  . Acquired hammertoe    other  . Acquired hammertoe   . Angina pectoris, unspecified ([place]   . Angina [place]is, unspecified ([place]   . Arthritis    knees  . [place]   left breast  . [place]   left breast  . CHF (congestive heart failure) ([place]   . Chronic kidney disease    stage 2  . Degenerative joint disease   . Dermatophytosis of nail   . Diabetes mellitus type 2, uncomplicated (HCC)    diet controlled  . Dyspnea   . Essential hypertension, benign   . Exostosis    unspecified site  . Fracture of right ankle 2010   Followed by Dr. FVickki Muff . GERD (gastroesophageal reflux disease)   . Hemorrhoids   . Hypercholesterolemia   . Hyperlipidemia, unspecified   . Hypertension   . Lymphedema of leg    left leg worse than right leg.  . Obesity, unspecified   . Osteoarthrosis involving lower leg    unspecified whether generalized or localized   .  Pre-diabetes   . Pure hypercholesterolemia   . Vitamin D deficiency, unspecified   . Wears dentures    partial upper    SURGICAL HISTORY: Past Surgical History:  Procedure Laterality Date  . BREAST LUMPECTOMY Left   . BREAST SURGERY Left 1990   lumpectomy  . BUNIONECTOMY Bilateral 1990's   plus hammer toe repair on left foot.  Marland Kitchen CATARACT EXTRACTION W/PHACO Left  08/10/2018   Procedure: CATARACT EXTRACTION PHACO AND INTRAOCULAR LENS PLACEMENT ([place])  LEFT DIABETIC;  Surgeon: Eulogio Bear, MD;  Location: [place];  Service: Ophthalmology;  Laterality: Left;  diabetic - diet controlled  . CATARACT EXTRACTION W/PHACO Right 11/16/2018   Procedure: CATARACT EXTRACTION PHACO AND INTRAOCULAR LENS PLACEMENT ([place])  RIGHT DIABETIC;  Surgeon: Eulogio Bear, MD;  Location: [place];  Service: Ophthalmology;  Laterality: Right;  Diabetic - diet controlled  . COLONOSCOPY    . COLONOSCOPY WITH PROPOFOL N/A 05/08/2018   Procedure: COLONOSCOPY WITH PROPOFOL;  Surgeon: Lollie Sails, MD;  Location[facility] ENDOSCOPY;  Service: Endoscopy;  Laterality: N/A;  . ENDOMETRIAL BIOPSY    . FOOT SURGERY Left    for bunionectomy and hammertoe  . JOINT REPLACEMENT    . TONSILLECTOMY    . TOTAL KNEE ARTHROPLASTY Left 04/08/2017   Procedure: TOTAL KNEE ARTHROPLASTY;  Surgeon: Corky Mull, MD;  Location: [facility];  Service: Orthopedics;  Laterality: Left;  . TOTAL KNEE ARTHROPLASTY Right 08/19/2017   Procedure: TOTAL KNEE ARTHROPLASTY;  Surgeon: Corky Mull, MD;  Location: [facility];  Service: Orthopedics;  Laterality: Right;    SOCIAL HISTORY: Social History   Socioeconomic History  . Marital status: Widowed    Spouse name: Not on file  . Number of children: 2  . Years of education: Not on file  . Highest education level: Not on file  Occupational History  . Occupation: Retired  Tobacco Use  . Smoking status: Never Smoker  . Smokeless tobacco: Never Used  Substance and Sexual Activity  . Alcohol use: Yes    Comment: 1-2 drinks every 1-2 months  . Drug use: No  . Sexual activity: Not on file  Other Topics Concern  . Not on file  Social History Narrative  . Not on file   Social Determinants of Health   Financial Resource Strain:   . Difficulty of Paying Living Expenses:   Food Insecurity:   . Worried About Charity fundraiser in  the Last Year:   . Arboriculturist in the Last Year:   Transportation Needs:   . Film/video editor (Medical):   Marland Kitchen Lack of Transportation (Non-Medical):   Physical Activity:   . Days of Exercise per Week:   . Minutes of Exercise per Session:   Stress:   . Feeling of Stress :   Social Connections:   . Frequency of Communication with Friends and Family:   . Frequency of Social Gatherings with Friends and Family:   . Attends Religious Services:   . Active Member of Clubs or Organizations:   . Attends Archivist Meetings:   Marland Kitchen Marital Status:   Intimate Partner Violence:   . Fear of Current or Ex-Partner:   . Emotionally Abused:   Marland Kitchen Physically Abused:   . Sexually Abused:     FAMILY HISTORY: Family History  Problem Relation Age of Onset  . Stroke Mother   . Hypertension Mother   . Cancer Father   . Prostate cancer Brother   .  Breast cancer Maternal Aunt 89  . Breast cancer Cousin        mat cousin  . Kidney cancer Neg Hx   . Kidney disease Neg Hx     ALLERGIES:  is allergic to colesevelam; oxybutynin; and rosuvastatin.  MEDICATIONS:  Current Outpatient Medications  Medication Sig Dispense Refill  . acetaminophen (TYLENOL) 325 MG tablet Take 1,000 mg by mouth 2 (two) times daily.     Marland Kitchen albuterol (PROVENTIL HFA;VENTOLIN HFA) 108 (90 Base) MCG/ACT inhaler Inhale 2 puffs into the lungs every 6 (six) hours as needed for wheezing or shortness of breath. 1 Inhaler 0  . aspirin EC 81 MG tablet Take 81 mg by mouth daily.    Marland Kitchen atorvastatin (LIPITOR) 20 MG tablet Take 20 mg by mouth at bedtime.     Marland Kitchen azithromycin (ZITHROMAX) 250 MG tablet Take by mouth 3 (three) times a week.    . ergocalciferol (VITAMIN D2) 1.25 MG (50000 UT) capsule Take 50,000 Units by mouth once a week.    . gabapentin (NEURONTIN) 300 MG capsule Take 300 mg by mouth at bedtime.    . hydrocortisone (ANUSOL-HC) 2.5 % rectal cream Place 1 application rectally 2 (two) times daily.    . magnesium oxide  (MAG-OX) 400 MG tablet Take 400 mg by mouth daily.    Marland Kitchen nystatin cream (MYCOSTATIN) Apply 1 application topically 2 (two) times daily as needed. Apply thin film to yeast rash under the breasts     . Omega-3 Krill Oil 500 MG CAPS Take 500 mg by mouth 3 (three) times a week. On Monday Wednesday and Friday    . omeprazole (PRILOSEC) 20 MG capsule Take 20 mg by mouth daily. Acid reflux     . potassium chloride (K-DUR,KLOR-CON) 10 MEQ tablet Take 10 mEq by mouth daily.     . sennosides-docusate sodium (SENOKOT-S) 8.6-50 MG tablet Take 2 tablets by mouth 2 (two) times daily.     . solifenacin (VESICARE) 5 MG tablet Take 5 mg by mouth daily.    . TOPROL XL 25 MG 24 hr tablet Take 25 mg by mouth at bedtime.     . traMADol (ULTRAM) 50 MG tablet Take 50 mg by mouth every 6 (six) hours as needed.    . triamterene-hydrochlorothiazide (DYAZIDE) 37.5-25 MG capsule Take 1 capsule by mouth daily.    . furosemide (LASIX) 40 MG tablet Take one tablet by mouth once daily as needed for lower extremity edema    . lovastatin (MEVACOR) 40 MG tablet Take 40 mg by mouth at bedtime.     No current facility-administered medications for this visit.     PHYSICAL EXAMINATION: ECOG PERFORMANCE STATUS: 1 - Symptomatic but completely ambulatory Vitals:   09/22/19 1036  BP: (!) 155/64  Pulse: 60  Resp: 18  Temp: (!) 96.5 F (35.8 C)   Filed Weights   09/22/19 1036  Weight: 271 lb 12.8 oz (123.3 kg)    Physical Exam Constitutional:      General: She is not in acute distress.    Appearance: She is obese.  HENT:     Head: Normocephalic and atraumatic.  Eyes:     General: No scleral icterus.    Pupils: Pupils are equal, round, and reactive to light.  Cardiovascular:     Rate and Rhythm: Normal rate and regular rhythm.     Heart sounds: Normal heart sounds.  Pulmonary:     Effort: Pulmonary effort is normal. No respiratory distress.  Breath sounds: No wheezing.  Abdominal:     General: Bowel sounds are  normal. There is no distension.     Palpations: Abdomen is soft. There is no mass.     Tenderness: There is no abdominal tenderness.  Musculoskeletal:        General: No deformity. Normal range of motion.     Cervical back: Normal range of motion and neck supple.  Skin:    General: Skin is warm and dry.     Findings: No erythema or rash.  Neurological:     Mental Status: She is alert and oriented to person, place, and time. Mental status is at baseline.     Cranial Nerves: No cranial nerve deficit.     Coordination: Coordination normal.  Psychiatric:        Mood and Affect: Mood normal.        Behavior: Behavior normal.        Thought Content: Thought content normal.      LABORATORY DATA:  I have reviewed the data as listed Lab Results  Component Value Date   WBC 4.9 06/11/2018   HGB 14.4 06/11/2018   HCT 44.9 06/11/2018   MCV 92.8 06/11/2018   PLT 177 06/11/2018   Recent Labs    05/25/19 1001  CREATININE 1.10*   Iron/TIBC/Ferritin/ %Sat No results found for: IRON, TIBC, FERRITIN, IRONPCTSAT   RADIOGRAPHIC STUDIES: I have personally reviewed the radiological images as listed and agreed with the findings in the report. CT abdomen pelvis with and without contrast 05/25/2018 showed 2.3 cm lateral left upper pole renal cyst.  4.9 cm rounded enhancing lesion in the anterior spleen, likely new from 2010, or at least significantly increased in size.  Lymphoma is possible given the progression.  A benign hematoma technically possible.  Unfortunately MR would be unable to definitively characterize this lesion.  Consider follow-up CT abdomen in 3 to 6 months.  Versus PET scan as clinically warranted.  ASSESSMENT & PLAN:  1. Pancreatic cyst   2. Cyst of pancreas    # Spleen lesion, Baseline work-up did not indicate an aggressive process. Pet scan showed no FDG activity and lesion size remains stable over 1 year.  Likely benign process.   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  I will obtain MRCP for further evaluation.   Check CEA and CA19.9  We spent sufficient time to discuss many aspect of care, questions were answered to patient's satisfaction.  All questions were answered. The patient knows to call the clinic with any problems questions or concerns.  Return of visit: after MRI.  ZEarlie Server MD, PhD Hematology Oncology CProvidence Holy Family Hospitalat ADesoto Surgicare Partners LtdPager- [phone]/24/2021

## 2019-09-22 NOTE — Progress Notes (Signed)
Patient requested a follow up appt with Dr. Tasia [name]. Pt reports she had CT scan done in November (ordered by Dr. Ginette Pitman) and there were 2 spots seen. Pt is concerned and would like for Dr. Tasia [name] to review scans.

## 2019-09-23 ENCOUNTER — Other Ambulatory Visit: Payer: Self-pay

## 2019-09-23 DIAGNOSIS — K862 Cyst of pancreas: Secondary | ICD-10-CM

## 2019-09-23 DIAGNOSIS — D7389 Other diseases of spleen: Secondary | ICD-10-CM

## 2019-09-28 ENCOUNTER — Inpatient Hospital Stay: Payer: Medicare Other

## 2019-09-28 DIAGNOSIS — D7389 Other diseases of spleen: Secondary | ICD-10-CM

## 2019-09-28 DIAGNOSIS — K862 Cyst of pancreas: Secondary | ICD-10-CM

## 2019-09-28 DIAGNOSIS — D739 Disease of spleen, unspecified: Secondary | ICD-10-CM | POA: Diagnosis not present

## 2019-09-29 LAB — CEA: CEA: 2 ng/mL (ref 0.0–4.7)

## 2019-09-29 LAB — CANCER ANTIGEN 19-9: CA 19-9: 11 U/mL (ref 0–35)

## 2019-10-12 ENCOUNTER — Other Ambulatory Visit: Payer: Self-pay | Admitting: Oncology

## 2019-10-12 ENCOUNTER — Ambulatory Visit
Admission: RE | Admit: 2019-10-12 | Discharge: 2019-10-12 | Disposition: A | Discharge status: Home / Self Care | Payer: Medicare Other | Source: Ambulatory Visit | Attending: Oncology | Admitting: Oncology

## 2019-10-12 ENCOUNTER — Other Ambulatory Visit: Payer: Self-pay

## 2019-10-12 DIAGNOSIS — K862 Cyst of pancreas: Secondary | ICD-10-CM

## 2019-10-12 MED ORDER — GADOBUTROL 1 MMOL/ML IV SOLN
10.0000 mL | Freq: Once | INTRAVENOUS | Status: AC | PRN
  Administered 2019-10-12: 10 mL via INTRAVENOUS

## 2019-10-14 ENCOUNTER — Encounter: Payer: Self-pay | Admitting: Oncology

## 2019-10-14 ENCOUNTER — Inpatient Hospital Stay: Payer: Medicare Other | Attending: Oncology | Admitting: Oncology

## 2019-10-14 ENCOUNTER — Other Ambulatory Visit: Payer: Self-pay

## 2019-10-14 VITALS — BP 125/76 | HR 54 | Temp 96.5°F | Resp 18 | Wt 274.4 lb

## 2019-10-14 DIAGNOSIS — R161 Splenomegaly, not elsewhere classified: Secondary | ICD-10-CM | POA: Diagnosis not present

## 2019-10-14 DIAGNOSIS — D7389 Other diseases of spleen: Secondary | ICD-10-CM

## 2019-10-14 DIAGNOSIS — K862 Cyst of pancreas: Secondary | ICD-10-CM | POA: Insufficient documentation

## 2019-10-14 NOTE — Progress Notes (Signed)
Pt here for MRI results. No concerns voiced.

## 2019-10-14 NOTE — Progress Notes (Signed)
Hematology/Oncology progress  note [facility] [phone] [phone]   Patient Care Team: Center, [facility] as PCP - General (General Practice)  REFERRING PROVIDER: [facility], [place] VISIT:  Follow-up for spleen lesion.  HISTORY OF PRESENTING ILLNESS:  [name] is a  79 y.o.  female with PMH listed below who was referred to me for evaluation of splenomegaly.  Patient recently has CT chest w contrast done for work up of cough. CT chest showed bilateral bronchiectasis, lung nodules and thyroid nodules.. Incidental findings of ?kidney mass, not reported on radiologist. Subsequently CT abdomen pelvis was done which showed 2.3 lateral left upper pole renal cyst. Incidental finding of 4.9 cm rounded enhancing lesion in the anterior spleen, now from previous study in 2010. Patient was referred to [facility] for further evaluation.   #  seen by me on 07/03/2018.  At that time patient wants to be referred to [facility] oncology for further evaluation.  Patient called and requested another appointment for discussion of her CT scan that was done in November 2020.  09/15/2018 PET scan at [facility] showed no focal lesions are discernible in the spleen. NO other hypermetabolic activity in chest abdomen pelvis to suggest malignancy.  Patient had a follow up CT done on 05/25/2019 CT abdomen pelvis w contrast showed indeterminate splenic lesion has no interval changes.  12m low attenudating lesion from the tai of the pancrease similar to prior CT, likely a side branch IPMN. Colonic diverticulosis  INTERVAL HISTORY Wanda CLENNEYis a 79y.o. female who has above history reviewed by me today presents for follow up visit for spleen lesion and pancreatic lesion.  She had MRI done. No new complaints today. Presents to discuss MRI results.   Review of Systems  Constitutional: Negative for appetite change, chills, fatigue  and fever.  HENT:   Negative for hearing loss and voice change.   Eyes: Negative for eye problems.  Respiratory: Negative for chest tightness and cough.   Cardiovascular: Negative for chest pain.  Gastrointestinal: Negative for abdominal distention, abdominal pain and blood in stool.  Endocrine: Negative for hot flashes.  Genitourinary: Negative for difficulty urinating and frequency.   Musculoskeletal: Negative for arthralgias.  Skin: Negative for itching and rash.  Neurological: Negative for extremity weakness.  Hematological: Negative for adenopathy.  Psychiatric/Behavioral: Negative for confusion.    MEDICAL HISTORY:  Past Medical History:  Diagnosis Date  . Acquired hammertoe    other  . Acquired hammertoe   . [place]is, unspecified ([place]   . Angina [place]is, unspecified ([place]   . Arthritis    knees  . Breast cancer ([place] 1990   left breast  . Cancer ([place],s   left breast  . CHF (congestive heart failure) ([place]   . Chronic kidney disease    stage 2  . Degenerative joint disease   . Dermatophytosis of nail   . Diabetes mellitus type 2, uncomplicated (HCC)    diet controlled  . Dyspnea   . Essential hypertension, benign   . Exostosis    unspecified site  . Fracture of right ankle 2010   Followed by Dr. FVickki Muff . GERD (gastroesophageal reflux disease)   . Hemorrhoids   . Hypercholesterolemia   . Hyperlipidemia, unspecified   . Hypertension   . Lymphedema of leg    left leg worse than right leg.  . Obesity, unspecified   . Osteoarthrosis involving lower leg  unspecified whether generalized or localized   . Pre-diabetes   . Pure hypercholesterolemia   . Vitamin D deficiency, unspecified   . Wears dentures    partial upper    SURGICAL HISTORY: Past Surgical History:  Procedure Laterality Date  . BREAST LUMPECTOMY Left   . BREAST SURGERY Left 1990   lumpectomy  . BUNIONECTOMY Bilateral 1990's   plus hammer toe repair on left foot.  Marland Kitchen  CATARACT EXTRACTION W/PHACO Left 08/10/2018   Procedure: CATARACT EXTRACTION PHACO AND INTRAOCULAR LENS PLACEMENT ([place])  LEFT DIABETIC;  Surgeon: Eulogio Bear, MD;  Location: [place];  Service: Ophthalmology;  Laterality: Left;  diabetic - diet controlled  . CATARACT EXTRACTION W/PHACO Right 11/16/2018   Procedure: CATARACT EXTRACTION PHACO AND INTRAOCULAR LENS PLACEMENT ([place])  RIGHT DIABETIC;  Surgeon: Eulogio Bear, MD;  Location: [place];  Service: Ophthalmology;  Laterality: Right;  Diabetic - diet controlled  . COLONOSCOPY    . COLONOSCOPY WITH PROPOFOL N/A 05/08/2018   Procedure: COLONOSCOPY WITH PROPOFOL;  Surgeon: Lollie Sails, MD;  Location: [facility] ENDOSCOPY;  Service: Endoscopy;  Laterality: N/A;  . ENDOMETRIAL BIOPSY    . FOOT SURGERY Left    for bunionectomy and hammertoe  . JOINT REPLACEMENT    . TONSILLECTOMY    . TOTAL KNEE ARTHROPLASTY Left 04/08/2017   Procedure: TOTAL KNEE ARTHROPLASTY;  Surgeon: Corky Mull, MD;  Location: [facility];  Service: Orthopedics;  Laterality: Left;  . TOTAL KNEE ARTHROPLASTY Right 08/19/2017   Procedure: TOTAL KNEE ARTHROPLASTY;  Surgeon: Corky Mull, MD;  Location: [facility];  Service: Orthopedics;  Laterality: Right;    SOCIAL HISTORY: Social History   Socioeconomic History  . Marital status: Widowed    Spouse name: Not on file  . Number of children: 2  . Years of education: Not on file  . Highest education level: Not on file  Occupational History  . Occupation: Retired  Tobacco Use  . Smoking status: Never Smoker  . Smokeless tobacco: Never Used  Substance and Sexual Activity  . Alcohol use: Yes    Comment: 1-2 drinks every 1-2 months  . Drug use: No  . Sexual activity: Not on file  Other Topics Concern  . Not on file  Social History Narrative  . Not on file   Social Determinants of Health   Financial Resource Strain:   . Difficulty of Paying Living Expenses:   Food Insecurity:   .  Worried About Charity fundraiser in the Last Year:   . Arboriculturist in the Last Year:   Transportation Needs:   . Film/video editor (Medical):   Marland Kitchen Lack of Transportation (Non-Medical):   Physical Activity:   . Days of Exercise per Week:   . Minutes of Exercise per Session:   Stress:   . Feeling of Stress :   Social Connections:   . Frequency of Communication with Friends and Family:   . Frequency of Social Gatherings with Friends and Family:   . Attends Religious Services:   . Active Member of Clubs or Organizations:   . Attends Archivist Meetings:   Marland Kitchen Marital Status:   Intimate Partner Violence:   . Fear of Current or Ex-Partner:   . Emotionally Abused:   Marland Kitchen Physically Abused:   . Sexually Abused:     FAMILY HISTORY: Family History  Problem Relation Age of Onset  . Stroke Mother   . Hypertension Mother   .  Cancer Father   . Prostate cancer Brother   . Breast cancer Maternal Aunt 89  . Breast cancer Cousin        mat cousin  . Kidney cancer Neg Hx   . Kidney disease Neg Hx     ALLERGIES:  is allergic to colesevelam; oxybutynin; and rosuvastatin.  MEDICATIONS:  Current Outpatient Medications  Medication Sig Dispense Refill  . acetaminophen (TYLENOL) 325 MG tablet Take 1,000 mg by mouth 2 (two) times daily.     Marland Kitchen aspirin EC 81 MG tablet Take 81 mg by mouth daily.    Marland Kitchen atorvastatin (LIPITOR) 20 MG tablet Take 20 mg by mouth at bedtime.     Marland Kitchen azithromycin (ZITHROMAX) 250 MG tablet Take by mouth 3 (three) times a week.    . ergocalciferol (VITAMIN D2) 1.25 MG (50000 UT) capsule Take 50,000 Units by mouth once a week.    . gabapentin (NEURONTIN) 300 MG capsule Take 300 mg by mouth at bedtime.    . hydrocortisone (ANUSOL-HC) 2.5 % rectal cream Place 1 application rectally 2 (two) times daily.    . magnesium oxide (MAG-OX) 400 MG tablet Take 800 mg by mouth daily.     Marland Kitchen nystatin cream (MYCOSTATIN) Apply 1 application topically 2 (two) times daily as  needed. Apply thin film to yeast rash under the breasts     . Omega-3 Krill Oil 500 MG CAPS Take 500 mg by mouth 3 (three) times a week. On Monday Wednesday and Friday    . omeprazole (PRILOSEC) 20 MG capsule Take 20 mg by mouth daily. Acid reflux     . sennosides-docusate sodium (SENOKOT-S) 8.6-50 MG tablet Take 2 tablets by mouth 2 (two) times daily.     . solifenacin (VESICARE) 5 MG tablet Take 5 mg by mouth daily.    . TOPROL XL 25 MG 24 hr tablet Take 25 mg by mouth at bedtime.     . triamterene-hydrochlorothiazide (DYAZIDE) 37.5-25 MG capsule Take 1 capsule by mouth daily.    Marland Kitchen albuterol (PROVENTIL HFA;VENTOLIN HFA) 108 (90 Base) MCG/ACT inhaler Inhale 2 puffs into the lungs every 6 (six) hours as needed for wheezing or shortness of breath. (Patient not taking: Reported on 10/14/2019) 1 Inhaler 0  . furosemide (LASIX) 40 MG tablet Take one tablet by mouth once daily as needed for lower extremity edema    . lovastatin (MEVACOR) 40 MG tablet Take 40 mg by mouth at bedtime.    . potassium chloride (K-DUR,KLOR-CON) 10 MEQ tablet Take 10 mEq by mouth daily.     . traMADol (ULTRAM) 50 MG tablet Take 50 mg by mouth every 6 (six) hours as needed.     No current facility-administered medications for this visit.     PHYSICAL EXAMINATION: ECOG PERFORMANCE STATUS: 1 - Symptomatic but completely ambulatory Vitals:   10/14/19 1031  BP: 125/76  Pulse: (!) 54  Resp: 18  Temp: (!) 96.5 F (35.8 C)   Filed Weights   10/14/19 1031  Weight: 274 lb 6.4 oz (124.5 kg)    Physical Exam Constitutional:      General: She is not in acute distress.    Appearance: She is obese.  HENT:     Head: Normocephalic and atraumatic.  Eyes:     General: No scleral icterus.    Pupils: Pupils are equal, round, and reactive to light.  Cardiovascular:     Rate and Rhythm: Normal rate and regular rhythm.     Heart sounds: Normal heart  sounds.  Pulmonary:     Effort: Pulmonary effort is normal. No respiratory  distress.     Breath sounds: No wheezing.  Abdominal:     General: Bowel sounds are normal. There is no distension.     Palpations: Abdomen is soft. There is no mass.     Tenderness: There is no abdominal tenderness.  Musculoskeletal:        General: No deformity. Normal range of motion.     Cervical back: Normal range of motion and neck supple.  Skin:    General: Skin is warm and dry.     Findings: No erythema or rash.  Neurological:     Mental Status: She is alert and oriented to person, place, and time. Mental status is at baseline.     Cranial Nerves: No cranial nerve deficit.     Coordination: Coordination normal.  Psychiatric:        Mood and Affect: Mood normal.        Behavior: Behavior normal.        Thought Content: Thought content normal.      LABORATORY DATA:  I have reviewed the data as listed Lab Results  Component Value Date   WBC 4.9 06/11/2018   HGB 14.4 06/11/2018   HCT 44.9 06/11/2018   MCV 92.8 06/11/2018   PLT 177 06/11/2018   Recent Labs    05/25/19 1001  CREATININE 1.10*   Iron/TIBC/Ferritin/ %Sat No results found for: IRON, TIBC, FERRITIN, IRONPCTSAT   RADIOGRAPHIC STUDIES: I have personally reviewed the radiological images as listed and agreed with the findings in the report. MR 3D Recon At Scanner  Result Date: 10/12/2019 CLINICAL DATA:  Pancreatic cystic lesion follow up EXAM: MRI ABDOMEN WITHOUT AND WITH CONTRAST (INCLUDING MRCP) TECHNIQUE: Multiplanar multisequence MR imaging of the abdomen was performed both before and after the administration of intravenous contrast. Heavily T2-weighted images of the biliary and pancreatic ducts were obtained, and three-dimensional MRCP images were rendered by post processing. CONTRAST:  88m GADAVIST GADOBUTROL 1 MMOL/ML IV SOLN COMPARISON:  05/25/2019 FINDINGS: Lower chest: Mild cardiomegaly. Hepatobiliary: No significant hepatic abnormality is identified. No biliary dilatation. Gallbladder appears  unremarkable. Pancreas: Cystic lesion along the posterior margin of the tip of the tail the pancreas measures 1.7 by 1.4 by 1.2 cm and is immediately adjacent to an adjacent small cystic lesion with single septation measuring 1.3 by 0.8 by 1.5 cm. No significant associated internal nodular enhancement. Early traces of this lesion were likely present on the CT scan from 03/06/2007, measuring up to 1.0 cm in long axis. On the more recent CT scan from 05/25/2019, the dominant cystic lesion measures 1.5 by 1.2 by 1.4 cm. This does not constitute interval growth based on formal definitions. No dorsal pancreatic duct dilatation. Spleen: Suspected hamartoma anteriorly in the spleen measuring approximately 5.3 by 4.3 cm. Adrenals/Urinary Tract: The adrenal glands appear normal. Simple appearing left kidney upper pole cyst. No clinically significant renal abnormality is observed. Stomach/Bowel: Unremarkable Vascular/Lymphatic:  Aortoiliac atherosclerotic vascular disease. Other:  No supplemental non-categorized findings. Musculoskeletal: Lower thoracic and lumbar degenerative disc disease. IMPRESSION: 1. 1.7 by 1.4 by 1.2 cm cystic lesion along the posterior margin of the tip of the pancreas. 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 Based on current consensus guidelines, either endoscopic ultrasound/fine-needle aspiration or reimaging in 6 months time by pancreatic protocol MRI is  recommended. This recommendation follows ACR consensus guidelines: Management of Incidental Pancreatic Cysts: A White Paper of the ACR Incidental Findings Committee. J Am Coll Radiol 8099;83:382-505. 2. 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 3. Mild cardiomegaly. 4. Lower thoracic and lumbar degenerative disc disease. Electronically Signed   By: Van Clines M.D.   On: 10/12/2019 15:06   MR  ABDOMEN MRCP W WO CONTAST  Result Date: 10/12/2019 CLINICAL DATA:  Pancreatic cystic lesion follow up EXAM: MRI ABDOMEN WITHOUT AND WITH CONTRAST (INCLUDING MRCP) TECHNIQUE: Multiplanar multisequence MR imaging of the abdomen was performed both before and after the administration of intravenous contrast. Heavily T2-weighted images of the biliary and pancreatic ducts were obtained, and three-dimensional MRCP images were rendered by post processing. CONTRAST:  24m GADAVIST GADOBUTROL 1 MMOL/ML IV SOLN COMPARISON:  05/25/2019 FINDINGS: Lower chest: Mild cardiomegaly. Hepatobiliary: No significant hepatic abnormality is identified. No biliary dilatation. Gallbladder appears unremarkable. Pancreas: Cystic lesion along the posterior margin of the tip of the tail the pancreas measures 1.7 by 1.4 by 1.2 cm and is immediately adjacent to an adjacent small cystic lesion with single septation measuring 1.3 by 0.8 by 1.5 cm. No significant associated internal nodular enhancement. Early traces of this lesion were likely present on the CT scan from 03/06/2007, measuring up to 1.0 cm in long axis. On the more recent CT scan from 05/25/2019, the dominant cystic lesion measures 1.5 by 1.2 by 1.4 cm. This does not constitute interval growth based on formal definitions. No dorsal pancreatic duct dilatation. Spleen: Suspected hamartoma anteriorly in the spleen measuring approximately 5.3 by 4.3 cm. Adrenals/Urinary Tract: The adrenal glands appear normal. Simple appearing left kidney upper pole cyst. No clinically significant renal abnormality is observed. Stomach/Bowel: Unremarkable Vascular/Lymphatic:  Aortoiliac atherosclerotic vascular disease. Other:  No supplemental non-categorized findings. Musculoskeletal: Lower thoracic and lumbar degenerative disc disease. IMPRESSION: 1. 1.7 by 1.4 by 1.2 cm cystic lesion along the posterior margin of the tip of the pancreas. 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 Based on current consensus guidelines, either endoscopic ultrasound/fine-needle aspiration or reimaging in 6 months time by pancreatic protocol MRI is recommended. This recommendation follows ACR consensus guidelines: Management of Incidental Pancreatic Cysts: A White Paper of the ACR Incidental Findings Committee. J Am Coll Radiol 23976;73:419-379 2. 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 3. Mild cardiomegaly. 4. Lower thoracic and lumbar degenerative disc disease. Electronically Signed   By: WVan ClinesM.D.   On: 10/12/2019 15:06   MM 3D SCREEN BREAST BILATERAL  Result Date: 07/20/2019 CLINICAL DATA:  Screening. EXAM: DIGITAL SCREENING BILATERAL MAMMOGRAM WITH TOMO AND CAD COMPARISON:  Previous exam(s). ACR Breast Density Category b: There are scattered areas of fibroglandular density. FINDINGS: There are no findings suspicious for malignancy. Images were processed with CAD. IMPRESSION: No mammographic evidence of malignancy. A result letter of this screening mammogram will be mailed directly to the patient. RECOMMENDATION: Screening mammogram in one year. (Code:SM-B-01Y) BI-RADS CATEGORY  1: Negative. Electronically Signed   By: MAmmie FerrierM.D.   On: 07/20/2019 13:42    ASSESSMENT & PLAN:  1. Pancreatic cyst   2. 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  Side branch IPMN, if size continues to  grow, may need to be resected. I will refer patient to establish care with [facility] pancreatic surgeon for an evaluation.   # Spleen lesion, Baseline work-up did not indicate an aggressive process. Previous scan showed no FDG activity and lesion size remains stable over 1 year.  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    Normal  CEA and CA19.9  We spent sufficient time to discuss many aspect of care, questions were answered to patient's satisfaction.  All questions were answered. The patient knows to call the clinic with any problems questions or concerns.  Return of visit:  6 months.  Earlie Server, MD, PhD Hematology Oncology [facility] at [facility] Pager- [phone] 10/14/2019

## 2020-04-14 ENCOUNTER — Ambulatory Visit
Admission: RE | Admit: 2020-04-14 | Discharge: 2020-04-14 | Disposition: A | Discharge status: Home / Self Care | Payer: Medicare Other | Source: Ambulatory Visit | Attending: Oncology | Admitting: Oncology

## 2020-04-14 ENCOUNTER — Other Ambulatory Visit: Payer: Self-pay

## 2020-04-14 ENCOUNTER — Inpatient Hospital Stay: Payer: Medicare Other | Attending: Oncology

## 2020-04-14 ENCOUNTER — Other Ambulatory Visit: Payer: Self-pay | Admitting: Oncology

## 2020-04-14 DIAGNOSIS — K219 Gastro-esophageal reflux disease without esophagitis: Secondary | ICD-10-CM | POA: Diagnosis not present

## 2020-04-14 DIAGNOSIS — M1909 Primary osteoarthritis, other specified site: Secondary | ICD-10-CM | POA: Diagnosis not present

## 2020-04-14 DIAGNOSIS — E7800 Pure hypercholesterolemia, unspecified: Secondary | ICD-10-CM | POA: Insufficient documentation

## 2020-04-14 DIAGNOSIS — E559 Vitamin D deficiency, unspecified: Secondary | ICD-10-CM | POA: Diagnosis not present

## 2020-04-14 DIAGNOSIS — R161 Splenomegaly, not elsewhere classified: Secondary | ICD-10-CM | POA: Insufficient documentation

## 2020-04-14 DIAGNOSIS — Z7982 Long term (current) use of aspirin: Secondary | ICD-10-CM | POA: Diagnosis not present

## 2020-04-14 DIAGNOSIS — R911 Solitary pulmonary nodule: Secondary | ICD-10-CM | POA: Diagnosis not present

## 2020-04-14 DIAGNOSIS — D7389 Other diseases of spleen: Secondary | ICD-10-CM | POA: Diagnosis present

## 2020-04-14 DIAGNOSIS — Z79899 Other long term (current) drug therapy: Secondary | ICD-10-CM | POA: Insufficient documentation

## 2020-04-14 DIAGNOSIS — E785 Hyperlipidemia, unspecified: Secondary | ICD-10-CM | POA: Diagnosis not present

## 2020-04-14 DIAGNOSIS — I509 Heart failure, unspecified: Secondary | ICD-10-CM | POA: Diagnosis not present

## 2020-04-14 DIAGNOSIS — D378 Neoplasm of uncertain behavior of other specified digestive organs: Secondary | ICD-10-CM | POA: Diagnosis not present

## 2020-04-14 DIAGNOSIS — K862 Cyst of pancreas: Secondary | ICD-10-CM

## 2020-04-14 DIAGNOSIS — N182 Chronic kidney disease, stage 2 (mild): Secondary | ICD-10-CM | POA: Insufficient documentation

## 2020-04-14 DIAGNOSIS — E1122 Type 2 diabetes mellitus with diabetic chronic kidney disease: Secondary | ICD-10-CM | POA: Diagnosis not present

## 2020-04-14 DIAGNOSIS — R634 Abnormal weight loss: Secondary | ICD-10-CM | POA: Insufficient documentation

## 2020-04-14 DIAGNOSIS — I130 Hypertensive heart and chronic kidney disease with heart failure and stage 1 through stage 4 chronic kidney disease, or unspecified chronic kidney disease: Secondary | ICD-10-CM | POA: Diagnosis not present

## 2020-04-14 DIAGNOSIS — Z853 Personal history of malignant neoplasm of breast: Secondary | ICD-10-CM | POA: Diagnosis not present

## 2020-04-14 DIAGNOSIS — R630 Anorexia: Secondary | ICD-10-CM | POA: Diagnosis not present

## 2020-04-14 DIAGNOSIS — E669 Obesity, unspecified: Secondary | ICD-10-CM | POA: Diagnosis not present

## 2020-04-14 DIAGNOSIS — N281 Cyst of kidney, acquired: Secondary | ICD-10-CM | POA: Insufficient documentation

## 2020-04-14 DIAGNOSIS — I209 Angina pectoris, unspecified: Secondary | ICD-10-CM | POA: Insufficient documentation

## 2020-04-14 DIAGNOSIS — I129 Hypertensive chronic kidney disease with stage 1 through stage 4 chronic kidney disease, or unspecified chronic kidney disease: Secondary | ICD-10-CM | POA: Insufficient documentation

## 2020-04-14 LAB — COMPREHENSIVE METABOLIC PANEL
ALT: 13 U/L (ref 0–44)
AST: 17 U/L (ref 15–41)
Albumin: 4.1 g/dL (ref 3.5–5.0)
Alkaline Phosphatase: 89 U/L (ref 38–126)
Anion gap: 7 (ref 5–15)
BUN: 22 mg/dL (ref 8–23)
CO2: 26 mmol/L (ref 22–32)
Calcium: 9.7 mg/dL (ref 8.9–10.3)
Chloride: 108 mmol/L (ref 98–111)
Creatinine, Ser: 1.28 mg/dL — ABNORMAL HIGH (ref 0.44–1.00)
GFR, Estimated: 39 mL/min — ABNORMAL LOW (ref >60)
Glucose, Bld: 109 mg/dL — ABNORMAL HIGH (ref 70–99)
Potassium: 4.1 mmol/L (ref 3.5–5.1)
Sodium: 141 mmol/L (ref 135–145)
Total Bilirubin: 0.9 mg/dL (ref 0.3–1.2)
Total Protein: 7.3 g/dL (ref 6.5–8.1)

## 2020-04-14 LAB — CBC WITH DIFFERENTIAL/PLATELET
Abs Immature Granulocytes: 0.02 10*3/uL (ref 0.00–0.07)
Basophils Absolute: 0 10*3/uL (ref 0.0–0.1)
Basophils Relative: 1 %
Eosinophils Absolute: 0.1 10*3/uL (ref 0.0–0.5)
Eosinophils Relative: 3 %
HCT: 44.5 % (ref 36.0–46.0)
Hemoglobin: 14.7 g/dL (ref 12.0–15.0)
Immature Granulocytes: 0 %
Lymphocytes Relative: 34 %
Lymphs Abs: 1.8 10*3/uL (ref 0.7–4.0)
MCH: 30.3 pg (ref 26.0–34.0)
MCHC: 33 g/dL (ref 30.0–36.0)
MCV: 91.8 fL (ref 80.0–100.0)
Monocytes Absolute: 0.5 10*3/uL (ref 0.1–1.0)
Monocytes Relative: 9 %
Neutro Abs: 2.8 10*3/uL (ref 1.7–7.7)
Neutrophils Relative %: 53 %
Platelets: 164 10*3/uL (ref 150–400)
RBC: 4.85 MIL/uL (ref 3.87–5.11)
RDW: 12.7 % (ref 11.5–15.5)
WBC: 5.2 10*3/uL (ref 4.0–10.5)
nRBC: 0 % (ref 0.0–0.2)

## 2020-04-14 MED ORDER — GADOBUTROL 1 MMOL/ML IV SOLN
10.0000 mL | Freq: Once | INTRAVENOUS | Status: AC | PRN
  Administered 2020-04-14: 10 mL via INTRAVENOUS

## 2020-04-15 LAB — CEA: CEA: 1.6 ng/mL (ref 0.0–4.7)

## 2020-04-15 LAB — CANCER ANTIGEN 19-9: CA 19-9: 10 U/mL (ref 0–35)

## 2020-04-17 ENCOUNTER — Inpatient Hospital Stay (HOSPITAL_BASED_OUTPATIENT_CLINIC_OR_DEPARTMENT_OTHER): Payer: Medicare Other | Admitting: Oncology

## 2020-04-17 ENCOUNTER — Encounter: Payer: Self-pay | Admitting: Oncology

## 2020-04-17 ENCOUNTER — Other Ambulatory Visit: Payer: Self-pay

## 2020-04-17 VITALS — BP 146/73 | HR 51 | Temp 95.9°F | Resp 18 | Wt 264.8 lb

## 2020-04-17 DIAGNOSIS — R911 Solitary pulmonary nodule: Secondary | ICD-10-CM | POA: Diagnosis not present

## 2020-04-17 DIAGNOSIS — K862 Cyst of pancreas: Secondary | ICD-10-CM

## 2020-04-17 DIAGNOSIS — D7389 Other diseases of spleen: Secondary | ICD-10-CM | POA: Diagnosis not present

## 2020-04-17 NOTE — Progress Notes (Signed)
Hematology/Oncology progress  note [facility] [phone] [phone]   Patient Care Team: [place] as PCP - General  REFERRING PROVIDER: [place] REASON FOR VISIT:  Follow-up for spleen lesion.  HISTORY OF PRESENTING ILLNESS:  Wanda [name] is a  79 y.o.  [name] with PMH listed below who was referred to me for evaluation of splenomegaly.  Patient recently has CT chest w contrast done for work up of cough. CT chest showed bilateral bronchiectasis, lung nodules and thyroid nodules.. Incidental findings of ?kidney mass, not reported on radiologist. Subsequently CT abdomen pelvis was done which showed 2.3 lateral left upper pole renal cyst. Incidental finding of 4.9 cm rounded enhancing lesion in the anterior spleen, now from previous study in 2010. Patient was referred to [facility] for further evaluation.   #  seen by me on 07/03/2018.  At that time patient wants to be referred to [facility] oncology for further evaluation.  Patient called and requested another appointment for discussion of her CT scan that was done in November 2020.  09/15/2018 PET scan at [facility] showed no focal lesions are discernible in the spleen. NO other hypermetabolic activity in chest abdomen pelvis to suggest malignancy.  Patient had a follow up CT done on 05/25/2019 CT abdomen pelvis w contrast showed indeterminate splenic lesion has no interval changes.  4m low attenudating lesion from the tai of the pancrease similar to prior CT, likely a side branch IPMN. Colonic diverticulosis  INTERVAL HISTORY Wanda [name] a 79y.o. [name] who has above history reviewed by me today presents for follow up visit for spleen lesion and pancreatic lesion.  She had MRI done.  Patient has no new complaints. 11/01/2019, patient [facility]  Patient was recommended to either proceed with endoscopy ultrasound/fine-needle aspiration or reimage  in 6 months time by pancreatic protocol MRI was recommended.  Decision was made to continue follow-up with interval images and tumor markers. Patient reports lower back pain 4 out of 10 at baseline but now 8-9/10.  Decreased appetite.  Unintentional weight loss about 10 pounds.  Review of Systems  Constitutional: Positive for appetite change and unexpected weight change. Negative for chills, fatigue and fever.  HENT:   Negative for hearing loss and voice change.   Eyes: Negative for eye problems.  Respiratory: Negative for chest tightness and cough.   Cardiovascular: Negative for chest pain.  Gastrointestinal: Negative for abdominal distention, abdominal pain and blood in stool.  Endocrine: Negative for hot flashes.  Genitourinary: Negative for difficulty urinating and frequency.   Musculoskeletal: Positive for back pain. Negative for arthralgias.  Skin: Negative for itching and rash.  Neurological: Negative for extremity weakness.  Hematological: Negative for adenopathy.  Psychiatric/Behavioral: Negative for confusion.    MEDICAL HISTORY:  Past Medical History:  Diagnosis Date  . Acquired hammertoe    other  . Acquired hammertoe   . Angina pectoris, unspecified ([place]   . Angina pectoris, unspecified ([facility]   . Arthritis    knees  . Breast cancer ([place] 1990   left breast  . Cancer ([place]   left breast  . CHF (congestive heart failure) ([place]   . Chronic kidney disease    stage 2  . Degenerative joint disease   . Dermatophytosis of nail   . Diabetes mellitus type 2, uncomplicated (HCC)    diet controlled  . Dyspnea   . Essential hypertension, benign   . Exostosis  unspecified site  . Fracture of right ankle 2010   Followed by Dr. Vickki Muff  . GERD (gastroesophageal reflux disease)   . Hemorrhoids   . Hypercholesterolemia   . Hyperlipidemia, unspecified   . Hypertension   . Lymphedema of leg    left leg worse than right leg.  . Obesity, unspecified   .  Osteoarthrosis involving lower leg    unspecified whether generalized or localized   . Pre-diabetes   . Pure hypercholesterolemia   . Vitamin D deficiency, unspecified   . Wears dentures    partial upper    SURGICAL HISTORY: Past Surgical History:  Procedure Laterality Date  . BREAST LUMPECTOMY Left   . BREAST SURGERY Left 1990   lumpectomy  . BUNIONECTOMY Bilateral 1990's   plus hammer toe repair on left foot.  Marland Kitchen CATARACT EXTRACTION W/PHACO Left 08/10/2018   Procedure: CATARACT EXTRACTION PHACO AND INTRAOCULAR LENS PLACEMENT ([place])  LEFT DIABETIC;  Surgeon: Eulogio Bear, MD;  Location: [place];  Service: Ophthalmology;  Laterality: Left;  diabetic - diet controlled  . CATARACT EXTRACTION W/PHACO Right 11/16/2018   Procedure: CATARACT EXTRACTION PHACO AND INTRAOCULAR LENS PLACEMENT ([place])  RIGHT DIABETIC;  Surgeon: Eulogio Bear, MD;  Location: [place];  Service: Ophthalmology;  Laterality: Right;  Diabetic - diet controlled  . COLONOSCOPY    . COLONOSCOPY WITH PROPOFOL N/A 05/08/2018   Procedure: COLONOSCOPY WITH PROPOFOL;  Surgeon: Lollie Sails, MD;  Location[facility] ENDOSCOPY;  Service: Endoscopy;  Laterality: N/A;  . ENDOMETRIAL BIOPSY    . FOOT SURGERY Left    for bunionectomy and hammertoe  . JOINT REPLACEMENT    . TONSILLECTOMY    . TOTAL KNEE ARTHROPLASTY Left 04/08/2017   Procedure: TOTAL KNEE ARTHROPLASTY;  Surgeon: Corky Mull, MD;  Location: [facility];  Service: Orthopedics;  Laterality: Left;  . TOTAL KNEE ARTHROPLASTY Right 08/19/2017   Procedure: TOTAL KNEE ARTHROPLASTY;  Surgeon: Corky Mull, MD;  Location: [facility];  Service: Orthopedics;  Laterality: Right;    SOCIAL HISTORY: Social History   Socioeconomic History  . Marital status: Widowed    Spouse name: Not on file  . Number of children: 2  . Years of education: Not on file  . Highest education level: Not on file  Occupational History  . Occupation: Retired  Tobacco  Use  . Smoking status: Never Smoker  . Smokeless tobacco: Never Used  Vaping Use  . Vaping Use: Never used  Substance and Sexual Activity  . Alcohol use: Yes    Comment: 1-2 drinks every 1-2 months  . Drug use: No  . Sexual activity: Not on file  Other Topics Concern  . Not on file  Social History Narrative  . Not on file   Social Determinants of Health   Financial Resource Strain:   . Difficulty of Paying Living Expenses: Not on file  Food Insecurity:   . Worried About Charity fundraiser in the Last Year: Not on file  . Ran Out of Food in the Last Year: Not on file  Transportation Needs:   . Lack of Transportation (Medical): Not on file  . Lack of Transportation (Non-Medical): Not on file  Physical Activity:   . Days of Exercise per Week: Not on file  . Minutes of Exercise per Session: Not on file  Stress:   . Feeling of Stress : Not on file  Social Connections:   . Frequency of Communication with Friends and Family: Not  on file  . Frequency of Social Gatherings with Friends and Family: Not on file  . Attends Religious Services: Not on file  . Active Member of Clubs or Organizations: Not on file  . Attends Archivist Meetings: Not on file  . Marital Status: Not on file  Intimate Partner Violence:   . Fear of Current or Ex-Partner: Not on file  . Emotionally Abused: Not on file  . Physically Abused: Not on file  . Sexually Abused: Not on file    FAMILY HISTORY: Family History  Problem Relation Age of Onset  . Stroke Mother   . Hypertension Mother   . Cancer Father   . Prostate cancer Brother   . Breast cancer Maternal Aunt 89  . Breast cancer Cousin        mat cousin  . Kidney cancer Neg Hx   . Kidney disease Neg Hx     ALLERGIES:  is allergic to colesevelam, oxybutynin, and rosuvastatin.  MEDICATIONS:  Current Outpatient Medications  Medication Sig Dispense Refill  . acetaminophen (TYLENOL) 325 MG tablet Take 1,000 mg by mouth 2 (two) times  daily.     Marland Kitchen aspirin EC 81 MG tablet Take 81 mg by mouth daily.    Marland Kitchen atorvastatin (LIPITOR) 20 MG tablet Take 20 mg by mouth at bedtime.     Marland Kitchen azithromycin (ZITHROMAX) 250 MG tablet Take by mouth 3 (three) times a week.    . ergocalciferol (VITAMIN D2) 1.25 MG (50000 UT) capsule Take 50,000 Units by mouth once a week.    . gabapentin (NEURONTIN) 300 MG capsule Take 300 mg by mouth at bedtime.    . magnesium oxide (MAG-OX) 400 MG tablet Take 800 mg by mouth daily.     Marland Kitchen nystatin cream (MYCOSTATIN) Apply 1 application topically 2 (two) times daily as needed. Apply thin film to yeast rash under the breasts     . Omega-3 Krill Oil 500 MG CAPS Take 500 mg by mouth 3 (three) times a week. On Monday Wednesday and Friday    . omeprazole (PRILOSEC) 20 MG capsule Take 20 mg by mouth daily. Acid reflux     . sennosides-docusate sodium (SENOKOT-S) 8.6-50 MG tablet Take 2 tablets by mouth 2 (two) times daily.     . solifenacin (VESICARE) 5 MG tablet Take 5 mg by mouth daily.    Marland Kitchen triamterene-hydrochlorothiazide (DYAZIDE) 37.5-25 MG capsule Take 1 capsule by mouth daily.    Marland Kitchen albuterol (PROVENTIL HFA;VENTOLIN HFA) 108 (90 Base) MCG/ACT inhaler Inhale 2 puffs into the lungs every 6 (six) hours as needed for wheezing or shortness of breath. (Patient not taking: Reported on 10/14/2019) 1 Inhaler 0  . furosemide (LASIX) 40 MG tablet Take one tablet by mouth once daily as needed for lower extremity edema (Patient not taking: Reported on 04/17/2020)    . hydrocortisone (ANUSOL-HC) 2.5 % rectal cream Place 1 application rectally 2 (two) times daily. (Patient not taking: Reported on 04/17/2020)    . lovastatin (MEVACOR) 40 MG tablet Take 40 mg by mouth at bedtime. (Patient not taking: Reported on 04/17/2020)    . potassium chloride (K-DUR,KLOR-CON) 10 MEQ tablet Take 10 mEq by mouth daily.  (Patient not taking: Reported on 04/17/2020)    . TOPROL XL 25 MG 24 hr tablet Take 25 mg by mouth at bedtime.  (Patient not taking:  Reported on 04/17/2020)    . traMADol (ULTRAM) 50 MG tablet Take 50 mg by mouth every 6 (six) hours as needed. (  Patient not taking: Reported on 04/17/2020)     No current facility-administered medications for this visit.     PHYSICAL EXAMINATION: ECOG PERFORMANCE STATUS: 1 - Symptomatic but completely ambulatory Vitals:   04/17/20 1014  BP: (!) 146/73  Pulse: (!) 51  Resp: 18  Temp: (!) 95.9 F (35.5 C)   Filed Weights   04/17/20 1014  Weight: 264 lb 12.8 oz (120.1 kg)    Physical Exam Constitutional:      General: She is not in acute distress.    Appearance: She is obese.  HENT:     Head: Normocephalic and atraumatic.  Eyes:     General: No scleral icterus.    Pupils: Pupils are equal, round, and reactive to light.  Cardiovascular:     Rate and Rhythm: Normal rate and regular rhythm.     Heart sounds: Normal heart sounds.  Pulmonary:     Effort: Pulmonary effort is normal. No respiratory distress.     Breath sounds: No wheezing.  Abdominal:     General: Bowel sounds are normal. There is no distension.     Palpations: Abdomen is soft. There is no mass.     Tenderness: There is no abdominal tenderness.  Musculoskeletal:        General: No deformity. Normal range of motion.     Cervical back: Normal range of motion and neck supple.  Skin:    General: Skin is warm and dry.     Findings: No erythema or rash.  Neurological:     Mental Status: She is alert and oriented to person, place, and time. Mental status is at baseline.     Cranial Nerves: No cranial nerve deficit.     Coordination: Coordination normal.  Psychiatric:        Mood and Affect: Mood normal.        Behavior: Behavior normal.        Thought Content: Thought content normal.      LABORATORY DATA:  I have reviewed the data as listed Lab Results  Component Value Date   WBC 5.2 04/14/2020   HGB 14.7 04/14/2020   HCT 44.5 04/14/2020   MCV 91.8 04/14/2020   PLT 164 04/14/2020   Recent Labs     05/25/19 1001 04/14/20 0910  NA  --  141  K  --  4.1  CL  --  108  CO2  --  26  GLUCOSE  --  109*  BUN  --  22  CREATININE 1.10* 1.28*  CALCIUM  --  9.7  GFRNONAA  --  39*  PROT  --  7.3  ALBUMIN  --  4.1  AST  --  17  ALT  --  13  ALKPHOS  --  89  BILITOT  --  0.9   Iron/TIBC/Ferritin/ %Sat No results found for: IRON, TIBC, FERRITIN, IRONPCTSAT   RADIOGRAPHIC STUDIES: I have personally reviewed the radiological images as listed and agreed with the findings in the report. MR 3D Recon At Scanner  Result Date: 04/14/2020 CLINICAL DATA:  Follow-up pancreatic cystic lesion. EXAM: MRI ABDOMEN WITHOUT AND WITH CONTRAST (INCLUDING MRCP) TECHNIQUE: Multiplanar multisequence MR imaging of the abdomen was performed both before and after the administration of intravenous contrast. Heavily T2-weighted images of the biliary and pancreatic ducts were obtained, and three-dimensional MRCP images were rendered by post processing. CONTRAST:  92m GADAVIST GADOBUTROL 1 MMOL/ML IV SOLN COMPARISON:  10/12/2019, and prior CTs dating back to 05/25/2018 FINDINGS: Lower chest:  No acute findings. Hepatobiliary: No hepatic masses identified. Gallbladder is unremarkable. No evidence of biliary ductal dilatation. Pancreas: A 1.7 by 1.5 x 1.1 cm cystic lesion in the pancreatic tail remains stable compared to previous studies dating back to 05/25/2018. This contains a few thin internal septations, but no evidence of contrast enhancement. No other pancreatic lesions identified. No evidence of pancreatic ductal dilatation. Spleen: No evidence of splenomegaly. A solid enhancing mass is again seen in the anterior aspect of the spleen which measures 5.3 x 4.3 cm. This shows T2 isointensity and nearly isointense enhancement with normal splenic parenchyma, and remains stable since previous studies dating back to 2019. Adrenals/Urinary Tract: Stable small cyst in upper pole of left kidney. No masses identified. No evidence of  hydronephrosis. Stomach/Bowel: Visualized portion unremarkable. Vascular/Lymphatic: No pathologically enlarged lymph nodes identified. No abdominal aortic aneurysm. Other:  None. Musculoskeletal:  No suspicious bone lesions identified. IMPRESSION: Stable 1.7 cm cystic lesion in the pancreatic tail, likely representing an indolent cystic neoplasm such as a side-branch IPMN. Recommend continued follow-up by MRI in 1 year. This recommendation follows ACR consensus guidelines: Management of Incidental Pancreatic Cysts: A White Paper of the ACR Incidental Findings Committee. [place] [id number];00:[id number]. Stable splenic mass consistent with benign etiology, possibly a hamartoma. Electronically Signed   By: Marlaine Hind M.D.   On: 04/14/2020 11:41   MR ABDOMEN MRCP W WO CONTAST  Result Date: 04/14/2020 CLINICAL DATA:  Follow-up pancreatic cystic lesion. EXAM: MRI ABDOMEN WITHOUT AND WITH CONTRAST (INCLUDING MRCP) TECHNIQUE: Multiplanar multisequence MR imaging of the abdomen was performed both before and after the administration of intravenous contrast. Heavily T2-weighted images of the biliary and pancreatic ducts were obtained, and three-dimensional MRCP images were rendered by post processing. CONTRAST:  67m GADAVIST GADOBUTROL 1 MMOL/ML IV SOLN COMPARISON:  10/12/2019, and prior CTs dating back to 05/25/2018 FINDINGS: Lower chest: No acute findings. Hepatobiliary: No hepatic masses identified. Gallbladder is unremarkable. No evidence of biliary ductal dilatation. Pancreas: A 1.7 by 1.5 x 1.1 cm cystic lesion in the pancreatic tail remains stable compared to previous studies dating back to 05/25/2018. This contains a few thin internal septations, but no evidence of contrast enhancement. No other pancreatic lesions identified. No evidence of pancreatic ductal dilatation. Spleen: No evidence of splenomegaly. A solid enhancing mass is again seen in the anterior aspect of the spleen which measures 5.3 x 4.3 cm.  This shows T2 isointensity and nearly isointense enhancement with normal splenic parenchyma, and remains stable since previous studies dating back to 2019. Adrenals/Urinary Tract: Stable small cyst in upper pole of left kidney. No masses identified. No evidence of hydronephrosis. Stomach/Bowel: Visualized portion unremarkable. Vascular/Lymphatic: No pathologically enlarged lymph nodes identified. No abdominal aortic aneurysm. Other:  None. Musculoskeletal:  No suspicious bone lesions identified. IMPRESSION: Stable 1.7 cm cystic lesion in the pancreatic tail, likely representing an indolent cystic neoplasm such as a side-branch IPMN. Recommend continued follow-up by MRI in 1 year. This recommendation follows ACR consensus guidelines: Management of Incidental Pancreatic Cysts: A White Paper of the ACR Incidental Findings Committee. [id number] Stable splenic mass consistent with benign etiology, possibly a hamartoma. Electronically Signed   By: JMarlaine HindM.D.   On: 04/14/2020 11:41    ASSESSMENT & PLAN:  1. Pancreatic cyst   2. Lung nodule   3. 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 Maudie Mercury. Images were reviewed and discussed with patient.  Interval surveillance scan showed a stable size of the pancreatic cyst.Given that the size has continued to be stable over the past 24 months, recommend patient to continue observation and follow-up with a repeat MRI in 1 year.  Continue monitoring tumor marker CA 19 9 and CEA.   # Spleen lesion, MRI showed stable spleen lesion likely benign hematoma.  This lesion was FDG negative on previous PET scan.  #Unintentional weight loss and decreased appetite, unknown etiology. #Lung nodules, Patient had CT chest done on 04/23/2020 which showed lung nodules which I  recommend to follow-up with a repeat CT scan.  Patient agrees with the plan and will obtain repeat CT chest without contrast.  We spent sufficient time to discuss many aspect of care, questions were answered to patient's satisfaction.  All questions were answered. The patient knows to call the clinic with any problems questions or concerns.  Return of visit: 12  months.  Earlie Server, MD, PhD Hematology Oncology [facility] at [facility] Pager- [phone] 04/17/2020

## 2020-04-17 NOTE — Progress Notes (Signed)
Patient reports new low back pain that is 4/10 now but usually 8-9/10 in the morning.  Decrease in appetite with 10 lb weight loss since last documented wt in chart.

## 2020-04-27 ENCOUNTER — Ambulatory Visit: Admission: RE | Admit: 2020-04-27 | Payer: Medicare Other | Source: Ambulatory Visit

## 2020-06-08 ENCOUNTER — Other Ambulatory Visit: Payer: Self-pay | Admitting: Internal Medicine

## 2020-06-08 DIAGNOSIS — Z1231 Encounter for screening mammogram for malignant neoplasm of breast: Secondary | ICD-10-CM

## 2020-07-09 NOTE — Progress Notes (Unsigned)
MRN : [id number]  [name] is a 80 y.o. (12/27/1940) female who presents with chief complaint of No chief complaint on file. Marland Kitchen  History of Present Illness:  The patient returns to the office for followup evaluation regarding leg swelling.  The swelling has persisted but with the lymph pump the patient states the swelling is much better controlled. The pain associated with swelling is essentially eliminated. There have not been any interval development of a ulcerations or wounds.  No episodes of cellulitis or infection over the past 12 months  The patient denies problems with the pump, noting it is working but she has not been using it regularly.   Since the previous visit the patient has been wearing graduated compression stockings and using the lymph pump intermittently and  She notes significant improvement in the lymphedema when she is using compression and the pump.   Patient stated the lymph pump has been a very positive factor in her care.   No outpatient medications have been marked as taking for the 07/10/20 encounter (Appointment) with Delana Meyer, Dolores Lory, MD.    Past Medical History:  Diagnosis Date  . Acquired hammertoe    other  . Acquired hammertoe   . Angina pectoris, unspecified ([place])   . Angina pectoris, unspecified ([place])   . Arthritis    knees  . Breast cancer ([place]) 1990   left breast  . Cancer ([place]) 1990,s   left breast  . CHF (congestive heart failure) ([place])   . Chronic kidney disease    stage 2  . Degenerative joint disease   . Dermatophytosis of nail   . Diabetes mellitus type 2, uncomplicated ([facility])    diet controlled  . Dyspnea   . Essential hypertension, benign   . Exostosis    unspecified site  . Fracture of right ankle 2010   Followed by Dr. Vickki Muff  . GERD (gastroesophageal reflux disease)   . Hemorrhoids   . Hypercholesterolemia   . Hyperlipidemia, unspecified   . Hypertension   . Lymphedema of leg    left leg worse than right  leg.  . Obesity, unspecified   . Osteoarthrosis involving lower leg    unspecified whether generalized or localized   . Pre-diabetes   . Pure hypercholesterolemia   . Vitamin D deficiency, unspecified   . Wears dentures    partial upper    Past Surgical History:  Procedure Laterality Date  . BREAST LUMPECTOMY Left   . BREAST SURGERY Left 1990   lumpectomy  . BUNIONECTOMY Bilateral 1990's   plus hammer toe repair on left foot.  Marland Kitchen CATARACT EXTRACTION W/PHACO Left 08/10/2018   Procedure: CATARACT EXTRACTION PHACO AND INTRAOCULAR LENS PLACEMENT ([place])  LEFT DIABETIC;  Surgeon: Eulogio Bear, MD;  Location: [facility];  Service: Ophthalmology;  Laterality: Left;  diabetic - diet controlled  . CATARACT EXTRACTION W/PHACO Right 11/16/2018   Procedure: CATARACT EXTRACTION PHACO AND INTRAOCULAR LENS PLACEMENT ([place])  RIGHT DIABETIC;  Surgeon: Eulogio Bear, MD;  Location: [facility];  Service: Ophthalmology;  Laterality: Right;  Diabetic - diet controlled  . COLONOSCOPY    . COLONOSCOPY WITH PROPOFOL N/A 05/08/2018   Procedure: COLONOSCOPY WITH PROPOFOL;  Surgeon: Lollie Sails, MD;  Location: [facility] ENDOSCOPY;  Service: Endoscopy;  Laterality: N/A;  . ENDOMETRIAL BIOPSY    . FOOT SURGERY Left    for bunionectomy and hammertoe  . JOINT REPLACEMENT    . TONSILLECTOMY    . TOTAL  KNEE ARTHROPLASTY Left 04/08/2017   Procedure: TOTAL KNEE ARTHROPLASTY;  Surgeon: Corky Mull, MD;  Location: [facility];  Service: Orthopedics;  Laterality: Left;  . TOTAL KNEE ARTHROPLASTY Right 08/19/2017   Procedure: TOTAL KNEE ARTHROPLASTY;  Surgeon: Corky Mull, MD;  Location: [facility];  Service: Orthopedics;  Laterality: Right;    Social History Social History   Tobacco Use  . Smoking status: Never Smoker  . Smokeless tobacco: Never Used  Vaping Use  . Vaping Use: Never used  Substance Use Topics  . Alcohol use: Yes    Comment: 1-2 drinks every 1-2 months  . Drug use: No     Family History Family History  Problem Relation Age of Onset  . Stroke Mother   . Hypertension Mother   . Cancer Father   . Prostate cancer Brother   . Breast cancer Maternal Aunt 89  . Breast cancer Cousin        mat cousin  . Kidney cancer Neg Hx   . Kidney disease Neg Hx     Allergies  Allergen Reactions  . Colesevelam Other (See Comments)    constipation  . Oxybutynin Swelling    Leg swelling  . Rosuvastatin Other (See Comments)    Pain in lower extremities     REVIEW OF SYSTEMS (Negative unless checked)  Constitutional: [] Weight loss  [] Fever  [] Chills Cardiac: [] Chest pain   [] Chest pressure   [] Palpitations   [] Shortness of breath when laying flat   [] Shortness of breath with exertion. Vascular:  [] Pain in legs with walking   [x] Pain in legs at rest  [] History of DVT   [] Phlebitis   [x] Swelling in legs   [] Varicose veins   [] Non-healing ulcers Pulmonary:   [] Uses home oxygen   [] Productive cough   [] Hemoptysis   [] Wheeze  [] COPD   [] Asthma Neurologic:  [] Dizziness   [] Seizures   [] History of stroke   [] History of TIA  [] Aphasia   [] Vissual changes   [] Weakness or numbness in arm   [] Weakness or numbness in leg Musculoskeletal:   [] Joint swelling   [x] Joint pain   [] Low back pain Hematologic:  [] Easy bruising  [] Easy bleeding   [] Hypercoagulable state   [] Anemic Gastrointestinal:  [] Diarrhea   [] Vomiting  [x] Gastroesophageal reflux/heartburn   [] Difficulty swallowing. Genitourinary:  [] Chronic kidney disease   [] Difficult urination  [] Frequent urination   [] Blood in urine Skin:  [] Rashes   [] Ulcers  Psychological:  [] History of anxiety   []  History of major depression.  Physical Examination  There were no vitals filed for this visit. There is no height or weight on file to calculate BMI. Gen: WD/WN, NAD Head: [place]/AT, No temporalis wasting.  Ear/Nose/Throat: Hearing grossly intact, nares w/o erythema or drainage Eyes: PER, EOMI, sclera nonicteric.  Neck:  Supple, no large masses.   Pulmonary:  Good air movement, no audible wheezing bilaterally, no use of accessory muscles.  Cardiac: RRR, no JVD Vascular: scattered varicosities present bilaterally.  Mild venous stasis changes to the legs bilaterally.  1-2+ soft pitting edema Vessel Right Left  Radial Palpable Palpable  Gastrointestinal: Non-distended. No guarding/no peritoneal signs.  Musculoskeletal: M/S 5/5 throughout.  No deformity or atrophy.  Neurologic: CN 2-12 intact. Symmetrical.  Speech is fluent. Motor exam as listed above. Psychiatric: Judgment intact, Mood & affect appropriate for pt's clinical situation. Dermatologic: mild venous rashes no ulcers noted.  No changes consistent with cellulitis. Lymph : No lichenification or skin changes of chronic lymphedema.  CBC Lab Results  Component Value Date   WBC 5.2 04/14/2020   HGB 14.7 04/14/2020   HCT 44.5 04/14/2020   MCV 91.8 04/14/2020   PLT 164 04/14/2020    BMET    Component Value Date/Time   NA 141 04/14/2020 0910   K 4.1 04/14/2020 0910   CL 108 04/14/2020 0910   CO2 26 04/14/2020 0910   GLUCOSE 109 (H) 04/14/2020 0910   BUN 22 04/14/2020 0910   CREATININE 1.28 (H) 04/14/2020 0910   CALCIUM 9.7 04/14/2020 0910   GFRNONAA 39 (L) 04/14/2020 0910   GFRAA >60 08/22/2017 0601   CrCl cannot be calculated (Patient's most recent lab result is older than the maximum 21 days allowed.).  COAG Lab Results  Component Value Date   INR 1.00 08/08/2017   INR 1.01 03/12/2017    Radiology No results found.   Assessment/Plan 1. Chronic venous insufficiency  No surgery or intervention at this point in time.    I have reviewed my discussion with the patient regarding lymphedema and why it  causes symptoms.  Patient will continue wearing graduated compression stockings class 1 (20-30 mmHg) on a daily basis a prescription was given. The patient is reminded to put the stockings on first thing in the morning and removing them  in the evening. The patient is instructed specifically not to sleep in the stockings.   In addition, behavioral modification throughout the day will be continued.  This will include frequent elevation (such as in a recliner), use of over the counter pain medications as needed and exercise such as walking.  I have reviewed systemic causes for chronic edema such as liver, kidney and cardiac etiologies and there does not appear to be any significant changes in these organ systems over the past year.  The patient is under the impression that these organ systems are all stable and unchanged.    The patient will continue aggressive use of the  lymph pump.  This will continue to improve the edema control and prevent sequela such as ulcers and infections.   The patient will follow-up with me on an annual basis.    2. Essential hypertension Continue antihypertensive medications as already ordered, these medications have been reviewed and there are no changes at this time.   3. Gastroesophageal reflux disease without esophagitis Continue PPI as already ordered, this medication has been reviewed and there are no changes at this time.  Avoidence of caffeine and alcohol  Moderate elevation of the head of the bed   4. Hyperlipidemia, unspecified hyperlipidemia type Continue statin as ordered and reviewed, no changes at this time     Hortencia Pilar, MD  07/09/2020 7:42 PM

## 2020-07-10 ENCOUNTER — Other Ambulatory Visit: Payer: Self-pay

## 2020-07-10 ENCOUNTER — Ambulatory Visit (INDEPENDENT_AMBULATORY_CARE_PROVIDER_SITE_OTHER): Payer: Medicare Other | Admitting: Vascular Surgery

## 2020-07-10 ENCOUNTER — Encounter (INDEPENDENT_AMBULATORY_CARE_PROVIDER_SITE_OTHER): Payer: Self-pay | Admitting: Vascular Surgery

## 2020-07-10 ENCOUNTER — Other Ambulatory Visit (HOSPITAL_COMMUNITY): Payer: Self-pay | Admitting: Internal Medicine

## 2020-07-10 ENCOUNTER — Other Ambulatory Visit: Payer: Self-pay | Admitting: Internal Medicine

## 2020-07-10 VITALS — BP 149/79 | HR 75 | Ht 71.0 in | Wt 272.0 lb

## 2020-07-10 DIAGNOSIS — I872 Venous insufficiency (chronic) (peripheral): Secondary | ICD-10-CM

## 2020-07-10 DIAGNOSIS — R0989 Other specified symptoms and signs involving the circulatory and respiratory systems: Secondary | ICD-10-CM

## 2020-07-10 DIAGNOSIS — K219 Gastro-esophageal reflux disease without esophagitis: Secondary | ICD-10-CM | POA: Diagnosis not present

## 2020-07-10 DIAGNOSIS — I10 Essential (primary) hypertension: Secondary | ICD-10-CM | POA: Diagnosis not present

## 2020-07-10 DIAGNOSIS — I6523 Occlusion and stenosis of bilateral carotid arteries: Secondary | ICD-10-CM

## 2020-07-10 DIAGNOSIS — E785 Hyperlipidemia, unspecified: Secondary | ICD-10-CM

## 2020-07-10 DIAGNOSIS — E559 Vitamin D deficiency, unspecified: Secondary | ICD-10-CM

## 2020-07-11 ENCOUNTER — Other Ambulatory Visit: Payer: Self-pay | Admitting: Internal Medicine

## 2020-07-14 ENCOUNTER — Other Ambulatory Visit (HOSPITAL_COMMUNITY): Payer: Self-pay | Admitting: Internal Medicine

## 2020-07-14 ENCOUNTER — Other Ambulatory Visit: Payer: Self-pay | Admitting: Internal Medicine

## 2020-07-14 DIAGNOSIS — R0989 Other specified symptoms and signs involving the circulatory and respiratory systems: Secondary | ICD-10-CM

## 2020-07-14 DIAGNOSIS — R9389 Abnormal findings on diagnostic imaging of other specified body structures: Secondary | ICD-10-CM

## 2020-07-18 ENCOUNTER — Ambulatory Visit: Admission: RE | Admit: 2020-07-18 | Payer: Medicare Other | Source: Ambulatory Visit

## 2020-07-18 ENCOUNTER — Ambulatory Visit
Admission: RE | Admit: 2020-07-18 | Discharge: 2020-07-18 | Disposition: A | Discharge status: Home / Self Care | Payer: Medicare Other | Source: Ambulatory Visit | Attending: Internal Medicine | Admitting: Internal Medicine

## 2020-07-18 ENCOUNTER — Other Ambulatory Visit: Payer: Self-pay

## 2020-07-18 DIAGNOSIS — I6523 Occlusion and stenosis of bilateral carotid arteries: Secondary | ICD-10-CM | POA: Diagnosis present

## 2020-07-18 DIAGNOSIS — E559 Vitamin D deficiency, unspecified: Secondary | ICD-10-CM

## 2020-07-18 DIAGNOSIS — R0989 Other specified symptoms and signs involving the circulatory and respiratory systems: Secondary | ICD-10-CM

## 2020-07-18 LAB — POCT I-STAT CREATININE: Creatinine, Ser: 1.1 mg/dL — ABNORMAL HIGH (ref 0.44–1.00)

## 2020-07-18 MED ORDER — IOHEXOL 350 MG/ML SOLN
75.0000 mL | Freq: Once | INTRAVENOUS | Status: AC | PRN
  Administered 2020-07-18: 75 mL via INTRAVENOUS

## 2020-07-19 ENCOUNTER — Other Ambulatory Visit: Payer: Self-pay

## 2020-07-19 ENCOUNTER — Ambulatory Visit
Admission: RE | Admit: 2020-07-19 | Discharge: 2020-07-19 | Disposition: A | Discharge status: Home / Self Care | Payer: Medicare Other | Source: Ambulatory Visit | Attending: Internal Medicine | Admitting: Internal Medicine

## 2020-07-19 DIAGNOSIS — Z1231 Encounter for screening mammogram for malignant neoplasm of breast: Secondary | ICD-10-CM | POA: Insufficient documentation

## 2020-07-20 ENCOUNTER — Ambulatory Visit: Payer: Medicare Other

## 2020-07-26 ENCOUNTER — Other Ambulatory Visit: Payer: Self-pay | Admitting: Internal Medicine

## 2020-07-26 DIAGNOSIS — R9389 Abnormal findings on diagnostic imaging of other specified body structures: Secondary | ICD-10-CM

## 2020-07-26 DIAGNOSIS — R221 Localized swelling, mass and lump, neck: Secondary | ICD-10-CM

## 2020-07-27 ENCOUNTER — Other Ambulatory Visit: Payer: Self-pay

## 2020-07-27 ENCOUNTER — Ambulatory Visit
Admission: RE | Admit: 2020-07-27 | Discharge: 2020-07-27 | Disposition: A | Discharge status: Home / Self Care | Payer: Medicare Other | Source: Ambulatory Visit | Attending: Internal Medicine | Admitting: Internal Medicine

## 2020-07-27 DIAGNOSIS — R221 Localized swelling, mass and lump, neck: Secondary | ICD-10-CM | POA: Insufficient documentation

## 2020-07-27 DIAGNOSIS — R9389 Abnormal findings on diagnostic imaging of other specified body structures: Secondary | ICD-10-CM | POA: Insufficient documentation

## 2020-07-31 ENCOUNTER — Encounter (INDEPENDENT_AMBULATORY_CARE_PROVIDER_SITE_OTHER): Payer: Medicare Other | Admitting: Vascular Surgery

## 2020-08-14 ENCOUNTER — Other Ambulatory Visit: Payer: Self-pay

## 2020-08-14 ENCOUNTER — Encounter (INDEPENDENT_AMBULATORY_CARE_PROVIDER_SITE_OTHER): Payer: Self-pay | Admitting: Vascular Surgery

## 2020-08-14 ENCOUNTER — Ambulatory Visit (INDEPENDENT_AMBULATORY_CARE_PROVIDER_SITE_OTHER): Payer: Medicare Other | Admitting: Vascular Surgery

## 2020-08-14 VITALS — BP 158/82 | HR 70 | Resp 16 | Wt 270.0 lb

## 2020-08-14 DIAGNOSIS — I6523 Occlusion and stenosis of bilateral carotid arteries: Secondary | ICD-10-CM

## 2020-08-14 DIAGNOSIS — I110 Hypertensive heart disease with heart failure: Secondary | ICD-10-CM

## 2020-08-14 DIAGNOSIS — E785 Hyperlipidemia, unspecified: Secondary | ICD-10-CM

## 2020-08-14 DIAGNOSIS — I5032 Chronic diastolic (congestive) heart failure: Secondary | ICD-10-CM

## 2020-08-14 DIAGNOSIS — I10 Essential (primary) hypertension: Secondary | ICD-10-CM

## 2020-08-14 DIAGNOSIS — I872 Venous insufficiency (chronic) (peripheral): Secondary | ICD-10-CM

## 2020-08-14 DIAGNOSIS — I6529 Occlusion and stenosis of unspecified carotid artery: Secondary | ICD-10-CM | POA: Insufficient documentation

## 2020-08-14 NOTE — Progress Notes (Signed)
MRN : [id number]  [name] is a 80 y.o. ([birth year]) female who presents with chief complaint of No chief complaint on file. Marland Kitchen  History of Present Illness:   The patient is seen for evaluation of carotid stenosis. The carotid stenosis was identified after after a bruit was found.  CT angiogram was obtained which showed 60% RICA and minimal plaque in the LICA.  The patient denies amaurosis fugax. There is no recent history of TIA symptoms or focal motor deficits. There is no prior documented CVA.  There is no history of migraine headaches. There is no history of seizures.  The patient is taking enteric-coated aspirin 81 mg daily.  The patient has a history of coronary artery disease, no recent episodes of angina or shortness of breath. The patient denies PAD or claudication symptoms. There is a history of hyperlipidemia which is being treated with a statin.     No outpatient medications have been marked as taking for the 08/14/20 encounter (Appointment) with Delana Meyer, Dolores Lory, MD.    Past Medical History:  Diagnosis Date  . Acquired hammertoe    other  . Acquired hammertoe   . Angina pectoris, unspecified ([place])   . Angina pectoris, unspecified ([place])   . Arthritis    knees  . Breast cancer ([place]) 1990   left breast  . Cancer ([place]) 1990,s   left breast  . CHF (congestive heart failure) ([place])   . Chronic kidney disease    stage 2  . Degenerative joint disease   . Dermatophytosis of nail   . Diabetes mellitus type 2, uncomplicated (HCC)    diet controlled  . Dyspnea   . Essential hypertension, benign   . Exostosis    unspecified site  . Fracture of right ankle 2010   Followed by Dr. Vickki Muff  . GERD (gastroesophageal reflux disease)   . Hemorrhoids   . Hypercholesterolemia   . Hyperlipidemia, unspecified   . Hypertension   . Lymphedema of leg    left leg worse than right leg.  . Obesity, unspecified   . Osteoarthrosis involving lower leg    unspecified  whether generalized or localized   . Pre-diabetes   . Pure hypercholesterolemia   . Vitamin D deficiency, unspecified   . Wears dentures    partial upper    Past Surgical History:  Procedure Laterality Date  . BREAST LUMPECTOMY Left 1995  . BREAST SURGERY Left 1990   lumpectomy  . BUNIONECTOMY Bilateral 1990's   plus hammer toe repair on left foot.  Marland Kitchen CATARACT EXTRACTION W/PHACO Left 08/10/2018   Procedure: CATARACT EXTRACTION PHACO AND INTRAOCULAR LENS PLACEMENT ([place])  LEFT DIABETIC;  Surgeon: Eulogio Bear, MD;  Location: [facility];  Service: Ophthalmology;  Laterality: Left;  diabetic - diet controlled  . CATARACT EXTRACTION W/PHACO Right 11/16/2018   Procedure: CATARACT EXTRACTION PHACO AND INTRAOCULAR LENS PLACEMENT ([place])  RIGHT DIABETIC;  Surgeon: Eulogio Bear, MD;  Location: [facility];  Service: Ophthalmology;  Laterality: Right;  Diabetic - diet controlled  . COLONOSCOPY    . COLONOSCOPY WITH PROPOFOL N/A 05/08/2018   Procedure: COLONOSCOPY WITH PROPOFOL;  Surgeon: Lollie Sails, MD;  Location[facility] END[facility]COPY;  Service: Endoscopy;  Laterality: N/A;  . ENDOMETRIAL BIOPSY    . FOOT SURGERY Left    for bunionectomy and hammertoe  . JOINT REPLACEMENT    . TONSILLECTOMY    . TOTAL KNEE ARTHROPLASTY Left 04/08/2017   Procedure: TOTAL KNEE ARTHROPLASTY;  Surgeon: Corky Mull, MD;  Location: [facility];  Service: Orthopedics;  Laterality: Left;  . TOTAL KNEE ARTHROPLASTY Right 08/19/2017   Procedure: TOTAL KNEE ARTHROPLASTY;  Surgeon: Corky Mull, MD;  Location: [facility];  Service: Orthopedics;  Laterality: Right;    Social History Social History   Tobacco Use  . Smoking status: Never Smoker  . Smokeless tobacco: Never Used  Vaping Use  . Vaping Use: Never used  Substance Use Topics  . Alcohol use: Yes    Comment: 1-2 drinks every 1-2 months  . Drug use: No    Family History Family History  Problem Relation Age of Onset  . Stroke  Mother   . Hypertension Mother   . Cancer Father   . Prostate cancer Brother   . Breast cancer Maternal Aunt 89  . Breast cancer Cousin        mat cousin  . Kidney cancer Neg Hx   . Kidney disease Neg Hx     Allergies  Allergen Reactions  . Colesevelam Other (See Comments)    constipation  . Oxybutynin Swelling    Leg swelling  . Rosuvastatin Other (See Comments)    Pain in lower extremities     REVIEW OF SYSTEMS (Negative unless checked)  Constitutional: [] Weight loss  [] Fever  [] Chills Cardiac: [] Chest pain   [] Chest pressure   [] Palpitations   [] Shortness of breath when laying flat   [] Shortness of breath with exertion. Vascular:  [] Pain in legs with walking   [] Pain in legs at rest  [] History of DVT   [] Phlebitis   [] Swelling in legs   [] Varicose veins   [] Non-healing ulcers Pulmonary:   [] Uses home oxygen   [] Productive cough   [] Hemoptysis   [] Wheeze  [] COPD   [] Asthma Neurologic:  [] Dizziness   [] Seizures   [] History of stroke   [] History of TIA  [] Aphasia   [] Vissual changes   [] Weakness or numbness in arm   [] Weakness or numbness in leg Musculoskeletal:   [] Joint swelling   [] Joint pain   [] Low back pain Hematologic:  [] Easy bruising  [] Easy bleeding   [] Hypercoagulable state   [] Anemic Gastrointestinal:  [] Diarrhea   [] Vomiting  [] Gastroesophageal reflux/heartburn   [] Difficulty swallowing. Genitourinary:  [] Chronic kidney disease   [] Difficult urination  [] Frequent urination   [] Blood in urine Skin:  [] Rashes   [] Ulcers  Psychological:  [] History of anxiety   []  History of major depression.  Physical Examination  There were no vitals filed for this visit. There is no height or weight on file to calculate BMI. Gen: WD/WN, NAD Head: [place]/AT, No temporalis wasting.  Ear/Nose/Throat: Hearing grossly intact, nares w/o erythema or drainage Eyes: PER, EOMI, sclera nonicteric.  Neck: Supple, no large masses.   Pulmonary:  Good air movement, no audible wheezing  bilaterally, no use of accessory muscles.  Cardiac: RRR, no JVD Vascular: bilateral carotid bruit Vessel Right Left  Radial Palpable Palpable  Brachial Palpable Palpable  Carotid Palpable Palpable  Gastrointestinal: Non-distended. No guarding/no peritoneal signs.  Musculoskeletal: M/S 5/5 throughout.  No deformity or atrophy.  Neurologic: CN 2-12 intact. Symmetrical.  Speech is fluent. Motor exam as listed above. Psychiatric: Judgment intact, Mood & affect appropriate for pt's clinical situation. Dermatologic: No rashes or ulcers noted.  No changes consistent with cellulitis.  CBC Lab Results  Component Value Date   WBC 5.2 04/14/2020   HGB 14.7 04/14/2020   HCT 44.5 04/14/2020   MCV 91.8 04/14/2020   PLT 164 04/14/2020  BMET    Component Value Date/Time   NA 141 04/14/2020 0910   K 4.1 04/14/2020 0910   CL 108 04/14/2020 0910   CO2 26 04/14/2020 0910   GLUCOSE 109 (H) 04/14/2020 0910   BUN 22 04/14/2020 0910   CREATININE 1.10 (H) 07/18/2020 1405   CALCIUM 9.7 04/14/2020 0910   GFRNONAA 39 (L) 04/14/2020 0910   GFRAA >60 08/22/2017 0601   CrCl cannot be calculated (Patient's most recent lab result is older than the maximum 21 days allowed.).  COAG Lab Results  Component Value Date   INR 1.00 08/08/2017   INR 1.01 03/12/2017    Radiology CT ANGIO HEAD W OR WO CONTRAST  Result Date: 07/18/2020 CLINICAL DATA:  Vitamin D deficiency. Right carotid bruit. Atherosclerosis of both carotid arteries. EXAM: CT ANGIOGRAPHY HEAD AND NECK TECHNIQUE: Multidetector CT imaging of the head and neck was performed using the standard protocol during bolus administration of intravenous contrast. Multiplanar CT image reconstructions and MIPs were obtained to evaluate the vascular anatomy. Carotid stenosis measurements (when applicable) are obtained utilizing NASCET criteria, using the distal internal carotid diameter as the denominator. CONTRAST:  90mL OMNIPAQUE IOHEXOL 350 MG/ML SOLN  COMPARISON:  Carotid duplex, July 04, 2020. FINDINGS: CT HEAD FINDINGS Brain: No evidence of acute infarction, hemorrhage, hydrocephalus, extra-axial collection or mass lesion/mass effect. Vascular: No hyperdense vessel. Calcified atherosclerotic plaques in the bilateral carotid siphons. Skull: Normal. Negative for fracture or focal lesion. Sinuses: Imaged portions are clear. Orbits: No acute finding. Review of the MIP images confirms the above findings CTA NECK FINDINGS Aortic arch: Standard branching. Imaged portion shows no evidence of aneurysm or dissection. Calcified plaques are noted in the aortic arch and at the origin of the major neck arteries resulting in mild stenosis at the origin of the left subclavian artery. Increased tortuosity of the proximal major neck arteries is likely related to longstanding hypertension. Right carotid system: Heavily calcified plaques are noted in the right carotid bifurcation resulting in approximately 60% stenosis of the proximal right ICA. Left carotid system: Heavily calcified plaque is seen in the left carotid bifurcation without hemodynamically significant stenosis. Vertebral arteries: Codominant. Increased tortuosity of the V1 segment bilaterally. No evidence of dissection, stenosis (50% or greater) or occlusion. Skeleton: Degenerative changes of the cervical spine. No aggressive bone lesion identified. Other neck: Heterogeneous multinodular thyroid. Upper chest: Negative. Review of the MIP images confirms the above findings CTA HEAD FINDINGS Anterior circulation: No significant stenosis, proximal occlusion, aneurysm, or vascular malformation. Posterior circulation: No significant stenosis, proximal occlusion, aneurysm, or vascular malformation. Venous sinuses: As permitted by contrast timing, patent. Anatomic variants: Right fetal PCA. Review of the MIP images confirms the above findings IMPRESSION: 1. Heavily calcified plaques in the right carotid bifurcation  resulting in approximately 60% stenosis of the proximal right ICA. 2. No hemodynamically significant stenosis in the left carotid bifurcation. 3. No acute intracranial abnormality. 4. Heterogeneous multinodular thyroid. Aortic Atherosclerosis (ICD10-I70.0). Electronically Signed   By: Pedro Earls M.D.   On: 07/18/2020 14:44   CT ANGIO NECK W OR WO CONTRAST  Result Date: 07/18/2020 CLINICAL DATA:  Vitamin D deficiency. Right carotid bruit. Atherosclerosis of both carotid arteries. EXAM: CT ANGIOGRAPHY HEAD AND NECK TECHNIQUE: Multidetector CT imaging of the head and neck was performed using the standard protocol during bolus administration of intravenous contrast. Multiplanar CT image reconstructions and MIPs were obtained to evaluate the vascular anatomy. Carotid stenosis measurements (when applicable) are obtained utilizing NASCET criteria, using the  distal internal carotid diameter as the denominator. CONTRAST:  2mL OMNIPAQUE IOHEXOL 350 MG/ML SOLN COMPARISON:  Carotid duplex, July 04, 2020. FINDINGS: CT HEAD FINDINGS Brain: No evidence of acute infarction, hemorrhage, hydrocephalus, extra-axial collection or mass lesion/mass effect. Vascular: No hyperdense vessel. Calcified atherosclerotic plaques in the bilateral carotid siphons. Skull: Normal. Negative for fracture or focal lesion. Sinuses: Imaged portions are clear. Orbits: No acute finding. Review of the MIP images confirms the above findings CTA NECK FINDINGS Aortic arch: Standard branching. Imaged portion shows no evidence of aneurysm or dissection. Calcified plaques are noted in the aortic arch and at the origin of the major neck arteries resulting in mild stenosis at the origin of the left subclavian artery. Increased tortuosity of the proximal major neck arteries is likely related to longstanding hypertension. Right carotid system: Heavily calcified plaques are noted in the right carotid bifurcation resulting in approximately 60%  stenosis of the proximal right ICA. Left carotid system: Heavily calcified plaque is seen in the left carotid bifurcation without hemodynamically significant stenosis. Vertebral arteries: Codominant. Increased tortuosity of the V1 segment bilaterally. No evidence of dissection, stenosis (50% or greater) or occlusion. Skeleton: Degenerative changes of the cervical spine. No aggressive bone lesion identified. Other neck: Heterogeneous multinodular thyroid. Upper chest: Negative. Review of the MIP images confirms the above findings CTA HEAD FINDINGS Anterior circulation: No significant stenosis, proximal occlusion, aneurysm, or vascular malformation. Posterior circulation: No significant stenosis, proximal occlusion, aneurysm, or vascular malformation. Venous sinuses: As permitted by contrast timing, patent. Anatomic variants: Right fetal PCA. Review of the MIP images confirms the above findings IMPRESSION: 1. Heavily calcified plaques in the right carotid bifurcation resulting in approximately 60% stenosis of the proximal right ICA. 2. No hemodynamically significant stenosis in the left carotid bifurcation. 3. No acute intracranial abnormality. 4. Heterogeneous multinodular thyroid. Aortic Atherosclerosis (ICD10-I70.0). Electronically Signed   By: Pedro Earls M.D.   On: 07/18/2020 14:44   MM 3D SCREEN BREAST BILATERAL  Result Date: 07/19/2020 CLINICAL DATA:  Screening. EXAM: DIGITAL SCREENING BILATERAL MAMMOGRAM WITH TOMO AND CAD COMPARISON:  Previous exam(s). ACR Breast Density Category b: There are scattered areas of fibroglandular density. FINDINGS: There are no findings suspicious for malignancy. Images were processed with CAD. IMPRESSION: No mammographic evidence of malignancy. A result letter of this screening mammogram will be mailed directly to the patient. RECOMMENDATION: Screening mammogram in one year. (Code:[id number]-B-01Y) BI-RADS CATEGORY  1: Negative. Electronically Signed   By: Audie Pinto M.D.   On: 07/19/2020 11:45   US THYROID  Result Date: 07/27/2020 CLINICAL DATA:  Heterogenous multinodular thyroid on recent CTA neck. EXAM: THYROID ULTRASOUND TECHNIQUE: Ultrasound examination of the thyroid gland and adjacent soft tissues was performed. COMPARISON:  CT 07/18/2020 FINDINGS: Parenchymal Echotexture: Mildly heterogenous Isthmus: 0.3 cm thickness Right lobe: 4.3 x 1.5 x 2.3 cm Left lobe: 4.2 x 2.2 x 2.2 cm _________________________________________________________ Estimated total number of nodules >/= 1 cm: 3 Number of spongiform nodules >/=  2 cm not described below (TR1): 0 Number of mixed cystic and solid nodules >/= 1.5 cm not described below ([place]): 0 _________________________________________________________ Nodule # 1: Location: Right; Mid Maximum size: 1.9 cm; Other 2 dimensions: 1.6 x 1.1 cm Composition: mixed cystic and solid (1) Echogenicity: isoechoic (1) Shape: not taller-than-wide (0) Margins: smooth (0) Echogenic foci: none (0) ACR TI-RADS total points: 2. ACR TI-RADS risk category: TR2 (2 points). ACR TI-RADS recommendations: This nodule does NOT meet TI-RADS criteria for biopsy or dedicated follow-up. _________________________________________________________ Nodule #  2: 0.7 cm mixed solid/cystic nodule, inferior right; This nodule does NOT meet TI-RADS criteria for biopsy or dedicated follow-up. Nodule # 3: Location: Left; Mid Maximum size: 2.6 cm; Other 2 dimensions: 0.9 x 2.1 cm Composition: solid/almost completely solid (2) Echogenicity: isoechoic (1) Shape: not taller-than-wide (0) Margins: smooth (0) Echogenic foci: none (0) ACR TI-RADS total points: 3. ACR TI-RADS risk category: TR3 (3 points). ACR TI-RADS recommendations: **Given size (>/= 2.5 cm) and appearance, fine needle aspiration of this mildly suspicious nodule should be considered based on TI-RADS criteria. _________________________________________________________ Nodule # 4: Location: Isthmus; Maximum  size: 2 cm; Other 2 dimensions: 1.7 x 0.2 cm Composition: mixed cystic and solid (1) Echogenicity: hypoechoic (2) Shape: not taller-than-wide (0) Margins: smooth (0) Echogenic foci: none (0) ACR TI-RADS total points: 3. ACR TI-RADS risk category: TR3 (3 points). ACR TI-RADS recommendations: *Given size (>/= 1.5 - 2.4 cm) and appearance, a follow-up ultrasound in 1 year should be considered based on TI-RADS criteria. _________________________________________________________ IMPRESSION: 1. Borderline thyromegaly with bilateral nodules. 2. Recommend FNA biopsy of mildly suspicious 2.6 cm mid left nodule. 3. Recommend annual/biennial ultrasound follow-up of additional nodules as above, until stability x5 years confirmed. The above is in keeping with the ACR TI-RADS recommendations - J Am Coll Radiol 2017;14:587-595. Electronically Signed   By: Lucrezia Europe M.D.   On: 07/27/2020 17:01     Assessment/Plan 1. Bilateral carotid artery stenosis Recommend:  Given the patient's asymptomatic subcritical stenosis no further invasive testing or surgery at this time.  Continue antiplatelet therapy as prescribed Continue management of CAD, HTN and Hyperlipidemia Healthy heart diet,  encouraged exercise at least 4 times per week Follow up in 6 months with duplex ultrasound and physical exam.    A total of 35 minutes was spent with this patient and greater than 50% was spent in counseling and coordination of care with the patient.  Discussion included the treatment options for vascular disease including indications for surgery and intervention.  Also discussed is the appropriate timing of treatment.  In addition medical therapy was discussed.  - VAS US CAROTID; Future  2. Chronic venous insufficiency No surgery or intervention at this point in time.  I have reviewed my discussion with the patient regarding venous insufficiency and why it causes symptoms. I have discussed with the patient the chronic skin changes  that accompany venous insufficiency and the long term sequela such as ulceration. Patient will contnue wearing graduated compression stockings on a daily basis, as this has provided excellent control of his edema. The patient will put the stockings on first thing in the morning and removing them in the evening. The patient is reminded not to sleep in the stockings.  In addition, behavioral modification including elevation during the day will be initiated. Exercise is strongly encouraged.  3. Essential hypertension Continue antihypertensive medications as already ordered, these medications have been reviewed and there are no changes at this time.   4. Hypertensive heart disease with chronic diastolic congestive heart failure (HCC) Continue cardiac and antihypertensive medications as already ordered and reviewed, no changes at this time.  Continue statin as ordered and reviewed, no changes at this time  Nitrates PRN for chest pain   5. Hyperlipidemia, unspecified hyperlipidemia type Continue statin as ordered and reviewed, no changes at this time    Hortencia Pilar, MD  08/14/2020 1:04 PM

## 2020-08-20 ENCOUNTER — Encounter (INDEPENDENT_AMBULATORY_CARE_PROVIDER_SITE_OTHER): Payer: Self-pay | Admitting: Vascular Surgery

## 2021-01-25 ENCOUNTER — Ambulatory Visit (INDEPENDENT_AMBULATORY_CARE_PROVIDER_SITE_OTHER): Payer: Medicare Other | Admitting: Vascular Surgery

## 2021-01-25 ENCOUNTER — Other Ambulatory Visit: Payer: Self-pay

## 2021-01-25 ENCOUNTER — Ambulatory Visit (INDEPENDENT_AMBULATORY_CARE_PROVIDER_SITE_OTHER): Payer: Medicare Other

## 2021-01-25 ENCOUNTER — Encounter (INDEPENDENT_AMBULATORY_CARE_PROVIDER_SITE_OTHER): Payer: Self-pay | Admitting: Vascular Surgery

## 2021-01-25 VITALS — BP 177/79 | HR 82 | Resp 16 | Wt 263.0 lb

## 2021-01-25 DIAGNOSIS — I872 Venous insufficiency (chronic) (peripheral): Secondary | ICD-10-CM

## 2021-01-25 DIAGNOSIS — I10 Essential (primary) hypertension: Secondary | ICD-10-CM

## 2021-01-25 DIAGNOSIS — I6523 Occlusion and stenosis of bilateral carotid arteries: Secondary | ICD-10-CM | POA: Diagnosis not present

## 2021-01-25 DIAGNOSIS — E119 Type 2 diabetes mellitus without complications: Secondary | ICD-10-CM

## 2021-01-25 DIAGNOSIS — E785 Hyperlipidemia, unspecified: Secondary | ICD-10-CM

## 2021-01-25 NOTE — Progress Notes (Signed)
MRN : [id number]  [name] is a 80 y.o. (1940-10-07) female who presents with chief complaint of No chief complaint on file. Marland Kitchen  History of Present Illness:   The patient is seen for evaluation of carotid stenosis. The carotid stenosis was identified after after a bruit was found.  CT angiogram was obtained which showed 60% RICA and minimal plaque in the LICA.   The patient denies amaurosis fugax. There is no recent history of TIA symptoms or focal motor deficits. There is no prior documented CVA.   There is no history of migraine headaches. There is no history of seizures.  She is now noting some left arm tremor.   The patient is taking enteric-coated aspirin 81 mg daily.   The patient has a history of coronary artery disease, no recent episodes of angina or shortness of breath. The patient denies PAD or claudication symptoms. There is a history of hyperlipidemia which is being treated with a statin.   Carotid Duplex today shows RICA 123456 and LICA 123456  No outpatient medications have been marked as taking for the 01/25/21 encounter (Appointment) with Delana Meyer, Dolores Lory, MD.    Past Medical History:  Diagnosis Date   Acquired hammertoe    other   Acquired hammertoe    Angina pectoris, unspecified ([place])    Angina pectoris, unspecified ([place])    Arthritis    knees   Breast cancer ([place]) 1990   left breast   Cancer ([place]) 1990,s   left breast   CHF (congestive heart failure) ([place])    Chronic kidney disease    stage 2   Degenerative joint disease    Dermatophytosis of nail    Diabetes mellitus type 2, uncomplicated ([place])    diet controlled   Dyspnea    Essential hypertension, benign    Exostosis    unspecified site   Fracture of right ankle 2010   Followed by Dr. Vickki Muff   GERD (gastroesophageal reflux disease)    Hemorrhoids    Hypercholesterolemia    Hyperlipidemia, unspecified    Hypertension    Lymphedema of leg    left leg worse than right leg.    Obesity, unspecified    Osteoarthrosis involving lower leg    unspecified whether generalized or localized    Pre-diabetes    Pure hypercholesterolemia    Vitamin D deficiency, unspecified    Wears dentures    partial upper    Past Surgical History:  Procedure Laterality Date   BREAST LUMPECTOMY Left 1995   BREAST SURGERY Left 1990   lumpectomy   BUNIONECTOMY Bilateral 1990's   plus hammer toe repair on left foot.   CATARACT EXTRACTION W/PHACO Left 08/10/2018   Procedure: CATARACT EXTRACTION PHACO AND INTRAOCULAR LENS PLACEMENT ([place])  LEFT DIABETIC;  Surgeon: Eulogio Bear, MD;  Location: [facility];  Service: Ophthalmology;  Laterality: Left;  diabetic - diet controlled   CATARACT EXTRACTION W/PHACO Right 11/16/2018   Procedure: CATARACT EXTRACTION PHACO AND INTRAOCULAR LENS PLACEMENT ([place])  RIGHT DIABETIC;  Surgeon: Eulogio Bear, MD;  Location: [facility];  Service: Ophthalmology;  Laterality: Right;  Diabetic - diet controlled   COLONOSCOPY     COLONOSCOPY WITH PROPOFOL N/A 05/08/2018   Procedure: COLONOSCOPY WITH PROPOFOL;  Surgeon: Lollie Sails, MD;  Location: [facility] ENDOSCOPY;  Service: Endoscopy;  Laterality: N/A;   ENDOMETRIAL BIOPSY     FOOT SURGERY Left    for bunionectomy and hammertoe   JOINT REPLACEMENT  TONSILLECTOMY     TOTAL KNEE ARTHROPLASTY Left 04/08/2017   Procedure: TOTAL KNEE ARTHROPLASTY;  Surgeon: Corky Mull, MD;  Location: [facility];  Service: Orthopedics;  Laterality: Left;   TOTAL KNEE ARTHROPLASTY Right 08/19/2017   Procedure: TOTAL KNEE ARTHROPLASTY;  Surgeon: Corky Mull, MD;  Location: [facility];  Service: Orthopedics;  Laterality: Right;    Social History Social History   Tobacco Use   Smoking status: Never   Smokeless tobacco: Never  Vaping Use   Vaping Use: Never used  Substance Use Topics   Alcohol use: Yes    Comment: 1-2 drinks every 1-2 months   Drug use: No    Family History Family History   Problem Relation Age of Onset   Stroke Mother    Hypertension Mother    Cancer Father    Prostate cancer Brother    Breast cancer Maternal Aunt 89   Breast cancer Cousin        mat cousin   Kidney cancer Neg Hx    Kidney disease Neg Hx     Allergies  Allergen Reactions   Colesevelam Other (See Comments)    constipation   Oxybutynin Swelling    Leg swelling   Rosuvastatin Other (See Comments)    Pain in lower extremities     REVIEW OF SYSTEMS (Negative unless checked)  Constitutional: '[]'$ Weight loss  '[]'$ Fever  '[]'$ Chills Cardiac: '[]'$ Chest pain   '[]'$ Chest pressure   '[]'$ Palpitations   '[]'$ Shortness of breath when laying flat   '[]'$ Shortness of breath with exertion. Vascular:  '[]'$ Pain in legs with walking   '[]'$ Pain in legs at rest  '[]'$ History of DVT   '[]'$ Phlebitis   '[]'$ Swelling in legs   '[]'$ Varicose veins   '[]'$ Non-healing ulcers Pulmonary:   '[]'$ Uses home oxygen   '[]'$ Productive cough   '[]'$ Hemoptysis   '[]'$ Wheeze  '[]'$ COPD   '[]'$ Asthma Neurologic:  '[]'$ Dizziness   '[]'$ Seizures   '[]'$ History of stroke   '[]'$ History of TIA  '[]'$ Aphasia   '[]'$ Vissual changes   '[]'$ Weakness or numbness in arm   '[]'$ Weakness or numbness in leg Musculoskeletal:   '[]'$ Joint swelling   '[]'$ Joint pain   '[]'$ Low back pain Hematologic:  '[]'$ Easy bruising  '[]'$ Easy bleeding   '[]'$ Hypercoagulable state   '[]'$ Anemic Gastrointestinal:  '[]'$ Diarrhea   '[]'$ Vomiting  '[]'$ Gastroesophageal reflux/heartburn   '[]'$ Difficulty swallowing. Genitourinary:  '[]'$ Chronic kidney disease   '[]'$ Difficult urination  '[]'$ Frequent urination   '[]'$ Blood in urine Skin:  '[]'$ Rashes   '[]'$ Ulcers  Psychological:  '[]'$ History of anxiety   '[]'$  History of major depression.  Physical Examination  There were no vitals filed for this visit. There is no height or weight on file to calculate BMI. Gen: WD/WN, NAD Head: [place]/AT, No temporalis wasting.  Ear/Nose/Throat: Hearing grossly intact, nares w/o erythema or drainage Eyes: PER, EOMI, sclera nonicteric.  Neck: Supple, no large masses.   Pulmonary:  Good air  movement, no audible wheezing bilaterally, no use of accessory muscles.  Cardiac: RRR, no JVD Vascular:   right carotid bruit;  scattered varicosities present bilaterally.  Mild venous stasis changes to the legs bilaterally.  2+ soft pitting edema  Vessel Right Left  Radial Palpable Palpable  PT Palpable Palpable  DP Palpable Palpable  Gastrointestinal: Non-distended. No guarding/no peritoneal signs.  Musculoskeletal: M/S 5/5 throughout.  No deformity or atrophy.  Neurologic: CN 2-12 intact. Symmetrical.  Speech is fluent. Motor exam as listed above. Psychiatric: Judgment intact, Mood & affect appropriate for pt's clinical situation. Dermatologic: Venouso rashes no ulcers noted.  No  changes consistent with cellulitis. Lymph : No lichenification or skin changes of chronic lymphedema.  CBC Lab Results  Component Value Date   WBC 5.2 04/14/2020   HGB 14.7 04/14/2020   HCT 44.5 04/14/2020   MCV 91.8 04/14/2020   PLT 164 04/14/2020    BMET    Component Value Date/Time   NA 141 04/14/2020 0910   K 4.1 04/14/2020 0910   CL 108 04/14/2020 0910   CO2 26 04/14/2020 0910   GLUCOSE 109 (H) 04/14/2020 0910   BUN 22 04/14/2020 0910   CREATININE 1.10 (H) 07/18/2020 1405   CALCIUM 9.7 04/14/2020 0910   GFRNONAA 39 (L) 04/14/2020 0910   GFRAA >60 08/22/2017 0601   CrCl cannot be calculated (Patient's most recent lab result is older than the maximum 21 days allowed.).  COAG Lab Results  Component Value Date   INR 1.00 08/08/2017   INR 1.01 03/12/2017    Radiology No results found.   Assessment/Plan 1. Bilateral carotid artery stenosis Recommend:  Given the patient's asymptomatic subcritical stenosis no further invasive testing or surgery at this time.  Duplex ultrasound shows 40-59% stenosis bilaterally.  Continue antiplatelet therapy as prescribed Continue management of CAD, HTN and Hyperlipidemia Healthy heart diet,  encouraged exercise at least 4 times per week Follow  up in 6 months with duplex ultrasound and physical exam.    - VAS US CAROTID; Future  2. Chronic venous insufficiency No surgery or intervention at this point in time.    Patient will wear graduated compression stockings on a daily basis a prescription was given. The patient will put the stockings on first thing in the morning and removing them in the evening. The patient is instructed specifically not to sleep in the stockings.    In addition, behavioral modification including several periods of elevation of the lower extremities during the day will be continued. I have demonstrated that proper elevation is a position with the ankles at heart level.  The patient is instructed to begin routine exercise, especially walking on a daily basis  Patient should undergo duplex ultrasound of the venous system to ensure that DVT or reflux is not present.  Following the review of the ultrasound the patient will follow up in 2-3 months to reassess the degree of swelling and the control that graduated compression stockings or compression wraps  is offering.   The patient can be assessed for a Lymph Pump at that time   3. Essential hypertension Continue antihypertensive medications as already ordered, these medications have been reviewed and there are no changes at this time.   4. Type 2 diabetes, diet controlled ([place]) Continue hypoglycemic medications as already ordered, these medications have been reviewed and there are no changes at this time.  Hgb A1C to be monitored as already arranged by primary service   5. Hyperlipidemia, unspecified hyperlipidemia type Continue statin as ordered and reviewed, no changes at this time     Hortencia Pilar, MD  01/25/2021 8:02 AM

## 2021-02-12 ENCOUNTER — Encounter (INDEPENDENT_AMBULATORY_CARE_PROVIDER_SITE_OTHER): Payer: Medicare Other

## 2021-02-12 ENCOUNTER — Ambulatory Visit (INDEPENDENT_AMBULATORY_CARE_PROVIDER_SITE_OTHER): Payer: Medicare Other | Admitting: Vascular Surgery

## 2021-04-16 ENCOUNTER — Ambulatory Visit
Admission: RE | Admit: 2021-04-16 | Discharge: 2021-04-16 | Disposition: A | Discharge status: Home / Self Care | Payer: Medicare Other | Source: Ambulatory Visit | Attending: Oncology | Admitting: Oncology

## 2021-04-16 ENCOUNTER — Other Ambulatory Visit: Payer: Self-pay

## 2021-04-16 ENCOUNTER — Other Ambulatory Visit: Payer: Self-pay | Admitting: Oncology

## 2021-04-16 DIAGNOSIS — K862 Cyst of pancreas: Secondary | ICD-10-CM | POA: Diagnosis present

## 2021-04-16 MED ORDER — GADOBUTROL 1 MMOL/ML IV SOLN
10.0000 mL | Freq: Once | INTRAVENOUS | Status: AC | PRN
  Administered 2021-04-16: 10 mL via INTRAVENOUS

## 2021-04-18 ENCOUNTER — Inpatient Hospital Stay: Payer: Medicare Other

## 2021-04-18 ENCOUNTER — Inpatient Hospital Stay: Payer: Medicare Other | Admitting: Oncology

## 2021-04-19 ENCOUNTER — Other Ambulatory Visit: Payer: Self-pay

## 2021-04-19 DIAGNOSIS — D7389 Other diseases of spleen: Secondary | ICD-10-CM

## 2021-04-19 DIAGNOSIS — R978 Other abnormal tumor markers: Secondary | ICD-10-CM

## 2021-04-19 DIAGNOSIS — R911 Solitary pulmonary nodule: Secondary | ICD-10-CM

## 2021-04-19 DIAGNOSIS — K862 Cyst of pancreas: Secondary | ICD-10-CM

## 2021-04-20 ENCOUNTER — Other Ambulatory Visit: Payer: Self-pay

## 2021-04-20 ENCOUNTER — Inpatient Hospital Stay: Payer: Medicare Other | Attending: Oncology

## 2021-04-20 ENCOUNTER — Encounter: Payer: Self-pay | Admitting: Oncology

## 2021-04-20 ENCOUNTER — Inpatient Hospital Stay (HOSPITAL_BASED_OUTPATIENT_CLINIC_OR_DEPARTMENT_OTHER): Payer: Medicare Other | Admitting: Oncology

## 2021-04-20 VITALS — BP 131/75 | HR 56 | Temp 96.4°F | Resp 18 | Wt 269.0 lb

## 2021-04-20 DIAGNOSIS — Z8042 Family history of malignant neoplasm of prostate: Secondary | ICD-10-CM | POA: Diagnosis not present

## 2021-04-20 DIAGNOSIS — I509 Heart failure, unspecified: Secondary | ICD-10-CM | POA: Diagnosis not present

## 2021-04-20 DIAGNOSIS — Z803 Family history of malignant neoplasm of breast: Secondary | ICD-10-CM | POA: Insufficient documentation

## 2021-04-20 DIAGNOSIS — R7989 Other specified abnormal findings of blood chemistry: Secondary | ICD-10-CM | POA: Insufficient documentation

## 2021-04-20 DIAGNOSIS — R978 Other abnormal tumor markers: Secondary | ICD-10-CM

## 2021-04-20 DIAGNOSIS — K862 Cyst of pancreas: Secondary | ICD-10-CM | POA: Diagnosis present

## 2021-04-20 DIAGNOSIS — R911 Solitary pulmonary nodule: Secondary | ICD-10-CM | POA: Insufficient documentation

## 2021-04-20 DIAGNOSIS — I130 Hypertensive heart and chronic kidney disease with heart failure and stage 1 through stage 4 chronic kidney disease, or unspecified chronic kidney disease: Secondary | ICD-10-CM | POA: Insufficient documentation

## 2021-04-20 DIAGNOSIS — N189 Chronic kidney disease, unspecified: Secondary | ICD-10-CM | POA: Diagnosis not present

## 2021-04-20 DIAGNOSIS — E1122 Type 2 diabetes mellitus with diabetic chronic kidney disease: Secondary | ICD-10-CM | POA: Insufficient documentation

## 2021-04-20 LAB — CBC WITH DIFFERENTIAL/PLATELET
Abs Immature Granulocytes: 0.02 10*3/uL (ref 0.00–0.07)
Basophils Absolute: 0 10*3/uL (ref 0.0–0.1)
Basophils Relative: 1 %
Eosinophils Absolute: 0.1 10*3/uL (ref 0.0–0.5)
Eosinophils Relative: 2 %
HCT: 44.1 % (ref 36.0–46.0)
Hemoglobin: 14.8 g/dL (ref 12.0–15.0)
Immature Granulocytes: 0 %
Lymphocytes Relative: 33 %
Lymphs Abs: 1.9 10*3/uL (ref 0.7–4.0)
MCH: 31 pg (ref 26.0–34.0)
MCHC: 33.6 g/dL (ref 30.0–36.0)
MCV: 92.5 fL (ref 80.0–100.0)
Monocytes Absolute: 0.5 10*3/uL (ref 0.1–1.0)
Monocytes Relative: 8 %
Neutro Abs: 3.3 10*3/uL (ref 1.7–7.7)
Neutrophils Relative %: 56 %
Platelets: 172 10*3/uL (ref 150–400)
RBC: 4.77 MIL/uL (ref 3.87–5.11)
RDW: 12.7 % (ref 11.5–15.5)
WBC: 5.8 10*3/uL (ref 4.0–10.5)
nRBC: 0 % (ref 0.0–0.2)

## 2021-04-20 LAB — COMPREHENSIVE METABOLIC PANEL
ALT: 11 U/L (ref 0–44)
AST: 17 U/L (ref 15–41)
Albumin: 3.9 g/dL (ref 3.5–5.0)
Alkaline Phosphatase: 90 U/L (ref 38–126)
Anion gap: 5 (ref 5–15)
BUN: 20 mg/dL (ref 8–23)
CO2: 28 mmol/L (ref 22–32)
Calcium: 9.6 mg/dL (ref 8.9–10.3)
Chloride: 106 mmol/L (ref 98–111)
Creatinine, Ser: 1.03 mg/dL — ABNORMAL HIGH (ref 0.44–1.00)
GFR, Estimated: 55 mL/min — ABNORMAL LOW (ref >60)
Glucose, Bld: 109 mg/dL — ABNORMAL HIGH (ref 70–99)
Potassium: 3.9 mmol/L (ref 3.5–5.1)
Sodium: 139 mmol/L (ref 135–145)
Total Bilirubin: 0.9 mg/dL (ref 0.3–1.2)
Total Protein: 7 g/dL (ref 6.5–8.1)

## 2021-04-20 NOTE — Progress Notes (Signed)
Hematology/Oncology progress  note [facility] [phone] [phone]   Patient Care Team: [place] as PCP - General  REFERRING PROVIDER: [place] REASON FOR VISIT:  Follow-up for spleen lesion.  HISTORY OF PRESENTING ILLNESS:  [name] is a  80 y.o.  female with PMH listed below who was referred to me for evaluation of splenomegaly.  Patient recently has CT chest w contrast done for work up of cough. CT chest showed bilateral bronchiectasis, lung nodules and thyroid nodules.. Incidental findings of ?kidney mass, not reported on radiologist. Subsequently CT abdomen pelvis was done which showed 2.3 lateral left upper pole renal cyst. Incidental finding of 4.9 cm rounded enhancing lesion in the anterior spleen, now from previous study in 2010. Patient was referred to [facility] for further evaluation.   #  seen by me on 07/03/2018.  At that time patient wants to be referred to [facility] oncology for further evaluation.  Patient called and requested another appointment for discussion of her CT scan that was done in November 2020.  09/15/2018 PET scan at [facility] showed no focal lesions are discernible in the spleen. NO other hypermetabolic activity in chest abdomen pelvis to suggest malignancy.  Patient had a follow up CT done on 05/25/2019 CT abdomen pelvis w contrast showed indeterminate splenic lesion has no interval changes.  53m low attenudating lesion from the tai of the pancrease similar to prior CT, likely a side branch IPMN. Colonic diverticulosis  # 11/01/2019, [facility]  Patient was recommended to either proceed with endoscopy ultrasound/fine-needle aspiration or reimage in 6 months time by pancreatic protocol MRI was recommended.  Decision was made to continue follow-up with interval images and tumor markers.  INTERVAL HISTORY Wanda FISCHBACHis a 80y.o. female who has above history  reviewed by me today presents for follow up visit for spleen lesion and pancreatic lesion.  Patient has had MRI MRCP abdomen done.  She was also recommended to have a CT chest done for follow-up of small nodules in the lungs.  CT was not done.  Today patient reports feeling well.  No new complaints.  Denies any abdominal pain, unintentional weight loss.  She has gained weight and plans to further discuss with her primary care provider about weight loss options. Chronic back pain.  Review of Systems  Constitutional:  Negative for appetite change, chills, fatigue, fever and unexpected weight change.  HENT:   Negative for hearing loss and voice change.   Eyes:  Negative for eye problems.  Respiratory:  Negative for chest tightness and cough.   Cardiovascular:  Negative for chest pain.  Gastrointestinal:  Negative for abdominal distention, abdominal pain and blood in stool.  Endocrine: Negative for hot flashes.  Genitourinary:  Negative for difficulty urinating and frequency.   Musculoskeletal:  Positive for back pain. Negative for arthralgias.  Skin:  Negative for itching and rash.  Neurological:  Negative for extremity weakness.  Hematological:  Negative for adenopathy.  Psychiatric/Behavioral:  Negative for confusion.    MEDICAL HISTORY:  Past Medical History:  Diagnosis Date   Acquired hammertoe    other   Acquired hammertoe    Angina pectoris, unspecified ([place]    Angina p[place]is, unspecified ([place]    Arthritis    knees   [place]ast [place] ([place] 1990   left breast   Cancer ([place],s   left breast   CHF (congestive heart failure) ([place]  Chronic kidney disease    stage 2   Degenerative joint disease    Dermatophytosis of nail    Diabetes mellitus type 2, uncomplicated (HCC)    diet controlled   Dyspnea    Essential hypertension, benign    Exostosis    unspecified site   Fracture of right ankle 2010   Followed by Dr. Vickki Muff   GERD (gastroesophageal reflux disease)     Hemorrhoids    Hypercholesterolemia    Hyperlipidemia, unspecified    Hypertension    Lymphedema of leg    left leg worse than right leg.   Obesity, unspecified    Osteoarthrosis involving lower leg    unspecified whether generalized or localized    Pre-diabetes    Pure hypercholesterolemia    Vitamin D deficiency, unspecified    Wears dentures    partial upper    SURGICAL HISTORY: Past Surgical History:  Procedure Laterality Date   BREAST LUMPECTOMY Left 1995   BREAST SURGERY Left 1990   lumpectomy   BUNIONECTOMY Bilateral 1990's   plus hammer toe repair on left foot.   CATARACT EXTRACTION W/PHACO Left 08/10/2018   Procedure: CATARACT EXTRACTION PHACO AND INTRAOCULAR LENS PLACEMENT ([place])  LEFT DIABETIC;  Surgeon: Eulogio Bear, MD;  Location: [place];  Service: Ophthalmology;  Laterality: Left;  diabetic - diet controlled   CATARACT EXTRACTION W/PHACO Right 11/16/2018   Procedure: CATARACT EXTRACTION PHACO AND INTRAOCULAR LENS PLACEMENT ([place])  RIGHT DIABETIC;  Surgeon: Eulogio Bear, MD;  Location: [place];  Service: Ophthalmology;  Laterality: Right;  Diabetic - diet controlled   COLONOSCOPY     COLONOSCOPY WITH PROPOFOL N/A 05/08/2018   Procedure: COLONOSCOPY WITH PROPOFOL;  Surgeon: Lollie Sails, MD;  Location[facility] ENDOSCOPY;  Service: Endoscopy;  Laterality: N/A;   ENDOMETRIAL BIOPSY     FOOT SURGERY Left    for bunionectomy and hammertoe   JOINT REPLACEMENT     TONSILLECTOMY     TOTAL KNEE ARTHROPLASTY Left 04/08/2017   Procedure: TOTAL KNEE ARTHROPLASTY;  Surgeon: Corky Mull, MD;  Location: [facility];  Service: Orthopedics;  Laterality: Left;   TOTAL KNEE ARTHROPLASTY Right 08/19/2017   Procedure: TOTAL KNEE ARTHROPLASTY;  Surgeon: Corky Mull, MD;  Location: [facility];  Service: Orthopedics;  Laterality: Right;    SOCIAL HISTORY: Social History   Socioeconomic History   Marital status: Widowed    Spouse name: Not on file    Number of children: 2   Years of education: Not on file   Highest education level: Not on file  Occupational History   Occupation: Retired  Tobacco Use   Smoking status: Never   Smokeless tobacco: Never  Vaping Use   Vaping Use: Never used  Substance and Sexual Activity   Alcohol use: Yes    Comment: 1-2 drinks every 1-2 months   Drug use: No   Sexual activity: Not on file  Other Topics Concern   Not on file  Social History Narrative   Not on file   Social Determinants of Health   Financial Resource Strain: Not on file  Food Insecurity: Not on file  Transportation Needs: Not on file  Physical Activity: Not on file  Stress: Not on file  Social Connections: Not on file  Intimate Partner Violence: Not on file    FAMILY HISTORY: Family History  Problem Relation Age of Onset   Stroke Mother    Hypertension Mother    Cancer Father  Prostate cancer Brother    Breast cancer Maternal Aunt 89   Breast cancer Cousin        mat cousin   Kidney cancer Neg Hx    Kidney disease Neg Hx     ALLERGIES:  is allergic to colesevelam; oxybutynin; rosuvastatin; and zoster vaccine recombinant, adjuvanted.  MEDICATIONS:  Current Outpatient Medications  Medication Sig Dispense Refill   atorvastatin (LIPITOR) 20 MG tablet Take 20 mg by mouth at bedtime.      azithromycin (ZITHROMAX) 250 MG tablet Take by mouth 3 (three) times a week.     Blood Glucose Monitoring Suppl (FIFTY50 GLUCOSE METER 2.0) w/Device KIT Use as directed BAYER CONTOUR NEXT METER DX: E11.9     CONTOUR NEXT TEST test strip      ergocalciferol (VITAMIN D2) 1.25 MG (50000 UT) capsule Take 50,000 Units by mouth once a week.     Fluticasone-Umeclidin-Vilant (TRELEGY ELLIPTA) 100-62.5-25 MCG/INH AEPB Inhale into the lungs as needed.     gabapentin (NEURONTIN) 300 MG capsule Take 300 mg by mouth at bedtime.     hydrocortisone (ANUSOL-HC) 2.5 % rectal cream Place 1 application rectally 2 (two) times daily.     magnesium  oxide (MAG-OX) 400 MG tablet Take 800 mg by mouth daily.      nystatin cream (MYCOSTATIN) Apply 1 application topically 2 (two) times daily as needed. Apply thin film to yeast rash under the breasts     Omega-3 Krill Oil 500 MG CAPS Take 500 mg by mouth 3 (three) times a week. On Monday Wednesday and Friday     omeprazole (PRILOSEC) 20 MG capsule Take 20 mg by mouth daily. Acid reflux      sennosides-docusate sodium (SENOKOT-S) 8.6-50 MG tablet Take 2 tablets by mouth 2 (two) times daily.      TOPROL XL 25 MG 24 hr tablet Take 25 mg by mouth at bedtime.     triamterene-hydrochlorothiazide (DYAZIDE) 37.5-25 MG capsule Take 1 capsule by mouth daily.     acetaminophen (TYLENOL) 325 MG tablet Take 1,000 mg by mouth 2 (two) times daily.      albuterol (PROVENTIL HFA;VENTOLIN HFA) 108 (90 Base) MCG/ACT inhaler Inhale 2 puffs into the lungs every 6 (six) hours as needed for wheezing or shortness of breath. (Patient not taking: Reported on 04/20/2021) 1 Inhaler 0   aspirin EC 81 MG tablet Take 81 mg by mouth daily. (Patient not taking: Reported on 04/20/2021)     furosemide (LASIX) 40 MG tablet Take one tablet by mouth once daily as needed for lower extremity edema (Patient not taking: No sig reported)     potassium chloride (K-DUR,KLOR-CON) 10 MEQ tablet Take 10 mEq by mouth daily. (Patient not taking: Reported on 04/20/2021)     solifenacin (VESICARE) 5 MG tablet Take 5 mg by mouth daily. (Patient not taking: Reported on 04/20/2021)     traMADol (ULTRAM) 50 MG tablet Take 50 mg by mouth every 6 (six) hours as needed. (Patient not taking: No sig reported)     No current facility-administered medications for this visit.     PHYSICAL EXAMINATION: ECOG PERFORMANCE STATUS: 1 - Symptomatic but completely ambulatory Vitals:   04/20/21 1025  BP: 131/75  Pulse: (!) 56  Resp: 18  Temp: (!) 96.4 F (35.8 C)   Filed Weights   04/20/21 1025  Weight: 269 lb (122 kg)    Physical Exam Constitutional:       General: She is not in acute distress.  Appearance: She is obese.  HENT:     Head: Normocephalic and atraumatic.  Eyes:     General: No scleral icterus.    Pupils: Pupils are equal, round, and reactive to light.  Cardiovascular:     Rate and Rhythm: Normal rate and regular rhythm.     Heart sounds: Normal heart sounds.  Pulmonary:     Effort: Pulmonary effort is normal. No respiratory distress.     Breath sounds: No wheezing.  Abdominal:     General: Bowel sounds are normal. There is no distension.     Palpations: Abdomen is soft. There is no mass.     Tenderness: There is no abdominal tenderness.  Musculoskeletal:        General: No deformity. Normal range of motion.     Cervical back: Normal range of motion and neck supple.     Comments: Bilateral lower extremity trace edema chronic  Skin:    General: Skin is warm and dry.     Findings: No erythema or rash.  Neurological:     Mental Status: She is alert and oriented to person, place, and time. Mental status is at baseline.     Cranial Nerves: No cranial nerve deficit.     Coordination: Coordination normal.  Psychiatric:        Mood and Affect: Mood normal.        Behavior: Behavior normal.        Thought Content: Thought content normal.     LABORATORY DATA:  I have reviewed the data as listed Lab Results  Component Value Date   WBC 5.8 04/20/2021   HGB 14.8 04/20/2021   HCT 44.1 04/20/2021   MCV 92.5 04/20/2021   PLT 172 04/20/2021   Recent Labs    07/18/20 1405 04/20/21 0946  NA  --  139  K  --  3.9  CL  --  106  CO2  --  28  GLUCOSE  --  109*  BUN  --  20  CREATININE 1.10* 1.03*  CALCIUM  --  9.6  GFRNONAA  --  55*  PROT  --  7.0  ALBUMIN  --  3.9  AST  --  17  ALT  --  11  ALKPHOS  --  90  BILITOT  --  0.9    Iron/TIBC/Ferritin/ %Sat No results found for: IRON, TIBC, FERRITIN, IRONPCTSAT   RADIOGRAPHIC STUDIES: I have personally reviewed the radiological images as listed and agreed  with the findings in the report. MR 3D Recon At Scanner  Result Date: 04/16/2021 CLINICAL DATA:  Follow-up pancreatic cystic lesion. EXAM: MRI ABDOMEN WITHOUT AND WITH CONTRAST (INCLUDING MRCP) TECHNIQUE: Multiplanar multisequence MR imaging of the abdomen was performed both before and after the administration of intravenous contrast. Heavily T2-weighted images of the biliary and pancreatic ducts were obtained, and three-dimensional MRCP images were rendered by post processing. CONTRAST:  65m GADAVIST GADOBUTROL 1 MMOL/ML IV SOLN COMPARISON:  Multiple priors including most recent MRI April 14, 2020 FINDINGS: Lower chest: No acute abnormality. Hepatobiliary: No suspicious hepatic lesion. Gallbladder is unremarkable. No biliary ductal dilation. Pancreas: Cystic lesion in the tail the pancreas is stable in size dating back to 05/25/2018 on today study measuring 1.6 x 1.3 x 1.2 cm in on most recent prior measuring 1.7 x 1.5 x 1.1 cm. The lesion demonstrates a few thin internal septations but without suspicious enhancement. No pancreatic duct dilation. No additional pancreatic lesions identified. Spleen: Normal size spleen. The solid  enhancing splenic mass is stable in size measuring 5.4 x 4.2 cm on image 9/4 previously measuring 5.3 x 4.3 cm. The lesion demonstrates isointense T2 and intrinsic T1 signal with nearly isointense enhancement following splenic parenchyma and stable in size dating back to 2019. Adrenals/Urinary Tract: Bilateral adrenal glands are unremarkable. No hydronephrosis. 2.7 cm left upper pole renal cyst. No solid enhancing renal mass. Stomach/Bowel: Visualized portions within the abdomen are unremarkable. Vascular/Lymphatic: No pathologically enlarged lymph nodes identified. No abdominal aortic aneurysm demonstrated. Other:  No abdominal ascites. Musculoskeletal: No suspicious bone lesions identified. IMPRESSION: 1. 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 Recommend follow up pre and post contrast MRI/MRCP in 1 year. This recommendation follows ACR consensus guidelines: Management of Incidental Pancreatic Cysts: A White Paper of the ACR Incidental Findings Committee. J Am Coll Radiol 7588;32:549-826. 2. 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 Electronically Signed   By: Dahlia Bailiff M.D.   On: 04/16/2021 21:45   MR ABDOMEN MRCP W WO CONTAST  Result Date: 04/16/2021 CLINICAL DATA:  Follow-up pancreatic cystic lesion. EXAM: MRI ABDOMEN WITHOUT AND WITH CONTRAST (INCLUDING MRCP) TECHNIQUE: Multiplanar multisequence MR imaging of the abdomen was performed both before and after the administration of intravenous contrast. Heavily T2-weighted images of the biliary and pancreatic ducts were obtained, and three-dimensional MRCP images were rendered by post processing. CONTRAST:  22m GADAVIST GADOBUTROL 1 MMOL/ML IV SOLN COMPARISON:  Multiple priors including most recent MRI April 14, 2020 FINDINGS: Lower chest: No acute abnormality. Hepatobiliary: No suspicious hepatic lesion. Gallbladder is unremarkable. No biliary ductal dilation. Pancreas: Cystic lesion in the tail the pancreas is stable in size dating back to 05/25/2018 on today study measuring 1.6 x 1.3 x 1.2 cm in on most recent prior measuring 1.7 x 1.5 x 1.1 cm. The lesion demonstrates a few thin internal septations but without suspicious enhancement. No pancreatic duct dilation. No additional pancreatic lesions identified. Spleen: Normal size spleen. The solid enhancing splenic mass is stable in size measuring 5.4 x 4.2 cm on image 9/4 previously measuring 5.3 x 4.3 cm. The lesion demonstrates isointense T2 and intrinsic T1 signal with nearly isointense enhancement following splenic parenchyma and stable in size dating back to 2019. Adrenals/Urinary Tract: Bilateral adrenal glands are  unremarkable. No hydronephrosis. 2.7 cm left upper pole renal cyst. No solid enhancing renal mass. Stomach/Bowel: Visualized portions within the abdomen are unremarkable. Vascular/Lymphatic: No pathologically enlarged lymph nodes identified. No abdominal aortic aneurysm demonstrated. Other:  No abdominal ascites. Musculoskeletal: No suspicious bone lesions identified. IMPRESSION: 1. 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 Recommend follow up pre and post contrast MRI/MRCP in 1 year. This recommendation follows ACR consensus guidelines: Management of Incidental Pancreatic Cysts: A White Paper of the ACR Incidental Findings Committee. J Am Coll Radiol 24158;30:940-768 2. 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 Electronically Signed   By: JDahlia BailiffM.D.   On: 04/16/2021 21:45   VAS [place]  Result Date: 01/25/2021 Carotid Arterial Duplex Study [facility]:   01/25/2021 Medical Rec #: [id number]       Accession #:    [id number] of Birth: 3Nov 24, 1942  Patient Gender: F Patient Age:   66Y Exam Location:  [facility] Procedure:      VAS US CAROTID Referring Phys: [id number] [place] --------------------------------------------------------------------------------  Indications:   Carotid artery disease. Other Factors: 07/18/2020: CTA Neck suggestive of 60% stenosis. Performing Technologist: Almira Coaster RVS  Examination Guidelines: A complete evaluation includes B-mode imaging, spectral Doppler, color Doppler, and power Doppler as needed of all accessible portions of each vessel. Bilateral testing is considered an integral part of a complete examination. Limited examinations for reoccurring indications may be performed as noted.  Right Carotid Findings:  +----------+-------+-------+--------+---------------------------------+--------+           PSV    EDV    StenosisPlaque Description               Comments           cm/s   cm/s                                                     +----------+-------+-------+--------+---------------------------------+--------+ CCA Prox  83     13                                                       +----------+-------+-------+--------+---------------------------------+--------+ CCA Mid   82     15                                                       +----------+-------+-------+--------+---------------------------------+--------+ CCA Distal66     14                                                       +----------+-------+-------+--------+---------------------------------+--------+ ICA Prox  181    28             irregular, calcific and                                                   homogeneous                               +----------+-------+-------+--------+---------------------------------+--------+ ICA Mid   229    29                                                       +----------+-------+-------+--------+---------------------------------+--------+ ICA Distal85     14                                                       +----------+-------+-------+--------+---------------------------------+--------+  ECA       303    18             heterogenous, irregular and                                               calcific                                  +----------+-------+-------+--------+---------------------------------+--------+ +----------+--------+-------+--------+-------------------+           PSV cm/sEDV cmsDescribeArm Pressure (mmHG) +----------+--------+-------+--------+-------------------+ Subclavian80      0                                  +----------+--------+-------+--------+-------------------+  +---------+--------+--+--------+--+ VertebralPSV cm/s57EDV cm/s11 +---------+--------+--+--------+--+  Left Carotid Findings: +----------+-------+-------+--------+---------------------------------+--------+           PSV    EDV    StenosisPlaque Description               Comments           cm/s   cm/s                                                     +----------+-------+-------+--------+---------------------------------+--------+ CCA Prox  129    20                                                       +----------+-------+-------+--------+---------------------------------+--------+ CCA Mid   120    22                                                       +----------+-------+-------+--------+---------------------------------+--------+ CCA Distal112    22                                                       +----------+-------+-------+--------+---------------------------------+--------+ ICA Prox  150    26             irregular and calcific                    +----------+-------+-------+--------+---------------------------------+--------+ ICA Mid   160    21                                                       +----------+-------+-------+--------+---------------------------------+--------+ ICA Distal90     24                                                       +----------+-------+-------+--------+---------------------------------+--------+  ECA       193    13             heterogenous, irregular and                                               calcific                                  +----------+-------+-------+--------+---------------------------------+--------+ +----------+--------+--------+--------+-------------------+           PSV cm/sEDV cm/sDescribeArm Pressure (mmHG) +----------+--------+--------+--------+-------------------+ Subclavian142     0                                    +----------+--------+--------+--------+-------------------+ +---------+--------+---+--------+--+ VertebralPSV cm/s106EDV cm/s18 +---------+--------+---+--------+--+   Summary: Right Carotid: Velocities in the right ICA are consistent with a 40-59%                stenosis. The ECA appears >50% stenosed. Left Carotid: Velocities in the left ICA are consistent with a 40-59% stenosis.               The ECA appears >50% stenosed. Vertebrals:  Bilateral vertebral arteries demonstrate antegrade flow. Subclavians: Normal flow hemodynamics were seen in bilateral subclavian              arteries. *See table(s) above for measurements and observations.  Electronically signed by Hortencia Pilar MD on 01/25/2021 at 4:23:02 PM.    Final      ASSESSMENT & PLAN:  1. Lung nodule   2. Elevated serum creatinine   3. 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  MRI/MRCP showed a 1.7 x 1.4 x 1.2 cm cystic lesion, likely  IPMN [facility] done by Dr. KMaudie Mercury 04/16/2021, MRI MRCP of the abdomen showed a stable size of the cystic lesion in the tail of the pancreas.  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  Recommend to repeat MRI/MRCP in 1 year. Recommendation was discussed with patient.  RepeatImaging in 1 year. CA 19-9 and CEA have been monitored.  Today's levels are pending.  # Spleen lesion, MRI showed stable spleen lesion likely benign hematoma.  This lesion was FDG negative on previous PET scan.  #Lung nodules, Patient had CT chest done on 04/23/2018 which showed lung nodules  I have ordered a CT to be done last year.  Patient has not get a chance to do that yet.  We will schedule.  #Elevated creatinine,Elevated creatinine.  Patient most likely have chronic kidney disease.  Creatinine stable.  Encourage oral hydration.  We spent sufficient time to discuss many aspect of care, questions were answered to patient's  satisfaction.  All questions were answered. The patient knows to call the clinic with any problems questions or concerns.  Return of visit: 12  months.  ZEarlie Server MD, PhD Hematology Oncology [facility] Centerat AC[facility] St Joseph[facility] HospitalPager- [phone]/21/2022

## 2021-04-20 NOTE — Progress Notes (Signed)
Patient denies new problems/concerns today.   °

## 2021-04-21 LAB — CANCER ANTIGEN 19-9: CA 19-9: 11 U/mL (ref 0–35)

## 2021-04-21 LAB — CEA: CEA: 2 ng/mL (ref 0.0–4.7)

## 2021-05-14 ENCOUNTER — Ambulatory Visit: Payer: Medicare Other | Attending: Oncology

## 2021-05-21 DIAGNOSIS — K625 Hemorrhage of anus and rectum: Secondary | ICD-10-CM | POA: Insufficient documentation

## 2021-06-14 ENCOUNTER — Other Ambulatory Visit: Payer: Self-pay | Admitting: Internal Medicine

## 2021-06-14 DIAGNOSIS — Z1231 Encounter for screening mammogram for malignant neoplasm of breast: Secondary | ICD-10-CM

## 2021-07-09 ENCOUNTER — Ambulatory Visit (INDEPENDENT_AMBULATORY_CARE_PROVIDER_SITE_OTHER): Payer: Medicare Other | Admitting: Vascular Surgery

## 2021-07-26 ENCOUNTER — Ambulatory Visit (INDEPENDENT_AMBULATORY_CARE_PROVIDER_SITE_OTHER): Payer: Medicare Other | Admitting: Nurse Practitioner

## 2021-07-26 ENCOUNTER — Encounter (INDEPENDENT_AMBULATORY_CARE_PROVIDER_SITE_OTHER): Payer: Medicare Other

## 2021-08-10 ENCOUNTER — Other Ambulatory Visit: Payer: Self-pay

## 2021-08-10 ENCOUNTER — Ambulatory Visit (INDEPENDENT_AMBULATORY_CARE_PROVIDER_SITE_OTHER): Payer: Medicare Other | Admitting: Nurse Practitioner

## 2021-08-10 ENCOUNTER — Ambulatory Visit (INDEPENDENT_AMBULATORY_CARE_PROVIDER_SITE_OTHER): Payer: Medicare Other

## 2021-08-10 ENCOUNTER — Other Ambulatory Visit (INDEPENDENT_AMBULATORY_CARE_PROVIDER_SITE_OTHER): Payer: Self-pay | Admitting: Vascular Surgery

## 2021-08-10 VITALS — BP 162/79 | HR 73 | Ht 71.0 in | Wt 257.0 lb

## 2021-08-10 DIAGNOSIS — M79604 Pain in right leg: Secondary | ICD-10-CM | POA: Diagnosis not present

## 2021-08-10 DIAGNOSIS — M79605 Pain in left leg: Secondary | ICD-10-CM | POA: Diagnosis not present

## 2021-08-10 DIAGNOSIS — I679 Cerebrovascular disease, unspecified: Secondary | ICD-10-CM

## 2021-08-10 DIAGNOSIS — I6523 Occlusion and stenosis of bilateral carotid arteries: Secondary | ICD-10-CM

## 2021-08-10 DIAGNOSIS — E119 Type 2 diabetes mellitus without complications: Secondary | ICD-10-CM

## 2021-08-13 ENCOUNTER — Other Ambulatory Visit (INDEPENDENT_AMBULATORY_CARE_PROVIDER_SITE_OTHER): Payer: Self-pay | Admitting: Vascular Surgery

## 2021-08-13 ENCOUNTER — Other Ambulatory Visit (INDEPENDENT_AMBULATORY_CARE_PROVIDER_SITE_OTHER): Payer: Self-pay | Admitting: Nurse Practitioner

## 2021-08-13 DIAGNOSIS — I739 Peripheral vascular disease, unspecified: Secondary | ICD-10-CM

## 2021-08-13 DIAGNOSIS — M79605 Pain in left leg: Secondary | ICD-10-CM

## 2021-08-13 DIAGNOSIS — M79604 Pain in right leg: Secondary | ICD-10-CM

## 2021-08-14 ENCOUNTER — Ambulatory Visit (INDEPENDENT_AMBULATORY_CARE_PROVIDER_SITE_OTHER): Payer: Medicare Other | Admitting: Nurse Practitioner

## 2021-08-14 ENCOUNTER — Ambulatory Visit (INDEPENDENT_AMBULATORY_CARE_PROVIDER_SITE_OTHER): Payer: Medicare Other

## 2021-08-14 ENCOUNTER — Other Ambulatory Visit: Payer: Self-pay

## 2021-08-14 VITALS — BP 146/80 | HR 61 | Ht 71.0 in | Wt 258.0 lb

## 2021-08-14 DIAGNOSIS — E119 Type 2 diabetes mellitus without complications: Secondary | ICD-10-CM

## 2021-08-14 DIAGNOSIS — I6523 Occlusion and stenosis of bilateral carotid arteries: Secondary | ICD-10-CM | POA: Diagnosis not present

## 2021-08-14 DIAGNOSIS — M79605 Pain in left leg: Secondary | ICD-10-CM | POA: Diagnosis not present

## 2021-08-14 DIAGNOSIS — I739 Peripheral vascular disease, unspecified: Secondary | ICD-10-CM | POA: Diagnosis not present

## 2021-08-14 DIAGNOSIS — I10 Essential (primary) hypertension: Secondary | ICD-10-CM | POA: Diagnosis not present

## 2021-08-14 DIAGNOSIS — M79604 Pain in right leg: Secondary | ICD-10-CM

## 2021-08-19 ENCOUNTER — Encounter (INDEPENDENT_AMBULATORY_CARE_PROVIDER_SITE_OTHER): Payer: Self-pay | Admitting: Nurse Practitioner

## 2021-08-19 NOTE — Progress Notes (Signed)
Subjective:    Patient ID: [name], female    DOB: 1940-09-18, 81 y.o.   MRN: [id number] Chief Complaint  Patient presents with   Follow-up    Carotids U/S    The patient is seen for follow up evaluation of carotid stenosis. The carotid stenosis followed by ultrasound.   The patient denies amaurosis fugax. There is no recent history of TIA symptoms or focal motor deficits. There is no prior documented CVA.  The patient is taking enteric-coated aspirin 81 mg daily.  There is no history of migraine headaches. There is no history of seizures.  The patient has a history of coronary artery disease, no recent episodes of angina or shortness of breath. The patient denies PAD or claudication symptoms.  However, the patient notes that she has been having pain in her lower extremities and weakness.  She also notes that the pain is worse at night and wakes her up during the evening. There is a history of hyperlipidemia which is being treated with a statin.    Carotid Duplex done today shows 40 to 59% stenosis on the right and 1 to 39% on the left.  Previous study showed 40 to 59% bilaterally.   Review of Systems  Cardiovascular:  Positive for leg swelling.  Neurological:  Positive for weakness.  All other systems reviewed and are negative.     Objective:   Physical Exam Vitals reviewed.  HENT:     Head: Normocephalic.  Neck:     Vascular: No carotid bruit.  Cardiovascular:     Rate and Rhythm: Normal rate.     Pulses: Decreased pulses.  Pulmonary:     Effort: Pulmonary effort is normal.  Skin:    General: Skin is warm and dry.  Neurological:     Mental Status: She is alert and oriented to person, place, and time.  Psychiatric:        Mood and Affect: Mood normal.        Behavior: Behavior normal.        Thought Content: Thought content normal.        Judgment: Judgment normal.    BP (!) 162/79    Pulse 73    Ht 5' 11"  (1.803 m)    Wt 257 lb (116.6 kg)    BMI 35.84  kg/m   Past Medical History:  Diagnosis Date   Acquired hammertoe    other   Acquired hammertoe    Angina pectoris, unspecified (HCC)    Angina pectoris, unspecified ([place])    Arthritis    knees   Breast cancer ([facility]) 1990   left breast   Cancer ([facility]) 1990,s   left breast   CHF (congestive heart failure) (HCC)    Chronic kidney disease    stage 2   Degenerative joint disease    Dermatophytosis of nail    Diabetes mellitus type 2, uncomplicated (HCC)    diet controlled   Dyspnea    Essential hypertension, benign    Exostosis    unspecified site   Fracture of right ankle 2010   Followed by Dr. Vickki Muff   GERD (gastroesophageal reflux disease)    Hemorrhoids    Hypercholesterolemia    Hyperlipidemia, unspecified    Hypertension    Lymphedema of leg    left leg worse than right leg.   Obesity, unspecified    Osteoarthrosis involving lower leg    unspecified whether generalized or localized    Pre-diabetes  Pure hypercholesterolemia    Vitamin D deficiency, unspecified    Wears dentures    partial upper    Social History   Socioeconomic History   Marital status: Widowed    Spouse name: Not on file   Number of children: 2   Years of education: Not on file   Highest education level: Not on file  Occupational History   Occupation: Retired  Tobacco Use   Smoking status: Never   Smokeless tobacco: Never  Vaping Use   Vaping Use: Never used  Substance and Sexual Activity   Alcohol use: Yes    Comment: 1-2 drinks every 1-2 months   Drug use: No   Sexual activity: Not on file  Other Topics Concern   Not on file  Social History Narrative   Not on file   Social Determinants of Health   Financial Resource Strain: Not on file  Food Insecurity: Not on file  Transportation Needs: Not on file  Physical Activity: Not on file  Stress: Not on file  Social Connections: Not on file  Intimate Partner Violence: Not on file    Past Surgical History:  Procedure  Laterality Date   BREAST LUMPECTOMY Left 1995   BREAST SURGERY Left 1990   lumpectomy   BUNIONECTOMY Bilateral 1990's   plus hammer toe repair on left foot.   CATARACT EXTRACTION W/PHACO Left 08/10/2018   Procedure: CATARACT EXTRACTION PHACO AND INTRAOCULAR LENS PLACEMENT ([facility])  LEFT DIABETIC;  Surgeon: Eulogio Bear, MD;  Location: [place];  Service: Ophthalmology;  Laterality: Left;  diabetic - diet controlled   CATARACT EXTRACTION W/PHACO Right 11/16/2018   Procedure: CATARACT EXTRACTION PHACO AND INTRAOCULAR LENS PLACEMENT ([place])  RIGHT DIABETIC;  Surgeon: Eulogio Bear, MD;  Location: [facility];  Service: Ophthalmology;  Laterality: Right;  Diabetic - diet controlled   COLONOSCOPY     COLONOSCOPY WITH PROPOFOL N/A 05/08/2018   Procedure: COLONOSCOPY WITH PROPOFOL;  Surgeon: Lollie Sails, MD;  Location: [facility];  Service: Endoscopy;  Laterality: N/A;   ENDOMETRIAL BIOPSY     FOOT SURGERY Left    for bunionectomy and hammertoe   JOINT REPLACEMENT     TONSILLECTOMY     TOTAL KNEE ARTHROPLASTY Left 04/08/2017   Procedure: TOTAL KNEE ARTHROPLASTY;  Surgeon: Corky Mull, MD;  Location: [facility];  Service: Orthopedics;  Laterality: Left;   TOTAL KNEE ARTHROPLASTY Right 08/19/2017   Procedure: TOTAL KNEE ARTHROPLASTY;  Surgeon: Corky Mull, MD;  Location: [facility];  Service: Orthopedics;  Laterality: Right;    Family History  Problem Relation Age of Onset   Stroke Mother    Hypertension Mother    Cancer Father    Prostate cancer Brother    Breast cancer Maternal Aunt 89   Breast cancer Cousin        mat cousin   Kidney cancer Neg Hx    Kidney disease Neg Hx     Allergies  Allergen Reactions   Colesevelam Other (See Comments)    constipation   Oxybutynin Swelling    Leg swelling   Rosuvastatin Other (See Comments)    Pain in lower extremities   Zoster Vaccine Recombinant, Adjuvanted Hives    CBC Latest Ref Rng & Units 04/20/2021  04/14/2020 06/11/2018  WBC 4.0 - 10.5 K/uL 5.8 5.2 4.9  Hemoglobin 12.0 - 15.0 g/dL 14.8 14.7 14.4  Hematocrit 36.0 - 46.0 % 44.1 44.5 44.9  Platelets 150 - 400 K/uL 172 164 177  CMP     Component Value Date/Time   NA 139 04/20/2021 0946   K 3.9 04/20/2021 0946   CL 106 04/20/2021 0946   CO2 28 04/20/2021 0946   GLUCOSE 109 (H) 04/20/2021 0946   BUN 20 04/20/2021 0946   CREATININE 1.03 (H) 04/20/2021 0946   CALCIUM 9.6 04/20/2021 0946   PROT 7.0 04/20/2021 0946   ALBUMIN 3.9 04/20/2021 0946   AST 17 04/20/2021 0946   ALT 11 04/20/2021 0946   ALKPHOS 90 04/20/2021 0946   BILITOT 0.9 04/20/2021 0946   GFRNONAA 55 (L) 04/20/2021 0946   GFRAA >60 08/22/2017 0601     VAS [place] ABI WITH/WO TBI  Result Date: 08/16/2021  LOWER EXTREMITY DOPPLER STUDY Patient Name:  LAROSE BATRES  Date of Exam:   08/14/2021 Medical Rec #: [id number]        Accession #:    [id number] Date of Birth: 01-Oct-1940        Patient Gender: F Patient Age:   44 years Exam Location:  [facility] Procedure:      VAS [place] ABI WITH/WO TBI Referring Phys: --------------------------------------------------------------------------------  Indications: Left hip pain.  Performing Technologist: Concha Norway RVT  Examination Guidelines: A complete evaluation includes at minimum, Doppler waveform signals and systolic blood pressure reading at the level of bilateral brachial, anterior tibial, and posterior tibial arteries, when vessel segments are accessible. Bilateral testing is considered an integral part of a complete examination. Photoelectric Plethysmograph (PPG) waveforms and toe systolic pressure readings are included as required and additional duplex testing as needed. Limited examinations for reoccurring indications may be performed as noted.  ABI Findings: +---------+------------------+-----+---------+--------+  Right     Rt Pressure (mmHg) Index Waveform  Comment    +---------+------------------+-----+---------+--------+  Brachial  138                                          +---------+------------------+-----+---------+--------+  ATA       142                1.03  triphasic           +---------+------------------+-----+---------+--------+  PTA       144                1.04  triphasic           +---------+------------------+-----+---------+--------+  Great Toe 133                0.96  Normal              +---------+------------------+-----+---------+--------+ +---------+------------------+-----+---------+-------+  Left      Lt Pressure (mmHg) Index Waveform  Comment  +---------+------------------+-----+---------+-------+  Brachial  138                                         +---------+------------------+-----+---------+-------+  ATA       145                1.05  triphasic          +---------+------------------+-----+---------+-------+  PTA       151                1.09  triphasic          +---------+------------------+-----+---------+-------+  Great Toe 145  1.05  Normal             +---------+------------------+-----+---------+-------+ +-------+-----------+-----------+------------+------------+  ABI/TBI Today's ABI Today's TBI Previous ABI Previous TBI  +-------+-----------+-----------+------------+------------+  Right   1.04        .96         1.30         1.19          +-------+-----------+-----------+------------+------------+  Left    1.09        1.05        1.33         1.24          +-------+-----------+-----------+------------+------------+ Bilateral ABIs and TBIs appear essentially unchanged compared to prior study on 2019.  Summary: Right: Resting right ankle-brachial index is within normal range. No evidence of significant right lower extremity arterial disease. The right toe-brachial index is normal. Left: Resting left ankle-brachial index is within normal range. No evidence of significant left lower extremity arterial disease. The left toe-brachial  index is normal.  *See table(s) above for measurements and observations.  Electronically signed by Hortencia Pilar MD on 08/16/2021 at 3:47:40 PM.    Final        Assessment & Plan:   1. Bilateral carotid artery stenosis Recommend:  Given the patient's asymptomatic subcritical stenosis no further invasive testing or surgery at this time.  Duplex ultrasound shows 40 to 59% stenosis in the right ICA 1 to 39% in the left  Continue antiplatelet therapy as prescribed Continue management of CAD, HTN and Hyperlipidemia Healthy heart diet,  encouraged exercise at least 4 times per week Follow up in 12 months with duplex ultrasound and physical exam    2. Pain in both lower extremities The patient's lower extremity pain is concerning for possible claudication given her history.  We will have her return in the next week or so for noninvasive studies to ensure that this is not vascular related.  The patient also does have some lower back issues.  They are not vascular related we will have her follow-up with PCP for further work-up and evaluation.  3. Type 2 diabetes, diet controlled ([place]) Continue hypoglycemic medications as already ordered, these medications have been reviewed and there are no changes at this time.  Hgb A1C to be monitored as already arranged by primary service    Current Outpatient Medications on File Prior to Visit  Medication Sig Dispense Refill   acetaminophen (TYLENOL) 325 MG tablet Take 1,000 mg by mouth 2 (two) times daily.      albuterol (PROVENTIL HFA;VENTOLIN HFA) 108 (90 Base) MCG/ACT inhaler Inhale 2 puffs into the lungs every 6 (six) hours as needed for wheezing or shortness of breath. 1 Inhaler 0   aspirin EC 81 MG tablet Take 81 mg by mouth daily.     atorvastatin (LIPITOR) 20 MG tablet Take 20 mg by mouth at bedtime.      atorvastatin (LIPITOR) 20 MG tablet Take 1 tablet by mouth daily.     azithromycin (ZITHROMAX) 250 MG tablet Take by mouth 3 (three) times a  week.     Blood Glucose Monitoring Suppl (FIFTY50 GLUCOSE METER 2.0) w/Device KIT Use as directed BAYER CONTOUR NEXT METER DX: E11.9     CONTOUR NEXT TEST test strip      ergocalciferol (VITAMIN D2) 1.25 MG (50000 UT) capsule Take 50,000 Units by mouth once a week.     Fluticasone-Umeclidin-Vilant (TRELEGY ELLIPTA) 100-62.5-25 MCG/INH AEPB Inhale into the lungs as needed.  furosemide (LASIX) 40 MG tablet Take one tablet by mouth once daily as needed for lower extremity edema     gabapentin (NEURONTIN) 300 MG capsule Take 300 mg by mouth at bedtime.     hydrocortisone (ANUSOL-HC) 2.5 % rectal cream Place 1 application rectally 2 (two) times daily.     magnesium oxide (MAG-OX) 400 MG tablet Take 800 mg by mouth daily.      nystatin cream (MYCOSTATIN) Apply 1 application topically 2 (two) times daily as needed. Apply thin film to yeast rash under the breasts     Omega-3 Krill Oil 500 MG CAPS Take 500 mg by mouth 3 (three) times a week. On Monday Wednesday and Friday     omeprazole (PRILOSEC) 20 MG capsule Take 20 mg by mouth daily. Acid reflux      potassium chloride (K-DUR,KLOR-CON) 10 MEQ tablet Take 10 mEq by mouth daily.     predniSONE (DELTASONE) 10 MG tablet Take 10 mg by mouth daily.     sennosides-docusate sodium (SENOKOT-S) 8.6-50 MG tablet Take 2 tablets by mouth 2 (two) times daily.      solifenacin (VESICARE) 5 MG tablet Take 5 mg by mouth daily.     TOPROL XL 25 MG 24 hr tablet Take 25 mg by mouth at bedtime.     traMADol (ULTRAM) 50 MG tablet Take 50 mg by mouth every 6 (six) hours as needed.     triamterene-hydrochlorothiazide (DYAZIDE) 37.5-25 MG capsule Take 1 capsule by mouth daily.     polyethylene glycol-electrolytes (NULYTELY) 420 g solution Take 4,000 mLs by mouth once.     WIXELA INHUB 250-50 MCG/ACT AEPB Inhale 1 puff into the lungs 2 (two) times daily.     No current facility-administered medications on file prior to visit.    There are no Patient Instructions on  file for this visit. No follow-ups on file.   Kris Hartmann, NP

## 2021-08-20 ENCOUNTER — Other Ambulatory Visit (HOSPITAL_COMMUNITY): Payer: Self-pay | Admitting: Internal Medicine

## 2021-08-20 ENCOUNTER — Other Ambulatory Visit: Payer: Self-pay | Admitting: Internal Medicine

## 2021-08-20 DIAGNOSIS — R221 Localized swelling, mass and lump, neck: Secondary | ICD-10-CM

## 2021-08-26 ENCOUNTER — Encounter (INDEPENDENT_AMBULATORY_CARE_PROVIDER_SITE_OTHER): Payer: Self-pay | Admitting: Nurse Practitioner

## 2021-08-26 NOTE — Progress Notes (Signed)
Subjective:    Patient ID: [name], female    DOB: Jan 21, 1941, 81 y.o.   MRN: [id number] Chief Complaint  Patient presents with   Follow-up    ABI art    The patient returns today for follow-up noninvasive studies following her recent follow-up for carotid artery stenosis.  Her carotid artery stenosis was stable however she was noting pain and weakness in her lower extremities.  She notes that the pain was worse at night and will wake her up during the evenings.  There was concern for possible circulation issues given her carotid disease.  Today noninvasive studies show an ABI of 1.04 on the right and 1.09 on the left.  The patient has triphasic tibial artery waveforms with good normal toe waveforms bilaterally.   Review of Systems  Cardiovascular:  Positive for leg swelling.  Neurological:  Positive for numbness.  All other systems reviewed and are negative.     Objective:   Physical Exam Vitals reviewed.  HENT:     Head: Normocephalic.  Cardiovascular:     Rate and Rhythm: Normal rate.  Pulmonary:     Effort: Pulmonary effort is normal.  Musculoskeletal:     Right lower leg: 1+ Edema present.     Left lower leg: 1+ Edema present.  Neurological:     Mental Status: She is alert and oriented to person, place, and time.  Psychiatric:        Mood and Affect: Mood normal.        Behavior: Behavior normal.        Thought Content: Thought content normal.        Judgment: Judgment normal.    BP (!) 146/80    Pulse 61    Ht _0  (1.803 m)    Wt 258 lb (117 kg)    BMI 35.98 kg/m   Past Medical History:  Diagnosis Date   Acquired hammertoe    other   Acquired hammertoe    Angina pectoris, unspecified (HCC)    Angina pectoris, unspecified ([place])    Arthritis    knees   Breast cancer ([place]) 1990   left breast   Cancer ([place]) 1990,s   left breast   CHF (congestive heart failure) (HCC)    Chronic kidney disease    stage 2   Degenerative joint disease     Dermatophytosis of nail    Diabetes mellitus type 2, uncomplicated (HCC)    diet controlled   Dyspnea    Essential hypertension, benign    Exostosis    unspecified site   Fracture of right ankle 2010   Followed by Dr. Vickki Muff   GERD (gastroesophageal reflux disease)    Hemorrhoids    Hypercholesterolemia    Hyperlipidemia, unspecified    Hypertension    Lymphedema of leg    left leg worse than right leg.   Obesity, unspecified    Osteoarthrosis involving lower leg    unspecified whether generalized or localized    Pre-diabetes    Pure hypercholesterolemia    Vitamin D deficiency, unspecified    Wears dentures    partial upper    Social History   Socioeconomic History   Marital status: Widowed    Spouse name: Not on file   Number of children: 2   Years of education: Not on file   Highest education level: Not on file  Occupational History   Occupation: Retired  Tobacco Use   Smoking status: Never  Smokeless tobacco: Never  Vaping Use   Vaping Use: Never used  Substance and Sexual Activity   Alcohol use: Yes    Comment: 1-2 drinks every 1-2 months   Drug use: No   Sexual activity: Not on file  Other Topics Concern   Not on file  Social History Narrative   Not on file   Social Determinants of Health   Financial Resource Strain: Not on file  Food Insecurity: Not on file  Transportation Needs: Not on file  Physical Activity: Not on file  Stress: Not on file  Social Connections: Not on file  Intimate Partner Violence: Not on file    Past Surgical History:  Procedure Laterality Date   BREAST LUMPECTOMY Left 1995   BREAST SURGERY Left 1990   lumpectomy   BUNIONECTOMY Bilateral 1990's   plus hammer toe repair on left foot.   CATARACT EXTRACTION W/PHACO Left 08/10/2018   Procedure: CATARACT EXTRACTION PHACO AND INTRAOCULAR LENS PLACEMENT ([place])  LEFT DIABETIC;  Surgeon: Eulogio Bear, MD;  Location: [place];  Service: Ophthalmology;   Laterality: Left;  diabetic - diet controlled   CATARACT EXTRACTION W/PHACO Right 11/16/2018   Procedure: CATARACT EXTRACTION PHACO AND INTRAOCULAR LENS PLACEMENT ([place])  RIGHT DIABETIC;  Surgeon: Eulogio Bear, MD;  Location: [place];  Service: Ophthalmology;  Laterality: Right;  Diabetic - diet controlled   COLONOSCOPY     COLONOSCOPY WITH PROPOFOL N/A 05/08/2018   Procedure: COLONOSCOPY WITH PROPOFOL;  Surgeon: Lollie Sails, MD;  Location: [facility] ENDOSCOPY;  Service: Endoscopy;  Laterality: N/A;   ENDOMETRIAL BIOPSY     FOOT SURGERY Left    for bunionectomy and hammertoe   JOINT REPLACEMENT     TONSILLECTOMY     TOTAL KNEE ARTHROPLASTY Left 04/08/2017   Procedure: TOTAL KNEE ARTHROPLASTY;  Surgeon: Corky Mull, MD;  Location: [facility];  Service: Orthopedics;  Laterality: Left;   TOTAL KNEE ARTHROPLASTY Right 08/19/2017   Procedure: TOTAL KNEE ARTHROPLASTY;  Surgeon: Corky Mull, MD;  Location: [facility];  Service: Orthopedics;  Laterality: Right;    Family History  Problem Relation Age of Onset   Stroke Mother    Hypertension Mother    Cancer Father    Prostate cancer Brother    Breast cancer Maternal Aunt 89   Breast cancer Cousin        mat cousin   Kidney cancer Neg Hx    Kidney disease Neg Hx     Allergies  Allergen Reactions   Colesevelam Other (See Comments)    constipation   Oxybutynin Swelling    Leg swelling   Rosuvastatin Other (See Comments)    Pain in lower extremities   Zoster Vaccine Recombinant, Adjuvanted Hives    CBC Latest Ref Rng & Units 04/20/2021 04/14/2020 06/11/2018  WBC 4.0 - 10.5 K/uL 5.8 5.2 4.9  Hemoglobin 12.0 - 15.0 g/dL 14.8 14.7 14.4  Hematocrit 36.0 - 46.0 % 44.1 44.5 44.9  Platelets 150 - 400 K/uL 172 164 177      CMP     Component Value Date/Time   NA 139 04/20/2021 0946   K 3.9 04/20/2021 0946   CL 106 04/20/2021 0946   CO2 28 04/20/2021 0946   GLUCOSE 109 (H) 04/20/2021 0946   BUN 20 04/20/2021 0946    CREATININE 1.03 (H) 04/20/2021 0946   CALCIUM 9.6 04/20/2021 0946   PROT 7.0 04/20/2021 0946   ALBUMIN 3.9 04/20/2021 0946   AST 17 04/20/2021  0946   ALT 11 04/20/2021 0946   ALKPHOS 90 04/20/2021 0946   BILITOT 0.9 04/20/2021 0946   GFRNONAA 55 (L) 04/20/2021 0946   GFRAA >60 08/22/2017 0601     VAS [place] ABI WITH/WO TBI  Result Date: 08/16/2021  LOWER EXTREMITY DOPPLER STUDY Patient Name:  AJIA CHADDERDON  Date of Exam:   08/14/2021 Medical Rec #: [id number]        Accession #:    [id number] Date of Birth: Dec 06, 1940        Patient Gender: F Patient Age:   68 years Exam Location:  [facility] Procedure:      VAS [place] ABI WITH/WO TBI Referring Phys: --------------------------------------------------------------------------------  Indications: Left hip pain.  Performing Technologist: Concha Norway RVT  Examination Guidelines: A complete evaluation includes at minimum, Doppler waveform signals and systolic blood pressure reading at the level of bilateral brachial, anterior tibial, and posterior tibial arteries, when vessel segments are accessible. Bilateral testing is considered an integral part of a complete examination. Photoelectric Plethysmograph (PPG) waveforms and toe systolic pressure readings are included as required and additional duplex testing as needed. Limited examinations for reoccurring indications may be performed as noted.  ABI Findings: +---------+------------------+-----+---------+--------+  Right     Rt Pressure (mmHg) Index Waveform  Comment   +---------+------------------+-----+---------+--------+  Brachial  138                                          +---------+------------------+-----+---------+--------+  ATA       142                1.03  triphasic           +---------+------------------+-----+---------+--------+  PTA       144                1.04  triphasic           +---------+------------------+-----+---------+--------+  Great Toe 133                0.96  Normal               +---------+------------------+-----+---------+--------+ +---------+------------------+-----+---------+-------+  Left      Lt Pressure (mmHg) Index Waveform  Comment  +---------+------------------+-----+---------+-------+  Brachial  138                                         +---------+------------------+-----+---------+-------+  ATA       145                1.05  triphasic          +---------+------------------+-----+---------+-------+  PTA       151                1.09  triphasic          +---------+------------------+-----+---------+-------+  Great Toe 145                1.05  Normal             +---------+------------------+-----+---------+-------+ +-------+-----------+-----------+------------+------------+  ABI/TBI Today's ABI Today's TBI Previous ABI Previous TBI  +-------+-----------+-----------+------------+------------+  Right   1.04        .96         1.30         1.19          +-------+-----------+-----------+------------+------------+  Left    1.09        1.05        1.33         1.24          +-------+-----------+-----------+------------+------------+ Bilateral ABIs and TBIs appear essentially unchanged compared to prior study on 2019.  Summary: Right: Resting right ankle-brachial index is within normal range. No evidence of significant right lower extremity arterial disease. The right toe-brachial index is normal. Left: Resting left ankle-brachial index is within normal range. No evidence of significant left lower extremity arterial disease. The left toe-brachial index is normal.  *See table(s) above for measurements and observations.  Electronically signed by Hortencia Pilar MD on 08/16/2021 at 3:47:40 PM.    Final        Assessment & Plan:   1. PVD (peripheral vascular disease) ([place]) Today noninvasive studies show no evidence of significant arterial disease that would account for the patient's pain symptoms.  Patient is advised to discuss with primary care physician.  The symptoms are  somewhat concerning for possible neuropathic like pain. - VAS [place] LOWER EXTREMITY ARTERIAL DUPLEX  2. Bilateral carotid artery stenosis Stable at last appointment.  We will have patient continue to follow-up annually.  3. Type 2 diabetes, diet controlled ([place]) Continue hypoglycemic medications as already ordered, these medications have been reviewed and there are no changes at this time.  Hgb A1C to be monitored as already arranged by primary service   4. Essential hypertension Continue antihypertensive medications as already ordered, these medications have been reviewed and there are no changes at this time.    Current Outpatient Medications on File Prior to Visit  Medication Sig Dispense Refill   acetaminophen (TYLENOL) 325 MG tablet Take 1,000 mg by mouth 2 (two) times daily.      albuterol (PROVENTIL HFA;VENTOLIN HFA) 108 (90 Base) MCG/ACT inhaler Inhale 2 puffs into the lungs every 6 (six) hours as needed for wheezing or shortness of breath. 1 Inhaler 0   aspirin EC 81 MG tablet Take 81 mg by mouth daily.     atorvastatin (LIPITOR) 20 MG tablet Take 20 mg by mouth at bedtime.      atorvastatin (LIPITOR) 20 MG tablet Take 1 tablet by mouth daily.     azithromycin (ZITHROMAX) 250 MG tablet Take by mouth 3 (three) times a week.     Blood Glucose Monitoring Suppl (FIFTY50 GLUCOSE METER 2.0) w/Device KIT Use as directed BAYER CONTOUR NEXT METER DX: E11.9     CONTOUR NEXT TEST test strip      ergocalciferol (VITAMIN D2) 1.25 MG (50000 UT) capsule Take 50,000 Units by mouth once a week.     Fluticasone-Umeclidin-Vilant (TRELEGY ELLIPTA) 100-62.5-25 MCG/INH AEPB Inhale into the lungs as needed.     furosemide (LASIX) 40 MG tablet Take one tablet by mouth once daily as needed for lower extremity edema     gabapentin (NEURONTIN) 300 MG capsule Take 300 mg by mouth at bedtime.     hydrocortisone (ANUSOL-HC) 2.5 % rectal cream Place 1 application rectally 2 (two) times daily.     magnesium  oxide (MAG-OX) 400 MG tablet Take 800 mg by mouth daily.      nystatin cream (MYCOSTATIN) Apply 1 application topically 2 (two) times daily as needed. Apply thin film to yeast rash under the breasts     Omega-3 Krill Oil 500 MG CAPS Take 500 mg by mouth 3 (three) times a week. On Monday Wednesday and Friday  omeprazole (PRILOSEC) 20 MG capsule Take 20 mg by mouth daily. Acid reflux      polyethylene glycol-electrolytes (NULYTELY) 420 g solution Take 4,000 mLs by mouth once.     potassium chloride (K-DUR,KLOR-CON) 10 MEQ tablet Take 10 mEq by mouth daily.     predniSONE (DELTASONE) 10 MG tablet Take 10 mg by mouth daily.     sennosides-docusate sodium (SENOKOT-S) 8.6-50 MG tablet Take 2 tablets by mouth 2 (two) times daily.      solifenacin (VESICARE) 5 MG tablet Take 5 mg by mouth daily.     TOPROL XL 25 MG 24 hr tablet Take 25 mg by mouth at bedtime.     traMADol (ULTRAM) 50 MG tablet Take 50 mg by mouth every 6 (six) hours as needed.     triamterene-hydrochlorothiazide (DYAZIDE) 37.5-25 MG capsule Take 1 capsule by mouth daily.     WIXELA INHUB 250-50 MCG/ACT AEPB Inhale 1 puff into the lungs 2 (two) times daily.     No current facility-administered medications on file prior to visit.    There are no Patient Instructions on file for this visit. No follow-ups on file.   Kris Hartmann, NP

## 2021-09-05 ENCOUNTER — Ambulatory Visit
Admission: RE | Admit: 2021-09-05 | Discharge: 2021-09-05 | Disposition: A | Discharge status: Home / Self Care | Payer: Medicare Other | Source: Ambulatory Visit | Attending: Internal Medicine | Admitting: Internal Medicine

## 2021-09-05 ENCOUNTER — Other Ambulatory Visit: Payer: Self-pay

## 2021-09-05 DIAGNOSIS — Z1231 Encounter for screening mammogram for malignant neoplasm of breast: Secondary | ICD-10-CM | POA: Diagnosis not present

## 2021-11-01 ENCOUNTER — Ambulatory Visit
Admission: RE | Admit: 2021-11-01 | Discharge: 2021-11-01 | Disposition: A | Discharge status: Home / Self Care | Payer: Medicare Other | Attending: Gastroenterology | Admitting: Gastroenterology

## 2021-11-01 ENCOUNTER — Ambulatory Visit: Payer: Medicare Other | Admitting: Anesthesiology

## 2021-11-01 ENCOUNTER — Encounter: Payer: Self-pay | Admitting: Gastroenterology

## 2021-11-01 ENCOUNTER — Encounter
Admission: RE | Disposition: A | Discharge status: Home / Self Care | Payer: Self-pay | Source: Home / Self Care | Attending: Gastroenterology

## 2021-11-01 DIAGNOSIS — K621 Rectal polyp: Secondary | ICD-10-CM | POA: Insufficient documentation

## 2021-11-01 DIAGNOSIS — K219 Gastro-esophageal reflux disease without esophagitis: Secondary | ICD-10-CM | POA: Diagnosis not present

## 2021-11-01 DIAGNOSIS — Z8371 Family history of colonic polyps: Secondary | ICD-10-CM | POA: Insufficient documentation

## 2021-11-01 DIAGNOSIS — I130 Hypertensive heart and chronic kidney disease with heart failure and stage 1 through stage 4 chronic kidney disease, or unspecified chronic kidney disease: Secondary | ICD-10-CM | POA: Insufficient documentation

## 2021-11-01 DIAGNOSIS — Z79899 Other long term (current) drug therapy: Secondary | ICD-10-CM | POA: Insufficient documentation

## 2021-11-01 DIAGNOSIS — K640 First degree hemorrhoids: Secondary | ICD-10-CM | POA: Insufficient documentation

## 2021-11-01 DIAGNOSIS — D123 Benign neoplasm of transverse colon: Secondary | ICD-10-CM | POA: Diagnosis not present

## 2021-11-01 DIAGNOSIS — N182 Chronic kidney disease, stage 2 (mild): Secondary | ICD-10-CM | POA: Insufficient documentation

## 2021-11-01 DIAGNOSIS — D120 Benign neoplasm of cecum: Secondary | ICD-10-CM | POA: Insufficient documentation

## 2021-11-01 DIAGNOSIS — K921 Melena: Secondary | ICD-10-CM | POA: Diagnosis present

## 2021-11-01 DIAGNOSIS — E1122 Type 2 diabetes mellitus with diabetic chronic kidney disease: Secondary | ICD-10-CM | POA: Diagnosis not present

## 2021-11-01 DIAGNOSIS — K5909 Other constipation: Secondary | ICD-10-CM | POA: Diagnosis not present

## 2021-11-01 DIAGNOSIS — D124 Benign neoplasm of descending colon: Secondary | ICD-10-CM | POA: Insufficient documentation

## 2021-11-01 DIAGNOSIS — I5032 Chronic diastolic (congestive) heart failure: Secondary | ICD-10-CM | POA: Diagnosis not present

## 2021-11-01 DIAGNOSIS — D122 Benign neoplasm of ascending colon: Secondary | ICD-10-CM | POA: Insufficient documentation

## 2021-11-01 HISTORY — PX: COLONOSCOPY: SHX5424

## 2021-11-01 SURGERY — COLONOSCOPY
Anesthesia: General

## 2021-11-01 MED ORDER — PROPOFOL 500 MG/50ML IV EMUL
INTRAVENOUS | Status: DC | PRN
  Administered 2021-11-01: 140 ug/kg/min via INTRAVENOUS

## 2021-11-01 MED ORDER — LIDOCAINE HCL (CARDIAC) PF 100 MG/5ML IV SOSY
PREFILLED_SYRINGE | INTRAVENOUS | Status: DC | PRN
  Administered 2021-11-01: 80 mg via INTRAVENOUS

## 2021-11-01 MED ORDER — SODIUM CHLORIDE 0.9 % IV SOLN: INTRAVENOUS | Status: DC

## 2021-11-01 MED ORDER — PROPOFOL 10 MG/ML IV BOLUS
INTRAVENOUS | Status: DC | PRN
  Administered 2021-11-01: 60 mg via INTRAVENOUS

## 2021-11-01 NOTE — Anesthesia Preprocedure Evaluation (Addendum)
Anesthesia Evaluation  ?Patient identified by MRN, date of birth, ID band ?Patient awake ? ? ? ?Reviewed: ?Allergy & Precautions, NPO status , Patient's Chart, lab work & pertinent test results ? ?History of Anesthesia Complications ?Negative for: history of anesthetic complications ? ?Airway ?Mallampati: III ? ? ?Neck ROM: Full ? ? ? Dental ? ?(+) Partial Upper ?  ?Pulmonary ?neg pulmonary ROS,  ?  ?Pulmonary exam normal ?breath sounds clear to auscultation ? ? ? ? ? ? Cardiovascular ?hypertension, +CHF (diastolic)  ?Normal cardiovascular exam ?Rhythm:Regular Rate:Normal ? ?Echo 12/02/16: normal left ventricular function, with LVEF greater than 55%, with mild tricuspid regurgitation ?  ?Neuro/Psych ?negative neurological ROS ?   ? GI/Hepatic ?GERD  ,  ?Endo/Other  ?diabetes, Type 2 ? Renal/GU ?Renal disease (stage II CKD)  ? ?  ?Musculoskeletal ? ?(+) Arthritis ,  ? Abdominal ?  ?Peds ? Hematology ?Breast CA   ?Anesthesia Other Findings ?Cardiology note 07/20/21:  ?81 year old female with chronic peripheral edema, modestly improved on diuretic therapy, normal left ventricular function by 2D echocardiogram. The patient has essential hypertension, blood pressure well controlled on current BP medications. Patient noted to have ectopy on physical examination, ECG revealed sinus rhythm with PACs. The patient has had recent bronchitis treated with antibiotics and prednisone therapy followed by Dr. Lanney Gins. ? ?Plan  ? ?1. Continue current medications ?2. Counseled patient about low-sodium diet ?3. DASH diet printed instructions given to the patient ?4. Counseled patient about low-cholesterol diet ?5. Continue atorvastatin for hyperlipidemia management  ?6. Low-fat and cholesterol diet printed instructions given to patient ?7. Instructed patient to keep her feet elevated when sitting ?8. Return to clinic for follow-up in 4 months ? ?No orders of the defined types were placed in this  encounter. ? ?Return in about 4 months (around 11/17/2021). ? ? Reproductive/Obstetrics ? ?  ? ? ? ? ? ? ? ? ? ? ? ? ? ?  ?  ? ? ? ? ? ? ? ?Anesthesia Physical ?Anesthesia Plan ? ?ASA: 2 ? ?Anesthesia Plan: General  ? ?Post-op Pain Management:   ? ?Induction: Intravenous ? ?PONV Risk Score and Plan: 3 and Propofol infusion, TIVA and Treatment may vary due to age or medical condition ? ?Airway Management Planned: Natural Airway ? ?Additional Equipment:  ? ?Intra-op Plan:  ? ?Post-operative Plan:  ? ?Informed Consent: I have reviewed the patients History and Physical, chart, labs and discussed the procedure including the risks, benefits and alternatives for the proposed anesthesia with the patient or authorized representative who has indicated his/her understanding and acceptance.  ? ? ? ? ? ?Plan Discussed with: CRNA ? ?Anesthesia Plan Comments: (LMA/GETA backup discussed.  Patient consented for risks of anesthesia including but not limited to:  ?- adverse reactions to medications ?- damage to eyes, teeth, lips or other oral mucosa ?- nerve damage due to positioning  ?- sore throat or hoarseness ?- damage to heart, brain, nerves, lungs, other parts of body or loss of life ? ?Informed patient about role of CRNA in peri- and intra-operative care.  Patient voiced understanding.)  ? ? ? ? ? ? ?Anesthesia Quick Evaluation ? ?

## 2021-11-01 NOTE — Op Note (Signed)
[facility] ?Gastroenterology ?Patient Name: [name] ?Procedure Date: 11/01/2021 11:00 AM ?MRN: [id number] ?Account #: [id number] ?Date of Birth: [birth year] ?Admit Type: Outpatient ?Age: 81 ?Room: [facility] ENDO ROOM 1 ?Gender: Female ?Note Status: Finalized ?Instrument Name: Colonoscope [id number] ?Procedure:             Colonoscopy ?Indications:           Hematochezia ?Providers:             Annamaria Helling DO, DO ?Referring MD:          [facility] ?Medicines:             Monitored Anesthesia Care ?Complications:         No immediate complications. Estimated blood loss:  ?                       Minimal. ?Procedure:             Pre-Anesthesia Assessment: ?                       - Prior to the procedure, a History and Physical was  ?                       performed, and patient medications and allergies were  ?                       reviewed. The patient is competent. The risks and  ?                       benefits of the procedure and the sedation options and  ?                       risks were discussed with the patient. All questions  ?                       were answered and informed consent was obtained.  ?                       Patient identification and proposed procedure were  ?                       verified by the physician, the nurse, the anesthetist  ?                       and the technician in the endoscopy suite. Mental  ?                       Status Examination: alert and oriented. Airway  ?                       Examination: normal oropharyngeal airway and neck  ?                       mobility. Respiratory Examination: clear to  ?                       auscultation. CV Examination: RRR, no murmurs, no S3  ?  or S4. Prophylactic Antibiotics: The patient does not  ?                       require prophylactic antibiotics. Prior  ?                       Anticoagulants: The patient has taken no previous  ?                       anticoagulant or antiplatelet  agents. ASA Grade  ?                       Assessment: II - A patient with mild systemic disease.  ?                       After reviewing the risks and benefits, the patient  ?                       was deemed in satisfactory condition to undergo the  ?                       procedure. The anesthesia plan was to use monitored  ?                       anesthesia care (MAC). Immediately prior to  ?                       administration of medications, the patient was  ?                       re-assessed for adequacy to receive sedatives. The  ?                       heart rate, respiratory rate, oxygen saturations,  ?                       blood pressure, adequacy of pulmonary ventilation, and  ?                       response to care were monitored throughout the  ?                       procedure. The physical status of the patient was  ?                       re-assessed after the procedure. ?                       After obtaining informed consent, the colonoscope was  ?                       passed under direct vision. Throughout the procedure,  ?                       the patient's blood pressure, pulse, and oxygen  ?                       saturations were monitored continuously. The  ?  Colonoscope was introduced through the anus and  ?                       advanced to the the cecum, identified by appendiceal  ?                       orifice and ileocecal valve. The colonoscopy was  ?                       performed without difficulty. The patient tolerated  ?                       the procedure well. The quality of the bowel  ?                       preparation was evaluated using the BBPS [facility] Bowel  ?                       Preparation Scale) with scores of: Right Colon = 2  ?                       (minor amount of residual staining, small fragments of  ?                       stool and/or opaque liquid, but mucosa seen well),  ?                       Transverse Colon = 3 (entire  mucosa seen well with no  ?                       residual staining, small fragments of stool or opaque  ?                       liquid) and Left Colon = 2 (minor amount of residual  ?                       staining, small fragments of stool and/or opaque  ?                       liquid, but mucosa seen well). The total BBPS score  ?                       equals 7. The quality of the bowel preparation was  ?                       good. The ileocecal valve, appendiceal orifice, and  ?                       rectum were photographed. ?Findings: ?     The perianal and digital rectal examinations were normal. Pertinent  ?     negatives include normal sphincter tone. ?     Non-bleeding internal hemorrhoids were found during retroflexion. The  ?     hemorrhoids were Grade I (internal hemorrhoids that do not prolapse).  ?     Estimated blood loss: none. ?     12 sessile polyps were found in the rectum, descending colon, transverse  ?  colon, ascending colon and cecum. The polyps were 2 to 6 mm in size.  ?     These polyps were removed with a cold snare. Resection and retrieval  ?     were complete. Estimated blood loss was minimal. To prevent bleeding  ?     after the polypectomy, one hemostatic clip was successfully placed (MR  ?     conditional). There was no bleeding at the end of the procedure. Clip  ?     placed on larger ascending colon polyp. ?     The exam was otherwise without abnormality on direct and retroflexion  ?     views. ?Impression:            - Non-bleeding internal hemorrhoids. ?                       - 12 2 to 6 mm polyps in the rectum, in the descending  ?                       colon, in the transverse colon, in the ascending colon  ?                       and in the cecum, removed with a cold snare. Resected  ?                       and retrieved. Clip (MR conditional) was placed. ?                       - The examination was otherwise normal on direct and  ?                       retroflexion  views. ?Recommendation:        - Discharge patient to home. ?                       - Resume previous diet. ?                       - Continue present medications. ?                       - No aspirin, ibuprofen, naproxen, or other  ?                       non-steroidal anti-inflammatory drugs for 5 days after  ?                       polyp removal. ?                       - Hold meloxicam/mobic for 5 days. ?                       - Await pathology results. ?                       - Repeat colonoscopy in 1 year for surveillance of  ?                       multiple polyps. ?                       -  Return to GI office as previously scheduled. ?                       - The findings and recommendations were discussed with  ?                       the patient. ?Procedure Code(s):     --- Professional --- ?                       [phone], Colonoscopy, flexible; with removal of  ?                       tumor(s), polyp(s), or other lesion(s) by snare  ?                       technique ?Diagnosis Code(s):     --- Professional --- ?                       K62.1, Rectal polyp ?                       K63.5, Polyp of colon ?                       K64.0, First degree hemorrhoids ?                       K92.1, Melena (includes Hematochezia) ?CPT copyright 2019 [employer]. All rights reserved. ?The codes documented in this report are preliminary and upon coder review may  ?be revised to meet current compliance requirements. ?Attending Participation: ?     I personally performed the entire procedure. ?Volney American, DO ?[place], DO ?11/01/2021 12:02:04 PM ?This report has been signed electronically. ?Number of Addenda: 0 ?Note Initiated On: 11/01/2021 11:00 AM ?Scope Withdrawal Time: 0 hours 32 minutes 37 seconds  ?Total Procedure Duration: 0 hours 37 minutes 16 seconds  ?Estimated Blood Loss:  Estimated blood loss was minimal. ?     [facility] ?

## 2021-11-01 NOTE — Transfer of Care (Signed)
Immediate Anesthesia Transfer of Care Note ? ?Patient: [name] ? ?Procedure(s) Performed: COLONOSCOPY ? ?Patient Location: PACU ? ?Anesthesia Type:General ? ?Level of Consciousness: awake, alert  and oriented ? ?Airway & Oxygen Therapy: Patient Spontanous Breathing ? ?Post-op Assessment: Report given to RN and Post -op Vital signs reviewed and stable ? ?Post vital signs: Reviewed and stable ? ?Last Vitals:  ?Vitals Value Taken Time  ?BP    ?Temp 35.5 ?C 11/01/21 1201  ?Pulse 63 11/01/21 1201  ?Resp 17 11/01/21 1201  ?SpO2 99 % 11/01/21 1201  ? ? ?Last Pain:  ?Vitals:  ? 11/01/21 1201  ?TempSrc: Temporal  ?PainSc: 0-No pain  ?   ? ?  ? ?Complications: No notable events documented. ?

## 2021-11-01 NOTE — H&P (Signed)
? ?Pre-Procedure H&P ?  ?Patient ID: [name] is a 81 y.o. female. ? ?Gastroenterology Provider: Annamaria Helling, DO ? ?Referring Provider: Dawson Bills, NP ?PCP: [place] ? ?Date: 11/01/2021 ? ?HPI ?[name] is a 81 y.o. female who presents today for Colonoscopy for Bright red blood per rectum. ?Patient with recurrent episodes of bright red blood per rectum on the tissue paper and in the toilet bowl for several weeks.  Bowel movements are otherwise normal without diarrhea constipation melena, pain with defecation.  No changes in appetite or weight.  Deals with chronic constipation requiring senna daily.  She does take Mobic on a regular basis. Pt reports that she has not noted any further blood in her stool as of late. ?Rare RLQ and LLQ abdominal discomfort, fleeting. ? ?Underwent colonoscopy in 2019 demonstrating left-sided diverticulosis and internal hemorrhoids.  She also had 5 tubular adenomas without high-grade dysplasia at that point that were removed.  Also underwent colonoscopy in 2008 and 2013 only demonstrating internal hemorrhoids. ? ?Paternal grandfather with history of colon polyps ? ?Most recent labs A1c 6.3 creatinine 0.9 hemoglobin stable at 13.9 MCV 93 platelets 221,000 ? ?No previous history of EGD ? ?Past Medical History:  ?Diagnosis Date  ? Acquired hammertoe   ? other  ? Acquired hammertoe   ? Angina pectoris, unspecified ([place])   ? Angina pectoris, unspecified ([place])   ? Arthritis   ? knees  ? Breast cancer ([place]) 1990  ? left breast  ? Cancer ([place]) 1990,s  ? left breast  ? CHF (congestive heart failure) ([place])   ? 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 Degenerative joint disease   ? Dermatophytosis of nail   ? Diabetes mellitus type 2, uncomplicated ([place])   ? diet controlled  ? Dyspnea   ? Essential hypertension, benign   ? Exostosis   ? unspecified site  ? Fracture of right ankle 2010  ? Followed by Dr. Vickki Muff  ? GERD (gastroesophageal reflux disease)   ?  Hemorrhoids   ? Hypercholesterolemia   ? Hyperlipidemia, unspecified   ? Hypertension   ? Lymphedema of leg   ? left leg worse than right leg.  ? Obesity, unspecified   ? Osteoarthrosis involving lower leg   ? unspecified whether generalized or localized   ? Pre-diabetes   ? Pure hypercholesterolemia   ? Vitamin D deficiency, unspecified   ? Wears dentures   ? partial upper  ? ? ?Past Surgical History:  ?Procedure Laterality Date  ? BREAST LUMPECTOMY Left 1995  ? BREAST SURGERY Left 1990  ? lumpectomy  ? BUNIONECTOMY Bilateral 1990's  ? plus hammer toe repair on left foot.  ? CATARACT EXTRACTION W/PHACO Left 08/10/2018  ? Procedure: CATARACT EXTRACTION PHACO AND INTRAOCULAR LENS PLACEMENT ([place])  LEFT DIABETIC;  Surgeon: Eulogio Bear, MD;  Location: [facility];  Service: Ophthalmology;  Laterality: Left;  diabetic - diet controlled  ? CATARACT EXTRACTION W/PHACO Right 11/16/2018  ? Procedure: CATARACT EXTRACTION PHACO AND INTRAOCULAR LENS PLACEMENT ([place])  RIGHT DIABETIC;  Surgeon: Eulogio Bear, MD;  Location: [facility] [facility];  Service: Ophthalmology;  Laterality: Right;  Diabetic - diet controlled  ? COLONOSCOPY    ? COLONOSCOPY WITH PROPOFOL N/A 05/08/2018  ? Procedure: COLONOSCOPY WITH PROPOFOL;  Surgeon: Lollie Sails, MD;  Location: [facility] ENDOSCOPY;  Service: Endoscopy;  Laterality: N/A;  ? ENDOMETRIAL BIOPSY    ? EYE SURGERY    ? FOOT  SURGERY Left   ? for bunionectomy and hammertoe  ? JOINT REPLACEMENT    ? TONSILLECTOMY    ? TOTAL KNEE ARTHROPLASTY Left 04/08/2017  ? Procedure: TOTAL KNEE ARTHROPLASTY;  Surgeon: Corky Mull, MD;  Location: [facility] OR[facility];  Service: Orthopedics;  Laterality: Left;  ? TOTAL KNEE ARTHROPLASTY Right 08/19/2017  ? Procedure: TOTAL KNEE ARTHROPLASTY;  Surgeon: Corky Mull, MD;  Location: [facility] OR[facility];  Service: Orthopedics;  Laterality: Right;  ? ? ?Family History ?paternal grandfather with history of colon polyps ?No other h/o GI disease or  malignancy ? ?Review of Systems  ?Constitutional:  Negative for activity change, appetite change, chills, diaphoresis, fatigue, fever and unexpected weight change.  ?HENT:  Negative for trouble swallowing and voice change.   ?Respiratory:  Negative for shortness of breath and wheezing.   ?Cardiovascular:  Negative for chest pain, palpitations and leg swelling.  ?Gastrointestinal:  Positive for anal bleeding and constipation. Negative for abdominal distention, abdominal pain, blood in stool, diarrhea, nausea, rectal pain and vomiting.  ?Musculoskeletal:  Negative for arthralgias and myalgias.  ?Skin:  Negative for color change and pallor.  ?Neurological:  Negative for dizziness, syncope and weakness.  ?Psychiatric/Behavioral:  Negative for confusion.   ?All other systems reviewed and are negative.  ? ?Medications ?No current facility-administered medications on file prior to encounter.  ? ?Current Outpatient Medications on File Prior to Encounter  ?Medication Sig Dispense Refill  ? acetaminophen (TYLENOL) 325 MG tablet Take 1,000 mg by mouth 2 (two) times daily.     ? aspirin EC 81 MG tablet Take 81 mg by mouth daily.    ? atorvastatin (LIPITOR) 20 MG tablet Take 20 mg by mouth at bedtime.     ? azithromycin (ZITHROMAX) 250 MG tablet Take by mouth 3 (three) times a week.    ? Blood Glucose Monitoring Suppl (FIFTY50 GLUCOSE METER 2.0) w/Device KIT Use as directed BAYER CONTOUR NEXT METER DX: E11.9    ? CONTOUR NEXT TEST test strip     ? ergocalciferol (VITAMIN D2) 1.25 MG (50000 UT) capsule Take 50,000 Units by mouth once a week.    ? Fluticasone-Umeclidin-Vilant (TRELEGY ELLIPTA) 100-62.5-25 MCG/INH AEPB Inhale into the lungs as needed.    ? furosemide (LASIX) 40 MG tablet Take one tablet by mouth once daily as needed for lower extremity edema    ? gabapentin (NEURONTIN) 300 MG capsule Take 300 mg by mouth at bedtime.    ? hydrocortisone (ANUSOL-HC) 2.5 % rectal cream Place 1 application rectally 2 (two) times daily.     ? magnesium oxide (MAG-OX) 400 MG tablet Take 800 mg by mouth daily.     ? nystatin cream (MYCOSTATIN) Apply 1 application topically 2 (two) times daily as needed. Apply thin film to yeast rash under the breasts    ? Omega-3 Krill Oil 500 MG CAPS Take 500 mg by mouth 3 (three) times a week. On Monday Wednesday and Friday    ? omeprazole (PRILOSEC) 20 MG capsule Take 20 mg by mouth daily. Acid reflux     ? potassium chloride (K-DUR,KLOR-CON) 10 MEQ tablet Take 10 mEq by mouth daily.    ? sennosides-docusate sodium (SENOKOT-S) 8.6-50 MG tablet Take 2 tablets by mouth 2 (two) times daily.     ? solifenacin (VESICARE) 5 MG tablet Take 5 mg by mouth daily.    ? TOPROL XL 25 MG 24 hr tablet Take 25 mg by mouth at bedtime.    ? traMADol (ULTRAM) 50 MG tablet  Take 50 mg by mouth every 6 (six) hours as needed.    ? triamterene-hydrochlorothiazide (DYAZIDE) 37.5-25 MG capsule Take 1 capsule by mouth daily.    ? albuterol (PROVENTIL HFA;VENTOLIN HFA) 108 (90 Base) MCG/ACT inhaler Inhale 2 puffs into the lungs every 6 (six) hours as needed for wheezing or shortness of breath. 1 Inhaler 0  ? ? ?Pertinent medications related to GI and procedure were reviewed by me with the patient prior to the procedure ? ? ?Current Facility-Administered Medications:  ?  0.9 %  sodium chloride infusion, , Intravenous, Continuous, Annamaria Helling, DO, Last Rate: 20 mL/hr at 11/01/21 1103, Continued from Pre-op at 11/01/21 1103 ?  ?  ? ?Allergies  ?Allergen Reactions  ? Colesevelam Other (See Comments)  ?  constipation  ? Oxybutynin Swelling  ?  Leg swelling  ? Rosuvastatin Other (See Comments)  ?  Pain in lower extremities  ? Zoster Vaccine Recombinant, Adjuvanted Hives  ? ?Allergies were reviewed by me prior to the procedure ? ?Objective  ? ?Body mass index is 33.96 kg/m?. ?Vitals:  ? 11/01/21 0948  ?BP: (!) 155/73  ?Pulse: 64  ?Resp: 18  ?Temp: (!) 96.8 ?F (36 ?C)  ?TempSrc: Temporal  ?SpO2: 100%  ?Weight: 110.5 kg  ?Height: _0   (1.803 m)  ? ? ? ?Physical Exam ?Vitals and nursing note reviewed.  ?Constitutional:   ?   General: She is not in acute distress. ?   Appearance: Normal appearance. She is not ill-appearing, toxic-appearing or diaphoretic.  ?HENT:

## 2021-11-01 NOTE — Anesthesia Postprocedure Evaluation (Signed)
Anesthesia Post Note ? ?Patient: [name] ? ?Procedure(s) Performed: COLONOSCOPY ? ?Patient location during evaluation: PACU ?Anesthesia Type: General ?Level of consciousness: awake and alert, oriented and patient cooperative ?Pain management: pain level controlled ?Vital Signs Assessment: post-procedure vital signs reviewed and stable ?Respiratory status: spontaneous breathing, nonlabored ventilation and respiratory function stable ?Cardiovascular status: blood pressure returned to baseline and stable ?Postop Assessment: adequate PO intake ?Anesthetic complications: no ? ? ?No notable events documented. ? ? ?Last Vitals:  ?Vitals:  ? 11/01/21 1211 11/01/21 1221  ?BP: (!) 111/91 134/65  ?Pulse: (!) 51 68  ?Resp: 12 12  ?Temp:    ?SpO2: 98% 100%  ?  ?Last Pain:  ?Vitals:  ? 11/01/21 1221  ?TempSrc:   ?PainSc: 0-No pain  ? ? ?  ?  ?  ?  ?  ?  ? ?Darrin Nipper ? ? ? ? ?

## 2021-11-01 NOTE — Interval H&P Note (Signed)
History and Physical Interval Note: Preprocedure H&P from 11/01/21 ? was reviewed and there was no interval change after seeing and examining the patient.  Written consent was obtained from the patient after discussion of risks, benefits, and alternatives. Patient has consented to proceed with Colonoscopy with possible intervention ? ? ?11/01/2021 ?11:05 AM ? 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  The various methods of treatment have been discussed with the patient and family. After consideration of risks, benefits and other options for treatment, the patient has consented to  Procedure(s) with comments: ?COLONOSCOPY (N/A) - DIET CONTROLLED as a surgical intervention.  The patient's history has been reviewed, patient examined, no change in status, stable for surgery.  I have reviewed the patient's chart and labs.  Questions were answered to the patient's satisfaction.   ? ? ?Annamaria Helling ? ? ?

## 2021-11-02 ENCOUNTER — Encounter: Payer: Self-pay | Admitting: Gastroenterology

## 2021-11-02 LAB — SURGICAL PATHOLOGY

## 2022-02-08 ENCOUNTER — Other Ambulatory Visit (INDEPENDENT_AMBULATORY_CARE_PROVIDER_SITE_OTHER): Payer: Self-pay | Admitting: Nurse Practitioner

## 2022-02-08 DIAGNOSIS — I6523 Occlusion and stenosis of bilateral carotid arteries: Secondary | ICD-10-CM

## 2022-02-11 ENCOUNTER — Encounter (INDEPENDENT_AMBULATORY_CARE_PROVIDER_SITE_OTHER): Payer: Medicare Other

## 2022-02-11 ENCOUNTER — Ambulatory Visit (INDEPENDENT_AMBULATORY_CARE_PROVIDER_SITE_OTHER): Payer: TRICARE For Life (TFL) | Admitting: Nurse Practitioner

## 2022-04-19 ENCOUNTER — Ambulatory Visit
Admission: RE | Admit: 2022-04-19 | Discharge: 2022-04-19 | Disposition: A | Discharge status: Home / Self Care | Payer: Medicare Other | Source: Ambulatory Visit | Attending: Oncology | Admitting: Oncology

## 2022-04-19 DIAGNOSIS — K862 Cyst of pancreas: Secondary | ICD-10-CM | POA: Insufficient documentation

## 2022-04-19 MED ORDER — GADOBUTROL 1 MMOL/ML IV SOLN
10.0000 mL | Freq: Once | INTRAVENOUS | Status: AC | PRN
  Administered 2022-04-19: 10 mL via INTRAVENOUS

## 2022-04-23 ENCOUNTER — Encounter: Payer: Self-pay | Admitting: Oncology

## 2022-04-23 ENCOUNTER — Inpatient Hospital Stay (HOSPITAL_BASED_OUTPATIENT_CLINIC_OR_DEPARTMENT_OTHER): Payer: Medicare Other | Admitting: Oncology

## 2022-04-23 ENCOUNTER — Inpatient Hospital Stay: Payer: Medicare Other | Attending: Oncology

## 2022-04-23 VITALS — BP 137/74 | HR 55 | Temp 97.8°F | Resp 18 | Wt 252.4 lb

## 2022-04-23 DIAGNOSIS — Z853 Personal history of malignant neoplasm of breast: Secondary | ICD-10-CM | POA: Diagnosis not present

## 2022-04-23 DIAGNOSIS — K862 Cyst of pancreas: Secondary | ICD-10-CM

## 2022-04-23 DIAGNOSIS — R978 Other abnormal tumor markers: Secondary | ICD-10-CM

## 2022-04-23 DIAGNOSIS — Z96653 Presence of artificial knee joint, bilateral: Secondary | ICD-10-CM | POA: Insufficient documentation

## 2022-04-23 DIAGNOSIS — I130 Hypertensive heart and chronic kidney disease with heart failure and stage 1 through stage 4 chronic kidney disease, or unspecified chronic kidney disease: Secondary | ICD-10-CM | POA: Insufficient documentation

## 2022-04-23 DIAGNOSIS — E1122 Type 2 diabetes mellitus with diabetic chronic kidney disease: Secondary | ICD-10-CM | POA: Diagnosis not present

## 2022-04-23 DIAGNOSIS — Z803 Family history of malignant neoplasm of breast: Secondary | ICD-10-CM | POA: Insufficient documentation

## 2022-04-23 DIAGNOSIS — R7989 Other specified abnormal findings of blood chemistry: Secondary | ICD-10-CM

## 2022-04-23 DIAGNOSIS — D378 Neoplasm of uncertain behavior of other specified digestive organs: Secondary | ICD-10-CM | POA: Diagnosis not present

## 2022-04-23 DIAGNOSIS — R918 Other nonspecific abnormal finding of lung field: Secondary | ICD-10-CM | POA: Insufficient documentation

## 2022-04-23 DIAGNOSIS — N182 Chronic kidney disease, stage 2 (mild): Secondary | ICD-10-CM | POA: Insufficient documentation

## 2022-04-23 DIAGNOSIS — R911 Solitary pulmonary nodule: Secondary | ICD-10-CM | POA: Diagnosis not present

## 2022-04-23 DIAGNOSIS — Z08 Encounter for follow-up examination after completed treatment for malignant neoplasm: Secondary | ICD-10-CM

## 2022-04-23 DIAGNOSIS — D7389 Other diseases of spleen: Secondary | ICD-10-CM | POA: Diagnosis not present

## 2022-04-23 DIAGNOSIS — I509 Heart failure, unspecified: Secondary | ICD-10-CM | POA: Insufficient documentation

## 2022-04-23 DIAGNOSIS — Z8042 Family history of malignant neoplasm of prostate: Secondary | ICD-10-CM | POA: Diagnosis not present

## 2022-04-23 LAB — CBC WITH DIFFERENTIAL/PLATELET
Abs Immature Granulocytes: 0.02 10*3/uL (ref 0.00–0.07)
Basophils Absolute: 0 10*3/uL (ref 0.0–0.1)
Basophils Relative: 1 %
Eosinophils Absolute: 0.1 10*3/uL (ref 0.0–0.5)
Eosinophils Relative: 3 %
HCT: 45 % (ref 36.0–46.0)
Hemoglobin: 14.5 g/dL (ref 12.0–15.0)
Immature Granulocytes: 0 %
Lymphocytes Relative: 32 %
Lymphs Abs: 1.6 10*3/uL (ref 0.7–4.0)
MCH: 30.1 pg (ref 26.0–34.0)
MCHC: 32.2 g/dL (ref 30.0–36.0)
MCV: 93.4 fL (ref 80.0–100.0)
Monocytes Absolute: 0.4 10*3/uL (ref 0.1–1.0)
Monocytes Relative: 8 %
Neutro Abs: 2.8 10*3/uL (ref 1.7–7.7)
Neutrophils Relative %: 56 %
Platelets: 172 10*3/uL (ref 150–400)
RBC: 4.82 MIL/uL (ref 3.87–5.11)
RDW: 13.2 % (ref 11.5–15.5)
WBC: 5 10*3/uL (ref 4.0–10.5)
nRBC: 0 % (ref 0.0–0.2)

## 2022-04-23 LAB — COMPREHENSIVE METABOLIC PANEL
ALT: 9 U/L (ref 0–44)
AST: 15 U/L (ref 15–41)
Albumin: 3.8 g/dL (ref 3.5–5.0)
Alkaline Phosphatase: 88 U/L (ref 38–126)
Anion gap: 4 — ABNORMAL LOW (ref 5–15)
BUN: 20 mg/dL (ref 8–23)
CO2: 27 mmol/L (ref 22–32)
Calcium: 9.6 mg/dL (ref 8.9–10.3)
Chloride: 110 mmol/L (ref 98–111)
Creatinine, Ser: 0.92 mg/dL (ref 0.44–1.00)
GFR, Estimated: >60 mL/min (ref >60)
Glucose, Bld: 113 mg/dL — ABNORMAL HIGH (ref 70–99)
Potassium: 3.9 mmol/L (ref 3.5–5.1)
Sodium: 141 mmol/L (ref 135–145)
Total Bilirubin: 0.5 mg/dL (ref 0.3–1.2)
Total Protein: 7 g/dL (ref 6.5–8.1)

## 2022-04-23 NOTE — Progress Notes (Signed)
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 oncology surgeon evaluation done by Dr. Maudie Mercury.  Patient elected to continue surveillance. Recent MRI abdomen MRCP showed increase size from 1.6cm to 2 cm. Discussed with patient about fine-needle biopsy via endoscopy ultrasound versus reimaging in 6 months Patient elected to continue surveillance. CEA and CA 19-9 have been monitored, levels are pending at the time of dictation.  Lung nodule 04/23/2018 which showed lung nodules  I have ordered CT chest and patient has not had it done yet.   We rescheduled the rationale of repeating CT scan and she agrees and request CT to be rescheduled.  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  This lesion was FDG negative on previous PET scan.  Orders Placed This Encounter  Procedures   CT CHEST NODULE FOLLOW UP LOW DOSE WO CM    Standing Status:   Future    Standing Expiration Date:   04/23/2023    Order Specific Question:   Preferred imaging location?    Answer:   [facility]   MR ABDOMEN MRCP W WO CONTAST    Standing Status:   Future    Standing Expiration Date:   04/24/2023    Order Specific Question:   If indicated for the ordered procedure, I authorize the administration of contrast media per Radiology protocol    Answer:   Yes    Order Specific Question:   What is the patient's sedation requirement?    Answer:   No Sedation    Order Specific Question:   Does the patient have a pacemaker or implanted devices?    Answer:   No    Order Specific Question:   Preferred imaging location?    Answer:   [facility] (table limit - 550lbs)   CBC with Differential/Platelet    Standing Status:   Future    Standing Expiration Date:    04/23/2023   Comprehensive metabolic panel    Standing Status:   Future    Standing Expiration Date:   04/23/2023   CEA    Standing Status:   Future    Standing Expiration Date:   04/23/2023   Cancer antigen 19-9    Standing Status:   Future    Standing Expiration Date:   04/23/2023   Follow-up in 6 months, repeat MRI All questions were answered. The patient knows to call the clinic with any problems, questions or concerns.  Earlie Server, MD, PhD [facility] Hematology Oncology 04/23/2022   REASON FOR VISIT:  Follow-up for pancreatic cyst, lung nodules  HISTORY OF PRESENTING ILLNESS:  [name] is a  81 y.o.  female presents for follow up of pancreatic cyst. lung nodules  CT chest showed bilateral bronchiectasis, lung nodules and thyroid nodules.. Incidental findings of ?kidney mass, not reported on radiologist. Subsequently CT abdomen pelvis was done which showed 2.3 lateral left upper pole renal cyst. Incidental finding of 4.9 cm rounded enhancing lesion in the anterior spleen, now from previous study in 2010. Patient was referred to [facility] for further evaluation.   #  seen by me on 07/03/2018.  At that time patient wants to be referred to [facility] oncology for further evaluation.  Patient called and requested another appointment for discussion of  her CT scan that was done in November 2020.  09/15/2018 PET scan at [facility] showed no focal lesions are discernible in the spleen. NO other hypermetabolic activity in chest abdomen pelvis to suggest malignancy.  Patient had a follow up CT done on 05/25/2019 CT abdomen pelvis w contrast showed indeterminate splenic lesion has no interval changes.  2m low attenudating lesion from the tai of the pancrease similar to prior CT, likely a side branch IPMN. Colonic diverticulosis  # 11/01/2019, [facility]  Patient was recommended to either proceed with endoscopy ultrasound/fine-needle aspiration or reimage in 6  months time by pancreatic protocol MRI was recommended.  Decision was made to continue follow-up with interval images and tumor markers.  #04/16/2021, MRI MRCP of the abdomen showed a stable size of the cystic lesion in the tail of the pancreas.  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    INTERVAL HISTORY Wanda KEAGYis a 81y.o. female who has above history reviewed by me today presents for follow up visit for spleen lesion and pancreatic lesion.  Patient has had MRI MRCP abdomen done.  She was also recommended to have a CT chest done for follow-up of small nodules in the lungs.  CT was not done.   She reports feeling,denies SOB, cough, abdominal pain.   Review of Systems  Constitutional:  Negative for appetite change, chills, fatigue, fever and unexpected weight change.  HENT:   Negative for hearing loss and voice change.   Eyes:  Negative for eye problems.  Respiratory:  Negative for chest tightness and cough.   Cardiovascular:  Negative for chest pain.  Gastrointestinal:  Negative for abdominal distention, abdominal pain and blood in stool.  Endocrine: Negative for hot flashes.  Genitourinary:  Negative for difficulty urinating and frequency.   Musculoskeletal:  Positive for back pain. Negative for arthralgias.  Skin:  Negative for itching and rash.  Neurological:  Negative for extremity weakness.  Hematological:  Negative for adenopathy.  Psychiatric/Behavioral:  Negative for confusion.     MEDICAL HISTORY:  Past Medical History:  Diagnosis Date   Acquired hammertoe    other   Acquired hammertoe    Angina pectoris, unspecified ([place]    Angina pectoris, unspecified ([place]    Arthritis    knees   [place]   left breast   Cancer ([place] 1990,s   left breast   CHF (congestive heart failure) ([place]    Chronic kidney disease    stage 2   Degenerative joint disease    Dermatophytosis of nail    Diabetes mellitus type 2, uncomplicated  (HCC)    diet controlled   Dyspnea    Essential hypertension, benign    Exostosis    unspecified site   Fracture of right ankle 2010   Followed by Dr. FVickki Muff  GERD (gastroesophageal reflux disease)    Hemorrhoids    Hypercholesterolemia    Hyperlipidemia, unspecified    Hypertension    Lymphedema of leg    left leg worse than right leg.   Obesity, unspecified    Osteoarthrosis involving lower leg    unspecified whether generalized or localized    Pre-diabetes    Pure hypercholesterolemia    Vitamin D deficiency, unspecified    Wears dentures    partial upper    SURGICAL HISTORY: Past Surgical History:  Procedure Laterality Date   BREAST LUMPECTOMY Left 1995   BREAST SURGERY Left  1990   lumpectomy   BUNIONECTOMY Bilateral 1990's   plus hammer toe repair on left foot.   CATARACT EXTRACTION W/PHACO Left 08/10/2018   Procedure: CATARACT EXTRACTION PHACO AND INTRAOCULAR LENS PLACEMENT ([place])  LEFT DIABETIC;  Surgeon: Eulogio Bear, MD;  Location: [place];  Service: Ophthalmology;  Laterality: Left;  diabetic - diet controlled   CATARACT EXTRACTION W/PHACO Right 11/16/2018   Procedure: CATARACT EXTRACTION PHACO AND INTRAOCULAR LENS PLACEMENT ([place])  RIGHT DIABETIC;  Surgeon: Eulogio Bear, MD;  Location: [place];  Service: Ophthalmology;  Laterality: Right;  Diabetic - diet controlled   COLONOSCOPY     COLONOSCOPY N/A 11/01/2021   Procedure: COLONOSCOPY;  Surgeon: Annamaria Helling, DO;  Location: [facility] ENDOSCOPY;  Service: Gastroenterology;  Laterality: N/A;  DIET CONTROLLED   COLONOSCOPY WITH PROPOFOL N/A 05/08/2018   Procedure: COLONOSCOPY WITH PROPOFOL;  Surgeon: Lollie Sails, MD;  Location: [facility] ENDOSCOPY;  Service: Endoscopy;  Laterality: N/A;   ENDOMETRIAL BIOPSY     EYE SURGERY     FOOT SURGERY Left    for bunionectomy and hammertoe   JOINT REPLACEMENT     TONSILLECTOMY     TOTAL KNEE ARTHROPLASTY Left 04/08/2017   Procedure:  TOTAL KNEE ARTHROPLASTY;  Surgeon: Corky Mull, MD;  Location: [facility] ORS;  Service: Orthopedics;  Laterality: Left;   TOTAL KNEE ARTHROPLASTY Right 08/19/2017   Procedure: TOTAL KNEE ARTHROPLASTY;  Surgeon: Corky Mull, MD;  Location: [facility] ORS;  Service: Orthopedics;  Laterality: Right;    SOCIAL HISTORY: Social History   Socioeconomic History   Marital status: Widowed    Spouse name: Not on file   Number of children: 2   Years of education: Not on file   Highest education level: Not on file  Occupational History   Occupation: Retired  Tobacco Use   Smoking status: Never   Smokeless tobacco: Never  Vaping Use   Vaping Use: Never used  Substance and Sexual Activity   Alcohol use: Yes    Comment: 1-2 drinks every 1-2 months   Drug use: No   Sexual activity: Not on file  Other Topics Concern   Not on file  Social History Narrative   Not on file   Social Determinants of Health   Financial Resource Strain: Not on file  Food Insecurity: Not on file  Transportation Needs: Not on file  Physical Activity: Not on file  Stress: Not on file  Social Connections: Not on file  Intimate Partner Violence: Not on file    FAMILY HISTORY: Family History  Problem Relation Age of Onset   Stroke Mother    Hypertension Mother    Cancer Father    Prostate cancer Brother    Breast cancer Maternal Aunt 89   Breast cancer Cousin        mat cousin   Kidney cancer Neg Hx    Kidney disease Neg Hx     ALLERGIES:  is allergic to colesevelam; oxybutynin; rosuvastatin; and zoster vaccine recombinant, adjuvanted.  MEDICATIONS:  Current Outpatient Medications  Medication Sig Dispense Refill   acetaminophen (TYLENOL) 325 MG tablet Take 1,000 mg by mouth 2 (two) times daily.      albuterol (PROVENTIL HFA;VENTOLIN HFA) 108 (90 Base) MCG/ACT inhaler Inhale 2 puffs into the lungs every 6 (six) hours as needed for wheezing or shortness of breath. 1 Inhaler 0   atorvastatin (LIPITOR) 20 MG  tablet Take 1 tablet by mouth daily.     Blood  Glucose Monitoring Suppl (FIFTY50 GLUCOSE METER 2.0) w/Device KIT Use as directed BAYER CONTOUR NEXT METER DX: E11.9     CONTOUR NEXT TEST test strip      ergocalciferol (VITAMIN D2) 1.25 MG (50000 UT) capsule Take 50,000 Units by mouth once a week.     Fluticasone-Umeclidin-Vilant (TRELEGY ELLIPTA) 100-62.5-25 MCG/INH AEPB Inhale into the lungs as needed.     furosemide (LASIX) 40 MG tablet Take one tablet by mouth once daily as needed for lower extremity edema     gabapentin (NEURONTIN) 300 MG capsule Take 300 mg by mouth at bedtime.     hydrocortisone (ANUSOL-HC) 2.5 % rectal cream Place 1 application rectally 2 (two) times daily.     magnesium oxide (MAG-OX) 400 MG tablet Take 800 mg by mouth daily.      nystatin cream (MYCOSTATIN) Apply 1 application topically 2 (two) times daily as needed. Apply thin film to yeast rash under the breasts     Omega-3 Krill Oil 500 MG CAPS Take 500 mg by mouth 3 (three) times a week. On Monday Wednesday and Friday     omeprazole (PRILOSEC) 20 MG capsule Take 20 mg by mouth daily. Acid reflux      polyethylene glycol-electrolytes (NULYTELY) 420 g solution Take 4,000 mLs by mouth once.     sennosides-docusate sodium (SENOKOT-S) 8.6-50 MG tablet Take 2 tablets by mouth 2 (two) times daily.      solifenacin (VESICARE) 5 MG tablet Take 5 mg by mouth daily.     TOPROL XL 25 MG 24 hr tablet Take 25 mg by mouth at bedtime.     triamterene-hydrochlorothiazide (DYAZIDE) 37.5-25 MG capsule Take 1 capsule by mouth daily.     WIXELA INHUB 250-50 MCG/ACT AEPB Inhale 1 puff into the lungs 2 (two) times daily.     traMADol (ULTRAM) 50 MG tablet Take 50 mg by mouth every 6 (six) hours as needed. (Patient not taking: Reported on 04/23/2022)     No current facility-administered medications for this visit.     PHYSICAL EXAMINATION: ECOG PERFORMANCE STATUS: 1 - Symptomatic but completely ambulatory Vitals:   04/23/22 1039  BP:  137/74  Pulse: (!) 55  Resp: 18  Temp: 97.8 F (36.6 C)   Filed Weights   04/23/22 1039  Weight: 252 lb 6.4 oz (114.5 kg)    Physical Exam Constitutional:      General: She is not in acute distress.    Appearance: She is obese.  HENT:     Head: Normocephalic and atraumatic.  Eyes:     General: No scleral icterus.    Pupils: Pupils are equal, round, and reactive to light.  Cardiovascular:     Rate and Rhythm: Normal rate and regular rhythm.     Heart sounds: Normal heart sounds.  Pulmonary:     Effort: Pulmonary effort is normal. No respiratory distress.     Breath sounds: No wheezing.  Abdominal:     General: Bowel sounds are normal. There is no distension.     Palpations: Abdomen is soft. There is no mass.     Tenderness: There is no abdominal tenderness.  Musculoskeletal:        General: No deformity. Normal range of motion.     Cervical back: Normal range of motion and neck supple.     Comments: Bilateral lower extremity trace edema chronic  Skin:    General: Skin is warm and dry.     Findings: No erythema or rash.  Neurological:  Mental Status: She is alert and oriented to person, place, and time. Mental status is at baseline.     Cranial Nerves: No cranial nerve deficit.     Coordination: Coordination normal.  Psychiatric:        Mood and Affect: Mood normal.        Behavior: Behavior normal.        Thought Content: Thought content normal.      LABORATORY DATA:  I have reviewed the data as listed    Latest Ref Rng & Units 04/23/2022   10:14 AM 04/20/2021    9:46 AM 04/14/2020    9:10 AM  CBC  WBC 4.0 - 10.5 K/uL 5.0  5.8  5.2   Hemoglobin 12.0 - 15.0 g/dL 14.5  14.8  14.7   Hematocrit 36.0 - 46.0 % 45.0  44.1  44.5   Platelets 150 - 400 K/uL 172  172  164       Latest Ref Rng & Units 04/23/2022   10:14 AM 04/20/2021    9:46 AM 07/18/2020    2:05 PM  CMP  Glucose 70 - 99 mg/dL 113  109    BUN 8 - 23 mg/dL 20  20    Creatinine 0.44 - 1.00  mg/dL 0.92  1.03  1.10   Sodium 135 - 145 mmol/L 141  139    Potassium 3.5 - 5.1 mmol/L 3.9  3.9    Chloride 98 - 111 mmol/L 110  106    CO2 22 - 32 mmol/L 27  28    Calcium 8.9 - 10.3 mg/dL 9.6  9.6    Total Protein 6.5 - 8.1 g/dL 7.0  7.0    Total Bilirubin 0.3 - 1.2 mg/dL 0.5  0.9    Alkaline Phos 38 - 126 U/L 88  90    AST 15 - 41 U/L 15  17    ALT 0 - 44 U/L 9  11       Iron/TIBC/Ferritin/ %Sat No results found for: "IRON", "TIBC", "FERRITIN", "IRONPCTSAT"   RADIOGRAPHIC STUDIES: I have personally reviewed the radiological images as listed and agreed with the findings in the report. MR ABDOMEN MRCP W WO CONTAST  Result Date: 04/20/2022 CLINICAL DATA:  Follow-up cystic pancreatic lesion. EXAM: MRI ABDOMEN WITHOUT AND WITH CONTRAST (INCLUDING MRCP) TECHNIQUE: Multiplanar multisequence MR imaging of the abdomen was performed both before and after the administration of intravenous contrast. Heavily T2-weighted images of the biliary and pancreatic ducts were obtained, and three-dimensional MRCP images were rendered by post processing. CONTRAST:  52m GADAVIST GADOBUTROL 1 MMOL/ML IV SOLN COMPARISON:  Multiple priors including most recent MRI April 16, 2021. FINDINGS: Lower chest: No acute abnormality. Hepatobiliary: No significant hepatic steatosis. No suspicious hepatic lesion. Gallbladder is unremarkable. No biliary ductal dilation. Pancreas: Well-circumscribed cystic lesion in the pancreatic tail measures 20 x 13 x 20 mm on image 11/4 and 9/3 previously measuring 16 x 13 x 12 mm and on next most recent prior examination measuring 17 x 15 x 11 mm. The lesion again demonstrating a few thin internal septations without suspicious nodular postcontrast enhancement or wall/septal thickening. No pancreatic ductal dilation. Spleen: Stable solid enhancing splenic mass measuring 5.5 x 4.1 cm on image 7/4, not significantly changed dating back to 2019. Adrenals/Urinary Tract: Bilateral adrenal glands  appear normal. Stable fluid intensity left upper pole renal lesion which does not demonstrate suspicious features and is consistent with a benign Bosniak classification 1 renal cyst requiring no independent imaging  follow-up. Stomach/Bowel: Small hiatal hernia. No pathologic dilation or evidence of acute inflammation involving loops of large or small bowel in the abdomen. Vascular/Lymphatic: No pathologically enlarged lymph nodes identified. No abdominal aortic aneurysm demonstrated. Other:  No significant abdominal free fluid. Musculoskeletal: No suspicious bone lesions identified. IMPRESSION: 1. Increased size of the multi-septated 2 cm well-circumscribed cystic lesion in the pancreatic tail which does not demonstrate suspicious MRI features. However, given increase in size consider further evaluation with fluid sampling/FNA otherwise recommend follow up pre and post contrast MRI/MRCP in 6 months. 2. Stable solid enhancing splenic mass measuring 5.5 x 4.1 cm, not significantly changed dating back to 2019 consistent with a benign splenic hamartoma or SANT. Electronically Signed   By: Dahlia Bailiff M.D.   On: 04/20/2022 15:40

## 2022-04-23 NOTE — Assessment & Plan Note (Signed)
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  This lesion was FDG negative on previous PET scan.

## 2022-04-23 NOTE — Assessment & Plan Note (Signed)
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 Maudie Mercury.  Patient elected to continue surveillance. Recent MRI abdomen MRCP showed increase size from 1.6cm to 2 cm. Discussed with patient about fine-needle biopsy via endoscopy ultrasound versus reimaging in 6 months Patient elected to continue surveillance. CEA and CA 19-9 have been monitored, levels are pending at the time of dictation.

## 2022-04-23 NOTE — Progress Notes (Signed)
Patient here for follow up. No new concerns voiced.  °

## 2022-04-23 NOTE — Assessment & Plan Note (Signed)
04/23/2018 which showed lung nodules  I have ordered CT chest and patient has not had it done yet.   We rescheduled the rationale of repeating CT scan and she agrees and request CT to be rescheduled.

## 2022-04-24 LAB — CEA: CEA: 1.5 ng/mL (ref 0.0–4.7)

## 2022-04-24 LAB — CANCER ANTIGEN 19-9: CA 19-9: 9 U/mL (ref 0–35)

## 2022-04-29 ENCOUNTER — Encounter (INDEPENDENT_AMBULATORY_CARE_PROVIDER_SITE_OTHER): Payer: Self-pay

## 2022-05-02 ENCOUNTER — Ambulatory Visit
Admission: RE | Admit: 2022-05-02 | Discharge: 2022-05-02 | Disposition: A | Discharge status: Home / Self Care | Payer: Medicare Other | Source: Ambulatory Visit | Attending: Oncology | Admitting: Oncology

## 2022-05-02 DIAGNOSIS — R911 Solitary pulmonary nodule: Secondary | ICD-10-CM | POA: Insufficient documentation

## 2022-05-16 ENCOUNTER — Ambulatory Visit (LOCAL_COMMUNITY_HEALTH_CENTER): Payer: Medicare Other

## 2022-05-16 DIAGNOSIS — Z23 Encounter for immunization: Secondary | ICD-10-CM | POA: Diagnosis not present

## 2022-05-16 DIAGNOSIS — Z719 Counseling, unspecified: Secondary | ICD-10-CM

## 2022-05-16 NOTE — Progress Notes (Signed)
In nurse clinic requesting flu vaccine and RSV vaccine.  VISs given.  Administered Fluzone high dose and RSV (Abrysvo) vaccines; tolerated well.  [facility] updated; copy mailed to pt because not given at visit.    Milagros Reap RN

## 2022-05-31 ENCOUNTER — Ambulatory Visit (LOCAL_COMMUNITY_HEALTH_CENTER): Payer: Medicare Other

## 2022-05-31 DIAGNOSIS — Z23 Encounter for immunization: Secondary | ICD-10-CM | POA: Diagnosis not present

## 2022-05-31 DIAGNOSIS — Z719 Counseling, unspecified: Secondary | ICD-10-CM

## 2022-05-31 NOTE — Progress Notes (Signed)
  Are you feeling sick today? No   Have you ever received a dose of COVID-19 Vaccine? [employer], [place], [place], [place], Other) Yes  If yes, which vaccine and how many doses?   PFIZER, 4   Did you bring the vaccination record card or other documentation?  Yes   Do you have a health condition or are undergoing treatment that makes you moderately or severely immunocompromised? This would include, but not be limited to: cancer, HIV, organ transplant, immunosuppressive therapy/high-dose corticosteroids, or moderate/severe primary immunodeficiency.  No  Have you received COVID-19 vaccine before or during hematopoietic cell transplant (HCT) or CAR-T-cell therapies? No  Have you ever had an allergic reaction to: (This would include a severe allergic reaction or a reaction that caused hives, swelling, or respiratory distress, including wheezing.) A component of a COVID-19 vaccine or a previous dose of COVID-19 vaccine? No   Have you ever had an allergic reaction to another vaccine (other thanCOVID-19 vaccine) or an injectable medication? (This would include a severe allergic reaction or a reaction that caused hives, swelling, or respiratory distress, including wheezing.)   Yes    Do you have a history of any of the following:  Myocarditis or Pericarditis No  Dermal fillers:  No  Multisystem Inflammatory Syndrome (MIS-C or MIS-A)? No  COVID-19 disease within the past 3 months? No  Vaccinated with monkeypox vaccine in the last 4 weeks? No  Eligible, administered [place] [phone], monitored, tolerated well. M.Makoto Sellitto, LPN.

## 2022-07-10 ENCOUNTER — Ambulatory Visit
Admission: RE | Admit: 2022-07-10 | Discharge: 2022-07-10 | Disposition: A | Discharge status: Home / Self Care | Payer: Medicare Other | Source: Ambulatory Visit | Attending: Podiatry | Admitting: Podiatry

## 2022-07-10 ENCOUNTER — Other Ambulatory Visit: Payer: Self-pay | Admitting: Podiatry

## 2022-07-10 DIAGNOSIS — R600 Localized edema: Secondary | ICD-10-CM

## 2022-07-10 DIAGNOSIS — M25572 Pain in left ankle and joints of left foot: Secondary | ICD-10-CM | POA: Diagnosis present

## 2022-07-12 ENCOUNTER — Encounter (INDEPENDENT_AMBULATORY_CARE_PROVIDER_SITE_OTHER): Payer: Self-pay | Admitting: Nurse Practitioner

## 2022-07-12 ENCOUNTER — Encounter: Payer: Medicare Other | Attending: Physician Assistant | Admitting: Physician Assistant

## 2022-07-12 ENCOUNTER — Ambulatory Visit (INDEPENDENT_AMBULATORY_CARE_PROVIDER_SITE_OTHER): Payer: Medicare Other | Admitting: Nurse Practitioner

## 2022-07-12 ENCOUNTER — Ambulatory Visit (INDEPENDENT_AMBULATORY_CARE_PROVIDER_SITE_OTHER): Payer: Medicare Other

## 2022-07-12 VITALS — BP 135/80 | HR 69 | Ht 71.0 in | Wt 252.0 lb

## 2022-07-12 DIAGNOSIS — L97822 Non-pressure chronic ulcer of other part of left lower leg with fat layer exposed: Secondary | ICD-10-CM | POA: Insufficient documentation

## 2022-07-12 DIAGNOSIS — E119 Type 2 diabetes mellitus without complications: Secondary | ICD-10-CM | POA: Diagnosis not present

## 2022-07-12 DIAGNOSIS — I890 Lymphedema, not elsewhere classified: Secondary | ICD-10-CM | POA: Diagnosis not present

## 2022-07-12 DIAGNOSIS — E11622 Type 2 diabetes mellitus with other skin ulcer: Secondary | ICD-10-CM | POA: Insufficient documentation

## 2022-07-12 DIAGNOSIS — N183 Chronic kidney disease, stage 3 unspecified: Secondary | ICD-10-CM | POA: Insufficient documentation

## 2022-07-12 DIAGNOSIS — Z6836 Body mass index (BMI) 36.0-36.9, adult: Secondary | ICD-10-CM | POA: Insufficient documentation

## 2022-07-12 DIAGNOSIS — I130 Hypertensive heart and chronic kidney disease with heart failure and stage 1 through stage 4 chronic kidney disease, or unspecified chronic kidney disease: Secondary | ICD-10-CM | POA: Diagnosis not present

## 2022-07-12 DIAGNOSIS — E1122 Type 2 diabetes mellitus with diabetic chronic kidney disease: Secondary | ICD-10-CM | POA: Insufficient documentation

## 2022-07-12 DIAGNOSIS — I6523 Occlusion and stenosis of bilateral carotid arteries: Secondary | ICD-10-CM

## 2022-07-12 DIAGNOSIS — I5042 Chronic combined systolic (congestive) and diastolic (congestive) heart failure: Secondary | ICD-10-CM | POA: Diagnosis not present

## 2022-07-12 DIAGNOSIS — E669 Obesity, unspecified: Secondary | ICD-10-CM | POA: Diagnosis not present

## 2022-07-12 DIAGNOSIS — I10 Essential (primary) hypertension: Secondary | ICD-10-CM

## 2022-07-17 NOTE — Progress Notes (Signed)
JOLANA, RUNKLES ([id number]) [id number] Page 1 of 5 Visit Report for 07/12/2022 Abuse Risk Screen Details Patient Name: Date of Service: [place] 07/12/2022 11:30 A M Medical Record Number: [id number] Patient Account Number: [id number] Date of Birth/Sex: Treating RN: [birth year] (82 y.o. Orvan Falconer Primary Care Zamoria Boss: Tracie Harrier Other Clinician: Referring Mozel Burdett: Treating Perseus Westall/Extender: Brent Bulla in Treatment: 0 Abuse Risk Screen Items Answer ABUSE RISK SCREEN: Has anyone close to you tried to hurt or harm you recentlyo No Do you feel uncomfortable with anyone in your familyo No Has anyone forced you do things that you didnt want to doo No Electronic Signature(s) Signed: 07/17/2022 4:29:22 PM By: Carlene Coria RN Entered By: Carlene Coria on 07/12/2022 11:24:56 -------------------------------------------------------------------------------- Activities of Daily Living Details Patient Name: Date of Service: [place]. 07/12/2022 11:30 A M Medical Record Number: [id number] Patient Account Number: [id number] Date of Birth/Sex: Treating RN: 1941-01-19 (82 y.o. Orvan Falconer Primary Care Dorcas Melito: Tracie Harrier Other Clinician: Referring Shalon Councilman: Treating Mystique Bjelland/Extender: Brent Bulla in Treatment: 0 Activities of Daily Living Items Answer Activities of Daily Living (Please select one for each item) Drive Automobile Completely Able T Medications ake Completely Able Use T elephone Completely Able Care for Appearance Completely Able Use T oilet Completely Able Bath / Shower Completely Able Dress Self Completely Able Feed Self Completely Able Walk Completely Able Get In / Out Bed Completely Able Housework Completely Able SOLEIA, BADOLATO P ([id number]) [id number] Page 2 of 5 Prepare Meals Completely Able Handle Money  Completely Able Shop for Self Completely Able Electronic Signature(s) Signed: 07/17/2022 4:29:22 PM By: Carlene Coria RN Entered By: Carlene Coria on 07/12/2022 11:25:20 -------------------------------------------------------------------------------- Education Screening Details Patient Name: Date of Service: [name], [name] 07/12/2022 11:30 A M Medical Record Number: [id number] Patient Account Number: [id number] Date of Birth/Sex: Treating RN: [birth year] (82 y.o. Orvan Falconer Primary Care Tyhir Schwan: Tracie Harrier Other Clinician: Referring Emilo Gras: Treating Cheresa Siers/Extender: Brent Bulla in Treatment: 0 Primary Learner Assessed: Patient Learning Preferences/Education Level/Primary Language Learning Preference: Explanation Highest Education Level: College or Above Preferred Language: English Cognitive Barrier Language Barrier: No Translator Needed: No Memory Deficit: No Emotional Barrier: No Cultural/Religious Beliefs Affecting Medical Care: No Physical Barrier Impaired Vision: No Impaired Hearing: No Decreased Hand dexterity: No Knowledge/Comprehension Knowledge Level: Medium Comprehension Level: High Ability to understand written instructions: High Ability to understand verbal instructions: High Motivation Anxiety Level: Anxious Cooperation: Cooperative Education Importance: Acknowledges Need Interest in Health Problems: Asks Questions Perception: Coherent Willingness to Engage in Self-Management High Activities: Readiness to Engage in Self-Management High Activities: Electronic Signature(s) Signed: 07/17/2022 4:29:22 PM By: Carlene Coria RN Entered By: Carlene Coria on 07/12/2022 11:25:50 Spero Curb ([id number]) [id number] Page 3 of 5 -------------------------------------------------------------------------------- Fall Risk Assessment Details Patient Name: Date of Service: [place], [name]  07/12/2022 11:30 A M Medical Record Number: [id number] Patient Account Number: [id number] Date of Birth/Sex: Treating RN: Jun 08, 1941 (82 y.o. Orvan Falconer Primary Care Byrne Capek: Tracie Harrier Other Clinician: Referring Harshini Trent: Treating Cleto Claggett/Extender: Brent Bulla in Treatment: 0 Fall Risk Assessment Items Have you had 2 or more falls in the last 12 monthso 0 No Have you had any fall that resulted in injury in the last 12 monthso 0 No FALLS RISK SCREEN History of falling - immediate or within 3 months 25 Yes Secondary diagnosis (Do you have 2 or more medical diagnoseso) 0 No  Ambulatory aid None/bed rest/wheelchair/nurse 0 Yes Crutches/cane/walker 0 No Furniture 0 No Intravenous therapy Access/Saline/Heparin Lock 0 No Gait/Transferring Normal/ bed rest/ wheelchair 0 Yes Weak (short steps with or without shuffle, stooped but able to lift head while walking, may seek 0 No support from furniture) Impaired (short steps with shuffle, may have difficulty arising from chair, head down, impaired 0 No balance) Mental Status Oriented to own ability 0 Yes Electronic Signature(s) Signed: 07/17/2022 4:29:22 PM By: Carlene Coria RN Entered By: Carlene Coria on 07/12/2022 11:26:09 -------------------------------------------------------------------------------- Foot Assessment Details Patient Name: Date of Service: [place], [name] 07/12/2022 11:30 A M Medical Record Number: [id number] Patient Account Number: [id number] Date of Birth/Sex: Treating RN: 1940/12/01 (82 y.o. Orvan Falconer Primary Care Caron Ode: Tracie Harrier Other Clinician: Referring Issa Luster: Treating Daking Westervelt/Extender: Brent Bulla in Treatment: 0 Foot Assessment Items Site Locations [place], [place] P ([id number]) [phone] [id number] Page 4 of 5 + = Sensation present, - = Sensation absent, C = Callus, U = Ulcer R = Redness, W = Warmth, M =  Maceration, PU = Pre-ulcerative lesion F = Fissure, S = Swelling, D = Dryness Assessment Right: Left: Other Deformity: No No Prior Foot Ulcer: No No Prior Amputation: No No Charcot Joint: No No Ambulatory Status: Ambulatory Without Help Gait: Steady Electronic Signature(s) Signed: 07/17/2022 4:29:22 PM By: Carlene Coria RN Entered By: Carlene Coria on 07/12/2022 11:33:32 -------------------------------------------------------------------------------- Nutrition Risk Screening Details Patient Name: Date of Service: [place], [name] 07/12/2022 11:30 A M Medical Record Number: [id number] Patient Account Number: [id number] Date of Birth/Sex: Treating RN: [birth year] (82 y.o. Orvan Falconer Primary Care Chaunta Bejarano: Tracie Harrier Other Clinician: Referring Naheem Mosco: Treating Asani Deniston/Extender: Brent Bulla in Treatment: 0 Height (in): 69 Weight (lbs): 250 Body Mass Index (BMI): 36.9 Nutrition Risk Screening Items Score Screening NUTRITION RISK SCREEN: I have an illness or condition that made me change the kind and/or amount of food I eat 2 Yes I eat fewer than two meals per day 0 No I eat few fruits and vegetables, or milk products 0 No I have three or more drinks of beer, liquor or wine almost every day 0 No I have tooth or mouth problems that make it hard for me to eat 0 No I don't always have enough money to buy the food I need 0 No Mollica, Jenin P ([id number]) [id number] Page 5 of 5 I eat alone most of the time 0 No I take three or more different prescribed or over-the-counter drugs a day 1 Yes Without wanting to, I have lost or gained 10 pounds in the last six months 0 No I am not always physically able to shop, cook and/or feed myself 0 No Nutrition Protocols Good Risk Protocol Moderate Risk Protocol 0 Provide education on nutrition High Risk Proctocol Risk Level: Moderate Risk Score: 3 Electronic  Signature(s) Signed: 07/17/2022 4:29:22 PM By: Carlene Coria RN Entered By: Carlene Coria on 07/12/2022 11:26:25

## 2022-07-17 NOTE — Progress Notes (Signed)
NATURI, Wanda ([id number]) [id number] Page 1 of 10 Visit Report for 07/12/2022 Chief Complaint Document Details Patient Name: Date of Service: [place] 07/12/2022 11:30 A M Medical Record Number: [id number] Patient Account Number: [id number] Date of Birth/Sex: Treating RN: [birth year] (82 y.o. [name] Primary Care Provider: Tracie [name] Other Clinician: Referring Provider: Treating Provider/Extender: [name] in Treatment: 0 Information Obtained from: Patient Chief Complaint Left LE Ulcers Electronic Signature(s) Signed: 07/12/2022 11:58:41 AM By: Worthy Keeler PA-C Entered By: Worthy [name] on 07/12/2022 11:58:41 -------------------------------------------------------------------------------- Debridement Details Patient Name: Date of Service: [place], [name] 07/12/2022 11:30 A M Medical Record Number: [id number] Patient Account Number: [id number] Date of Birth/Sex: Treating RN: [birth year] (82 y.o. [name] Primary Care Provider: Tracie [name] Other Clinician: Referring Provider: Treating Provider/Extender: [name] in Treatment: 0 Debridement Performed for Assessment: Wound #1 Left,Anterior Lower Leg Performed By: Physician Wanda Sams., PA-C Debridement Type: Debridement Severity of Tissue Pre Debridement: Fat layer exposed Level of Consciousness (Pre-procedure): Awake and Alert Pre-procedure Verification/Time Out Yes - 12:05 Taken: Start Time: 12:05 T Area Debrided (L x W): otal 3.5 (cm) x 3 (cm) = 10.5 (cm) Tissue and other material debrided: Viable, Non-Viable, Slough, Subcutaneous, Slough Level: Skin/Subcutaneous Tissue Debridement Description: Excisional Instrument: Curette Bleeding: Minimum Hemostasis Achieved: Pressure End Time: 12:08 Procedural Pain: 0 Post Procedural Pain: 0 Response to Treatment: Procedure was tolerated well [name],  [name] ([id number]) [id number] Page 2 of 10 Level of Consciousness (Post- Awake and Alert procedure): Post Debridement Measurements of Total Wound Length: (cm) 3.5 Width: (cm) 3 Depth: (cm) 0.1 Volume: (cm) 0.825 Character of Wound/Ulcer Post Debridement: Improved Severity of Tissue Post Debridement: Fat layer exposed Post Procedure Diagnosis Same as Pre-procedure Electronic Signature(s) Signed: 07/12/2022 1:28:04 PM By: Worthy Keeler PA-C Signed: 07/17/2022 4:29:22 PM By: Wanda Coria RN Entered By: [name] on 07/12/2022 12:06:41 -------------------------------------------------------------------------------- Debridement Details Patient Name: Date of Service: [name], [name] 07/12/2022 11:30 A M Medical Record Number: [id number] Patient Account Number: [id number] Date of Birth/Sex: Treating RN: 1940/09/23 (82 y.o. [name] Primary Care Provider: Tracie [name] Other Clinician: Referring Provider: Treating Provider/Extender: [name] in Treatment: 0 Debridement Performed for Assessment: Wound #2 Left,Lateral Lower Leg Performed By: Physician Wanda Sams., PA-C Debridement Type: Debridement Severity of Tissue Pre Debridement: Fat layer exposed Level of Consciousness (Pre-procedure): Awake and Alert Pre-procedure Verification/Time Out Yes - 12:05 Taken: Start Time: 12:05 T Area Debrided (L x W): otal 2 (cm) x 1.8 (cm) = 3.6 (cm) Tissue and other material debrided: Viable, Non-Viable, Slough, Subcutaneous, Slough Level: Skin/Subcutaneous Tissue Debridement Description: Excisional Instrument: Curette Bleeding: Minimum Hemostasis Achieved: Pressure End Time: 12:08 Procedural Pain: 0 Post Procedural Pain: 0 Response to Treatment: Procedure was tolerated well Level of Consciousness (Post- Awake and Alert procedure): Post Debridement Measurements of Total Wound Length: (cm) 2 Width: (cm) 1.8 Depth:  (cm) 0.2 Volume: (cm) 0.565 Character of Wound/Ulcer Post Debridement: Improved Severity of Tissue Post Debridement: Fat layer exposed Post Procedure Diagnosis Same as Pre-procedure [name], [name] ([id number]) [id number] Page 3 of 10 Electronic Signature(s) Signed: 07/12/2022 1:28:04 PM By: Worthy Keeler PA-C Signed: 07/17/2022 4:29:22 PM By: Wanda Coria RN Entered By: [name] on 07/12/2022 12:07:33 -------------------------------------------------------------------------------- HPI Details Patient Name: Date of Service: [name], [name] 07/12/2022 11:30 A M Medical Record Number: [id number] Patient Account Number: [id number] Date of Birth/Sex: Treating RN: 1940/12/01 (  82 y.o. [name] Primary Care Provider: Tracie [name] Other Clinician: Referring Provider: Treating Provider/Extender: [name] in Treatment: 0 History of Present Illness HPI Description: 07-12-2022 upon evaluation today patient presents for initial inspection concerning issues she has been having with wounds on her left lower extremity. She thinks she may have hit the lateral portion of her leg on the dishwasher door but the other she thinks may have been a tape injury. It is more of a blister type situation. With that being said she tells me that she is having some discomfort although is not terrible at this point. Fortunately I do not see any signs of infection locally or systemically at this time which is great news. No fevers, chills, nausea, vomiting, or diarrhea. Patient does have a history of diabetes mellitus type 2, lymphedema for which she is not really able to wear compression socks that she tells me she cannot get them on. She also has hypertension, congestive heart failure, and chronic kidney disease stage III. Electronic Signature(s) Signed: 07/12/2022 1:06:17 PM By: Worthy Keeler PA-C Entered By: Worthy [name] on 07/12/2022  13:06:16 -------------------------------------------------------------------------------- Physical Exam Details Patient Name: Date of Service: [name], [name] 07/12/2022 11:30 A M Medical Record Number: [id number] Patient Account Number: [id number] Date of Birth/Sex: Treating RN: [birth year] (82 y.o. [name] Primary Care Provider: Tracie [name] Other Clinician: Referring Provider: Treating Provider/Extender: [name] in Treatment: 0 Constitutional patient is hypertensive.. pulse regular and within target range for patient.Marland Kitchen respirations regular, non-labored and within target range for patient.Marland Kitchen temperature within target range for patient.. Obese and well-hydrated in no acute distress. Eyes conjunctiva clear no eyelid edema noted. pupils equal round and reactive to light and accommodation. Ears, Nose, Mouth, and Throat no gross abnormality of ear auricles or external auditory canals. normal hearing noted during conversation. mucus membranes moist. [name], [name] ([id number]) [phone].pdf Page 4 of 10 Respiratory normal breathing without difficulty. Cardiovascular 1+ dorsalis pedis/posterior tibialis pulses. 2+ pitting edema of the bilateral lower extremities. Musculoskeletal normal gait and posture. no significant deformity or arthritic changes, no loss or range of motion, no clubbing. Psychiatric this patient is able to make decisions and demonstrates good insight into disease process. Alert and Oriented x 3. pleasant and cooperative. Notes Upon inspection patient's wounds actually are going to require some sharp debridement to clearway some of the necrotic debris. I discussed that with her today and explained the benefits and risk of any debridement as well. She is in agreement with proceeding. Her blood pressure is elevated though she tells me she did not take her medication today. She is also been somewhat stressed  due to the fact that she had a difficult time finding our office which is completely understandable. Nonetheless this is something that if it is maintaining high all the time she needs to be seen by her primary care provider ASAP in order to discuss this and determine what the best steps are going forward to get her blood pressure under good control. Electronic Signature(s) Signed: 07/12/2022 1:07:31 PM By: Worthy Keeler PA-C Previous Signature: 07/12/2022 1:07:03 PM Version By: Worthy Keeler PA-C Entered By: Worthy [name] on 07/12/2022 13:07:31 -------------------------------------------------------------------------------- Physician Orders Details Patient Name: Date of Service: [name], [name] 07/12/2022 11:30 A M Medical Record Number: [id number] Patient Account Number: [id number] Date of Birth/Sex: Treating RN: [birth year] (82 y.o. [name] Primary Care Provider: Tracie [name] Other Clinician: Referring  Provider: Treating Provider/Extender: [name] in Treatment: 0 Verbal / Phone Orders: No Diagnosis Coding ICD-10 Coding Code Description E11.622 Type 2 diabetes mellitus with other skin ulcer I89.0 Lymphedema, not elsewhere classified L97.822 Non-pressure chronic ulcer of other part of left lower leg with fat layer exposed I10 Essential (primary) hypertension I50.42 Chronic combined systolic (congestive) and diastolic (congestive) heart failure N18.30 Chronic kidney disease, stage 3 unspecified Follow-up Appointments Return Appointment in 1 week. Bathing/ Shower/ Hygiene May shower with wound dressing protected with water repellent cover or cast protector. Anesthetic (Use 'Patient Medications' Section for Anesthetic Order Entry) Lidocaine applied to wound bed Edema Control - Lymphedema / Segmental Compressive Device / Other Optional: One layer of unna paste to top of compression wrap (to act as an anchor). Elevate, Exercise Daily and A  void Standing for Long Periods of Time. Elevate legs to the level of the heart and pump ankles as often as possible Elevate leg(s) parallel to the floor when sitting. Wound Treatment CHISOM, MUNTEAN ([id number]) [id number] Page 5 of 10 Wound #1 - Lower Leg Wound Laterality: Left Cleanser: Soap and Water 1 x Per Week/30 Days Discharge Instructions: Gently cleanse wound with antibacterial soap, rinse and pat dry prior to dressing wounds Prim Dressing: Hydrofera Blue Ready Transfer Foam, 2.5x2.5 (in/in) 1 x Per Week/30 Days ary Discharge Instructions: Apply Hydrofera Blue Ready to wound bed as directed Secondary Dressing: ABD Pad 5x9 (in/in) 1 x Per Week/30 Days Discharge Instructions: Cover with ABD pad Secondary Dressing: Gauze 1 x Per Week/30 Days Discharge Instructions: As directed: dry, moistened with saline or moistened with Dakins Solution Compression Wrap: 3-LAYER WRAP - Profore Lite LF 3 Multilayer Compression Bandaging System 1 x Per Week/30 Days Discharge Instructions: Apply 3 multi-layer wrap as prescribed. Wound #2 - Lower Leg Wound Laterality: Left, Lateral Cleanser: Soap and Water 1 x Per Week/30 Days Discharge Instructions: Gently cleanse wound with antibacterial soap, rinse and pat dry prior to dressing wounds Prim Dressing: Hydrofera Blue Ready Transfer Foam, 2.5x2.5 (in/in) 1 x Per Week/30 Days ary Discharge Instructions: Apply Hydrofera Blue Ready to wound bed as directed Secondary Dressing: ABD Pad 5x9 (in/in) 1 x Per Week/30 Days Discharge Instructions: Cover with ABD pad Secondary Dressing: Gauze 1 x Per Week/30 Days Discharge Instructions: As directed: dry, moistened with saline or moistened with Dakins Solution Compression Wrap: 3-LAYER WRAP - Profore Lite LF 3 Multilayer Compression Bandaging System 1 x Per Week/30 Days Discharge Instructions: Apply 3 multi-layer wrap as prescribed. Electronic Signature(s) Signed: 07/12/2022 1:28:04 PM  By: Worthy Keeler PA-C Signed: 07/17/2022 4:29:22 PM By: Wanda Coria RN Entered By: [name] on 07/12/2022 12:08:55 -------------------------------------------------------------------------------- Problem List Details Patient Name: Date of Service: [facility] [place], [name] 07/12/2022 11:30 A M Medical Record Number: [id number] Patient Account Number: [id number] Date of Birth/Sex: Treating RN: 07/11/1940 (82 y.o. 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 07/12/2022 11:30 A M Medical Record Number: [id number] Patient Account Number: [id number] Date of Birth/Sex: Treating RN: 17-Aug-1940 (82 y.o. [name] Primary Care Provider: Tracie [name] Other Clinician: Referring Provider: Treating Provider/Extender: [name] in Treatment: 0 Subjective Chief Complaint Information obtained from  Patient Left LE Ulcers History of Present Illness (HPI) 07-12-2022 upon evaluation today patient presents for initial inspection concerning issues she has been having with wounds on her left lower extremity. She thinks she may have hit the lateral portion of her leg on the dishwasher door but the other she thinks may have been a tape injury. It is more of a blister type situation. With that being said she tells me that she is having some discomfort although is not terrible at this point. Fortunately I do not see any signs of infection locally or systemically at this time which is great news. No fevers, chills, nausea, vomiting, or diarrhea. Patient does have a history of diabetes mellitus type 2, lymphedema for which she is not really able to wear compression socks that she tells me she cannot get them on. She also has hypertension, congestive heart failure, and chronic kidney disease stage III. Patient History Allergies Shingrix (PF), oxybutynin, zoster vaccine live, Crestor, WelChol, colesevelam Social History Never smoker, Marital Status - Widowed, Alcohol Use - Rarely, Drug Use - No History, Caffeine Use - Daily. Medical History Hematologic/Lymphatic Patient has history of Lymphedema Cardiovascular Patient has history of Congestive Heart Failure, Hypertension Endocrine Patient has history of Type II Diabetes [name], [name] ([id number]) [phone].pdf Page 7 of 10 Patient is treated with Controlled Diet. Review of Systems (ROS) Genitourinary ckd 3 Integumentary (Skin) Complains or has symptoms of Wounds. Objective Constitutional patient is hypertensive.. pulse regular and within target range for patient.Marland Kitchen respirations regular, non-labored and within target range for patient.Marland Kitchen temperature within target range for patient.. Obese and well-hydrated in no acute distress. Vitals Time Taken: 11:19 AM, Height: 69 in, Source: Stated, Weight: 250 lbs, Source: Stated,  BMI: 36.9, Temperature: 98.1 F, Pulse: 74 bpm, Respiratory Rate: 16 breaths/min, Blood Pressure: 181/77 mmHg. Eyes conjunctiva clear no eyelid edema noted. pupils equal round and reactive to light and accommodation. Ears, Nose, Mouth, and Throat no gross abnormality of ear auricles or external auditory canals. normal hearing noted during conversation. mucus membranes moist. Respiratory normal breathing without difficulty. Cardiovascular 1+ dorsalis pedis/posterior tibialis pulses. 2+ pitting edema of the bilateral lower extremities. Musculoskeletal normal gait and posture. no significant deformity or arthritic changes, no loss or range of motion, no clubbing. Psychiatric this patient is able to make decisions and demonstrates good insight into disease process. Alert and Oriented x 3. pleasant and cooperative. General Notes: Upon inspection patient's wounds actually are going to require some sharp debridement to clearway some of the necrotic debris. I discussed that with her today and explained the benefits and risk of any debridement as well. She is in agreement with proceeding. Her blood pressure is elevated though she tells me she did not take her medication today. She is also been somewhat stressed due to the fact that she had a difficult time finding our office which is completely understandable. Nonetheless this is something that if it is maintaining high all the time she needs to be seen by her primary care  provider ASAP in order to discuss this and determine what the best steps are going forward to get her blood pressure under good control. Integumentary (Hair, Skin) Wound #1 status is Open. Original cause of wound was Gradually Appeared. The date acquired was: 05/01/2022. The wound is located on the Left,Anterior Lower Leg. The wound measures 3.5cm length x 3cm width x 0.1cm depth; 8.247cm^2 area and 0.825cm^3 volume. There is no tunneling or undermining noted. There is a medium amount  of serosanguineous drainage noted. There is no granulation within the wound bed. There is a large (67-100%) amount of necrotic tissue within the wound bed including Eschar and Adherent Slough. Wound #2 status is Open. Original cause of wound was Gradually Appeared. The date acquired was: 05/01/2022. The wound is located on the Left,Lateral Lower Leg. The wound measures 2cm length x 1.8cm width x 0.2cm depth; 2.827cm^2 area and 0.565cm^3 volume. There is no tunneling or undermining noted. There is a medium amount of serosanguineous drainage noted. There is no granulation within the wound bed. There is a large (67-100%) amount of necrotic tissue within the wound bed including Adherent Slough. Assessment Active Problems ICD-10 Type 2 diabetes mellitus with other skin ulcer Lymphedema, not elsewhere classified Non-pressure chronic ulcer of other part of left lower leg with fat layer exposed Essential (primary) hypertension Chronic combined systolic (congestive) and diastolic (congestive) heart failure Chronic kidney disease, stage 3 unspecified Procedures Wound #1 Pre-procedure diagnosis of Wound #1 is a Diabetic Wound/Ulcer of the Lower Extremity located on the Left,Anterior Lower Leg .Severity of Tissue Pre Debridement is: Fat layer exposed. There was a Excisional Skin/Subcutaneous Tissue Debridement with a total area of 10.5 sq cm performed by Lyndal Pulley, Faithlynn [name] ([id number]) [id number] Page 8 of 10 E., PA-C. With the following instrument(s): Curette to remove Viable and Non-Viable tissue/material. Material removed includes Subcutaneous Tissue and Slough and. No specimens were taken. A time out was conducted at 12:05, prior to the start of the procedure. A Minimum amount of bleeding was controlled with Pressure. The procedure was tolerated well with a pain level of 0 throughout and a pain level of 0 following the procedure. Post Debridement Measurements:  3.5cm length x 3cm width x 0.1cm depth; 0.825cm^3 volume. Character of Wound/Ulcer Post Debridement is improved. Severity of Tissue Post Debridement is: Fat layer exposed. Post procedure Diagnosis Wound #1: Same as Pre-Procedure Wound #2 Pre-procedure diagnosis of Wound #2 is a Diabetic Wound/Ulcer of the Lower Extremity located on the Left,Lateral Lower Leg .Severity of Tissue Pre Debridement is: Fat layer exposed. There was a Excisional Skin/Subcutaneous Tissue Debridement with a total area of 3.6 sq cm performed by Wanda Sams., PA-C. With the following instrument(s): Curette to remove Viable and Non-Viable tissue/material. Material removed includes Subcutaneous Tissue and Slough and. No specimens were taken. A time out was conducted at 12:05, prior to the start of the procedure. A Minimum amount of bleeding was controlled with Pressure. The procedure was tolerated well with a pain level of 0 throughout and a pain level of 0 following the procedure. Post Debridement Measurements: 2cm length x 1.8cm width x 0.2cm depth; 0.565cm^3 volume. Character of Wound/Ulcer Post Debridement is improved. Severity of Tissue Post Debridement is: Fat layer exposed. Post procedure Diagnosis Wound #2: Same as Pre-Procedure Plan Follow-up Appointments: Return Appointment in 1 week. Bathing/ Shower/ Hygiene: May shower with wound dressing protected with water repellent cover or cast protector. Anesthetic (Use 'Patient Medications' Section for Anesthetic Order Entry): Lidocaine applied to wound  bed Edema Control - Lymphedema / Segmental Compressive Device / Other: Optional: One layer of unna paste to top of compression wrap (to act as an anchor). Elevate, Exercise Daily and Avoid Standing for Long Periods of Time. Elevate legs to the level of the heart and pump ankles as often as possible Elevate leg(s) parallel to the floor when sitting. WOUND #1: - Lower Leg Wound Laterality: Left Cleanser: Soap and Water  1 x Per Week/30 Days Discharge Instructions: Gently cleanse wound with antibacterial soap, rinse and pat dry prior to dressing wounds Prim Dressing: Hydrofera Blue Ready Transfer Foam, 2.5x2.5 (in/in) 1 x Per Week/30 Days ary Discharge Instructions: Apply Hydrofera Blue Ready to wound bed as directed Secondary Dressing: ABD Pad 5x9 (in/in) 1 x Per Week/30 Days Discharge Instructions: Cover with ABD pad Secondary Dressing: Gauze 1 x Per Week/30 Days Discharge Instructions: As directed: dry, moistened with saline or moistened with Dakins Solution Com pression Wrap: 3-LAYER WRAP - Profore Lite LF 3 Multilayer Compression Bandaging System 1 x Per Week/30 Days Discharge Instructions: Apply 3 multi-layer wrap as prescribed. WOUND #2: - Lower Leg Wound Laterality: Left, Lateral Cleanser: Soap and Water 1 x Per Week/30 Days Discharge Instructions: Gently cleanse wound with antibacterial soap, rinse and pat dry prior to dressing wounds Prim Dressing: Hydrofera Blue Ready Transfer Foam, 2.5x2.5 (in/in) 1 x Per Week/30 Days ary Discharge Instructions: Apply Hydrofera Blue Ready to wound bed as directed Secondary Dressing: ABD Pad 5x9 (in/in) 1 x Per Week/30 Days Discharge Instructions: Cover with ABD pad Secondary Dressing: Gauze 1 x Per Week/30 Days Discharge Instructions: As directed: dry, moistened with saline or moistened with Dakins Solution Com pression Wrap: 3-LAYER WRAP - Profore Lite LF 3 Multilayer Compression Bandaging System 1 x Per Week/30 Days Discharge Instructions: Apply 3 multi-layer wrap as prescribed. 1. I would recommend currently that we go ahead and initiate treatment with a Hydrofera Blue dressing which I think is good to be the best way to go currently. 2. I am also can recommend that the patient should continue to monitor for any signs of infection or worsening. Obviously if anything changes she knows contact the office and let me know as well. 3. I would also recommend that  the patient should attempt to elevate her legs is much as possible. Will go utilizing a 3 layer compression wrap but nonetheless sometimes this still is not alone good to be effective she needs to still be doing the elevation as well. 4. Will probably look towards getting her juxta lite compression wrap next week we will get measurements and see about sending that in if that something that she is interested in doing. We will see patient back for reevaluation in 1 week here in the clinic. If anything worsens or changes patient will contact our office for additional recommendations. Electronic Signature(s) Signed: 07/12/2022 1:08:35 PM By: Worthy Keeler PA-C Entered By: Worthy [name] on 07/12/2022 13:08:34 [name], Wanda Memo ([id number]) [id number] Page 9 of 10 -------------------------------------------------------------------------------- ROS/PFSH Details Patient Name: Date of Service: [place], [name] 07/12/2022 11:30 A M Medical Record Number: [id number] Patient Account Number: [id number] Date of Birth/Sex: Treating RN: [birth year] (82 y.o. [name] Primary Care Provider: Tracie [name] Other Clinician: Referring Provider: Treating Provider/Extender: [name] in Treatment: 0 Integumentary (Skin) Complaints and Symptoms: Positive for: Wounds Hematologic/Lymphatic Medical History: Positive for: Lymphedema Cardiovascular Medical History: Positive for: Congestive Heart Failure; Hypertension Endocrine Medical History: Positive for: Type II Diabetes Time with  diabetes: 3 years Treated with: Diet Genitourinary Complaints and Symptoms: Review of System Notes: ckd 3 Immunizations Pneumococcal Vaccine: Received Pneumococcal Vaccination: Yes Received Pneumococcal Vaccination On or After 60th Birthday: Yes Implantable Devices None Family and Social History Never smoker; Marital Status - Widowed; Alcohol Use: Rarely;  Drug Use: No History; Caffeine Use: Daily Electronic Signature(s) Signed: 07/12/2022 1:28:04 PM By: Worthy Keeler PA-C Signed: 07/17/2022 4:29:22 PM By: Wanda Coria RN Entered By: [name] on 07/12/2022 11:24:48 [name], Wanda Memo ([id number]) [id number] Page 10 of 10 -------------------------------------------------------------------------------- SuperBill Details Patient Name: Date of Service: [place], [name] 07/12/2022 Medical Record Number: [id number] Patient Account Number: [id number] Date of Birth/Sex: Treating RN: [birth year] (82 y.o. 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, stage 3 unspecified Facility Procedures : CPT4 Code: [id number] Description: [place] VISIT-LEV 3 EST PT Modifier: Quantity: 1 : CPT4 Code: [id number] Description: 10272 - DEB SUBQ TISSUE 20 SQ CM/< ICD-10 Diagnosis Description L97.822 Non-pressure chronic ulcer of other part of left lower leg with fat layer expos Modifier: ed Quantity: 1 Physician Procedures : CPT4 Code Description Modifier [id number] - WC PHYS SUBQ TISS 20 SQ CM ICD-10 Diagnosis Description L97.822 Non-pressure chronic ulcer of other part of left lower leg with fat layer exposed Quantity: 1 Electronic Signature(s) Signed: 07/12/2022 1:09:08 PM By: Worthy Keeler PA-C Entered By: Worthy [name] on 07/12/2022 13:09:07

## 2022-07-17 NOTE — Progress Notes (Signed)
OZELL, FERRERA ([id number]) [id number] Page 1 of 10 Visit Report for 07/12/2022 Allergy List Details Patient Name: Date of Service: [name], [name] 07/12/2022 11:30 Wanda M Medical Record Number: [id number] Patient Account Number: [id number] Date of Birth/Sex: Treating RN: 28-Jan-1941 (82 y.o. [name] Primary Care Malaika Arnall: Tracie Harrier Other Clinician: Referring Grigor Lipschutz: Treating Jedediah Noda/Extender: Brent Bulla in Treatment: 0 Allergies Active Allergies Shingrix (PF) oxybutynin zoster vaccine live Crestor WelChol colesevelam Allergy Notes Electronic Signature(s) Signed: 07/17/2022 4:29:22 PM By: Carlene Coria RN Entered By: Carlene Coria on 07/12/2022 11:21:51 -------------------------------------------------------------------------------- Arrival Information Details Patient Name: Date of Service: [name], [name] 07/12/2022 11:30 Wanda M Medical Record Number: [id number] Patient Account Number: [id number] Date of Birth/Sex: Treating RN: 1940/09/09 (82 y.o. [name] Primary Care Darrell Hauk: Tracie Harrier Other Clinician: Referring Lexx Monte: Treating Gazelle Towe/Extender: Brent Bulla in Treatment: 0 Visit Information Patient Arrived: Ambulatory Arrival Time: 11:12 Accompanied By: self Transfer Assistance: None [name], [name] ([id number]) [id number] Page 2 of 10 Patient Identification Verified: Yes Secondary Verification Process Completed: Yes Patient Requires Transmission-Based Precautions: No Patient Has Alerts: Yes Patient Alerts: 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 08/14/21 R 1.04 TBI .96 08/14/21 Electronic Signature(s) Signed: 07/17/2022 4:29:22 PM By: Carlene Coria RN Entered By: Carlene Coria on 07/12/2022 11:17:47 -------------------------------------------------------------------------------- Clinic Level of Care Assessment Details Patient Name: Date of Service: [name], [name] 07/12/2022 11:30 Wanda M Medical Record Number: [id number] Patient Account Number: [id number] Date of Birth/Sex: Treating RN: [birth year] (82 y.o. [name] Primary Care Alphus Zeck: Tracie Harrier Other Clinician: Referring Nini Cavan: Treating Raylin Winer/Extender: Brent Bulla in Treatment: 0 Clinic Level of Care Assessment Items TOOL 1 Quantity Score X- 1 0 Use when EandM and Procedure is performed on INITIAL visit ASSESSMENTS - Nursing Assessment / Reassessment X- 1 20 General Physical Exam (combine w/ comprehensive assessment (listed just below) when performed on new pt. evals) X- 1 25 Comprehensive Assessment (HX, ROS, Risk Assessments, Wounds Hx, etc.) ASSESSMENTS - Wound and Skin Assessment / Reassessment '[]'$  - 0 Dermatologic / Skin Assessment (not related to wound area) ASSESSMENTS - Ostomy and/or Continence Assessment and Care '[]'$  - 0 Incontinence Assessment and Management '[]'$  - 0 Ostomy Care Assessment and Management (repouching, etc.) PROCESS - Coordination of Care X - Simple Patient / Family Education for ongoing care 1 15 '[]'$  - 0 Complex (extensive) Patient / Family Education for ongoing care X- 1 10 Staff obtains Programmer, systems, Records, T Results / Process Orders est '[]'$  - 0 Staff telephones HHA, Nursing Homes / Clarify orders / etc '[]'$  - 0 Routine Transfer to another Facility (non-emergent condition) '[]'$  - 0 Routine Hospital Admission (non-emergent condition) X- 1 15 New Admissions / Biomedical engineer / Ordering NPWT Apligraf, etc. , '[]'$  - 0 Emergency Hospital Admission (emergent condition) PROCESS - Special Needs '[]'$  - 0 Pediatric / Minor Patient Management '[]'$  - 0 Isolation Patient Management '[]'$  - 0 Hearing / Language / Visual special needs '[]'$  - 0 Assessment of Community assistance (transportation, D/C planning, etc.) '[]'$  - 0 Additional assistance / Altered mentation [name], [name] ([id number])  [phone].pdf Page 3 of 10 '[]'$  - 0 Support Surface(s) Assessment (bed, cushion, seat, etc.) INTERVENTIONS - Miscellaneous '[]'$  - 0 External ear exam '[]'$  - 0 Patient Transfer (multiple staff / Civil Service fast streamer / Similar devices) '[]'$  - 0 Simple Staple / Suture removal (25 or less) '[]'$  - 0 Complex Staple / Suture removal (26 or more) '[]'$  - 0 Hypo/Hyperglycemic  Management (do not check if billed separately) '[]'$  - 0 Ankle / Brachial Index (ABI) - do not check if billed separately Has the patient been seen at the hospital within the last three years: Yes Total Score: 85 Level Of Care: New/Established - Level 3 Electronic Signature(s) Signed: 07/17/2022 4:29:22 PM By: Carlene Coria RN Entered By: Carlene Coria on 07/12/2022 12:09:19 -------------------------------------------------------------------------------- Encounter Discharge Information Details Patient Name: Date of Service: [name], [name] 07/12/2022 11:30 Wanda M Medical Record Number: [id number] Patient Account Number: [id number] Date of Birth/Sex: Treating RN: January 27, 1941 (82 y.o. [name] Primary Care Vinia Jemmott: Tracie Harrier Other Clinician: Referring Ronesha Heenan: Treating Faelynn Wynder/Extender: Brent Bulla in Treatment: 0 Encounter Discharge Information Items Post Procedure Vitals Discharge Condition: Stable Temperature (F): 98.1 Ambulatory Status: Ambulatory Pulse (bpm): 74 Discharge Destination: Home Respiratory Rate (breaths/min): 16 Transportation: Private Auto Blood Pressure (mmHg): 181/77 Accompanied By: self Schedule Follow-up Appointment: Yes Clinical Summary of Care: Electronic Signature(s) Signed: 07/17/2022 4:29:22 PM By: Carlene Coria RN Entered By: Carlene Coria on 07/12/2022 12:10:09 -------------------------------------------------------------------------------- Lower Extremity Assessment Details Patient Name: Date of Service: [name], [name] 07/12/2022 11:30 Wanda  [name], [name] [phone].pdf Page 4 of 10 Medical Record Number: [id number] Patient Account Number: [id number] Date of Birth/Sex: Treating RN: Aug 01, 1940 (82 y.o. [name] Primary Care Amun Stemm: Tracie Harrier Other Clinician: Referring Clever Geraldo: Treating Marjani Kobel/Extender: Brent Bulla in Treatment: 0 Edema Assessment Assessed: [Left: No] [Right: No] [Left: Edema] [Right: :] Calf Left: Right: Point of Measurement: 34 cm From Medial Instep 41 cm Ankle Left: Right: Point of Measurement: 12 cm From Medial Instep 26 cm Knee To Floor Left: Right: From Medial Instep 46 cm Vascular Assessment Pulses: Dorsalis Pedis Palpable: [Left:Yes] Electronic Signature(s) Signed: 07/17/2022 4:29:22 PM By: Carlene Coria RN Entered By: Carlene Coria on 07/12/2022 11:37:38 -------------------------------------------------------------------------------- Multi Wound Chart Details Patient Name: Date of Service: [name], [name] 07/12/2022 11:30 Wanda M Medical Record Number: [id number] Patient Account Number: [id number] Date of Birth/Sex: Treating RN: 28-Feb-1941 (82 y.o. [name] Primary Care Kyshaun Barnette: Tracie Harrier Other Clinician: Referring Neeta Storey: Treating Antjuan Rothe/Extender: Brent Bulla in Treatment: 0 Vital Signs Height(in): 69 Pulse(bpm): 74 Weight(lbs): 250 Blood Pressure(mmHg): 181/77 Body Mass Index(BMI): 36.9 Temperature(F): 98.1 Respiratory Rate(breaths/min): 16 [1:Photos:] [N/Wanda:N/Wanda [id number] Page 5 of 10] Left Lower Leg Left, Lateral Lower Leg N/Wanda Wound Location: Gradually Appeared Gradually Appeared N/Wanda Wounding Event: Diabetic Wound/Ulcer of the Lower Diabetic Wound/Ulcer of the Lower N/Wanda Primary Etiology: Extremity Extremity Lymphedema, Congestive Heart Lymphedema, Congestive Heart N/Wanda Comorbid History: Failure, Hypertension, Type II Failure,  Hypertension, Type II Diabetes Diabetes 05/01/2022 05/01/2022 N/Wanda Date Acquired: 0 0 N/Wanda Weeks of Treatment: Open Open N/Wanda Wound Status: No No N/Wanda Wound Recurrence: 3.5x3x0.1 2x1.8x0.2 N/Wanda Measurements L x W x D (cm) 8.247 2.827 N/Wanda Wanda (cm) : rea 0.825 0.565 N/Wanda Volume (cm) : Grade 2 Grade 2 N/Wanda Classification: Medium Medium N/Wanda Exudate Wanda mount: Serosanguineous Serosanguineous N/Wanda Exudate Type: red, brown red, brown N/Wanda Exudate Color: None Present (0%) None Present (0%) N/Wanda Granulation Wanda mount: Large (67-100%) Large (67-100%) N/Wanda Necrotic Wanda mount: Eschar, Adherent Slough Adherent Slough N/Wanda Necrotic Tissue: Fascia: No Fascia: No N/Wanda Exposed Structures: Fat Layer (Subcutaneous Tissue): No Fat Layer (Subcutaneous Tissue): No Tendon: No Tendon: No Muscle: No Muscle: No Joint: No Joint: No Bone: No Bone: No None None N/Wanda Epithelialization: Treatment Notes Electronic Signature(s) Signed: 07/17/2022 4:29:22 PM By: Carlene Coria RN Entered By:  Carlene Coria on 07/12/2022 11:41:40 -------------------------------------------------------------------------------- Multi-Disciplinary Care Plan Details Patient Name: Date of Service: [place], [name] 07/12/2022 11:30 Wanda M Medical Record Number: [id number] Patient Account Number: [id number] Date of Birth/Sex: Treating RN: 1941-05-05 (82 y.o. [name] Primary Care Sylvio Weatherall: Tracie Harrier Other Clinician: Referring Noreene Boreman: Treating Lowen Barringer/Extender: Brent Bulla in Treatment: 0 Active Inactive Necrotic Tissue Nursing Diagnoses: Knowledge deficit related to management of necrotic/devitalized tissue Goals: Patient/caregiver will verbalize understanding of reason and process for debridement of necrotic tissue Date Initiated: 07/12/2022 Target Resolution Date: 08/12/2022 Goal Status: Active Interventions: Assess patient pain level pre-, during and post procedure and prior to  discharge Provide education on necrotic tissue and debridement process Notes: [name], [name] ([id number]) [phone].pdf Page 6 of 10 Wound/Skin Impairment Nursing Diagnoses: Knowledge deficit related to ulceration/compromised skin integrity Goals: Patient/caregiver will verbalize understanding of skin care regimen Date Initiated: 07/12/2022 Target Resolution Date: 08/12/2022 Goal Status: Active Ulcer/skin breakdown will have Wanda volume reduction of 30% by week 4 Date Initiated: 07/12/2022 Target Resolution Date: 08/12/2022 Goal Status: Active Ulcer/skin breakdown will have Wanda volume reduction of 50% by week 8 Date Initiated: 07/12/2022 Target Resolution Date: 09/10/2022 Goal Status: Active Ulcer/skin breakdown will have Wanda volume reduction of 80% by week 12 Date Initiated: 07/12/2022 Target Resolution Date: 10/11/2022 Goal Status: Active Ulcer/skin breakdown will heal within 14 weeks Date Initiated: 07/12/2022 Target Resolution Date: 11/10/2022 Goal Status: Active Interventions: Assess patient/caregiver ability to obtain necessary supplies Assess patient/caregiver ability to perform ulcer/skin care regimen upon admission and as needed Assess ulceration(s) every visit Notes: Electronic Signature(s) Signed: 07/17/2022 4:29:22 PM By: Carlene Coria RN Entered By: Carlene Coria on 07/12/2022 11:40:04 -------------------------------------------------------------------------------- Pain Assessment Details Patient Name: Date of Service: [place], [name] 07/12/2022 11:30 Wanda M Medical Record Number: [id number] Patient Account Number: [id number] Date of Birth/Sex: Treating RN: Aug 28, 1940 (82 y.o. [name] Primary Care Sumayah Bearse: Tracie Harrier Other Clinician: Referring Malyna Budney: Treating Tattianna Schnarr/Extender: Brent Bulla in Treatment: 0 Active Problems Location of Pain Severity and Description of Pain Patient Has Paino No Site  Locations [name], [name] ([id number]) [phone].pdf Page 7 of 10 Pain Management and Medication Current Pain Management: Electronic Signature(s) Signed: 07/17/2022 4:29:22 PM By: Carlene Coria RN Entered By: Carlene Coria on 07/12/2022 11:19:15 -------------------------------------------------------------------------------- Patient/Caregiver Education Details Patient Name: Date of Service: [facility] [place], [name] 1/12/2024andnbsp11:30 Wanda M Medical Record Number: [id number] Patient Account Number: [id number] Date of Birth/Gender: Treating RN: December 31, 1940 (82 y.o. [name] Primary Care Physician: Tracie Harrier Other Clinician: Referring Physician: Treating Physician/Extender: Brent Bulla in Treatment: 0 Education Assessment Education Provided To: Patient Education Topics Provided Welcome T [facility]New [facility] Packet: o Methods: Explain/Verbal Responses: State content correctly Electronic Signature(s) Signed: 07/17/2022 4:29:22 PM By: Carlene Coria RN Entered By: Carlene Coria on 07/12/2022 11:37:56 [name], [name] ([id number]) [id number] Page 8 of 10 -------------------------------------------------------------------------------- Wound Assessment Details Patient Name: Date of Service: [place], [name] 07/12/2022 11:30 Wanda M Medical Record Number: [id number] Patient Account Number: [id number] Date of Birth/Sex: Treating RN: Oct 01, 1940 (82 y.o. [name] Primary Care Jayde Daffin: Tracie Harrier Other Clinician: Referring Kemoni Ortega: Treating Marvella Jenning/Extender: Brent Bulla in Treatment: 0 Wound Status Wound Number: 1 Primary Diabetic Wound/Ulcer of the Lower Extremity Etiology: Wound Location: Left Lower Leg Wound Status: Open Wounding Event: Gradually Appeared Comorbid Lymphedema, Congestive Heart Failure, Hypertension, Type Date Acquired: 05/01/2022 History: II  Diabetes Weeks Of Treatment: 0 Clustered  Wound: No Photos Wound Measurements Length: (cm) 3.5 Width: (cm) 3 Depth: (cm) 0.1 Area: (cm) 8.247 Volume: (cm) 0.825 % Reduction in Area: % Reduction in Volume: Epithelialization: None Tunneling: No Undermining: No Wound Description Classification: Grade 2 Exudate Amount: Medium Exudate Type: Serosanguineous Exudate Color: red, brown Foul Odor After Cleansing: No Slough/Fibrino Yes Wound Bed Granulation Amount: None Present (0%) Exposed Structure Necrotic Amount: Large (67-100%) Fascia Exposed: No Necrotic Quality: Eschar, Adherent Slough Fat Layer (Subcutaneous Tissue) Exposed: No Tendon Exposed: No Muscle Exposed: No Joint Exposed: No Bone Exposed: No Electronic Signature(s) Signed: 07/17/2022 4:29:22 PM By: Carlene Coria RN Entered By: Carlene Coria on 07/12/2022 11:36:05 [name], [name] ([id number]) [id number] Page 9 of 10 -------------------------------------------------------------------------------- Wound Assessment Details Patient Name: Date of Service: [place], [name] 07/12/2022 11:30 Wanda M Medical Record Number: [id number] Patient Account Number: [id number] Date of Birth/Sex: Treating RN: May 02, 1941 (82 y.o. [name] Primary Care Camesha Farooq: Tracie Harrier Other Clinician: Referring Rilley Stash: Treating Dana Dorner/Extender: Brent Bulla in Treatment: 0 Wound Status Wound Number: 2 Primary Diabetic Wound/Ulcer of the Lower Extremity Etiology: Wound Location: Left, Lateral Lower Leg Wound Status: Open Wounding Event: Gradually Appeared Comorbid Lymphedema, Congestive Heart Failure, Hypertension, Type Date Acquired: 05/01/2022 History: II Diabetes Weeks Of Treatment: 0 Clustered Wound: No Photos Wound Measurements Length: (cm) 2 Width: (cm) 1.8 Depth: (cm) 0.2 Area: (cm) 2.827 Volume: (cm) 0.565 % Reduction in Area: % Reduction in  Volume: Epithelialization: None Tunneling: No Undermining: No Wound Description Classification: Grade 2 Exudate Amount: Medium Exudate Type: Serosanguineous Exudate Color: red, brown Foul Odor After Cleansing: No Slough/Fibrino Yes Wound Bed Granulation Amount: None Present (0%) Exposed Structure Necrotic Amount: Large (67-100%) Fascia Exposed: No Necrotic Quality: Adherent Slough Fat Layer (Subcutaneous Tissue) Exposed: No Tendon Exposed: No Muscle Exposed: No Joint Exposed: No Bone Exposed: No Treatment Notes Wound #2 (Lower Leg) Wound Laterality: Left, Lateral Cleanser Soap and Water Discharge Instruction: Gently cleanse wound with antibacterial soap, rinse and pat dry prior to dressing wounds Peri-Wound Care JAMIRACLE, AVANTS [name] ([id number]) [phone].pdf Page 10 of 10 Topical Primary Dressing Hydrofera Blue Ready Transfer Foam, 2.5x2.5 (in/in) Discharge Instruction: Apply Hydrofera Blue Ready to wound bed as directed Secondary Dressing ABD Pad 5x9 (in/in) Discharge Instruction: Cover with ABD pad Gauze Discharge Instruction: As directed: dry, moistened with saline or moistened with Dakins Solution Secured With Compression Wrap 3-LAYER WRAP - Profore Lite LF 3 Multilayer Compression Bandaging System Discharge Instruction: Apply 3 multi-layer wrap as prescribed. Compression Stockings Add-Ons Electronic Signature(s) Signed: 07/17/2022 4:29:22 PM By: Carlene Coria RN Entered By: Carlene Coria on 07/12/2022 11:41:04 -------------------------------------------------------------------------------- Vitals Details Patient Name: Date of Service: [name], Wanda UDREY [name]. 07/12/2022 11:30 Wanda M Medical Record Number: [id number] Patient Account Number: [id number] Date of Birth/Sex: Treating RN: 09-Nov-1940 (82 y.o. [name] Primary Care Yarrow Linhart: Tracie Harrier Other Clinician: Referring Orit Sanville: Treating Ryne Mctigue/Extender: Brent Bulla in Treatment: 0 Vital Signs Time Taken: 11:19 Temperature (F): 98.1 Height (in): 69 Pulse (bpm): 74 Source: Stated Respiratory Rate (breaths/min): 16 Weight (lbs): 250 Blood Pressure (mmHg): 181/77 Source: Stated Reference Range: 80 - 120 mg / dl Body Mass Index (BMI): 36.9 Electronic Signature(s) Signed: 07/17/2022 4:29:22 PM By: Carlene Coria RN Entered By: Carlene Coria on 07/12/2022 11:20:13

## 2022-07-18 DIAGNOSIS — E11622 Type 2 diabetes mellitus with other skin ulcer: Secondary | ICD-10-CM | POA: Diagnosis not present

## 2022-07-19 NOTE — Progress Notes (Signed)
ARLENNE, KIMBLEY ([id number]) [id number] Page 1 of 6 Visit Report for 07/18/2022 Arrival Information Details Patient Name: Date of Service: [place], [name] 07/18/2022 10:45 A M Medical Record Number: [id number] Patient Account Number: [id number] Date of Birth/Sex: Treating RN: [birth year] (82 y.o. Marlowe Shores Primary Care Marco Raper: Tracie Harrier Other Clinician: Massie Kluver Referring Adisson Deak: Treating Jakory Matsuo/Extender: Solon Palm Weeks in Treatment: 0 Visit Information History Since Last Visit All ordered tests and consults were completed: No Patient Arrived: Ambulatory Added or deleted any medications: No Arrival Time: 10:51 Any new allergies or adverse reactions: No Transfer Assistance: None Had a fall or experienced change in No Patient Identification Verified: Yes activities of daily living that may affect Secondary Verification Process Completed: Yes risk of falls: Patient Requires Transmission-Based Precautions: No Signs or symptoms of abuse/neglect since last visito No Patient Has Alerts: Yes Hospitalized since last visit: No Patient Alerts: 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 08/14/21 Implantable device outside of the clinic excluding No R 1.04 TBI .96 08/14/21 cellular tissue based products placed in the center since last visit: Has Dressing in Place as Prescribed: Yes Has Compression in Place as Prescribed: Yes Pain Present Now: Yes Electronic Signature(s) Signed: 07/19/2022 9:50:53 AM By: Massie Kluver Entered By: Massie Kluver on 07/18/2022 10:55:02 -------------------------------------------------------------------------------- Clinic Level of Care Assessment Details Patient Name: Date of Service: [place], [name] 07/18/2022 10:45 A M Medical Record Number: [id number] Patient Account Number: [id number] Date of Birth/Sex: Treating RN: 1940/12/31 (82 y.o. Marlowe Shores Primary Care Reghan Thul: Tracie Harrier Other  Clinician: Massie Kluver Referring Melrose Kearse: Treating Sharmeka Palmisano/Extender: Solon Palm Weeks in Treatment: 0 Clinic Level of Care Assessment Items TOOL 1 Quantity Score '[]'$  - 0 Use when EandM and Procedure is performed on INITIAL visit ASSESSMENTS - Nursing Assessment / Reassessment '[]'$  - 0 General Physical Exam (combine w/ comprehensive assessment (listed just below) when performed on new pt. evals) Boster, Sumeya P ([id number]) [id number] Page 2 of 6 '[]'$  - 0 Comprehensive Assessment (HX, ROS, Risk Assessments, Wounds Hx, etc.) ASSESSMENTS - Wound and Skin Assessment / Reassessment '[]'$  - 0 Dermatologic / Skin Assessment (not related to wound area) ASSESSMENTS - Ostomy and/or Continence Assessment and Care '[]'$  - 0 Incontinence Assessment and Management '[]'$  - 0 Ostomy Care Assessment and Management (repouching, etc.) PROCESS - Coordination of Care '[]'$  - 0 Simple Patient / Family Education for ongoing care '[]'$  - 0 Complex (extensive) Patient / Family Education for ongoing care '[]'$  - 0 Staff obtains Programmer, systems, Records, T Results / Process Orders est '[]'$  - 0 Staff telephones HHA, Nursing Homes / Clarify orders / etc '[]'$  - 0 Routine Transfer to another Facility (non-emergent condition) '[]'$  - 0 Routine Hospital Admission (non-emergent condition) '[]'$  - 0 New Admissions / Biomedical engineer / Ordering NPWT Apligraf, etc. , '[]'$  - 0 Emergency Hospital Admission (emergent condition) PROCESS - Special Needs '[]'$  - 0 Pediatric / Minor Patient Management '[]'$  - 0 Isolation Patient Management '[]'$  - 0 Hearing / Language / Visual special needs '[]'$  - 0 Assessment of Community assistance (transportation, D/C planning, etc.) '[]'$  - 0 Additional assistance / Altered mentation '[]'$  - 0 Support Surface(s) Assessment (bed, cushion, seat, etc.) INTERVENTIONS - Miscellaneous '[]'$  - 0 External ear exam '[]'$  - 0 Patient Transfer (multiple staff / Civil Service fast streamer /  Similar devices) '[]'$  - 0 Simple Staple / Suture removal (25 or less) '[]'$  - 0 Complex Staple / Suture removal (26 or more) '[]'$  - 0 Hypo/Hyperglycemic Management (do not  check if billed separately) '[]'$  - 0 Ankle / Brachial Index (ABI) - do not check if billed separately Has the patient been seen at the hospital within the last three years: Yes Total Score: 0 Level Of Care: ____ Electronic Signature(s) Signed: 07/19/2022 9:50:53 AM By: Massie Kluver Entered By: Massie Kluver on 07/18/2022 11:22:49 -------------------------------------------------------------------------------- Compression Therapy Details Patient Name: Date of Service: [name], [name] 07/18/2022 10:45 A M Medical Record Number: [id number] Patient Account Number: [id number] Date of Birth/Sex: Treating RN: 01/08/1941 (82 y.o. Mar[place]e Sh[place] Primary Care Lorie Cleckley: Tracie Harrier Other Clinician: Ai, Sonnenfeld ([id number]) [id number] Page 3 of 6 Referring Terrin Meddaugh: Treating Ka Bench/Extender: Nadara Mustard, Vishwanath Weeks in Treatment: 0 Compression Therapy Performed for Wound Assessment: Wound #1 Left,Anterior Lower Leg Performed By: Lenice Pressman, Angie, Compression Type: Three Layer Electronic Signature(s) Signed: 07/19/2022 9:50:53 AM By: Massie Kluver Entered By: Massie Kluver on 07/18/2022 10:56:24 -------------------------------------------------------------------------------- Encounter Discharge Information Details Patient Name: Date of Service: [name], [name] 07/18/2022 10:45 A M Medical Record Number: [id number] Patient Account Number: [id number] Date of Birth/Sex: Treating RN: 06/16/1941 (82 y.o. Marlowe Sh[place] Primary Care Sumayah Bearse: Tracie Harrier Other Clinician: Massie Kluver Referring Kiondra Caicedo: Treating Ivet Guerrieri/Extender: Solon Palm Weeks in Treatment: 0 Encounter Discharge Information Items Discharge Condition:  Stable Ambulatory Status: Ambulatory Discharge Destination: Home Transportation: Private Auto Accompanied By: self Schedule Follow-up Appointment: Yes Clinical Summary of Care: Electronic Signature(s) Signed: 07/19/2022 9:50:53 AM By: Massie Kluver Entered By: Massie Kluver on 07/18/2022 11:22:43 -------------------------------------------------------------------------------- Wound Assessment Details Patient Name: Date of Service: [name], [name] 07/18/2022 10:45 A M Medical Record Number: [id number] Patient Account Number: [id number] Date of Birth/Sex: Treating RN: [birth year] (82 y.o. [place]e [place] Primary Care Renji Berwick: Tracie Harrier Other Clinician: Massie Kluver Referring Azael Ragain: Treating Tasha Jindra/Extender: Solon Palm Weeks in Treatment: 0 Wound Status Wound Number: 1 Primary Diabetic Wound/Ulcer of the Lower Extremity Etiology: Wound Location: Left, Anterior Lower Leg Wound Status: Open Wounding Event: Gradually Appeared Comorbid Lymphedema, Congestive Heart Failure, Hypertension, Type Date Acquired: 05/01/2022 JOUD, PETTINATO ([id number]) [phone].pdf Page 4 of 6 Date Acquired: 05/01/2022 History: II Diabetes Weeks Of Treatment: 0 Clustered Wound: No Wound Measurements Length: (cm) 3.5 Width: (cm) 3 Depth: (cm) 0.1 Area: (cm) 8.247 Volume: (cm) 0.825 % Reduction in Area: 0% % Reduction in Volume: 0% Epithelialization: None Wound Description Classification: Grade 2 Exudate Amount: Medium Exudate Type: Serosanguineous Exudate Color: red, brown Foul Odor After Cleansing: No Slough/Fibrino Yes Wound Bed Granulation Amount: None Present (0%) Exposed Structure Necrotic Amount: Large (67-100%) Fascia Exposed: No Necrotic Quality: Eschar, Adherent Slough Fat Layer (Subcutaneous Tissue) Exposed: No Tendon Exposed: No Muscle Exposed: No Joint Exposed: No Bone Exposed: No Treatment Notes Wound #1 (Lower  Leg) Wound Laterality: Left, Anterior Cleanser Soap and Water Discharge Instruction: Gently cleanse wound with antibacterial soap, rinse and pat dry prior to dressing wounds Peri-Wound Care Topical Primary Dressing Hydrofera Blue Ready Transfer Foam, 2.5x2.5 (in/in) Discharge Instruction: Apply Hydrofera Blue Ready to wound bed as directed Secondary Dressing ABD Pad 5x9 (in/in) Discharge Instruction: Cover with ABD pad Gauze Discharge Instruction: As directed: dry, moistened with saline or moistened with Dakins Solution Secured With Compression Wrap 3-LAYER WRAP - Profore Lite LF 3 Multilayer Compression Bandaging System Discharge Instruction: Apply 3 multi-layer wrap as prescribed. Compression Stockings Add-Ons Electronic Signature(s) Signed: 07/18/2022 6:11:46 PM By: Gretta Cool, BSN, RN, CWS, Kim RN, BSN Signed: 07/19/2022 9:50:53 AM By: Massie Kluver Entered By: Massie Kluver on  07/18/2022 10:55:38 Orsak, Sparrow P ([id number]) [phone].pdf Page 5 of 6 -------------------------------------------------------------------------------- Wound Assessment Details Patient Name: Date of Service: [place], [name] 07/18/2022 10:45 A M Medical Record Number: [id number] Patient Account Number: [id number] Date of Birth/Sex: Treating RN: 08-19-1940 (82 y.o. Charolette Forward, Kim Primary Care Britton Bera: Tracie Harrier Other Clinician: Massie Kluver Referring Ether Goebel: Treating Takuma Cifelli/Extender: Nadara Mustard, Vishwanath Weeks in Treatment: 0 Wound Status Wound Number: 2 Primary Diabetic Wound/Ulcer of the Lower Extremity Etiology: Wound Location: Left, Lateral Lower Leg Wound Status: Open Wounding Event: Gradually Appeared Comorbid Lymphedema, Congestive Heart Failure, Hypertension, Type Date Acquired: 05/01/2022 History: II Diabetes Weeks Of Treatment: 0 Clustered Wound: No Wound Measurements Length: (cm) 2 Width: (cm) 1.8 Depth: (cm) 0.2 Area: (cm)  2.827 Volume: (cm) 0.565 % Reduction in Area: 0% % Reduction in Volume: 0% Epithelialization: None Wound Description Classification: Grade 2 Exudate Amount: Medium Exudate Type: Serosanguineous Exudate Color: red, brown Foul Odor After Cleansing: No Slough/Fibrino Yes Wound Bed Granulation Amount: None Present (0%) Exposed Structure Necrotic Amount: Large (67-100%) Fascia Exposed: No Necrotic Quality: Adherent Slough Fat Layer (Subcutaneous Tissue) Exposed: No Tendon Exposed: No Muscle Exposed: No Joint Exposed: No Bone Exposed: No Treatment Notes Wound #2 (Lower Leg) Wound Laterality: Left, Lateral Cleanser Soap and Water Discharge Instruction: Gently cleanse wound with antibacterial soap, rinse and pat dry prior to dressing wounds Peri-Wound Care Topical Primary Dressing Hydrofera Blue Ready Transfer Foam, 2.5x2.5 (in/in) Discharge Instruction: Apply Hydrofera Blue Ready to wound bed as directed Secondary Dressing ABD Pad 5x9 (in/in) Discharge Instruction: Cover with ABD pad Gauze Discharge Instruction: As directed: dry, moistened with saline or moistened with Dakins Solution Secured With [place], Derrill Memo ([id number]) [phone].pdf Page 6 of 6 Compression Wrap 3-LAYER WRAP - Profore Lite LF 3 Multilayer Compression Bandaging System Discharge Instruction: Apply 3 multi-layer wrap as prescribed. Compression Stockings Add-Ons Electronic Signature(s) Signed: 07/18/2022 6:11:46 PM By: Gretta Cool, BSN, RN, CWS, Kim RN, BSN Signed: 07/19/2022 9:50:53 AM By: Massie Kluver Entered By: Massie Kluver on 07/18/2022 10:56:06

## 2022-07-19 NOTE — Progress Notes (Signed)
Wanda, [name] ([id number]) [id number] Page 1 of 2 Visit Report for 07/18/2022 Physician Orders Details Patient Name: Date of Service: [name], [name] 07/18/2022 10:45 A M Medical Record Number: [id number] Patient Account Number: [id number] Date of Birth/Sex: Treating RN: [birth year] (82 y.o. [name] Primary Care Provider: Tracie Harrier Other Clinician: Massie Kluver Referring Provider: Treating Provider/Extender: Solon Palm Weeks in Treatment: 0 Verbal / Phone Orders: No Diagnosis Coding Follow-up Appointments Return Appointment in 1 week. Bathing/ Shower/ Hygiene May shower with wound dressing protected with water repellent cover or cast protector. Anesthetic (Use 'Patient Medications' Section for Anesthetic Order Entry) Lidocaine applied to wound bed Edema Control - Lymphedema / Segmental Compressive Device / Other Optional: One layer of unna paste to top of compression wrap (to act as an anchor). Elevate, Exercise Daily and A void Standing for Long Periods of Time. Elevate legs to the level of the heart and pump ankles as often as possible Elevate leg(s) parallel to the floor when sitting. Wound Treatment Wound #1 - Lower Leg Wound Laterality: Left, Anterior Cleanser: Soap and Water 1 x Per Week/30 Days Discharge Instructions: Gently cleanse wound with antibacterial soap, rinse and pat dry prior to dressing wounds Prim Dressing: Hydrofera Blue Ready Transfer Foam, 2.5x2.5 (in/in) 1 x Per Week/30 Days ary Discharge Instructions: Apply Hydrofera Blue Ready to wound bed as directed Secondary Dressing: ABD Pad 5x9 (in/in) 1 x Per Week/30 Days Discharge Instructions: Cover with ABD pad Secondary Dressing: Gauze 1 x Per Week/30 Days Discharge Instructions: As directed: dry, moistened with saline or moistened with Dakins Solution Compression Wrap: 3-LAYER WRAP - Profore Lite LF 3 Multilayer Compression Bandaging System 1 x  Per Week/30 Days Discharge Instructions: Apply 3 multi-layer wrap as prescribed. Wound #2 - Lower Leg Wound Laterality: Left, Lateral Cleanser: Soap and Water 1 x Per Week/30 Days Discharge Instructions: Gently cleanse wound with antibacterial soap, rinse and pat dry prior to dressing wounds Prim Dressing: Hydrofera Blue Ready Transfer Foam, 2.5x2.5 (in/in) 1 x Per Week/30 Days ary Discharge Instructions: Apply Hydrofera Blue Ready to wound bed as directed Secondary Dressing: ABD Pad 5x9 (in/in) 1 x Per Week/30 Days Discharge Instructions: Cover with ABD pad Secondary Dressing: Gauze 1 x Per Week/30 Days Discharge Instructions: As directed: dry, moistened with saline or moistened with Dakins Solution Compression Wrap: 3-LAYER WRAP - Profore Lite LF 3 Multilayer Compression Bandaging System 1 x Per Week/30 Days Discharge Instructions: Apply 3 multi-layer wrap as prescribed. Wanda, [name] ([id number]) [id number] Page 2 of 2 Electronic Signature(s) Signed: 07/18/2022 5:43:57 PM By: Worthy Keeler PA-C Signed: 07/19/2022 9:50:53 AM By: Massie Kluver Entered By: Massie Kluver on 07/18/2022 11:22:22 -------------------------------------------------------------------------------- SuperBill Details Patient Name: Date of Service: [name], [place]. 07/18/2022 Medical Record Number: [id number] Patient Account Number: [id number] Date of Birth/Sex: Treating RN: 1941-03-22 (82 y.o. 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

## 2022-07-26 ENCOUNTER — Encounter: Payer: Medicare Other | Admitting: Physician Assistant

## 2022-07-26 DIAGNOSIS — E11622 Type 2 diabetes mellitus with other skin ulcer: Secondary | ICD-10-CM | POA: Diagnosis not present

## 2022-07-26 NOTE — Progress Notes (Addendum)
MEHAR, SAGEN ([id number]) [id number] Page 1 of 10 Visit Report for 07/26/2022 Arrival Information Details Patient Name: Date of Service: [place], [name] 07/26/2022 11:30 A M Medical Record Number: [id number] Patient Account Number: [id number] Date of Birth/Sex: Treating RN: 1940-12-12 (82 y.o. [name] Primary Care Chalsea Darko: Tracie Harrier Other Clinician: Referring Cordell Coke: Treating Alyze Lauf/Extender: Solon Palm Weeks in Treatment: 2 Visit Information History Since Last Visit Added or deleted any medications: No Patient Arrived: Kasandra Knudsen Any new allergies or adverse reactions: No Arrival Time: 11:53 Had a fall or experienced change in No Accompanied By: self activities of daily living that may affect Transfer Assistance: None risk of falls: Patient Identification Verified: Yes Hospitalized since last visit: No Secondary Verification Process Completed: Yes Has Dressing in Place as Prescribed: Yes Patient Requires Transmission-Based Precautions: No Has Compression in Place as Prescribed: Yes Patient Has Alerts: Yes Pain Present Now: No Patient Alerts: 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 08/14/21 R 1.04 TBI .96 08/14/21 Electronic Signature(s) Signed: 07/26/2022 12:44:53 PM By: Rosalio Loud MSN RN CNS WTA Entered By: Rosalio Loud on 07/26/2022 11:56:28 -------------------------------------------------------------------------------- Clinic Level of Care Assessment Details Patient Name: Date of Service: [place], [name] 07/26/2022 11:30 A M Medical Record Number: [id number] Patient Account Number: [id number] Date of Birth/Sex: Treating RN: 10-Nov-1940 (82 y.o. [name] Primary Care Kharee Lesesne: Tracie Harrier Other Clinician: Referring Macarius Ruark: Treating Nafeesah Lapaglia/Extender: Solon Palm Weeks in Treatment: 2 Clinic Level of Care Assessment Items TOOL 1 Quantity Score '[]'$  - 0 Use when EandM and Procedure is performed  on INITIAL visit ASSESSMENTS - Nursing Assessment / Reassessment '[]'$  - 0 General Physical Exam (combine w/ comprehensive assessment (listed just below) when performed on new pt. evals) '[]'$  - 0 Comprehensive Assessment (HX, ROS, Risk Assessments, Wounds Hx, etc.) ASSESSMENTS - Wound and Skin Assessment / Reassessment '[]'$  - 0 Dermatologic / Skin Assessment (not related to wound area) Armato, Anjoli P ([id number]) [id number] Page 2 of 10 ASSESSMENTS - Ostomy and/or Continence Assessment and Care '[]'$  - 0 Incontinence Assessment and Management '[]'$  - 0 Ostomy Care Assessment and Management (repouching, etc.) PROCESS - Coordination of Care '[]'$  - 0 Simple Patient / Family Education for ongoing care '[]'$  - 0 Complex (extensive) Patient / Family Education for ongoing care '[]'$  - 0 Staff obtains Programmer, systems, Records, T Results / Process Orders est '[]'$  - 0 Staff telephones HHA, Nursing Homes / Clarify orders / etc '[]'$  - 0 Routine Transfer to another Facility (non-emergent condition) '[]'$  - 0 Routine Hospital Admission (non-emergent condition) '[]'$  - 0 New Admissions / Biomedical engineer / Ordering NPWT Apligraf, etc. , '[]'$  - 0 Emergency Hospital Admission (emergent condition) PROCESS - Special Needs '[]'$  - 0 Pediatric / Minor Patient Management '[]'$  - 0 Isolation Patient Management '[]'$  - 0 Hearing / Language / Visual special needs '[]'$  - 0 Assessment of Community assistance (transportation, D/C planning, etc.) '[]'$  - 0 Additional assistance / Altered mentation '[]'$  - 0 Support Surface(s) Assessment (bed, cushion, seat, etc.) INTERVENTIONS - Miscellaneous '[]'$  - 0 External ear exam '[]'$  - 0 Patient Transfer (multiple staff / Civil Service fast streamer / Similar devices) '[]'$  - 0 Simple Staple / Suture removal (25 or less) '[]'$  - 0 Complex Staple / Suture removal (26 or more) '[]'$  - 0 Hypo/Hyperglycemic Management (do not check if billed separately) '[]'$  - 0 Ankle / Brachial Index (ABI) - do not  check if billed separately Has the patient been seen at the hospital within the last three years: Yes  Total Score: 0 Level Of Care: ____ Electronic Signature(s) Signed: 07/26/2022 12:44:53 PM By: Rosalio Loud MSN RN CNS WTA Entered By: Rosalio Loud on 07/26/2022 12:20:35 -------------------------------------------------------------------------------- Compression Therapy Details Patient Name: Date of Service: [name], [name] 07/26/2022 11:30 A M Medical Record Number: [id number] Patient Account Number: [id number] Date of Birth/Sex: Treating RN: [birth year] (82 y.o. [name] Primary Care Requan Hardge: Tracie Harrier Other Clinician: Referring Elorah Dewing: Treating Zak Gondek/Extender: Solon Palm Weeks in Treatment: 2 Compression Therapy Performed for Wound Assessment: Wound #1 Left,Anterior Lower Leg Performed By: Clinician Rosalio Loud, RN Compression Type: Rolena Infante [place], Ylianna P ([id number]) [id number] Page 3 of 10 Post Procedure Diagnosis Same as Pre-procedure Electronic Signature(s) Signed: 07/26/2022 12:44:53 PM By: Rosalio Loud MSN RN CNS WTA Entered By: Rosalio Loud on 07/26/2022 12:19:34 -------------------------------------------------------------------------------- Encounter Discharge Information Details Patient Name: Date of Service: [facility] [place], [name] 07/26/2022 11:30 A M Medical Record Number: [id number] Patient Account Number: [id number] Date of Birth/Sex: Treating RN: 1940/07/23 (82 y.o. [name] Primary Care Marialuiza Car: Tracie Harrier Other Clinician: Referring Kye Silverstein: Treating Jaysean Manville/Extender: Solon Palm Weeks in Treatment: 2 Encounter Discharge Information Items Post Procedure Vitals Discharge Condition: Stable Temperature (F): 98.1 Ambulatory Status: Cane Pulse (bpm): 62 Discharge Destination: Home Respiratory Rate (breaths/min): 16 Transportation: Private Auto Blood Pressure  (mmHg): 137/80 Accompanied By: self Schedule Follow-up Appointment: No Clinical Summary of Care: Electronic Signature(s) Signed: 07/26/2022 12:44:53 PM By: Rosalio Loud MSN RN CNS WTA Entered By: Rosalio Loud on 07/26/2022 12:23:16 -------------------------------------------------------------------------------- Lower Extremity Assessment Details Patient Name: Date of Service: [place], [name] 07/26/2022 11:30 A M Medical Record Number: [id number] Patient Account Number: [id number] Date of Birth/Sex: Treating RN: 1940-12-22 (82 y.o. [name] Primary Care Cleland Simkins: Tracie Harrier Other Clinician: Referring Jelisha Weed: Treating Jeymi Hepp/Extender: Nadara Mustard, Vishwanath Weeks in Treatment: 2 Edema Assessment Assessed: [Left: No] [Right: No] [Left: Edema] [Right: :] Calf Beckum, Brinklee P ([id number]) [id number] Page 4 of 10 Left: Right: Point of Measurement: 34 cm From Medial Instep 44 cm Ankle Left: Right: Point of Measurement: 12 cm From Medial Instep 24.8 cm Vascular Assessment Pulses: Dorsalis Pedis Palpable: [Left:Yes] Electronic Signature(s) Signed: 07/26/2022 12:44:53 PM By: Rosalio Loud MSN RN CNS WTA Entered By: Rosalio Loud on 07/26/2022 12:17:53 -------------------------------------------------------------------------------- Multi Wound Chart Details Patient Name: Date of Service: Florentina [place], [name] 07/26/2022 11:30 A M Medical Record Number: [id number] Patient Account Number: [id number] Date of Birth/Sex: Treating RN: 1941-03-06 (82 y.o. [name] Primary Care Shankar Silber: Tracie Harrier Other Clinician: Referring Ryu Cerreta: Treating Secily Walthour/Extender: Nadara Mustard, Vishwanath Weeks in Treatment: 2 Vital Signs Height(in): 69 Pulse(bpm): 62 Weight(lbs): 250 Blood Pressure(mmHg): 137/80 Body Mass Index(BMI): 36.9 Temperature(F): 98.1 Respiratory Rate(breaths/min): 16 [1:Photos:] [N/A:N/A] Left, Anterior  Lower Leg Left, Lateral Lower Leg N/A Wound Location: Gradually Appeared Gradually Appeared N/A Wounding Event: Diabetic Wound/Ulcer of the Lower Diabetic Wound/Ulcer of the Lower N/A Primary Etiology: Extremity Extremity Lymphedema, Congestive Heart Lymphedema, Congestive Heart N/A Comorbid History: Failure, Hypertension, Type II Failure, Hypertension, Type II Diabetes Diabetes 05/01/2022 05/01/2022 N/A Date Acquired: 2 2 N/A Weeks of Treatment: Open Open N/A Wound Status: No No N/A Wound Recurrence: 1.7x0.7x0.1 1.3x1.2x0.2 N/A Measurements L x W x D (cm) 0.935 1.225 N/A A (cm) : rea 0.093 0.245 N/A Volume (cm) : 88.70% 56.70% N/A % Reduction in Area: 88.70% 56.60% N/A % Reduction in Volume: Grade 2 Grade 2 N/A Classification: TAYJAH, LOBDELL ([id number]) [phone].pdf Page 5 of 10 Medium Medium N/A Exudate  A mount: Serosanguineous Serosanguineous N/A Exudate Type: red, brown red, brown N/A Exudate Color: None Present (0%) None Present (0%) N/A Granulation Amount: Large (67-100%) Large (67-100%) N/A Necrotic Amount: Eschar, Adherent Slough Adherent Slough N/A Necrotic Tissue: Fascia: No Fascia: No N/A Exposed Structures: Fat Layer (Subcutaneous Tissue): No Fat Layer (Subcutaneous Tissue): No Tendon: No Tendon: No Muscle: No Muscle: No Joint: No Joint: No Bone: No Bone: No None None N/A Epithelialization: Treatment Notes Electronic Signature(s) Signed: 07/26/2022 12:44:53 PM By: Rosalio Loud MSN RN CNS WTA Entered By: Rosalio Loud on 07/26/2022 12:17:57 -------------------------------------------------------------------------------- Multi-Disciplinary Care Plan Details Patient Name: Date of Service: [place], [name] 07/26/2022 11:30 A M Medical Record Number: [id number] Patient Account Number: [id number] Date of Birth/Sex: Treating RN: December 06, 1940 (82 y.o. [name] Primary Care Aalina Brege: Tracie Harrier Other  Clinician: Referring Jaden Batchelder: Treating Kamaal Cast/Extender: Solon Palm Weeks in Treatment: 2 Active Inactive Necrotic Tissue Nursing Diagnoses: Knowledge deficit related to management of necrotic/devitalized tissue Goals: Patient/caregiver will verbalize understanding of reason and process for debridement of necrotic tissue Date Initiated: 07/12/2022 Target Resolution Date: 08/12/2022 Goal Status: Active Interventions: Assess patient pain level pre-, during and post procedure and prior to discharge Provide education on necrotic tissue and debridement process Notes: Wound/Skin Impairment Nursing Diagnoses: Knowledge deficit related to ulceration/compromised skin integrity Goals: Patient/caregiver will verbalize understanding of skin care regimen Date Initiated: 07/12/2022 Target Resolution Date: 08/12/2022 Goal Status: Active Ulcer/skin breakdown will have a volume reduction of 30% by week 4 Date Initiated: 07/12/2022 Target Resolution Date: 08/12/2022 Goal Status: Active Ulcer/skin breakdown will have a volume reduction of 50% by week 8 Date Initiated: 07/12/2022 Target Resolution Date: 09/10/2022 MIATA, CULBRETH ([id number]) [phone].pdf Page 6 of 10 Goal Status: Active Ulcer/skin breakdown will have a volume reduction of 80% by week 12 Date Initiated: 07/12/2022 Target Resolution Date: 10/11/2022 Goal Status: Active Ulcer/skin breakdown will heal within 14 weeks Date Initiated: 07/12/2022 Target Resolution Date: 11/10/2022 Goal Status: Active Interventions: Assess patient/caregiver ability to obtain necessary supplies Assess patient/caregiver ability to perform ulcer/skin care regimen upon admission and as needed Assess ulceration(s) every visit Notes: Electronic Signature(s) Signed: 07/26/2022 12:44:53 PM By: Rosalio Loud MSN RN CNS WTA Entered By: Rosalio Loud on 07/26/2022  12:16:34 -------------------------------------------------------------------------------- Pain Assessment Details Patient Name: Date of Service: [name], [name] 07/26/2022 11:30 A M Medical Record Number: [id number] Patient Account Number: [id number] Date of Birth/Sex: Treating RN: [birth year] (82 y.o. [name] Primary Care Harlem Bula: Tracie Harrier Other Clinician: Referring Quincy Boy: Treating Crecencio Kwiatek/Extender: Solon Palm Weeks in Treatment: 2 Active Problems Location of Pain Severity and Description of Pain Patient Has Paino No Site Locations Pain Management and Medication Current Pain Management: Electronic Signature(s) Signed: 07/26/2022 12:44:53 PM By: Rosalio Loud MSN RN CNS WTA Entered By: Rosalio Loud on 07/26/2022 11:56:58 Nordmann, Derrill Memo ([id number]) [id number] Page 7 of 10 -------------------------------------------------------------------------------- Patient/Caregiver Education Details Patient Name: Date of Service: [place], [name] 1/26/2024andnbsp11:30 A M Medical Record Number: [id number] Patient Account Number: [id number] Date of Birth/Gender: Treating RN: 21-Mar-1941 (82 y.o. [name] Primary Care Physician: Tracie Harrier Other Clinician: Referring Physician: Treating Physician/Extender: Solon Palm Weeks in Treatment: 2 Education Assessment Education Provided To: Patient Education Topics Provided Wound/Skin Impairment: Electronic Signature(s) Signed: 07/26/2022 12:44:53 PM By: Rosalio Loud MSN RN CNS WTA Entered By: Rosalio Loud on 07/26/2022 12:16:29 -------------------------------------------------------------------------------- Wound Assessment Details Patient Name: Date of Service: [name], [name] 07/26/2022 11:30 A M Medical Record Number:  [id number] Patient Account Number: [id number] Date of Birth/Sex: Treating RN: 03-01-1941 (82 y.o. [name] Primary  Care Aishwarya Shiplett: Tracie Harrier Other Clinician: Referring Caprisha Bridgett: Treating Brandan Robicheaux/Extender: Solon Palm Weeks in Treatment: 2 Wound Status Wound Number: 1 Primary Diabetic Wound/Ulcer of the Lower Extremity Etiology: Wound Location: Left, Anterior Lower Leg Wound Status: Open Wounding Event: Gradually Appeared Comorbid Lymphedema, Congestive Heart Failure, Hypertension, Type Date Acquired: 05/01/2022 History: II Diabetes Weeks Of Treatment: 2 Clustered Wound: No Photos JAHZARA, SLATTERY P ([id number]) [phone].pdf Page 8 of 10 Wound Measurements Length: (cm) 1.7 Width: (cm) 0.7 Depth: (cm) 0.1 Area: (cm) 0.935 Volume: (cm) 0.093 % Reduction in Area: 88.7% % Reduction in Volume: 88.7% Epithelialization: None Wound Description Classification: Grade 2 Exudate Amount: Medium Exudate Type: Serosanguineous Exudate Color: red, brown Foul Odor After Cleansing: No Slough/Fibrino Yes Wound Bed Granulation Amount: None Present (0%) Exposed Structure Necrotic Amount: Large (67-100%) Fascia Exposed: No Necrotic Quality: Eschar, Adherent Slough Fat Layer (Subcutaneous Tissue) Exposed: No Tendon Exposed: No Muscle Exposed: No Joint Exposed: No Bone Exposed: No Treatment Notes Wound #1 (Lower Leg) Wound Laterality: Left, Anterior Cleanser Soap and Water Discharge Instruction: Gently cleanse wound with antibacterial soap, rinse and pat dry prior to dressing wounds Peri-Wound Care Topical Primary Dressing Hydrofera Blue Ready Transfer Foam, 2.5x2.5 (in/in) Discharge Instruction: Apply Hydrofera Blue Ready to wound bed as directed Secondary Dressing ABD Pad 5x9 (in/in) Discharge Instruction: Cover with ABD pad Gauze Discharge Instruction: As directed: dry, moistened with saline or moistened with Dakins Solution Secured With Compression Wrap UNNA BOOT - Unna-Z Zinc and Calamine Boot, 4x10 (in/yd) Discharge Instruction: Use to  anchor wrap in place. Apply one strip around leg 3-finger widths below the knee to secure the wrap. Compression Stockings Add-Ons Electronic Signature(s) Signed: 07/26/2022 12:44:53 PM By: Rosalio Loud MSN RN CNS WTA Entered By: Rosalio Loud on 07/26/2022 12:04:54 Spero Curb ([id number]) [id number] Page 9 of 10 -------------------------------------------------------------------------------- Wound Assessment Details Patient Name: Date of Service: [place], [name] 07/26/2022 11:30 A M Medical Record Number: [id number] Patient Account Number: [id number] Date of Birth/Sex: Treating RN: [birth year] (82 y.o. [name] Primary Care Rolly Magri: Tracie Harrier Other Clinician: Referring Tim Corriher: Treating Hilari Wethington/Extender: Solon Palm Weeks in Treatment: 2 Wound Status Wound Number: 2 Primary Diabetic Wound/Ulcer of the Lower Extremity Etiology: Wound Location: Left, Lateral Lower Leg Wound Status: Open Wounding Event: Gradually Appeared Comorbid Lymphedema, Congestive Heart Failure, Hypertension, Type Date Acquired: 05/01/2022 History: II Diabetes Weeks Of Treatment: 2 Clustered Wound: No Photos Wound Measurements Length: (cm) 1.3 Width: (cm) 1.2 Depth: (cm) 0.2 Area: (cm) 1.225 Volume: (cm) 0.245 % Reduction in Area: 56.7% % Reduction in Volume: 56.6% Epithelialization: None Wound Description Classification: Grade 2 Exudate Amount: Medium Exudate Type: Serosanguineous Exudate Color: red, brown Foul Odor After Cleansing: No Slough/Fibrino Yes Wound Bed Granulation Amount: None Present (0%) Exposed Structure Necrotic Amount: Large (67-100%) Fascia Exposed: No Necrotic Quality: Adherent Slough Fat Layer (Subcutaneous Tissue) Exposed: No Tendon Exposed: No Muscle Exposed: No Joint Exposed: No Bone Exposed: No Treatment Notes Wound #2 (Lower Leg) Wound Laterality: Left, Lateral Cleanser Soap and Water Discharge  Instruction: Gently cleanse wound with antibacterial soap, rinse and pat dry prior to dressing wounds Peri-Wound Care LA, SHEHAN ([id number]) [id number] Page 10 of 10 Topical Primary Dressing Hydrofera Blue Ready Transfer Foam, 2.5x2.5 (in/in) Discharge Instruction: Apply Hydrofera Blue Ready to wound bed as directed Secondary Dressing ABD Pad 5x9 (in/in) Discharge Instruction: Cover with ABD pad Gauze Discharge Instruction:  As directed: dry, moistened with saline or moistened with Dakins Solution Secured With Compression Wrap UNNA BOOT - Unna-Z Zinc and Calamine Boot, 4x10 (in/yd) Discharge Instruction: Use to anchor wrap in place. Apply one strip around leg 3-finger widths below the knee to secure the wrap. Compression Stockings Add-Ons Electronic Signature(s) Signed: 07/26/2022 12:44:53 PM By: Rosalio Loud MSN RN CNS WTA Entered By: Rosalio Loud on 07/26/2022 12:05:14 -------------------------------------------------------------------------------- Vitals Details Patient Name: Date of Service: [name], [name] 07/26/2022 11:30 A M Medical Record Number: [id number] Patient Account Number: [id number] Date of Birth/Sex: Treating RN: 05/16/1941 (82 y.o. [name] Primary Care Marily Konczal: Tracie Harrier Other Clinician: Referring Bobbijo Holst: Treating Jabree Rebert/Extender: Solon Palm Weeks in Treatment: 2 Vital Signs Time Taken: 11:56 Temperature (F): 98.1 Height (in): 69 Pulse (bpm): 62 Weight (lbs): 250 Respiratory Rate (breaths/min): 16 Body Mass Index (BMI): 36.9 Blood Pressure (mmHg): 137/80 Reference Range: 80 - 120 mg / dl Electronic Signature(s) Signed: 07/26/2022 12:44:53 PM By: Rosalio Loud MSN RN CNS WTA Entered By: Rosalio Loud on 07/26/2022 11:56:53

## 2022-07-26 NOTE — Progress Notes (Addendum)
MODESTY, RUDY ([id number]) [id number] Page 1 of 8 Visit Report for 07/26/2022 Chief Complaint Document Details Patient Name: Date of Service: [name] 07/26/2022 11:30 Wanda M Medical Record Number: [id number] Patient Account Number: [id number] Date of Birth/Sex: Treating RN: 08/05/1940 (82 y.o. [name] Primary Care Provider: Tracie Harrier Other Clinician: Referring Provider: Treating Provider/Extender: Solon Palm Weeks [name]: 2 Information Obtained from: Patient Chief Complaint Left LE Ulcers Electronic Signature(s) Signed: 07/26/2022 11:54:13 AM By: Worthy Keeler PA-C Entered By: Worthy Keeler on 07/26/2022 11:54:13 -------------------------------------------------------------------------------- Debridement Details Patient Name: Date of Service: [name], [name] 07/26/2022 11:30 Wanda M Medical Record Number: [id number] Patient Account Number: [id number] Date of Birth/Sex: Treating RN: [birth year] (82 y.o. [name] Primary Care Provider: Tracie Harrier Other Clinician: Referring Provider: Treating Provider/Extender: Solon Palm Weeks [name]: 2 Debridement Performed for Assessment: Wound #2 Left,Lateral Lower Leg Performed By: Physician Tommie Sams., PA-C Debridement Type: Debridement Severity of Tissue Pre Debridement: Fat layer exposed Level of Consciousness (Pre-procedure): Awake and Alert Pre-procedure Verification/Time Out Yes - 12:18 Taken: Start Time: 12:18 Pain Control: Lidocaine 4% T opical Solution T Area Debrided (L x W): otal 1.3 (cm) x 1.2 (cm) = 1.56 (cm) Tissue and other material debrided: Viable, Non-Viable, Slough, Subcutaneous, Slough Level: Skin/Subcutaneous Tissue Debridement Description: Excisional Instrument: Curette Bleeding: Minimum Hemostasis Achieved: Pressure Response to Treatment: Procedure was tolerated well Level of Consciousness  (Post- Awake and Alert procedure): [name], [name] ([id number]) [id number] Page 2 of 8 Post Debridement Measurements of Total Wound Length: (cm) 1.3 Width: (cm) 1.2 Depth: (cm) 0.3 Volume: (cm) 0.368 Character of Wound/Ulcer Post Debridement: Stable Severity of Tissue Post Debridement: Fat layer exposed Post Procedure Diagnosis Same as Pre-procedure Electronic Signature(s) Signed: 07/26/2022 12:44:53 PM By: Rosalio Loud MSN RN CNS WTA Signed: 07/26/2022 2:17:33 PM By: Worthy Keeler PA-C Entered By: Rosalio Loud on 07/26/2022 12:19:15 -------------------------------------------------------------------------------- HPI Details Patient Name: Date of Service: [name], Wanda UDREY [name]. 07/26/2022 11:30 Wanda M Medical Record Number: [id number] Patient Account Number: [id number] Date of Birth/Sex: Treating RN: 1941/06/11 (82 y.o. [name] Primary Care Provider: Tracie Harrier Other Clinician: Referring Provider: Treating Provider/Extender: Solon Palm Weeks [name]: 2 History of Present Illness HPI Description: 07-12-2022 upon evaluation today patient presents for initial inspection concerning issues she has been having with wounds on her left lower extremity. She thinks she may have hit the lateral portion of her leg on the dishwasher door but the other she thinks may have been Wanda tape injury. It is more of Wanda blister type situation. With that being said she tells me that she is having some discomfort although is not terrible at this point. Fortunately I do not see any signs of infection locally or systemically at this time which is great news. No fevers, chills, nausea, vomiting, or diarrhea. Patient does have Wanda history of diabetes mellitus type 2, lymphedema for which she is not really able to wear compression socks that she tells me she cannot get them on. She also has hypertension, congestive heart failure, and chronic kidney disease  stage III. 07-26-2022 upon evaluation today patient appears to be doing well currently [name]. The compression wrapping does seem to be helping I am good I perform some debridement [name] but the other wound is actually looking quite well. I think we are headed [name] the right direction. Of note  she did put her compression wrap down she was stating that this was getting too tight. Electronic Signature(s) Signed: 07/26/2022 1:30:54 PM By: Worthy Keeler PA-C Entered By: Worthy Keeler on 07/26/2022 13:30:54 -------------------------------------------------------------------------------- Physical Exam Details Patient Name: Date of Service: [name], [name] 07/26/2022 11:30 Wanda [name], [name] ([id number]) [phone].pdf Page 3 of 8 Medical Record Number: [id number] Patient Account Number: [id number] Date of Birth/Sex: Treating RN: 1940/08/24 (82 y.o. [name] Primary Care Provider: Tracie Harrier Other Clinician: Referring Provider: Treating Provider/Extender: Solon Palm Weeks [name]: 2 Constitutional Obese and well-hydrated [name] no acute distress. Respiratory normal breathing without difficulty. Psychiatric this patient is able to make decisions and demonstrates good insight into disease process. Alert and Oriented x 3. pleasant and cooperative. Notes Upon inspection patient's wound bed actually showed signs of good granulation epithelization at this point. Fortunately I do not see any signs of infection locally or systemically which is great news and overall I am extremely pleased with where we stand at this point. Electronic Signature(s) Signed: 07/26/2022 1:34:49 PM By: Worthy Keeler PA-C Entered By: Worthy Keeler on 07/26/2022 13:34:49 -------------------------------------------------------------------------------- Physician Orders Details Patient Name: Date of Service: [place],  [name] 07/26/2022 11:30 Wanda M Medical Record Number: [id number] Patient Account Number: [id number] Date of Birth/Sex: Treating RN: April 21, 1941 (82 y.o. [name] Primary Care Provider: Tracie Harrier Other Clinician: Referring Provider: Treating Provider/Extender: Solon Palm Weeks [name]: 2 Verbal / Phone Orders: No Diagnosis Coding ICD-10 Coding Code Description E11.622 Type 2 diabetes mellitus with other skin ulcer I89.0 Lymphedema, not elsewhere classified L97.822 Non-pressure chronic ulcer of other part of left lower leg with fat layer exposed I10 Essential (primary) hypertension I50.42 Chronic combined systolic (congestive) and diastolic (congestive) heart failure N18.30 Chronic kidney disease, stage 3 unspecified Follow-up Appointments Return Appointment [name] 1 week. Bathing/ Shower/ Hygiene May shower with wound dressing protected with water repellent cover or cast protector. Anesthetic (Use 'Patient Medications' Section for Anesthetic Order Entry) Lidocaine applied to wound bed Edema Control - Lymphedema / Segmental Compressive Device / Other Unna Boot for Lymphedema Elevate, Exercise Daily and Wanda void Standing for Long Periods of Time. Elevate legs to the level of the heart and pump ankles as often as possible Elevate leg(s) parallel to the floor when sitting. [name], [name] ([id number]) [id number] Page 4 of 8 Wound Treatment Wound #1 - Lower Leg Wound Laterality: Left, Anterior Cleanser: Soap and Water 1 x Per Week/30 Days Discharge Instructions: Gently cleanse wound with antibacterial soap, rinse and pat dry prior to dressing wounds Prim Dressing: Hydrofera Blue Ready Transfer Foam, 2.5x2.5 ([name]/[name]) 1 x Per Week/30 Days ary Discharge Instructions: Apply Hydrofera Blue Ready to wound bed as directed Secondary Dressing: ABD Pad 5x9 ([name]/[name]) 1 x Per Week/30 Days Discharge Instructions: Cover with ABD  pad Secondary Dressing: Gauze 1 x Per Week/30 Days Discharge Instructions: As directed: dry, moistened with saline or moistened with Dakins Solution Compression Wrap: UNNA BOOT - Unna-Z Zinc and Calamine Boot, 4x10 ([name]/yd) 1 x Per Week/30 Days Discharge Instructions: Use to anchor wrap [name] place. Apply one strip around leg 3-finger widths below the knee to secure the wrap. Wound #2 - Lower Leg Wound Laterality: Left, Lateral Cleanser: Soap and Water 1 x Per Week/30 Days Discharge Instructions: Gently cleanse wound with antibacterial soap, rinse and pat dry prior to dressing wounds Prim Dressing: Hydrofera Blue Ready Transfer Foam, 2.5x2.5 ([name]/[name]) 1 x Per Week/30  Days ary Discharge Instructions: Apply Hydrofera Blue Ready to wound bed as directed Secondary Dressing: ABD Pad 5x9 ([name]/[name]) 1 x Per Week/30 Days Discharge Instructions: Cover with ABD pad Secondary Dressing: Gauze 1 x Per Week/30 Days Discharge Instructions: As directed: dry, moistened with saline or moistened with Dakins Solution Compression Wrap: UNNA BOOT - Unna-Z Zinc and Calamine Boot, 4x10 ([name]/yd) 1 x Per Week/30 Days Discharge Instructions: Use to anchor wrap [name] place. Apply one strip around leg 3-finger widths below the knee to secure the wrap. Electronic Signature(s) Signed: 07/26/2022 12:44:53 PM By: Rosalio Loud MSN RN CNS WTA Signed: 07/26/2022 2:17:33 PM By: Worthy Keeler PA-C Entered By: Rosalio Loud on 07/26/2022 12:20:29 -------------------------------------------------------------------------------- Problem List Details Patient Name: Date of Service: [place], Wanda UDREY [name]. 07/26/2022 11:30 Wanda M Medical Record Number: [id number] Patient Account Number: [id number] Date of Birth/Sex: Treating RN: 30-Dec-1940 (82 y.o. 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/06/2023 No Yes I10 Essential (primary) hypertension 07/12/2022 No Yes I50.42 Chronic combined systolic (congestive) and diastolic (congestive) heart failure 07/12/2022 No Yes N18.30 Chronic kidney disease, stage 3 unspecified 07/12/2022 No Yes Inactive Problems Resolved Problems Electronic Signature(s) Signed: 07/26/2022 12:44:53 PM By: Rosalio Loud MSN RN CNS WTA Signed: 07/26/2022 2:17:33 PM By: Worthy Keeler PA-C Previous Signature: 07/26/2022 11:54:09 AM Version By: Worthy Keeler PA-C Entered By: Rosalio Loud on 07/26/2022 12:17:29 -------------------------------------------------------------------------------- Progress Note Details Patient Name: Date of Service: [name], Wanda UDREY [name]. 07/26/2022 11:30 Wanda M Medical Record Number: [id number] Patient Account Number: [id number] Date of Birth/Sex: Treating RN: July 17, 1940 (82 y.o. [name] Primary Care Provider: Tracie Harrier Other Clinician: Referring Provider: Treating Provider/Extender: Solon Palm Weeks [name]: 2 Subjective Chief Complaint Information obtained from Patient Left LE Ulcers History of Present Illness (HPI) 07-12-2022 upon evaluation today patient presents for initial inspection concerning issues she has been having with wounds on her left lower extremity. She thinks she may have hit the lateral portion of her leg on the dishwasher door but the other she thinks may have been Wanda tape injury. It is more of Wanda blister type situation. With that being said she tells me that she is having some discomfort although is not terrible  at this point. Fortunately I do not see any signs of infection locally or systemically at this time which is great news. No fevers, chills, nausea, vomiting, or diarrhea. Patient does have Wanda history of diabetes mellitus type 2, lymphedema for which she is not really able to wear compression socks that she tells me she cannot get them on. She also has hypertension, congestive heart failure, and chronic kidney disease stage III. 07-26-2022 upon evaluation today patient appears to be doing well currently [name]. The compression wrapping does seem to be helping I am good I perform some debridement [name] but the other wound is actually looking quite well. I think we are headed [name] the right direction. Of note she did put her compression wrap down she was stating that this was getting too tight. [name],  [name] ([id number]) [id number] Page 6 of 8 Objective Constitutional Obese and well-hydrated [name] no acute distress. Vitals Time Taken: 11:56 AM, Height: 69 [name], Weight: 250 lbs, BMI: 36.9, Temperature: 98.1 F, Pulse: 62 bpm, Respiratory Rate: 16 breaths/min, Blood Pressure: 137/80 mmHg. Respiratory normal breathing without difficulty. Psychiatric this patient is able to make decisions and demonstrates good insight into disease process. Alert and Oriented x 3. pleasant and cooperative. General Notes: Upon inspection patient's wound bed actually showed signs of good granulation epithelization at this point. Fortunately I do not see any signs of infection locally or systemically which is great news and overall I am extremely pleased with where we stand at this point. Integumentary (Hair, Skin) Wound #1 status is Open. Original cause of wound was Gradually Appeared. The date acquired was: 05/01/2022. The wound has been [name] 2 weeks. The wound is located on the Left,Anterior Lower Leg. The wound measures 1.7cm length x 0.7cm width  x 0.1cm depth; 0.935cm^2 area and 0.093cm^3 volume. There is Wanda medium amount of serosanguineous drainage noted. There is no granulation within the wound bed. There is Wanda large (67-100%) amount of necrotic tissue within the wound bed including Eschar and Adherent Slough. Wound #2 status is Open. Original cause of wound was Gradually Appeared. The date acquired was: 05/01/2022. The wound has been [name] 2 weeks. The wound is located on the Left,Lateral Lower Leg. The wound measures 1.3cm length x 1.2cm width x 0.2cm depth; 1.225cm^2 area and 0.245cm^3 volume. There is Wanda medium amount of serosanguineous drainage noted. There is no granulation within the wound bed. There is Wanda large (67-100%) amount of necrotic tissue within the wound bed including Adherent Slough. Assessment Active Problems ICD-10 Type 2 diabetes mellitus with other skin ulcer Lymphedema, not elsewhere classified Non-pressure chronic ulcer of other part of left lower leg with fat layer exposed Essential (primary) hypertension Chronic combined systolic (congestive) and diastolic (congestive) heart failure Chronic kidney disease, stage 3 unspecified Procedures Wound #2 Pre-procedure diagnosis of Wound #2 is Wanda Diabetic Wound/Ulcer of the Lower Extremity located on the Left,Lateral Lower Leg .Severity of Tissue Pre Debridement is: Fat layer exposed. There was Wanda Excisional Skin/Subcutaneous Tissue Debridement with Wanda total area of 1.56 sq cm performed by Tommie Sams., PA-C. With the following instrument(s): Curette to remove Viable and Non-Viable tissue/material. Material removed includes Subcutaneous Tissue and Slough and after achieving pain control using Lidocaine 4% Topical Solution. No specimens were taken. Wanda time out was conducted at 12:18, prior to the start of the procedure. Wanda Minimum amount of bleeding was controlled with Pressure. The procedure was tolerated well. Post Debridement Measurements: 1.3cm length x 1.2cm width  x 0.3cm depth; 0.368cm^3 volume. Character of Wound/Ulcer Post Debridement is stable. Severity of Tissue Post Debridement is: Fat layer exposed. Post procedure Diagnosis Wound #2: Same as Pre-Procedure Wound #1 Pre-procedure diagnosis of Wound #1 is Wanda Diabetic Wound/Ulcer of the Lower Extremity located on the Left,Anterior Lower Leg . There was Wanda Haematologist Compression Therapy Procedure by Rosalio Loud, RN. Post procedure Diagnosis Wound #1: Same as Pre-Procedure Plan Follow-up Appointments: Return Appointment [name] 1 week. Bathing/ Shower/ Hygiene: May shower with wound dressing protected with water repellent cover or cast protector. Anesthetic (Use 'Patient Medications' Section for Anesthetic Order Entry): Lidocaine applied to wound bed Edema Control - Lymphedema / Segmental Compressive Device / Other: Unna Boot for Lymphedema Elevate, Exercise Daily and Avoid Standing for Long Periods of Time. Elevate legs to the level of the  heart and pump ankles as often as possible Elevate leg(s) parallel to the floor when sitting. WOUND #1: - Lower Leg Wound Laterality: Left, Anterior [name], [name] ([id number]) [phone].pdf Page 7 of 8 Cleanser: Soap and Water 1 x Per Week/30 Days Discharge Instructions: Gently cleanse wound with antibacterial soap, rinse and pat dry prior to dressing wounds Prim Dressing: Hydrofera Blue Ready Transfer Foam, 2.5x2.5 ([name]/[name]) 1 x Per Week/30 Days ary Discharge Instructions: Apply Hydrofera Blue Ready to wound bed as directed Secondary Dressing: ABD Pad 5x9 ([name]/[name]) 1 x Per Week/30 Days Discharge Instructions: Cover with ABD pad Secondary Dressing: Gauze 1 x Per Week/30 Days Discharge Instructions: As directed: dry, moistened with saline or moistened with Dakins Solution Com pression Wrap: UNNA BOOT - Unna-Z Zinc and Calamine Boot, 4x10 ([name]/yd) 1 x Per Week/30 Days Discharge Instructions: Use to anchor wrap [name] place. Apply one strip around  leg 3-finger widths below the knee to secure the wrap. WOUND #2: - Lower Leg Wound Laterality: Left, Lateral Cleanser: Soap and Water 1 x Per Week/30 Days Discharge Instructions: Gently cleanse wound with antibacterial soap, rinse and pat dry prior to dressing wounds Prim Dressing: Hydrofera Blue Ready Transfer Foam, 2.5x2.5 ([name]/[name]) 1 x Per Week/30 Days ary Discharge Instructions: Apply Hydrofera Blue Ready to wound bed as directed Secondary Dressing: ABD Pad 5x9 ([name]/[name]) 1 x Per Week/30 Days Discharge Instructions: Cover with ABD pad Secondary Dressing: Gauze 1 x Per Week/30 Days Discharge Instructions: As directed: dry, moistened with saline or moistened with Dakins Solution Com pression Wrap: UNNA BOOT - Unna-Z Zinc and Calamine Boot, 4x10 ([name]/yd) 1 x Per Week/30 Days Discharge Instructions: Use to anchor wrap [name] place. Apply one strip around leg 3-finger widths below the knee to secure the wrap. 1. Based on what I am seeing I do believe that the patient should continue to elevate her legs is much as possible to help with edema control I think this is going to be very important continuing and ongoing. 2. I am also can recommend that we have the patient continue with the Unna boot wrap with Wanda switch from Wanda 3 layer to the Unna boot to see if this will do better as far as hopefully not getting too tight. She does not need to be pushing the wrap down. We will see patient back for reevaluation [name] 1 week here [name] the clinic. If anything worsens or changes patient will contact our office for additional recommendations. Electronic Signature(s) Signed: 07/26/2022 1:35:14 PM By: Worthy Keeler PA-C Entered By: Worthy Keeler on 07/26/2022 13:35:14 -------------------------------------------------------------------------------- SuperBill Details Patient Name: Date of Service: [name], [place]. 07/26/2022 Medical Record Number: [id number] Patient Account Number: [id number] Date of Birth/Sex:  Treating RN: Jun 23, 1941 (82 y.o. [name] Primary Care Provider: Tracie Harrier Other Clinician: Referring Provider: Treating Provider/Extender: Solon Palm Weeks [name]: 2 Diagnosis Coding ICD-10 Codes Code Description E11.622 Type 2 diabetes mellitus with other skin ulcer I89.0 Lymphedema, not elsewhere classified L97.822 Non-pressure chronic ulcer of other part of left lower leg with fat layer exposed I10 Essential (primary) hypertension I50.42 Chronic combined systolic (congestive) and diastolic (congestive) heart failure N18.30 Chronic kidney disease, stage 3 unspecified Facility Procedures : [name], [name] Code: [id number] [name] ([id number]) Description: 11042 - DEB SUBQ TISSUE 20 SQ CM/< ICD-10 Diagnosis Description L97.822 Non-pressure chronic ulcer of other part of left lower leg with fat layer expos [phone] Modifier: [id number] [name] Quantity: 1 age 70 of 22 Physician Procedures :  CPT4 Code Description Modifier [id number] - WC PHYS SUBQ TISS 20 SQ CM ICD-10 Diagnosis Description L97.822 Non-pressure chronic ulcer of other part of left lower leg with fat layer exposed Quantity: 1 Electronic Signature(s) Signed: 07/26/2022 1:35:35 PM By: Worthy Keeler PA-C Entered By: Worthy Keeler on 07/26/2022 13:35:35

## 2022-07-28 ENCOUNTER — Encounter (INDEPENDENT_AMBULATORY_CARE_PROVIDER_SITE_OTHER): Payer: Self-pay | Admitting: Nurse Practitioner

## 2022-07-28 NOTE — Progress Notes (Signed)
Subjective:    Patient ID: [name], female    DOB: [birth year], 82 y.o.   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 The carotid stenosis followed by ultrasound.   The patient denies amaurosis fugax. There is no recent history of TIA symptoms or focal motor deficits. There is no prior documented CVA.  The patient is taking enteric-coated aspirin 81 mg daily.  There is no history of migraine headaches. There is no history of seizures.  The patient has a history of coronary artery disease, no recent episodes of angina or shortness of breath. There is a history of hyperlipidemia which is being treated with a statin.    Carotid Duplex done today shows 40 to 59% stenosis bilaterally, which is consistent with previous studies.    Review of Systems  Cardiovascular:  Positive for leg swelling.  Neurological:  Positive for weakness.  All other systems reviewed and are negative.      Objective:   Physical Exam Vitals reviewed.  HENT:     Head: Normocephalic.  Neck:     Vascular: No carotid bruit.  Cardiovascular:     Rate and Rhythm: Normal rate.     Pulses:          Dorsalis pedis pulses are 1+ on the right side and 1+ on the left side.  Pulmonary:     Effort: Pulmonary effort is normal.  Skin:    General: Skin is warm and dry.  Neurological:     Mental Status: She is alert and oriented to person, place, and time.  Psychiatric:        Mood and Affect: Mood normal.        Behavior: Behavior normal.        Thought Content: Thought content normal.        Judgment: Judgment normal.     BP 135/80   Pulse 69   Ht '5\' 11"'$  (1.803 m)   Wt 252 lb (114.3 kg)   BMI 35.15 kg/m   Past Medical History:  Diagnosis Date   Acquired hammertoe    other   Acquired hammertoe    Angina pectoris, unspecified (HCC)    Angina pectoris, unspecified  ([place])    Arthritis    knees   Breast cancer ([place]) 1990   left breast   Cancer ([place]) 1990,s   left breast   CHF (congestive heart failure) (HCC)    Chronic kidney disease    stage 2   Degenerative joint disease    Dermatophytosis of nail    Diabetes mellitus type 2, uncomplicated (HCC)    diet controlled   Dyspnea    Essential hypertension, benign    Exostosis    unspecified site   Fracture of right ankle 2010   Followed by Dr. Vickki Muff   GERD (gastroesophageal reflux disease)    Hemorrhoids    Hypercholesterolemia    Hyperlipidemia, unspecified    Hypertension    Lymphedema of leg    left leg worse than right leg.   Obesity, unspecified    Osteoarthrosis involving lower leg    unspecified whether generalized or localized    Pre-diabetes    Pure hypercholesterolemia    Vitamin D deficiency, unspecified    Wears dentures    partial upper    Social History   Socioeconomic History  Marital status: Widowed    Spouse name: Not on file   Number of children: 2   Years of education: Not on file   Highest education level: Not on file  Occupational History   Occupation: Retired  Tobacco Use   Smoking status: Never   Smokeless tobacco: Never  Vaping Use   Vaping Use: Never used  Substance and Sexual Activity   Alcohol use: Yes    Comment: 1-2 drinks every 1-2 months   Drug use: No   Sexual activity: Not on file  Other Topics Concern   Not on file  Social History Narrative   Not on file   Social Determinants of Health   Financial Resource Strain: Not on file  Food Insecurity: Not on file  Transportation Needs: Not on file  Physical Activity: Not on file  Stress: Not on file  Social Connections: Not on file  Intimate Partner Violence: Not on file    Past Surgical History:  Procedure Laterality Date   BREAST LUMPECTOMY Left 1995   BREAST SURGERY Left 1990   lumpectomy   BUNIONECTOMY Bilateral 1990's   plus hammer toe repair on left foot.   CATARACT  EXTRACTION W/PHACO Left 08/10/2018   Procedure: CATARACT EXTRACTION PHACO AND INTRAOCULAR LENS PLACEMENT ([place])  LEFT DIABETIC;  Surgeon: Eulogio Bear, MD;  Location: [place];  Service: Ophthalmology;  Laterality: Left;  diabetic - diet controlled   CATARACT EXTRACTION W/PHACO Right 11/16/2018   Procedure: CATARACT EXTRACTION PHACO AND INTRAOCULAR LENS PLACEMENT ([place])  RIGHT DIABETIC;  Surgeon: Eulogio Bear, MD;  Location: [place];  Service: Ophthalmology;  Laterality: Right;  Diabetic - diet controlled   COLONOSCOPY     COLONOSCOPY N/A 11/01/2021   Procedure: COLONOSCOPY;  Surgeon: Annamaria Helling, DO;  Location: [facility] ENDOSCOPY;  Service: Gastroenterology;  Laterality: N/A;  DIET CONTROLLED   COLONOSCOPY WITH PROPOFOL N/A 05/08/2018   Procedure: COLONOSCOPY WITH PROPOFOL;  Surgeon: Lollie Sails, MD;  Location: [facility] ENDOSCOPY;  Service: Endoscopy;  Laterality: N/A;   ENDOMETRIAL BIOPSY     EYE SURGERY     FOOT SURGERY Left    for bunionectomy and hammertoe   JOINT REPLACEMENT     TONSILLECTOMY     TOTAL KNEE ARTHROPLASTY Left 04/08/2017   Procedure: TOTAL KNEE ARTHROPLASTY;  Surgeon: Corky Mull, MD;  Location: [facility];  Service: Orthopedics;  Laterality: Left;   TOTAL KNEE ARTHROPLASTY Right 08/19/2017   Procedure: TOTAL KNEE ARTHROPLASTY;  Surgeon: Corky Mull, MD;  Location: [facility];  Service: Orthopedics;  Laterality: Right;    Family History  Problem Relation Age of Onset   Stroke Mother    Hypertension Mother    Cancer Father    Prostate cancer Brother    Breast cancer Maternal Aunt 89   Breast cancer Cousin        mat cousin   Kidney cancer Neg Hx    Kidney disease Neg Hx     Allergies  Allergen Reactions   Colesevelam Other (See Comments)    constipation   Oxybutynin Swelling    Leg swelling   Rosuvastatin Other (See Comments)    Pain in lower extremities   Zoster Vaccine Recombinant, Adjuvanted Hives        Latest Ref Rng & Units 04/23/2022   10:14 AM 04/20/2021    9:46 AM 04/14/2020    9:10 AM  CBC  WBC 4.0 - 10.5 K/uL 5.0  5.8  5.2   Hemoglobin  12.0 - 15.0 g/dL 14.5  14.8  14.7   Hematocrit 36.0 - 46.0 % 45.0  44.1  44.5   Platelets 150 - 400 K/uL 172  172  164       CMP     Component Value Date/Time   NA 141 04/23/2022 1014   K 3.9 04/23/2022 1014   CL 110 04/23/2022 1014   CO2 27 04/23/2022 1014   GLUCOSE 113 (H) 04/23/2022 1014   BUN 20 04/23/2022 1014   CREATININE 0.92 04/23/2022 1014   CALCIUM 9.6 04/23/2022 1014   PROT 7.0 04/23/2022 1014   ALBUMIN 3.8 04/23/2022 1014   AST 15 04/23/2022 1014   ALT 9 04/23/2022 1014   ALKPHOS 88 04/23/2022 1014   BILITOT 0.5 04/23/2022 1014   GFRNONAA >60 04/23/2022 1014   GFRAA >60 08/22/2017 0601     VAS [place] ABI WITH/WO TBI  Result Date: 08/16/2021  LOWER EXTREMITY DOPPLER STUDY Patient Name:  HAIZLEE HENTON  Date of Exam:   08/14/2021 Medical Rec #: [id number]        Accession #:    [id number] Date of Birth: December 15, 1940        Patient Gender: F Patient Age:   29 years Exam Location:  [facility] Procedure:      VAS [place] ABI WITH/WO TBI Referring Phys: --------------------------------------------------------------------------------  Indications: Left hip pain.  Performing Technologist: Concha Norway RVT  Examination Guidelines: A complete evaluation includes at minimum, Doppler waveform signals and systolic blood pressure reading at the level of bilateral brachial, anterior tibial, and posterior tibial arteries, when vessel segments are accessible. Bilateral testing is considered an integral part of a complete examination. Photoelectric Plethysmograph (PPG) waveforms and toe systolic pressure readings are included as required and additional duplex testing as needed. Limited examinations for reoccurring indications may be performed as noted.  ABI Findings: +---------+------------------+-----+---------+--------+ Right    Rt Pressure  (mmHg)IndexWaveform Comment  +---------+------------------+-----+---------+--------+ Brachial 138                                      +---------+------------------+-----+---------+--------+ ATA      142               1.03 triphasic         +---------+------------------+-----+---------+--------+ PTA      144               1.04 triphasic         +---------+------------------+-----+---------+--------+ Great Toe133               0.96 Normal            +---------+------------------+-----+---------+--------+ +---------+------------------+-----+---------+-------+ Left     Lt Pressure (mmHg)IndexWaveform Comment +---------+------------------+-----+---------+-------+ Brachial 138                                     +---------+------------------+-----+---------+-------+ ATA      145               1.05 triphasic        +---------+------------------+-----+---------+-------+ PTA      151               1.09 triphasic        +---------+------------------+-----+---------+-------+ Great Toe145               1.05 Normal           +---------+------------------+-----+---------+-------+ +-------+-----------+-----------+------------+------------+  ABI/TBIToday's ABIToday's TBIPrevious ABIPrevious TBI +-------+-----------+-----------+------------+------------+ Right  1.04       .96        1.30        1.19         +-------+-----------+-----------+------------+------------+ Left   1.09       1.05       1.33        1.24         +-------+-----------+-----------+------------+------------+ Bilateral ABIs and TBIs appear essentially unchanged compared to prior study on 2019.  Summary: Right: Resting right ankle-brachial index is within normal range. No evidence of significant right lower extremity arterial disease. The right toe-brachial index is normal. Left: Resting left ankle-brachial index is within normal range. No evidence of significant left lower extremity  arterial disease. The left toe-brachial index is normal.  *See table(s) above for measurements and observations.  Electronically signed by Hortencia Pilar MD on 08/16/2021 at 3:47:40 PM.    Final        Assessment & Plan:   1. Bilateral carotid artery stenosis Recommend:  Given the patient's asymptomatic subcritical stenosis no further invasive testing or surgery at this time.  Duplex ultrasound shows 40 to 59% stenosis bilaterally  Continue antiplatelet therapy as prescribed Continue management of CAD, HTN and Hyperlipidemia Healthy heart diet,  encouraged exercise at least 4 times per week Follow up in 12 months with duplex ultrasound and physical exam    2. Pain in both lower extremities The patient's lower extremity pain is concerning for possible claudication given her history.  We will have her return in the next week or so for noninvasive studies to ensure that this is not vascular related.  The patient also does have some lower back issues.  They are not vascular related we will have her follow-up with PCP for further work-up and evaluation.  3. Type 2 diabetes, diet controlled ([place]) Continue hypoglycemic medications as already ordered, these medications have been reviewed and there are no changes at this time.  Hgb A1C to be monitored as already arranged by primary service    Current Outpatient Medications on File Prior to Visit  Medication Sig Dispense Refill   acetaminophen (TYLENOL) 325 MG tablet Take 1,000 mg by mouth 2 (two) times daily.      albuterol (PROVENTIL HFA;VENTOLIN HFA) 108 (90 Base) MCG/ACT inhaler Inhale 2 puffs into the lungs every 6 (six) hours as needed for wheezing or shortness of breath. 1 Inhaler 0   atorvastatin (LIPITOR) 20 MG tablet Take 1 tablet by mouth daily.     Blood Glucose Monitoring Suppl (FIFTY50 GLUCOSE METER 2.0) w/Device KIT Use as directed BAYER CONTOUR NEXT METER DX: E11.9     CONTOUR NEXT TEST test strip      ergocalciferol (VITAMIN  D2) 1.25 MG (50000 UT) capsule Take 50,000 Units by mouth once a week.     Fluticasone-Umeclidin-Vilant (TRELEGY ELLIPTA) 100-62.5-25 MCG/INH AEPB Inhale into the lungs as needed.     furosemide (LASIX) 40 MG tablet Take one tablet by mouth once daily as needed for lower extremity edema     gabapentin (NEURONTIN) 300 MG capsule Take 300 mg by mouth at bedtime.     hydrocortisone (ANUSOL-HC) 2.5 % rectal cream Place 1 application rectally 2 (two) times daily.     magnesium oxide (MAG-OX) 400 MG tablet Take 800 mg by mouth daily.      nystatin cream (MYCOSTATIN) Apply 1 application topically 2 (two) times daily as needed. Apply thin film to yeast  rash under the breasts     Omega-3 Krill Oil 500 MG CAPS Take 500 mg by mouth 3 (three) times a week. On Monday Wednesday and Friday     omeprazole (PRILOSEC) 20 MG capsule Take 20 mg by mouth daily. Acid reflux      polyethylene glycol-electrolytes (NULYTELY) 420 g solution Take 4,000 mLs by mouth once.     sennosides-docusate sodium (SENOKOT-S) 8.6-50 MG tablet Take 2 tablets by mouth 2 (two) times daily.      solifenacin (VESICARE) 5 MG tablet Take 5 mg by mouth daily.     TOPROL XL 25 MG 24 hr tablet Take 25 mg by mouth at bedtime.     traMADol (ULTRAM) 50 MG tablet Take 50 mg by mouth every 6 (six) hours as needed.     triamterene-hydrochlorothiazide (DYAZIDE) 37.5-25 MG capsule Take 1 capsule by mouth daily.     WIXELA INHUB 250-50 MCG/ACT AEPB Inhale 1 puff into the lungs 2 (two) times daily.     No current facility-administered medications on file prior to visit.    There are no Patient Instructions on file for this visit. No follow-ups on file.   Kris Hartmann, NP

## 2022-08-02 ENCOUNTER — Encounter: Payer: Medicare Other | Attending: Physician Assistant | Admitting: Physician Assistant

## 2022-08-02 DIAGNOSIS — I130 Hypertensive heart and chronic kidney disease with heart failure and stage 1 through stage 4 chronic kidney disease, or unspecified chronic kidney disease: Secondary | ICD-10-CM | POA: Insufficient documentation

## 2022-08-02 DIAGNOSIS — E11622 Type 2 diabetes mellitus with other skin ulcer: Secondary | ICD-10-CM | POA: Insufficient documentation

## 2022-08-02 DIAGNOSIS — E1151 Type 2 diabetes mellitus with diabetic peripheral angiopathy without gangrene: Secondary | ICD-10-CM | POA: Diagnosis not present

## 2022-08-02 DIAGNOSIS — I890 Lymphedema, not elsewhere classified: Secondary | ICD-10-CM | POA: Diagnosis not present

## 2022-08-02 DIAGNOSIS — L97822 Non-pressure chronic ulcer of other part of left lower leg with fat layer exposed: Secondary | ICD-10-CM | POA: Insufficient documentation

## 2022-08-02 DIAGNOSIS — E1122 Type 2 diabetes mellitus with diabetic chronic kidney disease: Secondary | ICD-10-CM | POA: Diagnosis not present

## 2022-08-02 DIAGNOSIS — I5042 Chronic combined systolic (congestive) and diastolic (congestive) heart failure: Secondary | ICD-10-CM | POA: Diagnosis not present

## 2022-08-02 DIAGNOSIS — N183 Chronic kidney disease, stage 3 unspecified: Secondary | ICD-10-CM | POA: Diagnosis not present

## 2022-08-02 NOTE — Progress Notes (Addendum)
ZAKARIA, FROMER ([id number]) [phone].pdf Page 1 of 11 Visit Report for 08/02/2022 Arrival Information Details Patient Name: Date of Service: [place], [name] 08/02/2022 10:30 A M Medical Record Number: [id number] Patient Account Number: [id number] Date of Birth/Sex: Treating RN: 01/12/1941 (82 y.o. [place] Primary Care Damya Comley: Tracie Harrier Other Clinician: Massie Kluver Referring Ivana Nicastro: Treating Rylynne Schicker/Extender: Solon Palm Weeks in Treatment: 3 Visit Information History Since Last Visit All ordered tests and consults were completed: No Patient Arrived: Kasandra Knudsen Added or deleted any medications: No Arrival Time: 10:42 Any new allergies or adverse reactions: No Transfer Assistance: None Had a fall or experienced change in No Patient Identification Verified: Yes activities of daily living that may affect Secondary Verification Process Completed: Yes risk of falls: Patient Requires Transmission-Based Precautions: No Signs or symptoms of abuse/neglect since No Patient Has Alerts: Yes last visito Patient Alerts: 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 08/14/21 Hospitalized since last visit: No R 1.04 TBI .96 08/14/21 Implantable device outside of the clinic No excluding cellular tissue based products placed in the center since last visit: Has Dressing in Place as Prescribed: Yes Has Compression in Place as Prescribed: Yes Has Footwear/Offloading in Place as Yes Prescribed: Left: Surgical Shoe with Pressure Relief Insole Pain Present Now: No Electronic Signature(s) Signed: 08/06/2022 4:49:02 PM By: Massie Kluver Entered By: Massie Kluver on 08/02/2022 10:43:44 -------------------------------------------------------------------------------- Clinic Level of Care Assessment Details Patient Name: Date of Service: [place], [name] 08/02/2022 10:30 A M Medical Record Number: [id number] Patient Account Number: [id number] Date of Birth/Sex: Treating  RN: 12-19-1940 (82 y.o. [place] Primary Care Ji Fairburn: Tracie Harrier Other Clinician: Massie Kluver Referring Ameia Morency: Treating Pailynn Vahey/Extender: Solon Palm Weeks in Treatment: 3 Clinic Level of Care Assessment Items OMA, MARZAN ([id number]) [id number] Page 2 of 11 TOOL 1 Quantity Score '[]'$  - 0 Use when EandM and Procedure is performed on INITIAL visit ASSESSMENTS - Nursing Assessment / Reassessment '[]'$  - 0 General Physical Exam (combine w/ comprehensive assessment (listed just below) when performed on new pt. evals) '[]'$  - 0 Comprehensive Assessment (HX, ROS, Risk Assessments, Wounds Hx, etc.) ASSESSMENTS - Wound and Skin Assessment / Reassessment '[]'$  - 0 Dermatologic / Skin Assessment (not related to wound area) ASSESSMENTS - Ostomy and/or Continence Assessment and Care '[]'$  - 0 Incontinence Assessment and Management '[]'$  - 0 Ostomy Care Assessment and Management (repouching, etc.) PROCESS - Coordination of Care '[]'$  - 0 Simple Patient / Family Education for ongoing care '[]'$  - 0 Complex (extensive) Patient / Family Education for ongoing care '[]'$  - 0 Staff obtains Programmer, systems, Records, T Results / Process Orders est '[]'$  - 0 Staff telephones HHA, Nursing Homes / Clarify orders / etc '[]'$  - 0 Routine Transfer to another Facility (non-emergent condition) '[]'$  - 0 Routine Hospital Admission (non-emergent condition) '[]'$  - 0 New Admissions / Biomedical engineer / Ordering NPWT Apligraf, etc. , '[]'$  - 0 Emergency Hospital Admission (emergent condition) PROCESS - Special Needs '[]'$  - 0 Pediatric / Minor Patient Management '[]'$  - 0 Isolation Patient Management '[]'$  - 0 Hearing / Language / Visual special needs '[]'$  - 0 Assessment of Community assistance (transportation, D/C planning, etc.) '[]'$  - 0 Additional assistance / Altered mentation '[]'$  - 0 Support Surface(s) Assessment (bed, cushion, seat, etc.) INTERVENTIONS -  Miscellaneous '[]'$  - 0 External ear exam '[]'$  - 0 Patient Transfer (multiple staff / Civil Service fast streamer / Similar devices) '[]'$  - 0 Simple Staple / Suture removal (25 or less) '[]'$  - 0 Complex  Staple / Suture removal (26 or more) '[]'$  - 0 Hypo/Hyperglycemic Management (do not check if billed separately) '[]'$  - 0 Ankle / Brachial Index (ABI) - do not check if billed separately Has the patient been seen at the hospital within the last three years: Yes Total Score: 0 Level Of Care: ____ Electronic Signature(s) Signed: 08/06/2022 4:49:02 PM By: Massie Kluver Entered By: Massie Kluver on 08/02/2022 11:09:49 [place], [place] ([id number]) [id number] Page 3 of 11 -------------------------------------------------------------------------------- Compression Therapy Details Patient Name: Date of Service: [place], [name] 08/02/2022 10:30 A M Medical Record Number: [id number] Patient Account Number: [id number] Date of Birth/Sex: Treating RN: Jul 20, 1940 (82 y.o. [place] Primary Care Karalee Hauter: Tracie Harrier Other Clinician: Massie Kluver Referring Beckham Buxbaum: Treating Tomislav Micale/Extender: Solon Palm Weeks in Treatment: 3 Compression Therapy Performed for Wound Assessment: Wound #2 Left,Lateral Lower Leg Performed By: Lenice Pressman, Angie, Compression Type: Rolena Infante Pre Treatment ABI: 1.1 Post Procedure Diagnosis Same as Pre-procedure Electronic Signature(s) Signed: 08/06/2022 4:49:02 PM By: Massie Kluver Entered By: Massie Kluver on 08/02/2022 11:09:28 -------------------------------------------------------------------------------- Encounter Discharge Information Details Patient Name: Date of Service: [name], [name] 08/02/2022 10:30 A M Medical Record Number: [id number] Patient Account Number: [id number] Date of Birth/Sex: Treating RN: [birth year] (82 y.o. [place] Primary Care Kable Haywood: Tracie Harrier Other Clinician: Massie Kluver Referring Lillieanna Tuohy: Treating Chenelle Benning/Extender: Solon Palm Weeks in Treatment: 3 Encounter Discharge Information Items Post Procedure Vitals Discharge Condition: Stable Temperature (F): 97.8 Ambulatory Status: Cane Pulse (bpm): 67 Discharge Destination: Home Respiratory Rate (breaths/min): 18 Transportation: Private Auto Blood Pressure (mmHg): 163/76 Accompanied By: self Schedule Follow-up Appointment: Yes Clinical Summary of Care: Electronic Signature(s) Signed: 08/06/2022 4:49:02 PM By: Massie Kluver Entered By: Massie Kluver on 08/02/2022 11:26:13 -------------------------------------------------------------------------------- Lower Extremity Assessment Details Patient Name: Date of Service: [name], [name] 08/02/2022 10:30 A TANAJAH BOULTER, Kelliann P ([id number]) [id number] Page 4 of 11 Medical Record Number: [id number] Patient Account Number: [id number] Date of Birth/Sex: Treating RN: May 11, 1941 (82 y.o. [place] Primary Care Lataysha Vohra: Tracie Harrier Other Clinician: Massie Kluver Referring Resean Brander: Treating Zanai Mallari/Extender: Solon Palm Weeks in Treatment: 3 Edema Assessment Assessed: [Left: Yes] [Right: No] Edema: [Left: Ye] [Right: s] Calf Left: Right: Point of Measurement: 34 cm From Medial Instep 41.2 cm Ankle Left: Right: Point of Measurement: 12 cm From Medial Instep 25 cm Vascular Assessment Pulses: Dorsalis Pedis Palpable: [Left:Yes] Electronic Signature(s) Signed: 08/02/2022 4:09:44 PM By: Gretta Cool, BSN, RN, CWS, Kim RN, BSN Signed: 08/06/2022 4:49:02 PM By: Massie Kluver Entered By: Massie Kluver on 08/02/2022 10:59:36 -------------------------------------------------------------------------------- Multi Wound Chart Details Patient Name: Date of Service: [name], [name] 08/02/2022 10:30 A M Medical Record Number: [id number] Patient Account Number: [id number] Date of Birth/Sex:  Treating RN: 1940-12-31 (82 y.o. 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/A Primary Etiology: Extremity Extremity Lymphedema, Congestive Heart Lymphedema, Congestive Heart N/A Comorbid History: Failure, Hypertension, Type II Failure, Hypertension, Type II Diabetes Diabetes 05/01/2022 05/01/2022 N/A Date Acquired: 3 3 N/A Weeks of Treatment: Open Open  N/A Wound Status: No No N/A Wound Recurrence: 0.2x0.2x0.1 1.2x1.2x0.2 N/A Measurements L x W x D (cm) 0.031 1.131 N/A A (cm) : rea 0.003 0.226 N/A Volume (cm) : 99.60% 60.00% N/A % Reduction in A rea: 99.60% 60.00% N/A % Reduction in Volume: Grade 2 Grade 2 N/A Classification: Medium Medium N/A Exudate A mount: Serosanguineous Serosanguineous N/A Exudate Type: red, brown red, brown N/A Exudate Color: None Present (0%) None Present (0%) N/A Granulation A mount: Large (67-100%) Large (67-100%) N/A Necrotic A mount: Eschar, Adherent Slough Adherent Slough N/A Necrotic Tissue: Fascia: No Fascia: No N/A Exposed Structures: Fat Layer (Subcutaneous Tissue): No Fat Layer (Subcutaneous Tissue): No Tendon: No Tendon: No Muscle: No Muscle: No Joint: No Joint: No Bone: No Bone: No None None N/A Epithelialization: Treatment Notes Electronic Signature(s) Signed: 08/06/2022 4:49:02 PM By: Massie Kluver Entered By: Massie Kluver on 08/02/2022 10:59:53 -------------------------------------------------------------------------------- Multi-Disciplinary Care Plan Details Patient Name: Date of Service: [facility] [place], [name] 08/02/2022 10:30 A M Medical Record Number: [id number] Patient Account Number: [id number] Date of Birth/Sex: Treating RN: 07-24-1940 (82 y.o. [place] Primary Care Adolphe Fortunato: Tracie Harrier Other Clinician: Massie Kluver Referring Serjio Deupree: Treating Corwin Kuiken/Extender: Solon Palm Weeks in Treatment: 3 Active Inactive Necrotic Tissue Nursing Diagnoses: Knowledge deficit related to management of necrotic/devitalized tissue Goals: Patient/caregiver will verbalize understanding of reason and process for debridement of necrotic tissue Date Initiated: 07/12/2022 Target Resolution Date: 08/12/2022 Goal Status: Active Interventions: Assess patient pain level pre-, during and post procedure and prior to discharge Provide education on necrotic tissue and debridement process Notes: SHARAYA, BORUFF ([id number]) [id number] Page 6 of 11 Wound/Skin Impairment Nursing Diagnoses: Knowledge deficit related to ulceration/compromised skin integrity Goals: Patient/caregiver will verbalize understanding of skin care regimen Date Initiated: 07/12/2022 Target Resolution Date: 08/12/2022 Goal Status: Active Ulcer/skin breakdown will have a volume reduction of 30% by week 4 Date Initiated: 07/12/2022 Target Resolution Date: 08/12/2022 Goal Status: Active Ulcer/skin breakdown will have a volume reduction of 50% by week 8 Date Initiated: 07/12/2022 Target Resolution Date: 09/10/2022 Goal Status: Active Ulcer/skin breakdown will have a volume reduction of 80% by week 12 Date Initiated: 07/12/2022 Target Resolution Date: 10/11/2022 Goal Status: Active Ulcer/skin breakdown will heal within 14 weeks Date Initiated: 07/12/2022 Target  Resolution Date: 11/10/2022 Goal Status: Active Interventions: Assess patient/caregiver ability to obtain necessary supplies Assess patient/caregiver ability to perform ulcer/skin care regimen upon admission and as needed Assess ulceration(s) every visit Notes: Electronic Signature(s) Signed: 08/02/2022 4:09:44 PM By: Gretta Cool, BSN, RN, CWS, Kim RN, BSN Signed: 08/06/2022 4:49:02 PM By: Massie Kluver Entered By: Massie Kluver on 08/02/2022 11:25:22 -------------------------------------------------------------------------------- Pain Assessment Details Patient Name: Date of Service: [name], A Dreama Saa. 08/02/2022 10:30 A M Medical Record Number: [id number] Patient Account Number: [id number] Date of Birth/Sex: Treating RN: 1941-05-27 (82 y.o. [place] Primary Care Ivori Storr: Tracie Harrier Other Clinician: Massie Kluver Referring Audris Speaker: Treating Sourish Allender/Extender: Solon Palm Weeks in Treatment: 3 Active Problems Location of Pain Severity and Description of Pain Patient Has Paino No Site Locations [place], Amaura P ([id number]) [phone].pdf Page 7 of 11 Pain Management and Medication Current Pain Management: Electronic Signature(s) Signed: 08/02/2022 4:09:44 PM By: Gretta Cool, BSN, RN, CWS, Kim RN, BSN Signed: 08/06/2022 4:49:02 PM By: Massie Kluver Entered By: Massie Kluver on 08/02/2022 10:45:59 -------------------------------------------------------------------------------- Patient/Caregiver Education Details Patient Name: Date of Service: [facility] [place], [name] 2/2/2024andnbsp10:30 [place] Record Number: [id number] Patient Account Number: [id number] Date of Birth/Gender: Treating RN: 09/23/1940 (81 y.o.  [place] Primary Care Physician: Tracie Harrier Other Clinician: Massie Kluver Referring Physician: Treating Physician/Extender: Solon Palm Weeks in Treatment: 3 Education Assessment Education Provided  To: Patient Education Topics Provided Wound/Skin Impairment: Handouts: Other: continue wound care as directed Methods: Explain/Verbal Responses: State content correctly Electronic Signature(s) Signed: 08/06/2022 4:49:02 PM By: Massie Kluver Entered By: Massie Kluver on 08/02/2022 11:10:14 Klaiber, Marialice P ([id number]) [id number] Page 8 of 11 -------------------------------------------------------------------------------- Wound Assessment Details Patient Name: Date of Service: [place], [name] 08/02/2022 10:30 A M Medical Record Number: [id number] Patient Account Number: [id number] Date of Birth/Sex: Treating RN: March 26, 1941 (82 y.o. Charolette Forward, Kim Primary Care Mariaeduarda Defranco: Tracie Harrier Other Clinician: Massie Kluver Referring Jenavive Lamboy: Treating Lidiya Reise/Extender: Solon Palm Weeks in Treatment: 3 Wound Status Wound Number: 1 Primary Diabetic Wound/Ulcer of the Lower Extremity Etiology: Wound Location: Left, Anterior Lower Leg Wound Status: Open Wounding Event: Gradually Appeared Comorbid Lymphedema, Congestive Heart Failure, Hypertension, Type Date Acquired: 05/01/2022 History: II Diabetes Weeks Of Treatment: 3 Clustered Wound: No Photos Wound Measurements Length: (cm) 0.2 Width: (cm) 0.2 Depth: (cm) 0.1 Area: (cm) 0.031 Volume: (cm) 0.003 % Reduction in Area: 99.6% % Reduction in Volume: 99.6% Epithelialization: None Wound Description Classification: Grade 2 Exudate Amount: Medium Exudate Type: Serosanguineous Exudate Color: red, brown Foul Odor After Cleansing: No Slough/Fibrino Yes Wound Bed Granulation Amount: None Present (0%) Exposed Structure Necrotic Amount: Large (67-100%) Fascia Exposed: No Necrotic Quality: Eschar, Adherent Slough Fat Layer (Subcutaneous Tissue) Exposed: No Tendon Exposed: No Muscle Exposed: No Joint Exposed: No Bone Exposed: No Treatment Notes Wound #1 (Lower Leg) Wound  Laterality: Left, Anterior Cleanser Soap and Water Discharge Instruction: Gently cleanse wound with antibacterial soap, rinse and pat dry prior to dressing wounds Peri-Wound Care Gonsalez, Ellora P ([id number]) [id number] Page 9 of 11 Topical Primary Dressing Hydrofera Blue Ready Transfer Foam, 2.5x2.5 (in/in) Discharge Instruction: Apply Hydrofera Blue Ready to wound bed as directed Secondary Dressing ABD Pad 5x9 (in/in) Discharge Instruction: Cover with ABD pad Gauze Discharge Instruction: As directed: dry, moistened with saline or moistened with Dakins Solution Secured With Compression Wrap UNNA BOOT - Unna-Z Zinc and Calamine Boot, 4x10 (in/yd) Discharge Instruction: Use to anchor wrap in place. Apply one strip around leg 3-finger widths below the knee to secure the wrap. Compression Stockings Add-Ons Electronic Signature(s) Signed: 08/02/2022 4:09:44 PM By: Gretta Cool, BSN, RN, CWS, Kim RN, BSN Signed: 08/06/2022 4:49:02 PM By: Massie Kluver Entered By: Massie Kluver on 08/02/2022 10:54:49 -------------------------------------------------------------------------------- Wound Assessment Details Patient Name: Date of Service: [name], [name] 08/02/2022 10:30 A M Medical Record Number: [id number] Patient Account Number: [id number] Date of Birth/Sex: Treating RN: 01-Nov-1940 (82 y.o. Charolette Forward, Kim Primary Care Coral Soler: Tracie Harrier Other Clinician: Massie Kluver Referring Buster Schueller: Treating Adisynn Suleiman/Extender: Solon Palm Weeks in Treatment: 3 Wound Status Wound Number: 2 Primary Diabetic Wound/Ulcer of the Lower Extremity Etiology: Wound Location: Left, Lateral Lower Leg Wound Status: Open Wounding Event: Gradually Appeared Comorbid Lymphedema, Congestive Heart Failure, Hypertension, Type Date Acquired: 05/01/2022 History: II Diabetes Weeks Of Treatment: 3 Clustered Wound: No Photos Wound Measurements Length: (cm) 1.2 Width:  (cm) 1.2 Gidney, Krisalyn P ([id number]) Depth: (cm) 0.2 Area: (cm) 1.131 Volume: (cm) 0.226 % Reduction in Area: 60% % Reduction in Volume: 60% [id number] Page 10 of 11 Epithelialization: None Wound Description Classification: Grade 2 Exudate Amount: Medium Exudate Type: Serosanguineous Exudate Color: red, brown Foul Odor After Cleansing: No Slough/Fibrino Yes Wound Bed Granulation Amount: None Present (0%) Exposed Structure  Necrotic Amount: Large (67-100%) Fascia Exposed: No Necrotic Quality: Adherent Slough Fat Layer (Subcutaneous Tissue) Exposed: No Tendon Exposed: No Muscle Exposed: No Joint Exposed: No Bone Exposed: No Treatment Notes Wound #2 (Lower Leg) Wound Laterality: Left, Lateral Cleanser Soap and Water Discharge Instruction: Gently cleanse wound with antibacterial soap, rinse and pat dry prior to dressing wounds Peri-Wound Care Topical Primary Dressing Hydrofera Blue Ready Transfer Foam, 2.5x2.5 (in/in) Discharge Instruction: Apply Hydrofera Blue Ready to wound bed as directed Secondary Dressing ABD Pad 5x9 (in/in) Discharge Instruction: Cover with ABD pad Gauze Discharge Instruction: As directed: dry, moistened with saline or moistened with Dakins Solution Secured With Compression Wrap UNNA BOOT - Unna-Z Zinc and Calamine Boot, 4x10 (in/yd) Discharge Instruction: Use to anchor wrap in place. Apply one strip around leg 3-finger widths below the knee to secure the wrap. Compression Stockings Environmental education officer) Signed: 08/02/2022 4:09:44 PM By: Gretta Cool, BSN, RN, CWS, Kim RN, BSN Signed: 08/06/2022 4:49:02 PM By: Massie Kluver Entered By: Massie Kluver on 08/02/2022 10:55:45 -------------------------------------------------------------------------------- Vitals Details Patient Name: Date of Service: [name], [name] 08/02/2022 10:30 A M Medical Record Number: [id number] Patient Account Number: [id number] Date of  Birth/Sex: Treating RN: 1941/04/29 (82 y.o. [place] Primary Care Callista Hoh: Tracie Harrier Other Clinician: Shandrell, Boda ([id number]) [id number] Page 11 of 11 Referring Zyir Gassert: Treating Yessika Otte/Extender: Nadara Mustard, Vishwanath Weeks in Treatment: 3 Vital Signs Time Taken: 10:44 Temperature (F): 97.8 Height (in): 69 Pulse (bpm): 67 Weight (lbs): 250 Respiratory Rate (breaths/min): 16 Body Mass Index (BMI): 36.9 Blood Pressure (mmHg): 163/76 Reference Range: 80 - 120 mg / dl Electronic Signature(s) Signed: 08/06/2022 4:49:02 PM By: Massie Kluver Entered By: Massie Kluver on 08/02/2022 10:45:56

## 2022-08-02 NOTE — Progress Notes (Signed)
MADELIN, [name] ([id number]) [id number] Page 1 of 8 Visit Report for 08/02/2022 Chief Complaint Document Details Patient Name: Date of Service: [name], [name] 08/02/2022 10:30 A M Medical Record Number: [id number] Patient Account Number: [id number] Date of Birth/Sex: Treating RN: [birth year] (82 y.o. [place] Primary Care Provider: Tracie Harrier Other Clinician: Massie Kluver Referring Provider: Treating Provider/Extender: Solon Palm Weeks in Treatment: 3 Information Obtained from: Patient Chief Complaint Left LE Ulcers Electronic Signature(s) Signed: 08/02/2022 10:38:33 AM By: Worthy Keeler PA-C Entered By: Worthy Keeler on 08/02/2022 10:38:33 -------------------------------------------------------------------------------- Debridement Details Patient Name: Date of Service: Wanda, [name] 08/02/2022 10:30 A M Medical Record Number: [id number] Patient Account Number: [id number] Date of Birth/Sex: Treating RN: Jan 04, 1941 (82 y.o. [place] Primary Care Provider: Tracie Harrier Other Clinician: Massie Kluver Referring Provider: Treating Provider/Extender: Solon Palm Weeks in Treatment: 3 Debridement Performed for Assessment: Wound #2 Left,Lateral Lower Leg Performed By: Physician Tommie Sams., PA-C Debridement Type: Debridement Severity of Tissue Pre Debridement: Fat layer exposed Level of Consciousness (Pre-procedure): Awake and Alert Pre-procedure Verification/Time Out Yes - 11:06 Taken: Start Time: 11:06 T Area Debrided (L x W): otal 1.5 (cm) x 1.2 (cm) = 1.8 (cm) Tissue and other material debrided: Viable, Non-Viable, Slough, Subcutaneous, Slough Level: Skin/Subcutaneous Tissue Debridement Description: Excisional Instrument: Curette Bleeding: Minimum Hemostasis Achieved: Pressure Response to Treatment: Procedure was tolerated well Level of Consciousness (Post- Awake and  Alert procedure): LALONI, ROWTON P ([id number]) [id number] Page 2 of 8 Post Debridement Measurements of Total Wound Length: (cm) 1.2 Width: (cm) 1.2 Depth: (cm) 0.2 Volume: (cm) 0.226 Character of Wound/Ulcer Post Debridement: Stable Severity of Tissue Post Debridement: Fat layer exposed Post Procedure Diagnosis Same as Pre-procedure Electronic Signature(s) Signed: 08/02/2022 2:41:09 PM By: Worthy Keeler PA-C Signed: 08/02/2022 4:09:44 PM By: Gretta Cool BSN, RN, CWS, Kim RN, BSN Signed: 08/06/2022 4:49:02 PM By: Massie Kluver Entered By: Massie Kluver on 08/02/2022 11:08:53 -------------------------------------------------------------------------------- HPI Details Patient Name: Date of Service: [name], [name] 08/02/2022 10:30 A M Medical Record Number: [id number] Patient Account Number: [id number] Date of Birth/Sex: Treating RN: 1941/04/11 (82 y.o. [place] Primary Care Provider: Tracie Harrier Other Clinician: Massie Kluver Referring Provider: Treating Provider/Extender: Solon Palm Weeks in Treatment: 3 History of Present Illness HPI Description: 07-12-2022 upon evaluation today patient presents for initial inspection concerning issues she has been having with wounds on her left lower extremity. She thinks she may have hit the lateral portion of her leg on the dishwasher door but the other she thinks may have been a tape injury. It is more of a blister type situation. With that being said she tells me that she is having some discomfort although is not terrible at this point. Fortunately I do not see any signs of infection locally or systemically at this time which is great news. No fevers, chills, nausea, vomiting, or diarrhea. Patient does have a history of diabetes mellitus type 2, lymphedema for which she is not really able to wear compression socks that she tells me she cannot get them on. She also has hypertension,  congestive heart failure, and chronic kidney disease stage III. 07-26-2022 upon evaluation today patient appears to be doing well currently in regard to her wound. The compression wrapping does seem to be helping I am good I perform some debridement in regard to the left lateral leg but the other wound is actually looking quite well. I think we are  headed in the right direction. Of note she did put her compression wrap down she was stating that this was getting too tight. 08-02-2022 upon evaluation today patient appears to be doing well currently in regard to her wound. She is actually showing signs of improvement in regard to both and I am extremely pleased with where we stand today. I do not see any evidence of active infection locally nor systemically which is great news. No fevers, chills, nausea, vomiting, or diarrhea. Electronic Signature(s) Signed: 08/02/2022 11:17:28 AM By: Worthy Keeler PA-C Entered By: Worthy Keeler on 08/02/2022 11:17:28 Duffee, Derrill Memo ([id number]) [id number] Page 3 of 8 -------------------------------------------------------------------------------- Physical Exam Details Patient Name: Date of Service: Wanda, [name] 08/02/2022 10:30 A M Medical Record Number: [id number] Patient Account Number: [id number] Date of Birth/Sex: Treating RN: 1941-01-18 (82 y.o. [place] Primary Care Provider: Tracie Harrier Other Clinician: Massie Kluver Referring Provider: Treating Provider/Extender: Solon Palm Weeks in Treatment: 3 Constitutional Well-nourished and well-hydrated in no acute distress. Respiratory normal breathing without difficulty. Psychiatric this patient is able to make decisions and demonstrates good insight into disease process. Alert and Oriented x 3. pleasant and cooperative. Notes Upon inspection patient's wound bed actually showed signs of good granulation epithelization at this point.  Fortunately there does not appear to be any signs of infection at this time which is great news and overall I am extremely pleased with where we stand. I did perform debridement to clearway some of the necrotic debris she tolerated that today without complication postdebridement this is significantly improved. Electronic Signature(s) Signed: 08/02/2022 11:19:17 AM By: Worthy Keeler PA-C Entered By: Worthy Keeler on 08/02/2022 11:19:17 -------------------------------------------------------------------------------- Physician Orders Details Patient Name: Date of Service: [name], [name] 08/02/2022 10:30 A M Medical Record Number: [id number] Patient Account Number: [id number] Date of Birth/Sex: Treating RN: 21-Aug-1940 (82 y.o. Charolette Forward, Kim Primary Care Provider: Tracie Harrier Other Clinician: Massie Kluver Referring Provider: Treating Provider/Extender: Solon Palm Weeks in Treatment: 3 Verbal / Phone Orders: No Diagnosis Coding ICD-10 Coding Code Description E11.622 Type 2 diabetes mellitus with other skin ulcer I89.0 Lymphedema, not elsewhere classified L97.822 Non-pressure chronic ulcer of other part of left lower leg with fat layer exposed I10 Essential (primary) hypertension I50.42 Chronic combined systolic (congestive) and diastolic (congestive) heart failure N18.30 Chronic kidney disease, stage 3 unspecified Guedes, Krithika P ([id number]) [id number] Page 4 of 8 Follow-up Appointments Return Appointment in 1 week. Bathing/ Shower/ Hygiene May shower with wound dressing protected with water repellent cover or cast protector. Anesthetic (Use 'Patient Medications' Section for Anesthetic Order Entry) Lidocaine applied to wound bed Edema Control - Lymphedema / Segmental Compressive Device / Other Unna Boot for Lymphedema Elevate, Exercise Daily and A void Standing for Long Periods of Time. Elevate legs to the level of the heart  and pump ankles as often as possible Elevate leg(s) parallel to the floor when sitting. Wound Treatment Wound #1 - Lower Leg Wound Laterality: Left, Anterior Cleanser: Soap and Water 1 x Per Week/30 Days Discharge Instructions: Gently cleanse wound with antibacterial soap, rinse and pat dry prior to dressing wounds Prim Dressing: Hydrofera Blue Ready Transfer Foam, 2.5x2.5 (in/in) 1 x Per Week/30 Days ary Discharge Instructions: Apply Hydrofera Blue Ready to wound bed as directed Secondary Dressing: ABD Pad 5x9 (in/in) 1 x Per Week/30 Days Discharge Instructions: Cover with ABD pad Secondary Dressing: Gauze 1 x Per Week/30 Days Discharge Instructions: As directed: dry, moistened with  saline or moistened with Dakins Solution Compression Wrap: UNNA BOOT - Unna-Z Zinc and Calamine Boot, 4x10 (in/yd) 1 x Per Week/30 Days Discharge Instructions: Use to anchor wrap in place. Apply one strip around leg 3-finger widths below the knee to secure the wrap. Wound #2 - Lower Leg Wound Laterality: Left, Lateral Cleanser: Soap and Water 1 x Per Week/30 Days Discharge Instructions: Gently cleanse wound with antibacterial soap, rinse and pat dry prior to dressing wounds Prim Dressing: Hydrofera Blue Ready Transfer Foam, 2.5x2.5 (in/in) 1 x Per Week/30 Days ary Discharge Instructions: Apply Hydrofera Blue Ready to wound bed as directed Secondary Dressing: ABD Pad 5x9 (in/in) 1 x Per Week/30 Days Discharge Instructions: Cover with ABD pad Secondary Dressing: Gauze 1 x Per Week/30 Days Discharge Instructions: As directed: dry, moistened with saline or moistened with Dakins Solution Compression Wrap: UNNA BOOT - Unna-Z Zinc and Calamine Boot, 4x10 (in/yd) 1 x Per Week/30 Days Discharge Instructions: Use to anchor wrap in place. Apply one strip around leg 3-finger widths below the knee to secure the wrap. Electronic Signature(s) Signed: 08/02/2022 2:41:09 PM By: Worthy Keeler PA-C Signed: 08/06/2022 4:49:02 PM  By: Massie Kluver Entered By: Massie Kluver on 08/02/2022 11:09:42 -------------------------------------------------------------------------------- Problem List Details Patient Name: Date of Service: [name], [name] 08/02/2022 10:30 A M Medical Record Number: [id number] Patient Account Number: [id number] Date of Birth/Sex: Treating RN: Mar 24, 1941 (82 y.o. 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 08/02/2022 10:30 A M Medical Record Number: [id number] Patient Account Number: [id number] Date of Birth/Sex: Treating RN: 09/25/1940 (82 y.o. [place] Primary Care Provider: Tracie Harrier Other Clinician: Massie Kluver Referring Provider: Treating Provider/Extender: Solon Palm Weeks in Treatment: 3 Subjective Chief Complaint Information obtained from Patient Left LE Ulcers History of  Present Illness (HPI) 07-12-2022 upon evaluation today patient presents for initial inspection concerning issues she has been having with wounds on her left lower extremity. She thinks she may have hit the lateral portion of her leg on the dishwasher door but the other she thinks may have been a tape injury. It is more of a blister type situation. With that being said she tells me that she is having some discomfort although is not terrible at this point. Fortunately I do not see any signs of infection locally or systemically at this time which is great news. No fevers, chills, nausea, vomiting, or diarrhea. LOYE, REININGER ([id number]) [id number] Page 6 of 8 Patient does have a history of diabetes mellitus type 2, lymphedema for which she is not really able to wear compression socks that she tells me she cannot get them on. She also has hypertension, congestive heart failure, and  chronic kidney disease stage III. 07-26-2022 upon evaluation today patient appears to be doing well currently in regard to her wound. The compression wrapping does seem to be helping I am good I perform some debridement in regard to the left lateral leg but the other wound is actually looking quite well. I think we are headed in the right direction. Of note she did put her compression wrap down she was stating that this was getting too tight. 08-02-2022 upon evaluation today patient appears to be doing well currently in regard to her wound. She is actually showing signs of improvement in regard to both and I am extremely pleased with where we stand today. I do not see any evidence of active infection locally nor systemically which is great news. No fevers, chills, nausea, vomiting, or diarrhea. Objective Constitutional Well-nourished and well-hydrated in no acute distress. Vitals Time Taken: 10:44 AM, Height: 69 in, Weight: 250 lbs, BMI: 36.9, Temperature: 97.8 F, Pulse: 67 bpm, Respiratory Rate:  16 breaths/min, Blood Pressure: 163/76 mmHg. Respiratory normal breathing without difficulty. Psychiatric this patient is able to make decisions and demonstrates good insight into disease process. Alert and Oriented x 3. pleasant and cooperative. General Notes: Upon inspection patient's wound bed actually showed signs of good granulation epithelization at this point. Fortunately there does not appear to be any signs of infection at this time which is great news and overall I am extremely pleased with where we stand. I did perform debridement to clearway some of the necrotic debris she tolerated that today without complication postdebridement this is significantly improved. Integumentary (Hair, Skin) Wound #1 status is Open. Original cause of wound was Gradually Appeared. The date acquired was: 05/01/2022. The wound has been in treatment 3 weeks. The wound is located on the Left,Anterior Lower Leg. The wound measures 0.2cm length x 0.2cm width x 0.1cm depth; 0.031cm^2 area and 0.003cm^3 volume. There is a medium amount of serosanguineous drainage noted. There is no granulation within the wound bed. There is a large (67-100%) amount of necrotic tissue within the wound bed including Eschar and Adherent Slough. Wound #2 status is Open. Original cause of wound was Gradually Appeared. The date acquired was: 05/01/2022. The wound has been in treatment 3 weeks. The wound is located on the Left,Lateral Lower Leg. The wound measures 1.2cm length x 1.2cm width x 0.2cm depth; 1.131cm^2 area and 0.226cm^3 volume. There is a medium amount of serosanguineous drainage noted. There is no granulation within the wound bed. There is a large (67-100%) amount of necrotic tissue within the wound bed including Adherent Slough. Assessment Active Problems ICD-10 Type 2 diabetes mellitus with other skin ulcer Lymphedema, not elsewhere classified Non-pressure chronic ulcer of other part of left lower leg with fat layer  exposed Essential (primary) hypertension Chronic combined systolic (congestive) and diastolic (congestive) heart failure Chronic kidney disease, stage 3 unspecified Procedures Wound #2 Pre-procedure diagnosis of Wound #2 is a Diabetic Wound/Ulcer of the Lower Extremity located on the Left,Lateral Lower Leg .Severity of Tissue Pre Debridement is: Fat layer exposed. There was a Excisional Skin/Subcutaneous Tissue Debridement with a total area of 1.8 sq cm performed by Tommie Sams., PA-C. With the following instrument(s): Curette to remove Viable and Non-Viable tissue/material. Material removed includes Subcutaneous Tissue and Slough and. A time out was conducted at 11:06, prior to the start of the procedure. A Minimum amount of bleeding was controlled with Pressure. The procedure was tolerated well. Post Debridement Measurements: 1.2cm length x 1.2cm width x 0.2cm  depth; 0.226cm^3 volume. Character of Wound/Ulcer Post Debridement is stable. Severity of Tissue Post Debridement is: Fat layer exposed. Post procedure Diagnosis Wound #2: Same as Pre-Procedure Pre-procedure diagnosis of Wound #2 is a Diabetic Wound/Ulcer of the Lower Extremity located on the Left,Lateral Lower Leg . There was a Haematologist Compression Therapy Procedure with a pre-treatment ABI of 1.1 by Massie Kluver. Post procedure Diagnosis Wound #2: Same as Pre-Procedure LAKARA, WEILAND P ([id number]) [id number] Page 7 of 8 Plan Follow-up Appointments: Return Appointment in 1 week. Bathing/ Shower/ Hygiene: May shower with wound dressing protected with water repellent cover or cast protector. Anesthetic (Use 'Patient Medications' Section for Anesthetic Order Entry): Lidocaine applied to wound bed Edema Control - Lymphedema / Segmental Compressive Device / Other: Unna Boot for Lymphedema Elevate, Exercise Daily and Avoid Standing for Long Periods of Time. Elevate legs to the level of the heart and  pump ankles as often as possible Elevate leg(s) parallel to the floor when sitting. WOUND #1: - Lower Leg Wound Laterality: Left, Anterior Cleanser: Soap and Water 1 x Per Week/30 Days Discharge Instructions: Gently cleanse wound with antibacterial soap, rinse and pat dry prior to dressing wounds Prim Dressing: Hydrofera Blue Ready Transfer Foam, 2.5x2.5 (in/in) 1 x Per Week/30 Days ary Discharge Instructions: Apply Hydrofera Blue Ready to wound bed as directed Secondary Dressing: ABD Pad 5x9 (in/in) 1 x Per Week/30 Days Discharge Instructions: Cover with ABD pad Secondary Dressing: Gauze 1 x Per Week/30 Days Discharge Instructions: As directed: dry, moistened with saline or moistened with Dakins Solution Com pression Wrap: UNNA BOOT - Unna-Z Zinc and Calamine Boot, 4x10 (in/yd) 1 x Per Week/30 Days Discharge Instructions: Use to anchor wrap in place. Apply one strip around leg 3-finger widths below the knee to secure the wrap. WOUND #2: - Lower Leg Wound Laterality: Left, Lateral Cleanser: Soap and Water 1 x Per Week/30 Days Discharge Instructions: Gently cleanse wound with antibacterial soap, rinse and pat dry prior to dressing wounds Prim Dressing: Hydrofera Blue Ready Transfer Foam, 2.5x2.5 (in/in) 1 x Per Week/30 Days ary Discharge Instructions: Apply Hydrofera Blue Ready to wound bed as directed Secondary Dressing: ABD Pad 5x9 (in/in) 1 x Per Week/30 Days Discharge Instructions: Cover with ABD pad Secondary Dressing: Gauze 1 x Per Week/30 Days Discharge Instructions: As directed: dry, moistened with saline or moistened with Dakins Solution Com pression Wrap: UNNA BOOT - Unna-Z Zinc and Calamine Boot, 4x10 (in/yd) 1 x Per Week/30 Days Discharge Instructions: Use to anchor wrap in place. Apply one strip around leg 3-finger widths below the knee to secure the wrap. 1. I am good recommend that we have the patient continue to monitor for any signs of infection or worsening. Based on what I  am seeing I do believe we are headed in the right direction. 2. Postdebridement this looks better we will get a continue with the Unna boot wrap were also going to continue with the [facility] which I think is doing a good job. We will see patient back for reevaluation in 1 week here in the clinic. If anything worsens or changes patient will contact our office for additional recommendations. Our goal is to get her healed by the beginning of April for sure as she has a trip towards the middle of April where she hopes to not have to be in any type of wrap or treatment. Electronic Signature(s) Signed: 08/02/2022 11:19:49 AM By: Worthy Keeler PA-C Entered By: Worthy Keeler on 08/02/2022 11:19:48 -------------------------------------------------------------------------------- SuperBill  Details Patient Name: Date of Service: [place] 08/02/2022 Medical Record Number: [id number] Patient Account Number: [id number] Date of Birth/Sex: Treating RN: 1941/03/09 (82 y.o. [place] Primary Care Provider: Tracie Harrier Other Clinician: Massie Kluver Referring Provider: Treating Provider/Extender: Solon Palm Weeks in Treatment: 3 Diagnosis Coding ICD-10 Codes TAYTEN, HEBER ([id number]) [id number] Page 8 of 8 Code Description E11.622 Type 2 diabetes mellitus with other skin ulcer I89.0 Lymphedema, not elsewhere classified L97.822 Non-pressure chronic ulcer of other part of left lower leg with fat layer exposed I10 Essential (primary) hypertension I50.42 Chronic combined systolic (congestive) and diastolic (congestive) heart failure N18.30 Chronic kidney disease, stage 3 unspecified Facility Procedures : CPT4 Code: [id number] Description: 94707 - DEB SUBQ TISSUE 20 SQ CM/< ICD-10 Diagnosis Description L97.822 Non-pressure chronic ulcer of other part of left lower leg with fat layer expo Modifier: sed Quantity: 1 Physician  Procedures : CPT4 Code Description Modifier [id number] 11042 - WC PHYS SUBQ TISS 20 SQ CM ICD-10 Diagnosis Description L97.822 Non-pressure chronic ulcer of other part of left lower leg with fat layer exposed Quantity: 1 Electronic Signature(s) Signed: 08/02/2022 11:19:58 AM By: Worthy Keeler PA-C Entered By: Worthy Keeler on 08/02/2022 11:19:58

## 2022-08-08 ENCOUNTER — Other Ambulatory Visit: Payer: Self-pay | Admitting: Internal Medicine

## 2022-08-08 ENCOUNTER — Ambulatory Visit
Admission: RE | Admit: 2022-08-08 | Discharge: 2022-08-08 | Disposition: A | Discharge status: Home / Self Care | Payer: Medicare Other | Source: Ambulatory Visit | Attending: Internal Medicine | Admitting: Internal Medicine

## 2022-08-08 DIAGNOSIS — R1903 Right lower quadrant abdominal swelling, mass and lump: Secondary | ICD-10-CM | POA: Diagnosis present

## 2022-08-08 MED ORDER — IOHEXOL 300 MG/ML  SOLN: 100.0000 mL | Freq: Once | INTRAMUSCULAR | Status: DC | PRN

## 2022-08-08 MED ORDER — IOHEXOL 350 MG/ML SOLN
100.0000 mL | Freq: Once | INTRAVENOUS | Status: AC | PRN
  Administered 2022-08-08: 100 mL via INTRAVENOUS

## 2022-08-09 ENCOUNTER — Encounter: Payer: Medicare Other | Admitting: Physician Assistant

## 2022-08-09 DIAGNOSIS — E11622 Type 2 diabetes mellitus with other skin ulcer: Secondary | ICD-10-CM | POA: Diagnosis not present

## 2022-08-09 NOTE — Progress Notes (Signed)
AVANNA, RUESCH ([name]) [id number] Page 1 of 8 Visit Report for 08/09/2022 Chief Complaint Document Details Patient Name: Date of Service: [name], [name] 08/09/2022 12:00 PM Medical Record Number: [name] Patient Account Number: [id number] Date of Birth/Sex: Treating RN: 1940/11/01 (82 y.o. [name] Primary Care Provider: Tracie Harrier Other Clinician: Referring Provider: Treating Provider/Extender: Solon Palm Weeks [name]: 4 Information Obtained from: Patient Chief Complaint Left LE Ulcers Electronic Signature(s) Signed: 08/09/2022 12:18:53 PM By: Worthy Keeler PA-C Entered By: Worthy Keeler on 08/09/2022 12:18:53 -------------------------------------------------------------------------------- Debridement Details Patient Name: Date of Service: [facility] [name], Wanda UDREY [name]. 08/09/2022 12:00 PM Medical Record Number: [name] Patient Account Number: [id number] Date of Birth/Sex: Treating RN: April 14, 1941 (82 y.o. [name] Primary Care Provider: Tracie Harrier Other Clinician: Referring Provider: Treating Provider/Extender: Solon Palm Weeks [name]: 4 Debridement Performed for Assessment: Wound #2 Left,Lateral Lower Leg Performed By: Physician Tommie Sams., PA-C Debridement Type: Debridement Severity of Tissue Pre Debridement: Fat layer exposed Level of Consciousness (Pre-procedure): Awake and Alert Pre-procedure Verification/Time Out No Taken: Start Time: 12:23 T Area Debrided (L [name] W): otal 1.3 (cm) [name] 0.5 (cm) = 0.65 (cm) Tissue and other material debrided: Viable, Non-Viable, Subcutaneous Level: Skin/Subcutaneous Tissue Debridement Description: Excisional Instrument: Curette Bleeding: Minimum Hemostasis Achieved: Pressure Response to Treatment: Procedure was tolerated well Level of Consciousness (Post- Awake and Alert procedure): Yi, [name] ([name])  [id number] Page 2 of 8 Post Debridement Measurements of Total Wound Length: (cm) 1.3 Width: (cm) 0.5 Depth: (cm) 0.3 Volume: (cm) 0.153 Character of Wound/Ulcer Post Debridement: Stable Severity of Tissue Post Debridement: Fat layer exposed Post Procedure Diagnosis Same as Pre-procedure Electronic Signature(s) Signed: 08/09/2022 2:35:56 PM By: Worthy Keeler PA-C Signed: 08/12/2022 11:05:12 AM By: Rosalio Loud MSN RN CNS WTA Entered By: Rosalio Loud on 08/09/2022 12:24:42 -------------------------------------------------------------------------------- HPI Details Patient Name: Date of Service: [name], Wanda UDREY [name]. 08/09/2022 12:00 PM Medical Record Number: [name] Patient Account Number: [id number] Date of Birth/Sex: Treating RN: 03/09/1941 (82 y.o. [name] Primary Care Provider: Tracie Harrier Other Clinician: Referring Provider: Treating Provider/Extender: Solon Palm Weeks [name]: 4 History of Present Illness HPI Description: 07-12-2022 upon evaluation today patient presents for initial inspection concerning issues she has been having with wounds on her left lower extremity. She thinks she may have hit the lateral portion of her leg on the dishwasher door but the other she thinks may have been Wanda tape injury. It is more of Wanda blister type situation. With that being said she tells me that she is having some discomfort although is not terrible at this point. Fortunately I do not see any signs of infection locally or systemically at this time which is great news. No fevers, chills, nausea, vomiting, or diarrhea. Patient does have Wanda history of diabetes mellitus type 2, lymphedema for which she is not really able to wear compression socks that she tells me she cannot get them on. She also has hypertension, congestive heart failure, and chronic kidney disease stage III. 07-26-2022 upon evaluation today patient appears to be doing  well currently [name]. The compression wrapping does seem to be helping I am good I perform some debridement [name] but the other wound is actually looking quite well. I think we are headed [name] the right direction. Of note she did put her compression wrap down she was stating that this was getting  too tight. 08-02-2022 upon evaluation today patient appears to be doing well currently [name]. She is actually showing signs of improvement [name] regard to both and I am extremely pleased with where we stand today. I do not see any evidence of active infection locally nor systemically which is great news. No fevers, chills, nausea, vomiting, or diarrhea. 08-09-2022 upon evaluation today patient appears to be doing well currently [name]. She has been tolerating the dressing changes without complication and overall 1 wound is healed the other is doing much better. Fortunately I do not see any signs of active infection locally or systemically which is great news. Electronic Signature(s) Signed: 08/09/2022 1:16:52 PM By: Worthy Keeler PA-C Entered By: Worthy Keeler on 08/09/2022 13:16:52 [place], [name] ([id number]) [id number] Page 3 of 8 -------------------------------------------------------------------------------- Physical Exam Details Patient Name: Date of Service: [place], [name] 08/09/2022 12:00 PM Medical Record Number: [id number] Patient Account Number: [id number] Date of Birth/Sex: Treating RN: [birth year] (82 y.o. [name] Primary Care Provider: Tracie Harrier Other Clinician: Referring Provider: Treating Provider/Extender: Solon Palm Weeks [name]: 4 Constitutional Well-nourished and well-hydrated [name] no acute distress. Respiratory normal breathing without difficulty. Psychiatric this patient is able to make decisions and demonstrates good insight into  disease process. Alert and Oriented [name] 3. pleasant and cooperative. Notes Upon inspection patient's wound bed actually showed signs of good granulation epithelization at this point. Fortunately I do not see any evidence again of worsening overall which I think is great news and [name] general I think we are headed [name] the appropriate direction. Electronic Signature(s) Signed: 08/09/2022 1:21:49 PM By: Worthy Keeler PA-C Previous Signature: 08/09/2022 1:19:43 PM Version By: Worthy Keeler PA-C Entered By: Worthy Keeler on 08/09/2022 13:21:49 -------------------------------------------------------------------------------- Physician Orders Details Patient Name: Date of Service: [name], Wanda UDREY [name]. 08/09/2022 12:00 PM Medical Record Number: [id number] Patient Account Number: [id number] Date of Birth/Sex: Treating RN: 10/04/1940 (82 y.o. [name] Primary Care Provider: Tracie Harrier Other Clinician: Referring Provider: Treating Provider/Extender: Solon Palm Weeks [name]: 4 Verbal / Phone Orders: No Diagnosis Coding ICD-10 Coding Code Description E11.622 Type 2 diabetes mellitus with other skin ulcer I89.0 Lymphedema, not elsewhere classified L97.822 Non-pressure chronic ulcer of other part of left lower leg with fat layer exposed I10 Essential (primary) hypertension I50.42 Chronic combined systolic (congestive) and diastolic (congestive) heart failure N18.30 Chronic kidney disease, stage 3 unspecified [name], [name] ([id number]) [id number] Page 4 of 8 Follow-up Appointments Return Appointment [name] 1 week. Bathing/ Shower/ Hygiene May shower with wound dressing protected with water repellent cover or cast protector. Anesthetic (Use 'Patient Medications' Section for Anesthetic Order Entry) Lidocaine applied to wound bed Edema Control - Lymphedema / Segmental Compressive Device / Other Unna Boot for Lymphedema Elevate, Exercise  Daily and Wanda void Standing for Long Periods of Time. Elevate legs to the level of the heart and pump ankles as often as possible Elevate leg(s) parallel to the floor when sitting. Wound Treatment Wound #2 - Lower Leg Wound Laterality: Left, Lateral Cleanser: Soap and Water 1 [name] Per Week/30 Days Discharge Instructions: Gently cleanse wound with antibacterial soap, rinse and pat dry prior to dressing wounds Prim Dressing: Hydrofera Blue Ready Transfer Foam, 2.5x2.5 ([name]/[name]) 1 [name] Per Week/30 Days ary Discharge Instructions: Apply Hydrofera Blue Ready to wound bed as directed Secondary Dressing: ABD Pad 5x9 ([name]/[name]) 1 [name] Per Week/30 Days Discharge Instructions: Cover with  ABD pad Secondary Dressing: Gauze 1 [name] Per Week/30 Days Discharge Instructions: As directed: dry, moistened with saline or moistened with Dakins Solution Compression Wrap: UNNA BOOT - Unna-Z Zinc and Calamine Boot, 4x10 ([name]/yd) 1 [name] Per Week/30 Days Discharge Instructions: Use to anchor wrap [name] place. Apply one strip around leg 3-finger widths below the knee to secure the wrap. Electronic Signature(s) Signed: 08/09/2022 2:35:56 PM By: Worthy Keeler PA-C Signed: 08/12/2022 11:05:12 AM By: Rosalio Loud MSN RN CNS WTA Entered By: Rosalio Loud on 08/09/2022 12:25:49 -------------------------------------------------------------------------------- Problem List Details Patient Name: Date of Service: [name], Wanda UDREY [name]. 08/09/2022 12:00 PM Medical Record Number: [name] Patient Account Number: [id number] Date of Birth/Sex: Treating RN: 04-Dec-1940 (82 y.o. 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/06/2023 No Yes [name], [name] ([name]) [id number] Page 5 of 8 I10 Essential (primary) hypertension 07/12/2022 No Yes I50.42 Chronic combined systolic (congestive) and diastolic (congestive) heart failure 07/12/2022 No Yes N18.30 Chronic kidney disease, stage 3 unspecified 07/12/2022 No Yes Inactive Problems Resolved Problems Electronic Signature(s) Signed: 08/09/2022 12:18:48 PM By: Worthy Keeler PA-C Entered By: Worthy Keeler on 08/09/2022 12:18:48 -------------------------------------------------------------------------------- Progress Note Details Patient Name: Date of Service: [name], Wanda UDREY [name]. 08/09/2022 12:00 PM Medical Record Number: [name] Patient Account Number: [id number] Date of Birth/Sex: Treating RN: June 18, 1941 (82 y.o. [name] Primary Care Provider: Tracie Harrier Other Clinician: Referring Provider: Treating Provider/Extender: Solon Palm Weeks [name]: 4 Subjective Chief Complaint Information obtained from Patient Left LE Ulcers History of Present Illness (HPI) 07-12-2022 upon evaluation today patient presents for initial inspection concerning issues she has been having with wounds on her left lower extremity. She thinks she may have hit the lateral portion of her leg on the dishwasher door but the other she thinks may have been Wanda tape injury. It is more of Wanda blister type situation. With that being said she tells me that she is having some discomfort although is not terrible at this point. Fortunately I do not see any signs of infection locally or systemically at this time which is great news. No fevers, chills, nausea, vomiting, or diarrhea. Patient does have Wanda history of diabetes mellitus type 2, lymphedema for which she is not really able to wear compression socks that she tells me she cannot get them on.  She also has hypertension, congestive heart failure, and chronic kidney disease stage III. 07-26-2022 upon evaluation today patient appears to be doing well currently [name]. The compression wrapping does seem to be helping I am good I perform some debridement [name] but the other wound is actually looking quite well. I think we are headed [name] the right direction. Of note she did put her compression wrap down she was stating that this was getting too tight. 08-02-2022 upon evaluation today patient appears to be doing well currently [name]. She is actually showing signs of improvement [name] regard to both and I am extremely pleased with where we stand today. I do not see any evidence of active infection locally nor systemically which is great  news. No fevers, chills, nausea, vomiting, or diarrhea. 08-09-2022 upon evaluation today patient appears to be doing well currently [name]. She has been tolerating the dressing changes without complication and overall 1 wound is healed the other is doing much better. Fortunately I do not see any signs of active infection locally or systemically which is great news. [name], [name] ([id number]) [id number] Page 6 of 8 Objective Constitutional Well-nourished and well-hydrated [name] no acute distress. Vitals Time Taken: 12:02 PM, Height: 69 [name], Weight: 250 lbs, BMI: 36.9, Temperature: 98.1 F, Pulse: 52 bpm, Respiratory Rate: 16 breaths/min, Blood Pressure: 156/80 mmHg. Respiratory normal breathing without difficulty. Psychiatric this patient is able to make decisions and demonstrates good insight into disease process. Alert and Oriented [name] 3. pleasant and cooperative. General Notes: Upon inspection patient's wound bed actually showed signs of good granulation epithelization at this point. Fortunately I do not see any evidence again of worsening overall which I think is  great news and [name] general I think we are headed [name] the appropriate direction. Integumentary (Hair, Skin) Wound #1 status is Healed - Epithelialized. Original cause of wound was Gradually Appeared. The date acquired was: 05/01/2022. The wound has been [name] 4 weeks. The wound is located on the Left,Anterior Lower Leg. The wound measures 0cm length [name] 0cm width [name] 0cm depth; 0cm^2 area and 0cm^3 volume. There is Wanda medium amount of serosanguineous drainage noted. There is no granulation within the wound bed. There is Wanda large (67-100%) amount of necrotic tissue within the wound bed including Eschar. Wound #2 status is Open. Original cause of wound was Gradually Appeared. The date acquired was: 05/01/2022. The wound has been [name] 4 weeks. The wound is located on the Left,Lateral Lower Leg. The wound measures 1.3cm length [name] 0.5cm width [name] 0.2cm depth; 0.511cm^2 area and 0.102cm^3 volume. There is Wanda medium amount of serosanguineous drainage noted. There is no granulation within the wound bed. There is Wanda large (67-100%) amount of necrotic tissue within the wound bed including Adherent Slough. Assessment Active Problems ICD-10 Type 2 diabetes mellitus with other skin ulcer Lymphedema, not elsewhere classified Non-pressure chronic ulcer of other part of left lower leg with fat layer exposed Essential (primary) hypertension Chronic combined systolic (congestive) and diastolic (congestive) heart failure Chronic kidney disease, stage 3 unspecified Procedures Wound #2 Pre-procedure diagnosis of Wound #2 is Wanda Diabetic Wound/Ulcer of the Lower Extremity located on the Left,Lateral Lower Leg .Severity of Tissue Pre Debridement is: Fat layer exposed. There was Wanda Excisional Skin/Subcutaneous Tissue Debridement with Wanda total area of 0.65 sq cm performed by Tommie Sams., PA-C. With the following instrument(s): Curette to remove Viable and Non-Viable tissue/material. Material removed includes  Subcutaneous Tissue. No specimens were taken.Wanda Minimum amount of bleeding was controlled with Pressure. The procedure was tolerated well. Post Debridement Measurements: 1.3cm length [name] 0.5cm width [name] 0.3cm depth; 0.153cm^3 volume. Character of Wound/Ulcer Post Debridement is stable. Severity of Tissue Post Debridement is: Fat layer exposed. Post procedure Diagnosis Wound #2: Same as Pre-Procedure Pre-procedure diagnosis of Wound #2 is Wanda Diabetic Wound/Ulcer of the Lower Extremity located on the Left,Lateral Lower Leg . There was Wanda Haematologist Compression Therapy Procedure by Rosalio Loud, RN. Post procedure Diagnosis Wound #2: Same as Pre-Procedure Plan Follow-up Appointments: Return Appointment [name] 1 week. Bathing/ Shower/ Hygiene: May shower with wound dressing protected with water repellent cover or cast protector. Anesthetic (Use 'Patient Medications' Section for Anesthetic Order Entry): Lidocaine applied to wound  bed Edema Control - Lymphedema / Segmental Compressive Device / Other: Unna Boot for Lymphedema Elevate, Exercise Daily and Avoid Standing for Long Periods of Time. Elevate legs to the level of the heart and pump ankles as often as possible Elevate leg(s) parallel to the floor when sitting. [name], KUNKLER ([name]) [id number] Page 7 of 8 WOUND #2: - Lower Leg Wound Laterality: Left, Lateral Cleanser: Soap and Water 1 [name] Per Week/30 Days Discharge Instructions: Gently cleanse wound with antibacterial soap, rinse and pat dry prior to dressing wounds Prim Dressing: Hydrofera Blue Ready Transfer Foam, 2.5x2.5 ([name]/[name]) 1 [name] Per Week/30 Days ary Discharge Instructions: Apply Hydrofera Blue Ready to wound bed as directed Secondary Dressing: ABD Pad 5x9 ([name]/[name]) 1 [name] Per Week/30 Days Discharge Instructions: Cover with ABD pad Secondary Dressing: Gauze 1 [name] Per Week/30 Days Discharge Instructions: As directed: dry, moistened with saline or moistened with  Dakins Solution Com pression Wrap: UNNA BOOT - Unna-Z Zinc and Calamine Boot, 4x10 ([name]/yd) 1 [name] Per Week/30 Days Discharge Instructions: Use to anchor wrap [name] place. Apply one strip around leg 3-finger widths below the knee to secure the wrap. 1. I am good recommend that we have the patient continue to monitor for any signs of infection or worsening. Based on what I am seeing I do believe that we are headed [name] the right direction which is great news and [name] general I am extremely pleased with where we are today. This includes the use of the Hydrofera Blue dressing followed by an ABD pad and were doing the [employer] boot wrap which seems to be doing decently well. 2. We can recommend the patient should continue to elevate her legs much as possible obviously the goal is to try to keep things moving [name] the right direction here. 3. I am also going to suggest the patient should continue to monitor for any signs of infection or worsening. Obviously if anything changes she knows contact the office and let me know. We will see patient back for reevaluation [name] 1 week here [name] the clinic. If anything worsens or changes patient will contact our office for additional recommendations. Electronic Signature(s) Signed: 08/09/2022 1:22:27 PM By: Worthy Keeler PA-C Entered By: Worthy Keeler on 08/09/2022 13:22:27 -------------------------------------------------------------------------------- SuperBill Details Patient Name: Date of Service: [name], [name] 08/09/2022 Medical Record Number: [name] Patient Account Number: [id number] Date of Birth/Sex: Treating RN: Jul 08, 1940 (82 y.o. [name] Primary Care Provider: Tracie Harrier Other Clinician: Referring Provider: Treating Provider/Extender: Solon Palm Weeks [name]: 4 Diagnosis Coding ICD-10 Codes Code Description E11.622 Type 2 diabetes mellitus with other skin ulcer I89.0 Lymphedema, not elsewhere classified L97.822  Non-pressure chronic ulcer of other part of left lower leg with fat layer exposed I10 Essential (primary) hypertension I50.42 Chronic combined systolic (congestive) and diastolic (congestive) heart failure N18.30 Chronic kidney disease, stage 3 unspecified Facility Procedures : CPT4 Code: [id number] Description: [id number] - DEB SUBQ TISSUE 20 SQ CM/< ICD-10 Diagnosis Description L97.822 Non-pressure chronic ulcer of other part of left lower leg with fat layer expos Modifier: ed Quantity: 1 Physician Procedures : CPT4 Code Description Modifier [id number] - WC PHYS SUBQ TISS 20 SQ CM ICD-10 Diagnosis Description ROLLA, HARDIMON [name] ([name]) [id number] [name] [id number] Non-pressure chronic ulcer of other part of left lower leg with fat  layer exposed Quantity: 1 age 89 of 7 Electronic Signature(s) Signed: 08/09/2022 1:22:42 PM By: Worthy Keeler PA-C Entered By: Worthy Keeler on 08/09/2022 13:22:42

## 2022-08-12 ENCOUNTER — Other Ambulatory Visit: Payer: Self-pay | Admitting: Internal Medicine

## 2022-08-12 DIAGNOSIS — Z1231 Encounter for screening mammogram for malignant neoplasm of breast: Secondary | ICD-10-CM

## 2022-08-12 NOTE — Progress Notes (Signed)
Wanda, [name] ([id number]) [phone].pdf Page 1 of 10 Visit Report for 08/09/2022 Arrival Information Details Patient Name: Date of Service: [name], [name] 08/09/2022 12:00 PM Medical Record Number: [id number] Patient Account Number: [id number] Date of Birth/Sex: Treating RN: [birth year] (82 y.o. [name] Primary Care [name]: [name] Other Clinician: Referring [name]: Treating [name]: [name] Weeks in Treatment: 4 Visit Information History Since Last Visit Added or deleted any medications: No Patient Arrived: [name] Any new allergies or adverse reactions: No Arrival Time: 12:01 Had Wanda fall or experienced change in No Accompanied By: self activities of daily living that may affect Transfer Assistance: None risk of falls: Patient Identification Verified: Yes Hospitalized since last visit: No Secondary Verification Process Completed: Yes Has Dressing in Place as Prescribed: Yes Patient Requires Transmission-Based Precautions: No Has Compression in Place as Prescribed: Yes Patient Has Alerts: Yes Pain Present Now: No Patient Alerts: 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 08/14/21 R 1.04 TBI .96 08/14/21 Electronic Signature(s) Signed: 08/12/2022 11:05:12 AM By: Wanda Loud MSN RN CNS WTA Entered By: [name] on 08/09/2022 12:01:59 -------------------------------------------------------------------------------- Clinic Level of Care Assessment Details Patient Name: Date of Service: [name], [name] 08/09/2022 12:00 PM Medical Record Number: [id number] Patient Account Number: [id number] Date of Birth/Sex: Treating RN: 01/03/1941 (81 y.o. [name] Primary Care [name]: [name] Other Clinician: Referring [name]: Treating [name]: [name] Weeks in Treatment: 4 Clinic Level of Care Assessment Items TOOL 1 Quantity Score []$  - 0 Use when EandM and Procedure is performed on  INITIAL visit ASSESSMENTS - Nursing Assessment / Reassessment []$  - 0 General Physical Exam (combine w/ comprehensive assessment (listed just below) when performed on new pt. evals) []$  - 0 Comprehensive Assessment (HX, ROS, Risk Assessments, Wounds Hx, etc.) ASSESSMENTS - Wound and Skin Assessment / Reassessment []$  - 0 Dermatologic / Skin Assessment (not related to wound area) [name], [name] ([id number]) [id number] Page 2 of 10 ASSESSMENTS - Ostomy and/or Continence Assessment and Care []$  - 0 Incontinence Assessment and Management []$  - 0 Ostomy Care Assessment and Management (repouching, etc.) PROCESS - Coordination of Care []$  - 0 Simple Patient / Family Education for ongoing care []$  - 0 Complex (extensive) Patient / Family Education for ongoing care []$  - 0 Staff obtains Programmer, systems, Records, T Results / Process Orders est []$  - 0 Staff telephones HHA, Nursing Homes / Clarify orders / etc []$  - 0 Routine Transfer to another Facility (non-emergent condition) []$  - 0 Routine Hospital Admission (non-emergent condition) []$  - 0 New Admissions / Biomedical engineer / Ordering NPWT Apligraf, etc. , []$  - 0 Emergency Hospital Admission (emergent condition) PROCESS - Special Needs []$  - 0 Pediatric / Minor Patient Management []$  - 0 Isolation Patient Management []$  - 0 Hearing / Language / Visual special needs []$  - 0 Assessment of Community assistance (transportation, D/C planning, etc.) []$  - 0 Additional assistance / Altered mentation []$  - 0 Support Surface(s) Assessment (bed, cushion, seat, etc.) INTERVENTIONS - Miscellaneous []$  - 0 External ear exam []$  - 0 Patient Transfer (multiple staff / Civil Service fast streamer / Similar devices) []$  - 0 Simple Staple / Suture removal (25 or less) []$  - 0 Complex Staple / Suture removal (26 or more) []$  - 0 Hypo/Hyperglycemic Management (do not check if billed separately) []$  - 0 Ankle / Brachial Index (ABI) - do not check  if billed separately Has the patient been seen at the hospital within the last three years: Yes Total Score:  0 Level Of Care: ____ Electronic Signature(s) Signed: 08/12/2022 11:05:12 AM By: Wanda Loud MSN RN CNS WTA Entered By: [name] on 08/09/2022 12:25:55 -------------------------------------------------------------------------------- Compression Therapy Details Patient Name: Date of Service: [name] 08/09/2022 12:00 PM Medical Record Number: [id number] Patient Account Number: [id number] Date of Birth/Sex: Treating RN: [birth year] (82 y.o. [name] Primary Care Donja Tipping: [name] Other Clinician: Referring [name]: Treating [name]: [name] Weeks in Treatment: 4 Compression Therapy Performed for Wound Assessment: Wound #2 Left,Lateral Lower Leg Performed By: Clinician Wanda Loud, RN Compression Type: Rolena Infante [place], [name] ([id number]) [phone].pdf Page 3 of 10 Post Procedure Diagnosis Same as Pre-procedure Electronic Signature(s) Signed: 08/12/2022 11:05:12 AM By: Wanda Loud MSN RN CNS WTA Entered By: [name] on 08/09/2022 12:25:29 -------------------------------------------------------------------------------- Encounter Discharge Information Details Patient Name: Date of Service: [name] 08/09/2022 12:00 PM Medical Record Number: [id number] Patient Account Number: [id number] Date of Birth/Sex: Treating RN: 10/26/1940 (82 y.o. [name] Primary Care Tabor Bartram: [name] Other Clinician: Referring Damyiah Moxley: Treating [name]: [name] Weeks in Treatment: 4 Encounter Discharge Information Items Post Procedure Vitals Discharge Condition: Stable Temperature (F): 98.1 Ambulatory Status: Ambulatory Pulse (bpm): 52 Discharge Destination: Home Respiratory Rate (breaths/min): 16 Transportation: Private Auto Blood Pressure  (mmHg): 156/80 Accompanied By: self Schedule Follow-up Appointment: Yes Clinical Summary of Care: Electronic Signature(s) Signed: 08/12/2022 11:05:12 AM By: Wanda Loud MSN RN CNS WTA Entered By: [name] on 08/09/2022 12:27:34 -------------------------------------------------------------------------------- Lower Extremity Assessment Details Patient Name: Date of Service: [name] 08/09/2022 12:00 PM Medical Record Number: [id number] Patient Account Number: [id number] Date of Birth/Sex: Treating RN: Feb 01, 1941 (82 y.o. [name] Primary Care Navy Rothschild: [name] Other Clinician: Referring Emet Rafanan: Treating Koltyn Kelsay/Extender: Nadara Mustard, Vishwanath Weeks in Treatment: 4 Edema Assessment Assessed: [Left: No] [Right: No] [Left: Edema] [Right: :] Calf Buysse, Everlie [name] ([id number]) [id number] Page 4 of 10 Left: Right: Point of Measurement: 34 cm From Medial Instep 39.5 cm Ankle Left: Right: Point of Measurement: 12 cm From Medial Instep 25.3 cm Vascular Assessment Pulses: Dorsalis Pedis Palpable: [Left:Yes] Electronic Signature(s) Signed: 08/12/2022 11:05:12 AM By: Wanda Loud MSN RN CNS WTA Entered By: [name] on 08/09/2022 12:15:53 -------------------------------------------------------------------------------- Multi Wound Chart Details Patient Name: Date of Service: [name] 08/09/2022 12:00 PM Medical Record Number: [id number] Patient Account Number: [id number] Date of Birth/Sex: Treating RN: [birth year] (82 y.o. [name] Primary Care Mercedies Ganesh: [name] Other Clinician: Referring Lyden Redner: Treating Deunte Bledsoe/Extender: Nadara Mustard, Vishwanath Weeks in Treatment: 4 Vital Signs Height(in): 69 Pulse(bpm): 52 Weight(lbs): 250 Blood Pressure(mmHg): 156/80 Body Mass Index(BMI): 36.9 Temperature(F): 98.1 Respiratory Rate(breaths/min): 16 [1:Photos:] [N/Wanda:N/Wanda] Left, Anterior  Lower Leg Left, Lateral Lower Leg N/Wanda Wound Location: Gradually Appeared Gradually Appeared N/Wanda Wounding Event: Diabetic Wound/Ulcer of the Lower Diabetic Wound/Ulcer of the Lower N/Wanda Primary Etiology: Extremity Extremity Lymphedema, Congestive Heart Lymphedema, Congestive Heart N/Wanda Comorbid History: Failure, Hypertension, Type II Failure, Hypertension, Type II Diabetes Diabetes 05/01/2022 05/01/2022 N/Wanda Date Acquired: 4 4 N/Wanda Weeks of Treatment: Open Open N/Wanda Wound Status: No No N/Wanda Wound Recurrence: 0.3x0.2x0.1 1.3x0.5x0.2 N/Wanda Measurements L x W x D (cm) 0.047 0.511 N/Wanda Wanda (cm) : rea 0.005 0.102 N/Wanda Volume (cm) : 99.40% 81.90% N/Wanda % Reduction in Area: 99.40% 81.90% N/Wanda % Reduction in Volume: Grade 2 Grade 2 N/Wanda Classification: [name], [name] ([id number]) [id number] Page 5 of 10 Medium Medium N/Wanda Exudate Wanda mount: Serosanguineous Serosanguineous N/Wanda Exudate  Type: red, brown red, brown N/Wanda Exudate Color: None Present (0%) None Present (0%) N/Wanda Granulation Amount: Large (67-100%) Large (67-100%) N/Wanda Necrotic Amount: Eschar, Adherent Slough Adherent Slough N/Wanda Necrotic Tissue: Fascia: No Fascia: No N/Wanda Exposed Structures: Fat Layer (Subcutaneous Tissue): No Fat Layer (Subcutaneous Tissue): No Tendon: No Tendon: No Muscle: No Muscle: No Joint: No Joint: No Bone: No Bone: No None None N/Wanda Epithelialization: Treatment Notes Electronic Signature(s) Signed: 08/12/2022 11:05:12 AM By: Wanda Loud MSN RN CNS WTA Entered By: [name] on 08/09/2022 12:16:02 -------------------------------------------------------------------------------- Multi-Disciplinary Care Plan Details Patient Name: Date of Service: [facility] [place], [name] 08/09/2022 12:00 PM Medical Record Number: [id number] Patient Account Number: [id number] Date of Birth/Sex: Treating RN: [birth year] (82 y.o. [name] Primary Care Vira Chaplin: [name] Other  Clinician: Referring Adina Puzzo: Treating Alyn Riedinger/Extender: [name] Weeks in Treatment: 4 Active Inactive Necrotic Tissue Nursing Diagnoses: Knowledge deficit related to management of necrotic/devitalized tissue Goals: Patient/caregiver will verbalize understanding of reason and process for debridement of necrotic tissue Date Initiated: 07/12/2022 Target Resolution Date: 08/12/2022 Goal Status: Active Interventions: Assess patient pain level pre-, during and post procedure and prior to discharge Provide education on necrotic tissue and debridement process Notes: Wound/Skin Impairment Nursing Diagnoses: Knowledge deficit related to ulceration/compromised skin integrity Goals: Patient/caregiver will verbalize understanding of skin care regimen Date Initiated: 07/12/2022 Target Resolution Date: 08/12/2022 Goal Status: Active Ulcer/skin breakdown will have Wanda volume reduction of 30% by week 4 Date Initiated: 07/12/2022 Target Resolution Date: 08/12/2022 Goal Status: Active Ulcer/skin breakdown will have Wanda volume reduction of 50% by week 8 Date Initiated: 07/12/2022 Target Resolution Date: 09/10/2022 [name], [name] ([id number]) [id number] Page 6 of 10 Goal Status: Active Ulcer/skin breakdown will have Wanda volume reduction of 80% by week 12 Date Initiated: 07/12/2022 Target Resolution Date: 10/11/2022 Goal Status: Active Ulcer/skin breakdown will heal within 14 weeks Date Initiated: 07/12/2022 Target Resolution Date: 11/10/2022 Goal Status: Active Interventions: Assess patient/caregiver ability to obtain necessary supplies Assess patient/caregiver ability to perform ulcer/skin care regimen upon admission and as needed Assess ulceration(s) every visit Notes: Electronic Signature(s) Signed: 08/12/2022 11:05:12 AM By: Wanda Loud MSN RN CNS WTA Entered By: [name] on 08/09/2022  12:26:37 -------------------------------------------------------------------------------- Pain Assessment Details Patient Name: Date of Service: [name] 08/09/2022 12:00 PM Medical Record Number: [id number] Patient Account Number: [id number] Date of Birth/Sex: Treating RN: [birth year] (82 y.o. [name] Primary Care Jane Birkel: [name] Other Clinician: Referring Marcella Dunnaway: Treating Tanicia Wolaver/Extender: [name] Weeks in Treatment: 4 Active Problems Location of Pain Severity and Description of Pain Patient Has Paino No Site Locations Pain Management and Medication Current Pain Management: Electronic Signature(s) Signed: 08/12/2022 11:05:12 AM By: Wanda Loud MSN RN CNS WTA Entered By: [name] on 08/09/2022 12:03:59 [name], [name] ([id number]) [id number] Page 7 of 10 -------------------------------------------------------------------------------- Patient/Caregiver Education Details Patient Name: Date of Service: [place] 2/9/2024andnbsp12:00 PM Medical Record Number: [id number] Patient Account Number: [id number] Date of Birth/Gender: Treating RN: [birth year] (82 y.o. [name] Primary Care Physician: [name] Other Clinician: Referring Physician: Treating Physician/Extender: [name] Weeks in Treatment: 4 Education Assessment Education Provided To: Patient Education Topics Provided Wound/Skin Impairment: Handouts: Caring for Your Ulcer Methods: Explain/Verbal Responses: State content correctly Electronic Signature(s) Signed: 08/12/2022 11:05:12 AM By: Wanda Loud MSN RN CNS WTA Entered By: [name] on 08/09/2022 12:26:32 -------------------------------------------------------------------------------- Wound Assessment Details Patient Name: Date of Service: [name] 08/09/2022 12:00 PM Medical Record  Number: [id number] Patient Account Number:  [id number] Date of Birth/Sex: Treating RN: 1941-05-25 (82 y.o. [name] Primary Care Aariana Shankland: [name] Other Clinician: Referring Amberlyn Martinezgarcia: Treating Seleen Walter/Extender: [name] Weeks in Treatment: 4 Wound Status Wound Number: 1 Primary Diabetic Wound/Ulcer of the Lower Extremity Etiology: Wound Location: Left, Anterior Lower Leg Wound Status: Healed - Epithelialized Wounding Event: Gradually Appeared Comorbid Lymphedema, Congestive Heart Failure, Hypertension, Type Date Acquired: 05/01/2022 History: II Diabetes Weeks Of Treatment: 4 Clustered Wound: No Photos [name], [name] ([id number]) [id number] Page 8 of 10 Wound Measurements Length: (cm) Width: (cm) Depth: (cm) Area: (cm) Volume: (cm) 0 % Reduction in Area: 100% 0 % Reduction in Volume: 100% 0 Epithelialization: None 0 0 Wound Description Classification: Grade 2 Exudate Amount: Medium Exudate Type: Serosanguineous Exudate Color: red, brown Foul Odor After Cleansing: No Slough/Fibrino Yes Wound Bed Granulation Amount: None Present (0%) Exposed Structure Necrotic Amount: Large (67-100%) Fascia Exposed: No Necrotic Quality: Eschar Fat Layer (Subcutaneous Tissue) Exposed: No Tendon Exposed: No Muscle Exposed: No Joint Exposed: No Bone Exposed: No Treatment Notes Wound #1 (Lower Leg) Wound Laterality: Left, Anterior Cleanser Peri-Wound Care Topical Primary Dressing Secondary Dressing Secured With Compression Wrap Compression Stockings Add-Ons Electronic Signature(s) Signed: 08/12/2022 11:05:12 AM By: Wanda Loud MSN RN CNS WTA Entered By: [name] on 08/09/2022 12:22:40 Wound Assessment Details -------------------------------------------------------------------------------- [name] ([id number]) [id number] Page 9 of 10 Patient Name: Date of Service: [place], [name] 08/09/2022 12:00 PM Medical  Record Number: [id number] Patient Account Number: [id number] Date of Birth/Sex: Treating RN: [birth year] (82 y.o. [name] Primary Care Naraya Stoneberg: [name] Other Clinician: Referring Kellon Chalk: Treating [name]: [name] Weeks in Treatment: 4 Wound Status Wound Number: 2 Primary Diabetic Wound/Ulcer of the Lower Extremity Etiology: Wound Location: Left, Lateral Lower Leg Wound Status: Open Wounding Event: Gradually Appeared Comorbid Lymphedema, Congestive Heart Failure, Hypertension, Type Date Acquired: 05/01/2022 History: II Diabetes Weeks Of Treatment: 4 Clustered Wound: No Photos Wound Measurements Length: (cm) 1.3 % Reduction in Area: 81.9% Width: (cm) 0.5 % Reduction in Volume: 81.9% Depth: (cm) 0.2 Epithelialization: None Area: (cm) 0.511 Volume: (cm) 0.102 Wound Description Classification: Grade 2 Foul Odor After Cleansing: No Exudate Amount: Medium Slough/Fibrino Yes Exudate Type: Serosanguineous Exudate Color: red, brown Wound Bed Granulation Amount: None Present (0%) Exposed Structure Necrotic Amount: Large (67-100%) Fascia Exposed: No Necrotic Quality: Adherent Slough Fat Layer (Subcutaneous Tissue) Exposed: No Tendon Exposed: No Muscle Exposed: No Joint Exposed: No Bone Exposed: No Treatment Notes Wound #2 (Lower Leg) Wound Laterality: Left, Lateral Cleanser Soap and Water Discharge Instruction: Gently cleanse wound with antibacterial soap, rinse and pat dry prior to dressing wounds Peri-Wound Care Topical Primary Dressing Hydrofera Blue Ready Transfer Foam, 2.5x2.5 (in/in) Discharge Instruction: Apply Hydrofera Blue Ready to wound bed as directed Secondary Dressing ABD Pad 5x9 (in/in) Discharge Instruction: Cover with ABD pad Gauze Discharge Instruction: As directed: dry, moistened with saline or moistened with Dakins Solution [name], [name] ([id number]) [id number] Page 10  of 10 Secured With Compression Wrap UNNA BOOT - Unna-Z Zinc and Calamine Boot, 4x10 (in/yd) Discharge Instruction: Use to anchor wrap in place. Apply one strip around leg 3-finger widths below the knee to secure the wrap. Compression Stockings Add-Ons Electronic Signature(s) Signed: 08/12/2022 11:05:12 AM By: Wanda Loud MSN RN CNS WTA Entered By: [name] on 08/09/2022 12:14:08 -------------------------------------------------------------------------------- Vitals Details Patient Name: Date of Service: [name] 08/09/2022 12:00 PM Medical Record Number: [id number] Patient Account Number:  [id number] Date of Birth/Sex: Treating RN: 1940/08/04 (82 y.o. [name] Primary Care Kaeden Mester: [name] Other Clinician: Referring Dylann Gallier: Treating Bryann Gentz/Extender: [name] Weeks in Treatment: 4 Vital Signs Time Taken: 12:02 Temperature (F): 98.1 Height (in): 69 Pulse (bpm): 52 Weight (lbs): 250 Respiratory Rate (breaths/min): 16 Body Mass Index (BMI): 36.9 Blood Pressure (mmHg): 156/80 Reference Range: 80 - 120 mg / dl Electronic Signature(s) Signed: 08/12/2022 11:05:12 AM By: Wanda Loud MSN RN CNS WTA Entered By: [name] on 08/09/2022 12:03:46

## 2022-08-16 ENCOUNTER — Encounter: Payer: Medicare Other | Admitting: Physician Assistant

## 2022-08-16 DIAGNOSIS — E11622 Type 2 diabetes mellitus with other skin ulcer: Secondary | ICD-10-CM | POA: Diagnosis not present

## 2022-08-17 NOTE — Progress Notes (Signed)
[name], [name] ([id number]) [phone].pdf Page 1 of 8 Visit Report for 08/16/2022 Arrival Information Details Patient Name: Date of Service: [name], [name] 08/16/2022 12:00 [name] Medical Record Number: [id number] Patient Account Number: [id number] Date of Birth/Sex: Treating RN: 19-Mar-1941 (82 y.o. [name] Primary Care [place] Basista: [name] Other Clinician: Referring [name]: Treating [name]: [name]: 5 Visit Information History Since Last Visit Added or deleted any medications: No Patient Arrived: Cane Any new allergies or adverse reactions: No Arrival Time: 12:03 Hospitalized since last visit: No Accompanied By: self Has Dressing [name] Place as Prescribed: Yes Transfer Assistance: None Has Compression [name] Place as Prescribed: Yes Patient Identification Verified: Yes Pain Present Now: No Secondary Verification Process Completed: Yes Patient Requires Transmission-Based Precautions: No Patient Has Alerts: Yes Patient Alerts: 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 08/14/21 R 1.04 TBI .96 08/14/21 Electronic Signature(s) Signed: 08/16/2022 1:32:16 [name] By: Wanda Loud MSN RN CNS WTA Entered By: [name] on 08/16/2022 12:07:37 -------------------------------------------------------------------------------- Clinic Level of Care Assessment Details Patient Name: Date of Service: [name] 08/16/2022 12:00 [name] Medical Record Number: [id number] Patient Account Number: [id number] Date of Birth/Sex: Treating RN: 20-[name] (82 y.o. [name] Primary Care [name]: [name] Other Clinician: Referring [name]: Treating [name]: [name]: 5 Clinic Level of Care Assessment Items TOOL 1 Quantity Score []$  - 0 Use when EandM and Procedure is performed on INITIAL visit ASSESSMENTS - Nursing Assessment / Reassessment []$  - 0 General Physical Exam  (combine w/ comprehensive assessment (listed just below) when performed on new pt. evals) []$  - 0 Comprehensive Assessment (HX, ROS, Risk Assessments, Wounds Hx, etc.) ASSESSMENTS - Wound and Skin Assessment / Reassessment []$  - 0 Dermatologic / Skin Assessment (not related to wound area) [name] ([id number]) [id number] Page 2 of 8 ASSESSMENTS - Ostomy and/or Continence Assessment and Care []$  - 0 Incontinence Assessment and Management []$  - 0 Ostomy Care Assessment and Management (repouching, etc.) PROCESS - Coordination of Care []$  - 0 Simple Patient / Family Education for ongoing care []$  - 0 Complex (extensive) Patient / Family Education for ongoing care []$  - 0 Staff obtains Programmer, systems, Records, T Results / Process Orders est []$  - 0 Staff telephones HHA, Nursing Homes / Clarify orders / etc []$  - 0 Routine Transfer to another Facility (non-emergent condition) []$  - 0 Routine Hospital Admission (non-emergent condition) []$  - 0 New Admissions / Biomedical engineer / Ordering NPWT Apligraf, etc. , []$  - 0 Emergency Hospital Admission (emergent condition) PROCESS - Special Needs []$  - 0 Pediatric / Minor Patient Management []$  - 0 Isolation Patient Management []$  - 0 Hearing / Language / Visual special needs []$  - 0 Assessment of Community assistance (transportation, D/C planning, etc.) []$  - 0 Additional assistance / Altered mentation []$  - 0 Support Surface(s) Assessment (bed, cushion, seat, etc.) INTERVENTIONS - Miscellaneous []$  - 0 External ear exam []$  - 0 Patient Transfer (multiple staff / Civil Service fast streamer / Similar devices) []$  - 0 Simple Staple / Suture removal (25 or less) []$  - 0 Complex Staple / Suture removal (26 or more) []$  - 0 Hypo/Hyperglycemic Management (do not check if billed separately) []$  - 0 Ankle / Brachial Index (ABI) - do not check if billed separately Has the patient been seen at the hospital within the last three years:  Yes Total Score: 0 Level Of Care: ____ Electronic Signature(s) Signed: 08/16/2022 1:32:16 [name] By: Wanda Loud MSN RN CNS WTA  Entered By: [name] on 08/16/2022 12:42:01 -------------------------------------------------------------------------------- Encounter Discharge Information Details Patient Name: Date of Service: [name], [name] Medical Record Number: [id number] Patient Account Number: [id number] Date of Birth/Sex: Treating RN: 20-Mar-1941 (82 y.o. [name] Primary Care [name]: [name] Other Clinician: Referring [name]: Treating [name]: [name]: 5 Encounter Discharge Information Items Post Procedure [name] ([id number]) [id number] Page 3 of 8 Discharge Condition: Stable Temperature (F): 97.8 Ambulatory Status: Cane Pulse (bpm): 62 Discharge Destination: Home Respiratory Rate (breaths/min): 16 Transportation: Private Auto Blood Pressure (mmHg): 141/61 Accompanied By: self Schedule Follow-up Appointment: Yes Clinical Summary of Care: Electronic Signature(s) Signed: 08/16/2022 1:32:16 [name] By: Wanda Loud MSN RN CNS WTA Entered By: [name] on 08/16/2022 12:43:47 -------------------------------------------------------------------------------- Lower Extremity Assessment Details Patient Name: Date of Service: [name] 08/16/2022 12:00 [name] Medical Record Number: [id number] Patient Account Number: [id number] Date of Birth/Sex: Treating RN: June 13, 1941 (82 y.o. [name] Primary Care [name]: [name] Other Clinician: Referring [name]: Treating [name]: [name]: 5 Edema Assessment Assessed: [Left: No] [Right: No] [Left: Edema] [Right: :] Calf Left: Right: [name] of Measurement: 34 cm From Medial Instep 39 cm Ankle Left: Right: [name] of Measurement: 12 cm  From Medial Instep 25 cm Vascular Assessment Pulses: Dorsalis Pedis Palpable: [Left:Yes] Electronic Signature(s) Signed: 08/16/2022 1:32:16 [name] By: Wanda Loud MSN RN CNS WTA Entered By: [name] on 08/16/2022 12:19:50 [place], [name] ([id number]) [id number] Page 4 of 8 -------------------------------------------------------------------------------- Multi Wound Chart Details Patient Name: Date of Service: [place], [name] 08/16/2022 12:00 [name] Medical Record Number: [id number] Patient Account Number: [id number] Date of Birth/Sex: Treating RN: 02-22-1941 (82 y.o. 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 Etiology: Extremity Lymphedema, Congestive Heart N/Wanda N/Wanda Comorbid History: Failure, Hypertension, Type II Diabetes 05/01/2022 N/Wanda N/Wanda Date Acquired: 5 N/Wanda N/Wanda Weeks of Treatment: Open N/Wanda N/Wanda Wound Status: No N/Wanda N/Wanda Wound Recurrence: 1x0.7x0.2 N/Wanda N/Wanda Measurements L x W x D (cm) 0.55 N/Wanda N/Wanda Wanda (cm) : rea 0.11 N/Wanda N/Wanda Volume (cm) : 80.50% N/Wanda N/Wanda % Reduction [name] Wanda rea: 80.50% N/Wanda N/Wanda % Reduction [name] Volume: Grade 2 N/Wanda N/Wanda Classification: Medium N/Wanda N/Wanda Exudate Wanda mount: Serosanguineous N/Wanda N/Wanda Exudate Type: red, brown N/Wanda N/Wanda Exudate Color: None Present (0%) N/Wanda N/Wanda Granulation Wanda mount: Large (67-100%) N/Wanda N/Wanda Necrotic Wanda mount: Fascia: No N/Wanda N/Wanda Exposed Structures: Fat Layer (Subcutaneous Tissue): No Tendon: No Muscle: No Joint: No Bone: No None N/Wanda  N/Wanda Epithelialization: Treatment Notes Electronic Signature(s) Signed: 08/16/2022 1:32:16 [name] By: Wanda Loud MSN RN CNS WTA Entered By: [name] on 08/16/2022 12:36:35 [place], [name] ([id number]) [id number] Page 5 of 8 -------------------------------------------------------------------------------- Multi-Disciplinary Care Plan Details Patient Name: Date of Service: [place], [name] 08/16/2022 12:00 [name] Medical Record Number: [id number] Patient Account Number: [id number] Date of Birth/Sex: Treating RN: Dec 07, 1940 (82 y.o. [name] Primary Care [place] C[place]: [name] Other Clinician: Referring Shaune Malacara: Treating Moani Weipert/Extender: [name]: 5 Active Inactive Necrotic Tissue Nursing Diagnoses: Knowledge deficit related to management of necrotic/devitalized  tissue Goals: Patient/caregiver will verbalize understanding of reason and process for debridement of necrotic tissue Date Initiated: 07/12/2022 Target Resolution Date: 08/12/2022 Goal Status: Active Interventions: Assess patient pain level pre-, during and post procedure and prior to discharge Provide education on necrotic tissue and debridement process Notes: Wound/Skin Impairment Nursing Diagnoses: Knowledge deficit related to ulceration/compromised skin integrity Goals: Patient/caregiver will verbalize understanding of skin care regimen Date Initiated: 07/12/2022 Target Resolution Date: 08/12/2022 Goal Status: Active Ulcer/skin breakdown will have Wanda volume reduction of 30% by week 4 Date Initiated: 07/12/2022 Target Resolution Date: 08/12/2022 Goal Status: Active Ulcer/skin breakdown will have Wanda volume reduction of 50% by week 8 Date Initiated: 07/12/2022 Target Resolution Date: 09/10/2022 Goal Status: Active Ulcer/skin breakdown will have Wanda volume reduction of 80% by week 12 Date Initiated: 07/12/2022 Target Resolution Date: 10/11/2022 Goal  Status: Active Ulcer/skin breakdown will heal within 14 weeks Date Initiated: 07/12/2022 Target Resolution Date: 11/10/2022 Goal Status: Active Interventions: Assess patient/caregiver ability to obtain necessary supplies Assess patient/caregiver ability to perform ulcer/skin care regimen upon admission and as needed Assess ulceration(s) every visit Notes: Electronic Signature(s) Signed: 08/16/2022 1:32:16 [name] By: Wanda Loud MSN RN CNS WTA Entered By: [name] on 08/16/2022 12:42:48 Tvedt, [name] ([id number]) [id number] Page 6 of 8 -------------------------------------------------------------------------------- Pain Assessment Details Patient Name: Date of Service: [place], [name] 08/16/2022 12:00 [name] Medical Record Number: [id number] Patient Account Number: [id number] Date of Birth/Sex: Treating RN: Jun 28, 1941 (82 y.o. [name] Primary Care [place] Wherry: [name] Other Clinician: Referring Illyanna Petillo: Treating Shalin Vonbargen/Extender: [name]: 5 Active Problems Location of Pain Severity and Description of Pain Patient Has Paino No Site Locations Pain Management and Medication Current Pain Management: Electronic Signature(s) Signed: 08/16/2022 1:32:16 [name] By: Wanda Loud MSN RN CNS WTA Entered By: [name] on 08/16/2022 12:10:23 -------------------------------------------------------------------------------- Patient/Caregiver Education Details Patient Name: Date of Service: [name], [name] 2/16/2024andnbsp12:00 [name] Medical Record Number: [id number] Patient Account Number: [id number] Date of Birth/Gender: Treating RN: [birth year] (82 y.o. [name] Primary Care Physician: [name] Other Clinician: Referring Physician: Treating Physician/Extender: [name]: 5 Education Assessment Education Provided To: Patient Education Topics  Provided Wound/Skin Impairment: Handouts: Caring for Your Ulcer Methods: Explain/Verbal Responses: State content correctly [name], [name] ([id number]) [id number] Page 7 of 8 Electronic Signature(s) Signed: 08/16/2022 1:32:16 [name] By: Wanda Loud MSN RN CNS WTA Entered By: [name] on 08/16/2022 12:42:43 -------------------------------------------------------------------------------- Wound Assessment Details Patient Name: Date of Service: [facility] [name], [name] 08/16/2022 12:00 [name] Medical Record Number: [id number] Patient Account Number: [id number] Date of Birth/Sex: Treating RN: 1941/03/31 (82 y.o. [name] Primary Care Kahlan Engebretson: [name] Other Clinician: Referring Keene Gilkey: Treating Miya Luviano/Extender: [name]: 5 Wound Status Wound Number: 2 Primary Diabetic Wound/Ulcer of the Lower Extremity Etiology: Wound Location: Left, Lateral Lower Leg Wound Status: Open Wounding Event: Gradually Appeared Comorbid Lymphedema, Congestive Heart Failure, Hypertension, Type Date Acquired: 05/01/2022 History: II Diabetes Weeks Of Treatment: 5 Clustered Wound: No Photos Wound Measurements Length: (cm) 1 Width: (cm) 0.7 Depth: (cm) 0.2 Area: (cm) 0.55 Volume: (cm) 0.11 % Reduction [name] Area: 80.5% % Reduction [name] Volume: 80.5% Epithelialization: None Wound Description Classification: Grade 2 Exudate Amount: Medium Exudate Type: Serosanguineous Exudate Color: red, brown Foul Odor After Cleansing: No Slough/Fibrino Yes Wound Bed Granulation Amount: None Present (0%) Exposed Structure Necrotic Amount: Large (67-100%) Fascia Exposed: No Necrotic Quality: Adherent Slough Fat Layer (Subcutaneous Tissue) Exposed: No  Tendon Exposed: No Muscle Exposed: No Joint Exposed: No Bone Exposed: No [name], [name] ([id number]) [id number] Page 8 of 8 Treatment Notes Wound #2 (Lower Leg)  Wound Laterality: Left, Lateral Cleanser Soap and Water Discharge Instruction: Gently cleanse wound with antibacterial soap, rinse and pat dry prior to dressing wounds Peri-Wound Care Topical Primary Dressing Hydrofera Blue Ready Transfer Foam, 2.5x2.5 ([name]/[name]) Discharge Instruction: Apply Hydrofera Blue Ready to wound bed as directed Secondary Dressing Secured With Tegaderm Film 4x4 ([name]/[name]) Discharge Instruction: Apply to wound bed Tubigrip Size D, 3x10 ([name]/yd) Compression Wrap Compression Stockings Add-Ons Electronic Signature(s) Signed: 08/16/2022 1:32:16 [name] By: Wanda Loud MSN RN CNS WTA Entered By: [name] on 08/16/2022 12:18:35 -------------------------------------------------------------------------------- Vitals Details Patient Name: Date of Service: [name], [name] 08/16/2022 12:00 [name] Medical Record Number: [id number] Patient Account Number: [id number] Date of Birth/Sex: Treating RN: December 09, 1940 (82 y.o. [name] Primary Care Eriko Economos: [name] Other Clinician: Referring Tammi Boulier: Treating Bravlio Luca/Extender: [name]: 5 Vital Signs Time Taken: 12:07 Temperature (F): 97.8 Height ([name]): 69 Pulse (bpm): 62 Weight (lbs): 250 Respiratory Rate (breaths/min): 16 Body Mass Index (BMI): 36.9 Blood Pressure (mmHg): 141/61 Reference Range: 80 - 120 mg / dl Electronic Signature(s) Signed: 08/16/2022 1:32:16 [name] By: Wanda Loud MSN RN CNS WTA Entered By: [name] on 08/16/2022 12:10:18

## 2022-08-17 NOTE — Progress Notes (Addendum)
[name], [name] ([id number]) [id number] Page 1 of 8 Visit Report for 08/16/2022 Chief Complaint Document Details Patient Name: Date of Service: [place], [name]. 08/16/2022 12:00 PM Medical Record Number: [id number] Patient Account Number: [id number] Date of Birth/Sex: Treating RN: [birth year] (82 y.o. [name] Primary Care Provider: Tracie Harrier Other Clinician: Referring Provider: Treating Provider/Extender: Solon Palm Weeks [name]: 5 Information Obtained from: Patient Chief Complaint Left LE Ulcers Electronic Signature(s) Signed: 08/16/2022 12:33:00 PM By: Worthy Keeler PA-C Entered By: Worthy Keeler on 08/16/2022 12:33:00 -------------------------------------------------------------------------------- Debridement Details Patient Name: Date of Service: [facility] [name], Wanda UDREY [name]. 08/16/2022 12:00 PM Medical Record Number: [id number] Patient Account Number: [id number] Date of Birth/Sex: Treating RN: [birth year] (82 y.o. [name] Primary Care Provider: Tracie Harrier Other Clinician: Referring Provider: Treating Provider/Extender: Solon Palm Weeks [name]: 5 Debridement Performed for Assessment: Wound #2 Left,Lateral Lower Leg Performed By: Physician Tommie Sams., PA-C Debridement Type: Debridement Severity of Tissue Pre Debridement: Fat layer exposed Level of Consciousness (Pre-procedure): Awake and Alert Pre-procedure Verification/Time Out Yes - 12:37 Taken: Start Time: 12:37 T Area Debrided (L x W): otal 1 (cm) x 0.7 (cm) = 0.7 (cm) Tissue and other material debrided: Viable, Non-Viable, Slough, Subcutaneous, Slough Level: Skin/Subcutaneous Tissue Debridement Description: Excisional Instrument: Curette Bleeding: Minimum Hemostasis Achieved: Pressure Response to Treatment: Procedure was tolerated well Level of Consciousness (Post- Awake and Alert procedure): [name], [name]  ([id number]) [id number] Page 2 of 8 Post Debridement Measurements of Total Wound Length: (cm) 1 Width: (cm) 0.7 Depth: (cm) 0.3 Volume: (cm) 0.165 Character of Wound/Ulcer Post Debridement: Stable Severity of Tissue Post Debridement: Fat layer exposed Post Procedure Diagnosis Same as Pre-procedure Electronic Signature(s) Signed: 08/16/2022 1:17:52 PM By: Worthy Keeler PA-C Signed: 08/16/2022 1:32:16 PM By: Rosalio Loud MSN RN CNS WTA Entered By: Rosalio Loud on 08/16/2022 12:39:39 -------------------------------------------------------------------------------- HPI Details Patient Name: Date of Service: [name], Wanda UDREY [name]. 08/16/2022 12:00 PM Medical Record Number: [id number] Patient Account Number: [id number] Date of Birth/Sex: Treating RN: July 30, 1940 (82 y.o. [name] Primary Care Provider: Tracie Harrier Other Clinician: Referring Provider: Treating Provider/Extender: Solon Palm Weeks [name]: 5 History of Present Illness HPI Description: 07-12-2022 upon evaluation today patient presents for initial inspection concerning issues she has been having with wounds on her left lower extremity. She thinks she may have hit the lateral portion of her leg on the dishwasher door but the other she thinks may have been Wanda tape injury. It is more of Wanda blister type situation. With that being said she tells me that she is having some discomfort although is not terrible at this point. Fortunately I do not see any signs of infection locally or systemically at this time which is great news. No fevers, chills, nausea, vomiting, or diarrhea. Patient does have Wanda history of diabetes mellitus type 2, lymphedema for which she is not really able to wear compression socks that she tells me she cannot get them on. She also has hypertension, congestive heart failure, and chronic kidney disease stage III. 07-26-2022 upon evaluation today patient appears  to be doing well currently [name]. The compression wrapping does seem to be helping I am good I perform some debridement [name] but the other wound is actually looking quite well. I think we are headed [name] the right direction. Of note she did put her compression wrap down she was stating  that this was getting too tight. 08-02-2022 upon evaluation today patient appears to be doing well currently [name]. She is actually showing signs of improvement [name] regard to both and I am extremely pleased with where we stand today. I do not see any evidence of active infection locally nor systemically which is great news. No fevers, chills, nausea, vomiting, or diarrhea. 08-09-2022 upon evaluation today patient appears to be doing well currently [name]. She has been tolerating the dressing changes without complication and overall 1 wound is healed the other is doing much better. Fortunately I do not see any signs of active infection locally or systemically which is great news. 08-16-2022 upon evaluation today patient appears to be doing well currently [name] although this is still showing some signs of being slow to heal. She keeps pushing the compression wrap down she tells me it gets too tight at the top we try to make some adjustments but is just not working for her. With that being said I think that we are probably going to switch to Tubigrip since she is not able to keep this up on more concerned about it causing additional damage to her leg and obviously is not helping it heal either if that is what is happening. Electronic Signature(s) Signed: 08/16/2022 12:52:37 PM By: Worthy Keeler PA-C Entered By: Worthy Keeler on 08/16/2022 12:52:37 [name], [name] ([id number]) [id number] Page 3 of 8 -------------------------------------------------------------------------------- Physical Exam  Details Patient Name: Date of Service: [name], [name]. 08/16/2022 12:00 PM Medical Record Number: [id number] Patient Account Number: [id number] Date of Birth/Sex: Treating RN: 1941/06/19 (82 y.o. [name] Primary Care Provider: Tracie Harrier Other Clinician: Referring Provider: Treating Provider/Extender: Solon Palm Weeks [name]: 5 Constitutional Well-nourished and well-hydrated [name] no acute distress. Respiratory normal breathing without difficulty. Psychiatric this patient is able to make decisions and demonstrates good insight into disease process. Alert and Oriented x 3. pleasant and cooperative. Notes Upon inspection patient's wound bed actually showed signs of good granulation epithelization at this point. Fortunately I do not see any signs of infection locally or systemically which is great news and I am very pleased [name] that regard I did perform debridement clearway some of the necrotic debris she tolerated that today without complication and postdebridement the wound bed is significantly improved which is great news. Electronic Signature(s) Signed: 08/16/2022 12:52:58 PM By: Worthy Keeler PA-C Entered By: Worthy Keeler on 08/16/2022 12:52:58 -------------------------------------------------------------------------------- Physician Orders Details Patient Name: Date of Service: [name], Wanda UDREY [name]. 08/16/2022 12:00 PM Medical Record Number: [id number] Patient Account Number: [id number] Date of Birth/Sex: Treating RN: 1941/01/30 (82 y.o. [name] Primary Care Provider: Tracie Harrier Other Clinician: Referring Provider: Treating Provider/Extender: Solon Palm Weeks [name]: 5 Verbal / Phone Orders: No Diagnosis Coding ICD-10 Coding Code Description E11.622 Type 2 diabetes mellitus with other skin ulcer I89.0 Lymphedema, not elsewhere classified L97.822 Non-pressure chronic ulcer of other part of left  lower leg with fat layer exposed I10 Essential (primary) hypertension I50.42 Chronic combined systolic (congestive) and diastolic (congestive) heart failure N18.30 Chronic kidney disease, stage 3 unspecified [name], [name] ([id number]) [id number] Page 4 of 8 Follow-up Appointments Return Appointment [name] 1 week. Bathing/ [employer] wounds with antibacterial soap and water. May shower; gently cleanse wound with antibacterial soap, rinse and pat dry prior to dressing wounds Anesthetic (Use 'Patient Medications' Section for Anesthetic  Order Entry) Lidocaine applied to wound bed Edema Control - Lymphedema / Segmental Compressive Device / Other Tubigrip single layer applied. - D Elevate, Exercise Daily and Wanda void Standing for Long Periods of Time. Elevate legs to the level of the heart and pump ankles as often as possible Elevate leg(s) parallel to the floor when sitting. Wound Treatment Wound #2 - Lower Leg Wound Laterality: Left, Lateral Cleanser: Soap and Water 3 x Per Week/15 Days Discharge Instructions: Gently cleanse wound with antibacterial soap, rinse and pat dry prior to dressing wounds Prim Dressing: Hydrofera Blue Ready Transfer Foam, 2.5x2.5 ([name]/[name]) 3 x Per Week/15 Days ary Discharge Instructions: Apply Hydrofera Blue Ready to wound bed as directed Secondary Dressing: [place] Dressing, 4x4 ([name]/[name]) (DME) (Generic) 3 x Per Week/15 Days elfa Discharge Instructions: Apply over dressing to secure [name] place. Secured With: Tubigrip Size D, 3x10 ([name]/yd) (Generic) 3 x Per Week/15 Days Electronic Signature(s) Unsigned Previous Signature: 08/16/2022 1:17:52 PM Version By: Worthy Keeler PA-C Previous Signature: 08/16/2022 1:32:16 PM Version By: Rosalio Loud MSN RN CNS WTA Entered By: Gretta Cool, BSN, RN, CWS, Kim on 08/16/2022 17:03:11 -------------------------------------------------------------------------------- Problem List Details Patient  Name: Date of Service: [facility] [name], Wanda UDREY [name]. 08/16/2022 12:00 PM Medical Record Number: [id number] Patient Account Number: [id number] Date of Birth/Sex: Treating RN: 1941-02-20 (82 y.o. 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/06/2023 No Yes [name], [name] ([id number]) [id number] Page 5 of 8 I10 Essential (primary) hypertension 07/12/2022 No Yes I50.42 Chronic combined systolic (congestive) and diastolic (congestive) heart failure 07/12/2022 No Yes N18.30 Chronic kidney disease, stage 3 unspecified 07/12/2022 No Yes Inactive Problems Resolved Problems Electronic Signature(s) Signed: 08/16/2022 12:32:57 PM By: Worthy Keeler PA-C Entered By: Worthy Keeler on 08/16/2022 12:32:56 -------------------------------------------------------------------------------- Progress Note Details Patient Name: Date of Service: [name], Wanda UDREY [name]. 08/16/2022 12:00 PM Medical Record Number: [id number] Patient Account Number: [id number] Date of Birth/Sex: Treating RN: [birth year] (82 y.o. [name] Primary Care Provider: Tracie Harrier Other Clinician: Referring Provider: Treating Provider/Extender: Solon Palm Weeks [name]: 5 Subjective Chief Complaint Information obtained from Patient Left LE Ulcers History of Present Illness (HPI) 07-12-2022 upon evaluation today patient presents for initial inspection concerning issues she has been having with wounds on her left lower extremity. She thinks she may have hit the  lateral portion of her leg on the dishwasher door but the other she thinks may have been Wanda tape injury. It is more of Wanda blister type situation. With that being said she tells me that she is having some discomfort although is not terrible at this point. Fortunately I do not see any signs of infection locally or systemically at this time which is great news. No fevers, chills, nausea, vomiting, or diarrhea. Patient does have Wanda history of diabetes mellitus type 2, lymphedema for which she is not really able to wear compression socks that she tells me she cannot get them on. She also has hypertension, congestive heart failure, and chronic kidney disease stage III. 07-26-2022 upon evaluation today patient appears to be doing well currently [name]. The compression wrapping does seem to  be helping I am good I perform some debridement [name] but the other wound is actually looking quite well. I think we are headed [name] the right direction. Of note she did put her compression wrap down she was stating that this was getting too tight. 08-02-2022 upon evaluation today patient appears to be doing well currently [name]. She is actually showing signs of improvement [name] regard to both and I am extremely pleased with where we stand today. I do not see any evidence of active infection locally nor systemically which is great news. No fevers, chills, nausea, vomiting, or diarrhea. 08-09-2022 upon evaluation today patient appears to be doing well currently [name]. She has been tolerating the dressing changes without complication and overall 1 wound is healed the other is doing much better. Fortunately I do not see any signs of active infection locally or systemically which is great news. 08-16-2022 upon evaluation today patient appears to be doing well currently [name] although this is still showing some signs of being slow to heal. She  keeps pushing the compression wrap down she tells me it gets too tight at the top we try to make some adjustments but is just not working for her. With that being said I think that we are probably going to switch to Tubigrip since she is not able to keep this up on more concerned about it causing additional damage to her leg and obviously is not helping it heal either if that is what is happening. [name], [name] ([id number]) [id number] Page 6 of 8 Objective Constitutional Well-nourished and well-hydrated [name] no acute distress. Vitals Time Taken: 12:07 PM, Height: 69 [name], Weight: 250 lbs, BMI: 36.9, Temperature: 97.8 F, Pulse: 62 bpm, Respiratory Rate: 16 breaths/min, Blood Pressure: 141/61 mmHg. Respiratory normal breathing without difficulty. Psychiatric this patient is able to make decisions and demonstrates good insight into disease process. Alert and Oriented x 3. pleasant and cooperative. General Notes: Upon inspection patient's wound bed actually showed signs of good granulation epithelization at this point. Fortunately I do not see any signs of infection locally or systemically which is great news and I am very pleased [name] that regard I did perform debridement clearway some of the necrotic debris she tolerated that today without complication and postdebridement the wound bed is significantly improved which is great news. Integumentary (Hair, Skin) Wound #2 status is Open. Original cause of wound was Gradually Appeared. The date acquired was: 05/01/2022. The wound has been [name] 5 weeks. The wound is located on the Left,Lateral Lower Leg. The wound measures 1cm length x 0.7cm width x 0.2cm depth; 0.55cm^2 area and 0.11cm^3 volume. There is Wanda medium amount of serosanguineous drainage noted. There is no granulation within the wound bed. There is Wanda large (67-100%) amount of necrotic tissue within the wound bed including Adherent Slough. Assessment Active  Problems ICD-10 Type 2 diabetes mellitus with other skin ulcer Lymphedema, not elsewhere classified Non-pressure chronic ulcer of other part of left lower leg with fat layer exposed Essential (primary) hypertension Chronic combined systolic (congestive) and diastolic (congestive) heart failure Chronic kidney disease, stage 3 unspecified Procedures Wound #2 Pre-procedure diagnosis of Wound #2 is Wanda Diabetic Wound/Ulcer of the Lower Extremity located on the Left,Lateral Lower Leg .Severity of Tissue Pre Debridement is: Fat layer exposed. There was Wanda Excisional Skin/Subcutaneous Tissue Debridement with Wanda total area of 0.7 sq cm performed by Joaquim Lai,  Shirlee Latch., PA-C. With the following instrument(s): Curette to remove Viable and Non-Viable tissue/material. Material removed includes Subcutaneous Tissue and Slough and. No specimens were taken. Wanda time out was conducted at 12:37, prior to the start of the procedure. Wanda Minimum amount of bleeding was controlled with Pressure. The procedure was tolerated well. Post Debridement Measurements: 1cm length x 0.7cm width x 0.3cm depth; 0.165cm^3 volume. Character of Wound/Ulcer Post Debridement is stable. Severity of Tissue Post Debridement is: Fat layer exposed. Post procedure Diagnosis Wound #2: Same as Pre-Procedure Plan Follow-up Appointments: Return Appointment [name] 1 week. Bathing/ Shower/ Hygiene: Wash wounds with antibacterial soap and water. May shower; gently cleanse wound with antibacterial soap, rinse and pat dry prior to dressing wounds Anesthetic (Use 'Patient Medications' Section for Anesthetic Order Entry): Lidocaine applied to wound bed Edema Control - Lymphedema / Segmental Compressive Device / Other: Tubigrip single layer applied. - D Elevate, Exercise Daily and Avoid Standing for Long Periods of Time. Elevate legs to the level of the heart and pump ankles as often as possible Elevate leg(s) parallel to the floor when sitting. WOUND #2: -  Lower Leg Wound Laterality: Left, Lateral Cleanser: Soap and Water 1 x Per Week/30 Days Discharge Instructions: Gently cleanse wound with antibacterial soap, rinse and pat dry prior to dressing wounds Prim Dressing: Hydrofera Blue Ready Transfer Foam, 2.5x2.5 ([name]/[name]) 1 x Per Week/30 Days ary [name], [name] ([id number]) [id number] Page 7 of 8 Discharge Instructions: Apply Hydrofera Blue Ready to wound bed as directed Secured With: Tegaderm Film 4x4 ([name]/[name]) 1 x Per Week/30 Days Discharge Instructions: Apply to wound bed Secured With: Tubigrip Size D, 3x10 ([name]/yd) 1 x Per Week/30 Days 1. I am going to suggest that we have the patient continue to monitor for any signs of infection or worsening. Based on what I am seeing I do think that she is doing well and hopeful after the debridement today and switching to the Tubigrip which hopefully should be able to keep up where it needs to be more effectively that we should see things improved significantly. 2. Also can recommend that we have the patient continue with the [facility] which I do believe is doing Wanda really good job here. 3. We will continue with the bordered gauze dressing to cover which I think would do Wanda good job for her as well. We will see patient back for reevaluation [name] 1 week here [name] the clinic. If anything worsens or changes patient will contact our office for additional recommendations. Electronic Signature(s) Signed: 08/16/2022 12:53:37 PM By: Worthy Keeler PA-C Entered By: Worthy Keeler on 08/16/2022 12:53:37 -------------------------------------------------------------------------------- SuperBill Details Patient Name: Date of Service: [name], [place]. 08/16/2022 Medical Record Number: [id number] Patient Account Number: [id number] Date of Birth/Sex: Treating RN: [birth year] (82 y.o. [name] Primary Care Provider: Tracie Harrier Other Clinician: Referring Provider: Treating  Provider/Extender: Solon Palm Weeks [name]: 5 Diagnosis Coding ICD-10 Codes Code Description E11.622 Type 2 diabetes mellitus with other skin ulcer I89.0 Lymphedema, not elsewhere classified L97.822 Non-pressure chronic ulcer of other part of left lower leg with fat layer exposed I10 Essential (primary) hypertension I50.42 Chronic combined systolic (congestive) and diastolic (congestive) heart failure N18.30 Chronic kidney disease, stage 3 unspecified Facility Procedures : CPT4 Code: [id number] Description: 11042 - DEB SUBQ TISSUE 20 SQ CM/< ICD-10 Diagnosis Description L97.822 Non-pressure chronic ulcer of other part of left lower leg with fat layer expos Modifier: ed Quantity: 1 Physician Procedures : [id number]  Code Description Modifier [id number] - WC PHYS SUBQ TISS 20 SQ CM ICD-10 Diagnosis Description L97.822 Non-pressure chronic ulcer of other part of left lower leg with fat layer exposed Quantity: 1 Electronic Signature(s) Signed: 08/16/2022 12:55:38 PM By: Worthy Keeler PA-C Entered By: Worthy Keeler on 08/16/2022 12:55:37 [name], [name] ([id number]) [id number] Page 8 of 8

## 2022-08-23 ENCOUNTER — Encounter: Payer: Medicare Other | Admitting: Physician Assistant

## 2022-08-23 DIAGNOSIS — E11622 Type 2 diabetes mellitus with other skin ulcer: Secondary | ICD-10-CM | POA: Diagnosis not present

## 2022-08-24 NOTE — Progress Notes (Addendum)
[name], [name] ([id number]) [id number] Page 1 of 7 Visit Report for 08/23/2022 Chief Complaint Document Details Patient Name: Date of Service: [name] 08/23/2022 12:00 PM Medical Record Number: [id number] Patient Account Number: [id number] Date of Birth/Sex: Treating RN: 06/30/1941 (82 y.o. [name] Primary Care Provider: Tracie [name] Other Clinician: Referring Provider: Treating Provider/Extender: [name] Weeks in Treatment: 6 Information Obtained from: Patient Chief Complaint Left LE Ulcers Electronic Signature(s) Signed: 08/23/2022 12:13:18 PM By: Wanda Keeler PA-C Entered By: [name] on 08/23/2022 12:13:18 -------------------------------------------------------------------------------- HPI Details Patient Name: Date of Service: [facility] [place], [place] [name] 08/23/2022 12:00 PM Medical Record Number: [id number] Patient Account Number: [id number] Date of Birth/Sex: Treating RN: [birth year] (82 y.o. [name] Primary Care Provider: Tracie [name] Other Clinician: Referring Provider: Treating Provider/Extender: [name] Weeks in Treatment: 6 History of Present Illness HPI Description: 07-12-2022 upon evaluation today patient presents for initial inspection concerning issues she has been having with wounds on her left lower extremity. She thinks she may have hit the lateral portion of her leg on the dishwasher door but the other she thinks may have been a tape injury. It is more of a blister type situation. With that being said she tells me that she is having some discomfort although is not terrible at this point. Fortunately I do not see any signs of infection locally or systemically at this time which is great news. No fevers, chills, nausea, vomiting, or diarrhea. Patient does have a history of diabetes mellitus type 2, lymphedema for which she is not really able to wear compression  socks that she tells me she cannot get them on. She also has hypertension, congestive heart failure, and chronic kidney disease stage III. 07-26-2022 upon evaluation today patient appears to be doing well currently in regard to her wound. The compression wrapping does seem to be helping I am good I perform some debridement in regard to the left lateral leg but the other wound is actually looking quite well. I think we are headed in the right direction. Of note she did put her compression wrap down she was stating that this was getting too tight. 08-02-2022 upon evaluation today patient appears to be doing well currently in regard to her wound. She is actually showing signs of improvement in regard to both and I am extremely pleased with where we stand today. I do not see any evidence of active infection locally nor systemically which is great news. No fevers, chills, nausea, vomiting, or diarrhea. 08-09-2022 upon evaluation today patient appears to be doing well currently in regard to her wound. She has been tolerating the dressing changes without [name] ([id number]) [id number] Page 2 of 7 complication and overall 1 wound is healed the other is doing much better. Fortunately I do not see any signs of active infection locally or systemically which is great news. 08-16-2022 upon evaluation today patient appears to be doing well currently in regard to her wound although this is still showing some signs of being slow to heal. She keeps pushing the compression wrap down she tells me it gets too tight at the top we try to make some adjustments but is just not working for her. With that being said I think that we are probably going to switch to Tubigrip since she is not able to keep this up on more concerned about it causing additional damage to her leg and obviously is not helping it  heal either if that is what is happening. 08-23-2022 upon evaluation today patient appears to  be doing well currently in regard to her wound. This is showing signs of some improvement but fortunately though it is doing better and unfortunately is still showing a lot of swelling in regard to her leg we have been having a hard time getting something on what she is able to keep on she is having a lot of discomfort a lot of ways. Fortunately I do not see any signs of active infection locally nor systemically which is great news. No fevers, chills, nausea, vomiting, or diarrhea. Electronic Signature(s) Signed: 08/23/2022 12:35:03 PM By: Wanda Keeler PA-C Entered By: [name] on 08/23/2022 12:35:02 -------------------------------------------------------------------------------- Physical Exam Details Patient Name: Date of Service: [name] 08/23/2022 12:00 PM Medical Record Number: [id number] Patient Account Number: [id number] Date of Birth/Sex: Treating RN: 05-29-1941 (82 y.o. [name] Primary Care Provider: Tracie [name] Other Clinician: Referring Provider: Treating Provider/Extender: [name] Weeks in Treatment: 6 Constitutional Well-nourished and well-hydrated in no acute distress. Respiratory normal breathing without difficulty. Psychiatric this patient is able to make decisions and demonstrates good insight into disease process. Alert and Oriented x 3. pleasant and cooperative. Notes Upon inspection patient's wound actually showed signs of some better granulation there was actually no need for sharp debridement today as she seems to be doing quite well which is great news. Electronic Signature(s) Signed: 08/23/2022 1:07:54 PM By: Wanda Keeler PA-C Entered By: [name] on 08/23/2022 13:07:54 -------------------------------------------------------------------------------- Physician Orders Details Patient Name: Date of Service: [name], [place] [name] 08/23/2022 12:00 PM Medical Record Number: [id number] Patient Account  Number: [id number] Date of Birth/Sex: Treating RN: 05-May-1941 (82 y.o. [name] Primary Care Provider: Tracie [name] Other Clinician: [name], Wanda ([id number]) [id number] Page 3 of 7 Referring Provider: Treating Provider/Extender: Nadara Mustard, Vishwanath Weeks in Treatment: 6 Verbal / Phone Orders: No Diagnosis Coding ICD-10 Coding Code Description E11.622 Type 2 diabetes mellitus with other skin ulcer I89.0 Lymphedema, not elsewhere classified L97.822 Non-pressure chronic ulcer of other part of left lower leg with fat layer exposed I10 Essential (primary) hypertension I50.42 Chronic combined systolic (congestive) and diastolic (congestive) heart failure N18.30 Chronic kidney disease, stage 3 unspecified Follow-up Appointments Return Appointment in 1 week. Bathing/ [employer] wounds with antibacterial soap and water. May shower; gently cleanse wound with antibacterial soap, rinse and pat dry prior to dressing wounds Anesthetic (Use 'Patient Medications' Section for Anesthetic Order Entry) Lidocaine applied to wound bed Edema Control - Lymphedema / Segmental Compressive Device / Other Elevate, Exercise Daily and A void Standing for Long Periods of Time. Elevate legs to the level of the heart and pump ankles as often as possible Elevate leg(s) parallel to the floor when sitting. Wound Treatment Wound #2 - Lower Leg Wound Laterality: Left, Lateral Cleanser: Soap and Water 3 x Per Week/15 Days Discharge Instructions: Gently cleanse wound with antibacterial soap, rinse and pat dry prior to dressing wounds Prim Dressing: Hydrofera Blue Ready Transfer Foam, 2.5x2.5 (in/in) 3 x Per Week/15 Days ary Discharge Instructions: Apply Hydrofera Blue Ready to wound bed as directed Secondary Dressing: T Adhesive [employer], 4x4 (in/in) (Generic) 3 x Per Week/15 Days elfa Discharge Instructions: Apply over dressing to secure in  place. Compression Stockings: Circaid Juxta Lite Compression Wrap (DME) Left Leg Compression Amount: 30-40 mmHG Discharge Instructions: Apply Circaid Juxta Lite Compression Wrap as directed Electronic Signature(s) Signed: 08/23/2022  1:45:46 PM By: Wanda Keeler PA-C Signed: 08/23/2022 5:35:32 PM By: Rosalio Loud MSN RN CNS WTA Entered By: Rosalio Loud on 08/23/2022 12:41:55 -------------------------------------------------------------------------------- Problem List Details Patient Name: Date of Service: [facility] [place], [place] [name] 08/23/2022 12:00 PM Medical Record Number: [id number] Patient Account Number: [id number] Date of Birth/Sex: Treating RN: May 08, 1941 (82 y.o. 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 08/23/2022 12:00 PM Medical Record Number: [id number] Patient Account Number: [id number] Date of Birth/Sex: Treating  RN: January 07, 1941 (82 y.o. [name] Primary Care Provider: Tracie [name] Other Clinician: Referring Provider: Treating Provider/Extender: [name] Weeks in Treatment: 6 Subjective Chief Complaint Information obtained from Patient Left LE Ulcers History of Present Illness (HPI) 07-12-2022 upon evaluation today patient presents for initial inspection concerning issues she has been having with wounds on her left lower extremity. She thinks she may have hit the lateral portion of her leg on the dishwasher door but the other she thinks may have been a tape injury. It is more of a blister type situation. With that being said she tells me that she is having some discomfort although is not terrible at this point. Fortunately I do not see any signs of infection locally or systemically at this time which is great news. No fevers, chills, nausea, vomiting, or diarrhea. Patient does have a history of diabetes mellitus type 2, lymphedema for which she is not really able to wear compression socks that she tells me she cannot get them on. She also has hypertension, congestive heart failure, and chronic kidney disease stage III. DELFA, PASTORIUS ([id number]) [id number] Page 5 of 7 07-26-2022 upon evaluation today patient appears to be doing well currently in regard to her wound. The compression wrapping does seem to be helping I am good I perform some debridement in regard to the left lateral leg but the other wound is actually looking quite well. I think we are headed in the right direction. Of note she did put her compression wrap down she was stating that this was getting too tight. 08-02-2022 upon evaluation today patient appears to be doing well currently in regard to her wound. She is actually showing signs of improvement in regard to both and I am extremely pleased with where we stand today. I do not see any evidence of active infection locally nor  systemically which is great news. No fevers, chills, nausea, vomiting, or diarrhea. 08-09-2022 upon evaluation today patient appears to be doing well currently in regard to her wound. She has been tolerating the dressing changes without complication and overall 1 wound is healed the other is doing much better. Fortunately I do not see any signs of active infection locally or  systemically which is great news. 08-16-2022 upon evaluation today patient appears to be doing well currently in regard to her wound although this is still showing some signs of being slow to heal. She keeps pushing the compression wrap down she tells me it gets too tight at the top we try to make some adjustments but is just not working for her. With that being said I think that we are probably going to switch to Tubigrip since she is not able to keep this up on more concerned about it causing additional damage to her leg and obviously is not helping it heal either if that is what is happening. 08-23-2022 upon evaluation today patient appears to be doing well currently in regard to her wound. This is showing signs of some improvement but fortunately though it is doing better and unfortunately is still showing a lot of swelling in regard to her leg we have been having a hard time getting something on what she is able to keep on she is having a lot of discomfort a lot of ways. Fortunately I do not see any signs of active infection locally nor systemically which is great news. No fevers, chills, nausea, vomiting, or diarrhea. Objective Constitutional Well-nourished and well-hydrated in no acute distress. Vitals Time Taken: 12:03 PM, Height: 69 in, Weight: 250 lbs, BMI: 36.9, Temperature: 98.1 F, Pulse: 57 bpm, Respiratory Rate: 16 breaths/min, Blood Pressure: 137/71 mmHg. Respiratory normal breathing without difficulty. Psychiatric this patient is able to make decisions and demonstrates good insight into disease process. Alert  and Oriented x 3. pleasant and cooperative. General Notes: Upon inspection patient's wound actually showed signs of some better granulation there was actually no need for sharp debridement today as she seems to be doing quite well which is great news. Integumentary (Hair, Skin) Wound #2 status is Open. Original cause of wound was Gradually Appeared. The date acquired was: 05/01/2022. The wound has been in treatment 6 weeks. The wound is located on the Left,Lateral Lower Leg. The wound measures 1.2cm length x 0.7cm width x 0.2cm depth; 0.66cm^2 area and 0.132cm^3 volume. There is a medium amount of serosanguineous drainage noted. There is no granulation within the wound bed. There is a large (67-100%) amount of necrotic tissue within the wound bed including Adherent Slough. Assessment Active Problems ICD-10 Type 2 diabetes mellitus with other skin ulcer Lymphedema, not elsewhere classified Non-pressure chronic ulcer of other part of left lower leg with fat layer exposed Essential (primary) hypertension Chronic combined systolic (congestive) and diastolic (congestive) heart failure Chronic kidney disease, stage 3 unspecified Plan Follow-up Appointments: Return Appointment in 1 week. Bathing/ Shower/ Hygiene: Wash wounds with antibacterial soap and water. May shower; gently cleanse wound with antibacterial soap, rinse and pat dry prior to dressing wounds Anesthetic (Use 'Patient Medications' Section for Anesthetic Order Entry): Lidocaine applied to wound bed Edema Control - Lymphedema / Segmental Compressive Device / Other: Elevate, Exercise Daily and Avoid Standing for Long Periods of Time. Elevate legs to the level of the heart and pump ankles as often as possible [place], [place] [name] ([id number]) [id number] Page 6 of 7 Elevate leg(s) parallel to the floor when sitting. WOUND #2: - Lower Leg Wound Laterality: Left, Lateral Cleanser: Soap and Water 3 x Per Week/15  Days Discharge Instructions: Gently cleanse wound with antibacterial soap, rinse and pat dry prior to dressing wounds Prim Dressing: Hydrofera Blue Ready Transfer Foam, 2.5x2.5 (in/in) 3 x Per Week/15 Days ary Discharge Instructions: Apply Hydrofera Blue Ready to wound  bed as directed Secondary Dressing: [employer] Dressing, 4x4 (in/in) (Generic) 3 x Per Week/15 Days elfa Discharge Instructions: Apply over dressing to secure in place. Com pression Stockings: Circaid Juxta Lite Compression Wrap (DME) Compression Amount: 30-40 mmHg (left) Discharge Instructions: Apply Circaid Juxta Lite Compression Wrap as directed 1. I am good recommend that we have the patient continue to monitor for any signs of worsening or infection. Obviously if anything changes she knows to contact the office and let me know. 2. I would recommend as well that the patient should continue to cover this with a border foam dressing. 3. I am also can recommend that we go ahead and see about ordering the juxta lite compression wrap for her which I think is still going to be the best way to go currently. 4. I am also can recommend the patient should continue to elevate her leg is much as possible to try to help with edema control. We will see patient back for reevaluation in 1 week here in the clinic. If anything worsens or changes patient will contact our office for additional recommendations. Electronic Signature(s) Signed: 08/23/2022 1:11:11 PM By: Wanda Keeler PA-C Entered By: [name] on 08/23/2022 13:11:11 -------------------------------------------------------------------------------- SuperBill Details Patient Name: Date of Service: [place], A Dreama Saa 08/23/2022 Medical Record Number: [id number] Patient Account Number: [id number] Date of Birth/Sex: Treating RN: 26-Jun-1941 (82 y.o. [name] Primary Care Provider: Tracie [name] Other Clinician: Referring Provider: Treating Provider/Extender:  [name] Weeks in Treatment: 6 Diagnosis Coding ICD-10 Codes Code Description E11.622 Type 2 diabetes mellitus with other skin ulcer I89.0 Lymphedema, not elsewhere classified L97.822 Non-pressure chronic ulcer of other part of left lower leg with fat layer exposed I10 Essential (primary) hypertension I50.42 Chronic combined systolic (congestive) and diastolic (congestive) heart failure N18.30 Chronic kidney disease, stage 3 unspecified Facility Procedures : CPT4 Code: [id number] Description: 99213 - WOUND CARE VISIT-LEV 3 EST PT Modifier: Quantity: 1 Physician Procedures : CPT4 Code Description Modifier [id number] - WC PHYS LEVEL 3 - EST PT ICD-10 Diagnosis Description E11.622 Type 2 diabetes mellitus with other skin ulcer I89.0 Lymphedema, not elsewhere classified L97.822 Non-pressure chronic ulcer of other part of  left lower leg with fat layer exposed I10 Essential (primary) hypertension [place], Patsy [name] ([id number]) [id number] Page 7 of Quantity: 1 7 Electronic Signature(s) Signed: 08/23/2022 1:11:36 PM By: Wanda Keeler PA-C Entered By: [name] on 08/23/2022 13:11:35

## 2022-08-24 NOTE — Progress Notes (Signed)
DMYA, HRUBES ([id number]) [phone].pdf Page 1 of 9 Visit Report for 08/23/2022 Arrival Information Details Patient Name: Date of Service: [name], [name] 08/23/2022 12:00 PM Medical Record Number: [id number] Patient Account Number: [id number] Date of Birth/Sex: Treating RN: 06/28/1941 (82 y.o. 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 08/23/2022 12:00 PM Medical Record Number: [id number] Patient Account Number: [id number] Date of Birth/Sex: Treating RN: 03-Aug-1940 (82 y.o. [name] Primary Care [name]: [name] Other Clinician: Referring [name]: Treating [name]: [name]: 6 Clinic Level of Care Assessment Items TOOL 4 Quantity Score X- 1 0 Use when only an EandM is performed on FOLLOW-UP visit ASSESSMENTS - Nursing Assessment / Reassessment X- 1 10 Reassessment of Co-morbidities (includes updates [name] patient status) X- 1 5 Reassessment of Adherence to Treatment Plan ASSESSMENTS - Wound and Skin Wanda  ssessment / Reassessment X - Simple Wound Assessment / Reassessment - one wound 1 5 [name], [name] ([id number]) [id number] Page 2 of 9 '[]'$  - 0 Complex Wound Assessment / Reassessment - multiple wounds '[]'$  - 0 Dermatologic / Skin Assessment (not related to wound area) ASSESSMENTS - Focused Assessment '[]'$  - 0 Circumferential Edema Measurements - multi extremities '[]'$  - 0 Nutritional Assessment / Counseling / Intervention '[]'$  - 0 Lower Extremity Assessment (monofilament, tuning fork, pulses) '[]'$  - 0 Peripheral Arterial Disease Assessment (using hand held doppler) ASSESSMENTS - Ostomy and/or Continence Assessment and Care '[]'$  - 0 Incontinence Assessment and Management '[]'$  - 0 Ostomy Care Assessment and Management (repouching, etc.) PROCESS - Coordination of Care X - Simple Patient / Family Education for ongoing care 1 15 '[]'$  - 0 Complex (extensive) Patient / Family Education for ongoing care X- 1 10 Staff obtains Programmer, systems, Records, T Results / Process Orders est '[]'$  - 0 Staff telephones HHA, Nursing Homes / Clarify orders / etc '[]'$  - 0 Routine Transfer to another Facility (non-emergent condition) '[]'$  - 0 Routine Hospital Admission (non-emergent condition) '[]'$  - 0 New Admissions / Biomedical engineer / Ordering NPWT Apligraf, etc. , '[]'$  - 0 Emergency Hospital Admission (emergent condition) X- 1 10 Simple Discharge Coordination '[]'$  - 0 Complex (extensive) Discharge Coordination PROCESS - Special Needs '[]'$  - 0 Pediatric / Minor Patient Management '[]'$  - 0 Isolation Patient Management '[]'$  - 0 Hearing / Language / Visual special needs '[]'$  - 0 Assessment of Community assistance (transportation, D/C planning, etc.) '[]'$  - 0 Additional assistance / Altered mentation '[]'$  - 0 Support Surface(s) Assessment (bed, cushion, seat, etc.) INTERVENTIONS - Wound Cleansing / Measurement X - Simple Wound Cleansing - one wound 1 5 '[]'$  - 0 Complex Wound Cleansing - multiple  wounds '[]'$  - 0 Wound Imaging (photographs - any number of wounds) '[]'$  - 0 Wound Tracing (instead of photographs) X- 1 5 Simple Wound Measurement - one wound '[]'$  -  0 Complex Wound Measurement - multiple wounds INTERVENTIONS - Wound Dressings X - Small Wound Dressing one or multiple wounds 1 10 '[]'$  - 0 Medium Wound Dressing one or multiple wounds '[]'$  - 0 Large Wound Dressing one or multiple wounds '[]'$  - 0 Application of Medications - topical '[]'$  - 0 Application of Medications - injection INTERVENTIONS - Miscellaneous '[]'$  - 0 External ear exam '[]'$  - 0 Specimen Collection (cultures, biopsies, blood, body fluids, etc.) '[]'$  - 0 Specimen(s) / Culture(s) sent or taken to Lab for analysis [name], [name] ([id number]) [id number] Page 3 of 9 '[]'$  - 0 Patient Transfer (multiple staff / Civil Service fast streamer / Similar devices) '[]'$  - 0 Simple Staple / Suture removal (25 or less) '[]'$  - 0 Complex Staple / Suture removal (26 or more) '[]'$  - 0 Hypo / Hyperglycemic Management (close monitor of Blood Glucose) '[]'$  - 0 Ankle / Brachial Index (ABI) - do not check if billed separately X- 1 5 Vital Signs Has the patient been seen at the hospital within the last three years: Yes Total Score: 80 Level Of Care: New/Established - Level 3 Electronic Signature(s) Signed: 08/23/2022 5:35:32 PM By: Wanda Loud MSN RN CNS WTA Entered By: [name] on 08/23/2022 12:28:20 -------------------------------------------------------------------------------- Encounter Discharge Information Details Patient Name: Date of Service: [name], [name] 08/23/2022 12:00 PM Medical Record Number: [id number] Patient Account Number: [id number] Date of Birth/Sex: Treating RN: 07/06/1940 (82 y.o. [name] Primary Care Breezy Hertenstein: [name] Other Clinician: Referring Ziomara Birenbaum: Treating Livianna Petraglia/Extender: [name]: 6 Encounter Discharge Information  Items Discharge Condition: Stable Ambulatory Status: Ambulatory Discharge Destination: Home Transportation: Private Auto Accompanied By: self Schedule Follow-up Appointment: Yes Clinical Summary of Care: Electronic Signature(s) Signed: 08/23/2022 5:35:32 PM By: Wanda Loud MSN RN CNS WTA Entered By: [name] on 08/23/2022 12:29:15 -------------------------------------------------------------------------------- Lower Extremity Assessment Details Patient Name: Date of Service: [facility] [name], [name] 08/23/2022 12:00 PM Medical Record Number: [id number] Patient Account Number: [id number] Date of Birth/Sex: Treating RN: 03-02-1941 (82 y.o. [name] Primary Care Jinger Middlesworth: [name] Other Clinician: LORI-ANNE, ABDALLA ([id number]) [id number] Page 4 of 9 Referring [place] Jarquin: Treating Alyla Pietila/Extender: Nadara Mustard, Vishwanath Weeks [name]: 6 Edema Assessment Assessed: [Left: No] [Right: No] [Left: Edema] [Right: :] Calf Left: Right: Point of Measurement: 34 cm From Medial Instep 41.5 cm Ankle Left: Right: Point of Measurement: 12 cm From Medial Instep 25.3 cm Knee To Floor Left: Right: From Medial Instep 46 cm Vascular Assessment Pulses: Dorsalis Pedis Palpable: [Left:Yes] Electronic Signature(s) Signed: 08/23/2022 12:40:11 PM By: Wanda Loud MSN RN CNS WTA Entered By: [name] on 08/23/2022 12:40:11 -------------------------------------------------------------------------------- Multi Wound Chart Details Patient Name: Date of Service: [name], [name] 08/23/2022 12:00 PM Medical Record Number: [id number] Patient Account Number: [id number] Date of Birth/Sex: Treating RN: 13-Jun-1941 (82 y.o. 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/Wanda Primary Etiology: Extremity Lymphedema, Congestive Heart N/Wanda N/Wanda Comorbid History: Failure, Hypertension, Type II Diabetes 05/01/2022 N/Wanda N/Wanda Date Acquired: 6 N/Wanda N/Wanda Weeks of Treatment: Open N/Wanda N/Wanda Wound Status: No N/Wanda N/Wanda Wound Recurrence: 1.2x0.7x0.2  N/Wanda N/Wanda Measurements L x W x D (cm) 0.66 N/Wanda N/Wanda Wanda (cm) : rea 0.132 N/Wanda N/Wanda Volume (cm) : 76.70% N/Wanda N/Wanda % Reduction [name] Wanda rea: 76.60% N/Wanda N/Wanda % Reduction [name] Volume: Grade 2 N/Wanda N/Wanda Classification: Medium N/Wanda N/Wanda Exudate Wanda mount: Serosanguineous N/Wanda N/Wanda Exudate Type: red, brown N/Wanda N/Wanda Exudate Color: None Present (0%) N/Wanda N/Wanda Granulation Wanda mount: Large (67-100%) N/Wanda N/Wanda Necrotic Wanda mount: Fascia: No N/Wanda N/Wanda Exposed Structures: Fat Layer (Subcutaneous Tissue): No Tendon: No Muscle: No Joint: No Bone: No None N/Wanda N/Wanda Epithelialization: Treatment Notes Electronic Signature(s) Signed: 08/23/2022 5:35:32 PM By: Wanda Loud MSN RN CNS WTA Entered By: [name] on 08/23/2022 12:20:03 -------------------------------------------------------------------------------- Multi-Disciplinary Care Plan Details Patient Name: Date of Service: [facility] [name], [name] 08/23/2022 12:00 PM Medical Record Number: [id number] Patient Account Number: [id number] Date of Birth/Sex: Treating RN: 28-Jan-1941 (82 y.o. [name] Primary Care Charlayne Vultaggio: [name] Other Clinician: Referring Wendi Lastra: Treating Ayleen Mckinstry/Extender: [name]: 6 Active Inactive Necrotic Tissue Nursing Diagnoses: Knowledge deficit related to management of  necrotic/devitalized tissue Goals: Patient/caregiver will verbalize understanding of reason and process for debridement of necrotic tissue Date Initiated: 07/12/2022 Target Resolution Date: 08/12/2022 Goal Status: Active Interventions: Assess patient pain level pre-, during and post procedure and prior to discharge Provide education on necrotic tissue and debridement process Notes: [name], [name] ([id number]) [id number] Page 6 of 9 Wound/Skin Impairment Nursing Diagnoses: Knowledge deficit related to ulceration/compromised skin integrity Goals: Patient/caregiver will verbalize understanding of skin care regimen Date Initiated: 07/12/2022 Target Resolution Date: 08/12/2022 Goal Status: Active Ulcer/skin breakdown will have Wanda volume reduction of 30% by week 4 Date Initiated: 07/12/2022 Target Resolution Date: 08/12/2022 Goal Status: Active Ulcer/skin breakdown will have Wanda volume reduction of 50% by week 8 Date Initiated: 07/12/2022 Target Resolution Date: 09/10/2022 Goal Status: Active Ulcer/skin breakdown will have Wanda volume reduction of 80% by week 12 Date Initiated: 07/12/2022 Target Resolution Date: 10/11/2022 Goal Status: Active Ulcer/skin breakdown will heal within 14 weeks Date Initiated: 07/12/2022 Target Resolution Date: 11/10/2022 Goal Status: Active Interventions: Assess patient/caregiver ability to obtain necessary supplies Assess patient/caregiver ability to perform ulcer/skin care regimen upon admission and as needed Assess ulceration(s) every visit Notes: Electronic Signature(s) Signed: 08/23/2022 5:35:32 PM By: Wanda Loud MSN RN CNS WTA Entered By: [name] on 08/23/2022 12:28:29 -------------------------------------------------------------------------------- Pain Assessment Details Patient Name: Date of Service: [name], [name] 08/23/2022 12:00 PM Medical Record Number: [id number] Patient Account Number: [id number] Date of  Birth/Sex: Treating RN: Jul 21, 1940 (82 y.o. [name] Primary Care Sharni Negron: [name] Other Clinician: Referring Alysa Duca: Treating Dali Kraner/Extender: [name]: 6 Active Problems Location of Pain Severity and Description of Pain Patient Has Paino No Site Locations [place], Shariyah [name] ([id number]) [phone].pdf Page 7 of 9 Pain Management and Medication Current Pain Management: Electronic Signature(s) Signed: 08/23/2022 5:35:32 PM By: Wanda Loud MSN RN CNS WTA Entered By: [name] on 08/23/2022 12:03:36 -------------------------------------------------------------------------------- Patient/Caregiver Education Details Patient Name: Date of Service: [address] [name] 2/23/2024andnbsp12:00 PM Medical Record Number: [id number] Patient Account Number: [id number] Date of Birth/Gender: Treating RN: [birth year] (82 y.o. [name] Primary Care Physician: [name] Other Clinician: Referring Physician: Treating Physician/Extender: [name]: 6 Education Assessment Education Provided To: Patient Education Topics Provided Wound/Skin Impairment: Handouts: Caring for Your Ulcer Methods: Explain/Verbal Responses: State content correctly Electronic Signature(s) Signed: 08/23/2022 5:35:32 PM By: Wanda Loud MSN RN  CNS WTA Entered By: [name] on 08/23/2022 12:28:41 Delaurentis, Derrill Memo ([id number] Page 8 of 9 -------------------------------------------------------------------------------- Wound Assessment Details Patient Name: Date of Service: [place], [name] 08/23/2022 12:00 PM Medical Record Number: [id number] Patient Account Number: [id number] Date of Birth/Sex: Treating RN: 08/27/1940 (82 y.o. [name] Primary Care Real Cona: [name] Other Clinician: Referring Dhara Schepp: Treating Maridee Slape/Extender:  [name]: 6 Wound Status Wound Number: 2 Primary Diabetic Wound/Ulcer of the Lower Extremity Etiology: Wound Location: Left, Lateral Lower Leg Wound Status: Open Wounding Event: Gradually Appeared Comorbid Lymphedema, Congestive Heart Failure, Hypertension, Type Date Acquired: 05/01/2022 History: II Diabetes Weeks Of Treatment: 6 Clustered Wound: No Photos Wound Measurements Length: (cm) 1.2 Width: (cm) 0.7 Depth: (cm) 0.2 Area: (cm) 0.66 Volume: (cm) 0.132 % Reduction [name] Area: 76.7% % Reduction [name] Volume: 76.6% Epithelialization: None Wound Description Classification: Grade 2 Exudate Amount: Medium Exudate Type: Serosanguineous Exudate Color: red, brown Foul Odor After Cleansing: No Slough/Fibrino Yes Wound Bed Granulation Amount: None Present (0%) Exposed Structure Necrotic Amount: Large (67-100%) Fascia Exposed: No Necrotic Quality: Adherent Slough Fat Layer (Subcutaneous Tissue) Exposed: No Tendon Exposed: No Muscle Exposed: No Joint Exposed: No Bone Exposed: No Treatment Notes Wound #2 (Lower Leg) Wound Laterality: Left, Lateral Cleanser Soap and Water Discharge Instruction: Gently cleanse wound with antibacterial soap, rinse and pat dry prior to dressing wounds Peri-Wound Care Dickenson, Orella [name] ([id number]) [id number] Page 9 of 9 Topical Primary Dressing Hydrofera Blue Ready Transfer Foam, 2.5x2.5 ([name]/[name]) Discharge Instruction: Apply Hydrofera Blue Ready to wound bed as directed Secondary Dressing T Adhesive [employer], 4x4 ([name]/[name]) elfa Discharge Instruction: Apply over dressing to secure [name] place. Secured With Compression Wrap Compression Stockings Circaid Juxta Lite Compression Wrap Quantity: 1 Left Leg Compression Amount: 30-40 mmHg Discharge Instruction: Apply Circaid Juxta Lite Compression Wrap as directed Add-Ons Electronic Signature(s) Signed: 08/23/2022 5:35:32 PM By:  Wanda Loud MSN RN CNS WTA Entered By: [name] on 08/23/2022 12:08:47 -------------------------------------------------------------------------------- Vitals Details Patient Name: Date of Service: [name], [name] 08/23/2022 12:00 PM Medical Record Number: [id number] Patient Account Number: [id number] Date of Birth/Sex: Treating RN: 1941/04/10 (82 y.o. [name] Primary Care Eunique Balik: [name] Other Clinician: Referring Debroh Sieloff: Treating Laparis Durrett/Extender: [name]: 6 Vital Signs Time Taken: 12:03 Temperature (F): 98.1 Height ([name]): 69 Pulse (bpm): 57 Weight (lbs): 250 Respiratory Rate (breaths/min): 16 Body Mass Index (BMI): 36.9 Blood Pressure (mmHg): 137/71 Reference Range: 80 - 120 mg / dl Electronic Signature(s) Signed: 08/23/2022 5:35:32 PM By: Wanda Loud MSN RN CNS WTA Entered By: [name] on 08/23/2022 12:03:23

## 2022-08-30 ENCOUNTER — Encounter: Payer: Medicare Other | Attending: Physician Assistant | Admitting: Physician Assistant

## 2022-08-30 DIAGNOSIS — L97822 Non-pressure chronic ulcer of other part of left lower leg with fat layer exposed: Secondary | ICD-10-CM | POA: Insufficient documentation

## 2022-08-30 DIAGNOSIS — I5042 Chronic combined systolic (congestive) and diastolic (congestive) heart failure: Secondary | ICD-10-CM | POA: Insufficient documentation

## 2022-08-30 DIAGNOSIS — I130 Hypertensive heart and chronic kidney disease with heart failure and stage 1 through stage 4 chronic kidney disease, or unspecified chronic kidney disease: Secondary | ICD-10-CM | POA: Diagnosis not present

## 2022-08-30 DIAGNOSIS — I890 Lymphedema, not elsewhere classified: Secondary | ICD-10-CM | POA: Diagnosis not present

## 2022-08-30 DIAGNOSIS — E11622 Type 2 diabetes mellitus with other skin ulcer: Secondary | ICD-10-CM | POA: Insufficient documentation

## 2022-08-30 DIAGNOSIS — N183 Chronic kidney disease, stage 3 unspecified: Secondary | ICD-10-CM | POA: Insufficient documentation

## 2022-08-30 DIAGNOSIS — E1122 Type 2 diabetes mellitus with diabetic chronic kidney disease: Secondary | ICD-10-CM | POA: Diagnosis not present

## 2022-09-02 NOTE — Progress Notes (Addendum)
KABREA, SEESE ([id number]) [id number] Page 1 of 8 Visit Report for 08/30/2022 Chief Complaint Document Details Patient Name: Date of Service: [name], [name] 08/30/2022 12:00 PM Medical Record Number: [id number] Patient Account Number: [id number] Date of Birth/Sex: Treating RN: 20-Dec-1940 (82 y.o. [name] Primary Care Provider: Tracie [name] Other Clinician: Massie [name] Referring Provider: Treating Provider/Extender: [name]: 7 Information Obtained from: Patient Chief Complaint Left LE Ulcers Electronic Signature(s) Signed: 08/30/2022 12:14:09 PM By: Wanda Keeler PA-C Entered By: [name] on 08/30/2022 12:14:09 -------------------------------------------------------------------------------- Debridement Details Patient Name: Date of Service: [name], [name] 08/30/2022 12:00 PM Medical Record Number: [id number] Patient Account Number: [id number] Date of Birth/Sex: Treating RN: 05-31-1941 (82 y.o. [name] Primary Care Provider: Tracie [name] Other Clinician: Massie [name] Referring Provider: Treating Provider/Extender: [name]: 7 Debridement Performed for Assessment: Wound #2 Left,Lateral Lower Leg Performed By: Physician [name], PA-C Debridement Type: Debridement Severity of Tissue Pre Debridement: Fat layer exposed Level of Consciousness (Pre-procedure): Awake and Alert Pre-procedure Verification/Time Out Yes - 12:35 Taken: Start Time: 12:35 T Area Debrided (L x W): otal 1.7 (cm) x 0.9 (cm) = 1.53 (cm) Tissue and other material debrided: Viable, Non-Viable, Slough, Subcutaneous, Slough Level: Skin/Subcutaneous Tissue Debridement Description: Excisional Instrument: Curette Bleeding: Minimum Hemostasis Achieved: Pressure Response to Treatment: Procedure was tolerated well Level of Consciousness (Post- Awake and  Alert procedure): [name], [name] [name] ([id number]) [id number] Page 2 of 8 Post Debridement Measurements of Total Wound Length: (cm) 1.7 Width: (cm) 0.9 Depth: (cm) 0.1 Volume: (cm) 0.12 Character of Wound/Ulcer Post Debridement: Stable Severity of Tissue Post Debridement: Fat layer exposed Post Procedure Diagnosis Same as Pre-procedure Electronic Signature(s) Signed: 08/30/2022 1:35:31 PM By: [name] Signed: 08/30/2022 1:46:09 PM By: Wanda Keeler PA-C Signed: 09/04/2022 9:49:41 PM By: [name], BSN, RN, CWS, Kim RN, BSN Entered By: [name] on 08/30/2022 12:36:31 -------------------------------------------------------------------------------- HPI Details Patient Name: Date of Service: [name], [name] 08/30/2022 12:00 PM Medical Record Number: [id number] Patient Account Number: [id number] Date of Birth/Sex: Treating RN: 07/31/1940 (82 y.o. [name] Primary Care Provider: Tracie [name] Other Clinician: Massie [name] Referring Provider: Treating Provider/Extender: [name]: 7 History of Present Illness HPI Description: 07-12-2022 upon evaluation today patient presents for initial inspection concerning issues she has been having with wounds on her left lower extremity. She thinks she may have hit the lateral portion of her leg on the dishwasher door but the other she thinks may have been Wanda tape injury. It is more of Wanda blister type situation. With that being said she tells me that she is having some discomfort although is not terrible at this point. Fortunately I do not see any signs of infection locally or systemically at this time which is great news. No fevers, chills, nausea, vomiting, or diarrhea. Patient does have Wanda history of diabetes mellitus type 2, lymphedema for which she is not really able to wear compression socks that she tells me she cannot get them on. She also has hypertension, congestive  heart failure, and chronic kidney disease stage III. 07-26-2022 upon evaluation today patient appears to be doing well currently [name]. The compression wrapping does seem to be helping I am good I perform some debridement [name] but the other wound is actually looking quite well. I think we are headed [name] the  right direction. Of note she did put her compression wrap down she was stating that this was getting too tight. 08-02-2022 upon evaluation today patient appears to be doing well currently [name]. She is actually showing signs of improvement [name] regard to both and I am extremely pleased with where we stand today. I do not see any evidence of active infection locally nor systemically which is great news. No fevers, chills, nausea, vomiting, or diarrhea. 08-09-2022 upon evaluation today patient appears to be doing well currently [name]. She has been tolerating the dressing changes without complication and overall 1 wound is healed the other is doing much better. Fortunately I do not see any signs of active infection locally or systemically which is great news. 08-16-2022 upon evaluation today patient appears to be doing well currently [name] although this is still showing some signs of being slow to heal. She keeps pushing the compression wrap down she tells me it gets too tight at the top we try to make some adjustments but is just not working for her. With that being said I think that we are probably going to switch to Tubigrip since she is not able to keep this up on more concerned about it causing additional damage to her leg and obviously is not helping it heal either if that is what is happening. 08-23-2022 upon evaluation today patient appears to be doing well currently [name]. This is showing signs of some improvement but fortunately though it is doing better and unfortunately is still showing  Wanda lot of swelling [name] regard to her leg we have been having Wanda hard time getting something on what she is able to keep on she is having Wanda lot of discomfort Wanda lot of ways. Fortunately I do not see any signs of active infection locally nor systemically which is great news. No fevers, chills, nausea, vomiting, or diarrhea. 08-30-2022 upon evaluation today patient appears to be doing well with regard to her wound. This is showing signs of being much smaller is doing quite well and overall I am extremely pleased with where we stand I do not see any signs of infection locally nor systemically at this point. Electronic Signature(s) Signed: 08/30/2022 1:35:25 PM By: Irean Hong Penn, Derrill Memo ([id number]) [id number] Page 3 of 8 Entered By: [name] on 08/30/2022 13:35:25 -------------------------------------------------------------------------------- Physical Exam Details Patient Name: Date of Service: [name], [name] 08/30/2022 12:00 PM Medical Record Number: [id number] Patient Account Number: [id number] Date of Birth/Sex: Treating RN: 1940/09/03 (82 y.o. [name] Primary Care Provider: Tracie [name] Other Clinician: Massie [name] Referring Provider: Treating Provider/Extender: [name]: 7 Constitutional Well-nourished and well-hydrated [name] no acute distress. Respiratory normal breathing without difficulty. Psychiatric this patient is able to make decisions and demonstrates good insight into disease process. Alert and Oriented x 3. pleasant and cooperative. Notes Based on what I am seeing I do feel like the patient's wound is doing quite well she does have some slough and biofilm I did perform debridement of clearway some of the necrotic debris she tolerated that today without complication and postdebridement the wound bed is significantly improved. Electronic Signature(s) Signed: 08/30/2022 1:37:05 PM By:  Wanda Keeler PA-C Entered By: [name] on 08/30/2022 13:37:05 -------------------------------------------------------------------------------- Physician Orders Details Patient Name: Date of Service: [name], [name] 08/30/2022 12:00 PM Medical Record Number:  [id number] Patient Account Number: [id number] Date of Birth/Sex: Treating RN: [birth year] (82 y.o. [name] Primary Care Provider: Tracie [name] Other Clinician: Massie [name] Referring Provider: Treating Provider/Extender: [name]: 7 Verbal / Phone Orders: No Diagnosis Coding ICD-10 Coding Code Description E11.622 Type 2 diabetes mellitus with other skin ulcer I89.0 Lymphedema, not elsewhere classified L97.822 Non-pressure chronic ulcer of other part of left lower leg with fat layer exposed I10 Essential (primary) hypertension [name], [name] ([id number]) [id number] Page 4 of 8 I50.42 Chronic combined systolic (congestive) and diastolic (congestive) heart failure N18.30 Chronic kidney disease, stage 3 unspecified Follow-up Appointments Return Appointment [name] 1 week. Bathing/ [employer] wounds with antibacterial soap and water. May shower; gently cleanse wound with antibacterial soap, rinse and pat dry prior to dressing wounds Anesthetic (Use 'Patient Medications' Section for Anesthetic Order Entry) Lidocaine applied to wound bed Edema Control - Lymphedema / Segmental Compressive Device / Other Elevate, Exercise Daily and Wanda void Standing for Long Periods of Time. Elevate legs to the level of the heart and pump ankles as often as possible Elevate leg(s) parallel to the floor when sitting. Wound Treatment Wound #2 - Lower Leg Wound Laterality: Left, Lateral Cleanser: Soap and Water 3 x Per Week/15 Days Discharge Instructions: Gently cleanse wound with antibacterial soap, rinse and pat dry prior to dressing wounds Prim Dressing:  Hydrofera Blue Ready Transfer Foam, 2.5x2.5 ([name]/[name]) 3 x Per Week/15 Days ary Discharge Instructions: Apply Hydrofera Blue Ready to wound bed as directed Secondary Dressing: T Adhesive [employer], 4x4 ([name]/[name]) (Generic) 3 x Per Week/15 Days elfa Discharge Instructions: Apply over dressing to secure [name] place. Compression Stockings: Circaid Juxta Lite Compression Wrap Left Leg Compression Amount: 30-40 mmHG Discharge Instructions: Apply Circaid Juxta Lite Compression Wrap as directed Electronic Signature(s) Signed: 08/30/2022 1:35:31 PM By: [name] Signed: 08/30/2022 1:46:09 PM By: Wanda Keeler PA-C Entered By: [name] on 08/30/2022 12:36:53 -------------------------------------------------------------------------------- Problem List Details Patient Name: Date of Service: [name], [name] 08/30/2022 12:00 PM Medical Record Number: [id number] Patient Account Number: [id number] Date of Birth/Sex: Treating RN: 1940-09-01 (82 y.o. 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 08/30/2022 12:00 PM Medical Record Number: [id number] Patient Account Number: [id number] Date of Birth/Sex: Treating RN: December 26, 1940 (82 y.o. [name] Primary Care Provider: Tracie [name] Other Clinician: Massie [name] Referring Provider: Treating Provider/Extender: [name]: 7 Subjective Chief Complaint Information obtained from Patient Left LE Ulcers History of Present Illness (HPI) 07-12-2022 upon evaluation today patient presents for initial inspection concerning issues she has been having with wounds on her left lower extremity. She thinks she may have  hit the lateral portion of her leg on the dishwasher door but the other she thinks may have been Wanda tape injury. It is more of Wanda blister type situation. With that being said she tells me that she is having some discomfort although is not terrible at this point. Fortunately I do not see any signs of infection locally or systemically at this time which is great news. No fevers, chills, nausea, vomiting, or diarrhea. Patient does have Wanda history of diabetes mellitus type 2, lymphedema for which she is not really able to wear compression socks that she tells me she cannot get them on. She also has hypertension, congestive heart failure, and chronic kidney disease stage III. 07-26-2022 upon evaluation today patient appears to be doing well currently [name]. The compression wrapping does seem to be helping I am good I perform some debridement [name] but the other wound is actually looking quite well. I think we are headed [name] the right direction. Of note she did put her compression wrap down she was stating that this was getting too tight. 08-02-2022 upon evaluation today patient appears to  be doing well currently [name]. She is actually showing signs of improvement [name] regard to both and I am extremely pleased with where we stand today. I do not see any evidence of active infection locally nor systemically which is great news. No fevers, chills, nausea, vomiting, or diarrhea. 08-09-2022 upon evaluation today patient appears to be doing well currently [name]. She has been tolerating the dressing changes without complication and overall 1 wound is healed the other is doing much better. Fortunately I do not see any signs of active infection locally or systemically which is great news. 08-16-2022 upon evaluation today patient appears to be doing well currently [name] although this is still showing some signs of being slow to heal. She keeps pushing the compression wrap down she tells me it gets too tight at the top we try to make some adjustments but is just not working for her. With that being said I think that we are probably going to switch to Tubigrip since she is not able to keep this up on more concerned about it causing additional damage to her leg and obviously is not helping it heal either if that is what is happening. [name], [name] ([id number]) [id number] Page 6 of 8 08-23-2022 upon evaluation today patient appears to be doing well currently [name]. This is showing signs of some improvement but fortunately though it is doing better and unfortunately is still showing Wanda lot of swelling [name] regard to her leg we have been having Wanda hard time getting something on what she is able to keep on she is having Wanda lot of discomfort Wanda lot of ways. Fortunately I do not see any signs of active infection locally nor systemically which is great news. No fevers, chills, nausea, vomiting, or diarrhea. 08-30-2022 upon evaluation today patient appears to be doing well with regard to her wound. This is showing signs of  being much smaller is doing quite well and overall I am extremely pleased with where we stand I do not see any signs of infection locally nor systemically at this point. Objective Constitutional Well-nourished and well-hydrated [name] no acute distress. Vitals Time Taken: 12:07 PM, Height: 69 [name], Weight: 250 lbs, BMI: 36.9, Temperature: 98.1 F, Pulse: 54  bpm, Respiratory Rate: 18 breaths/min, Blood Pressure: 160/81 mmHg. Respiratory normal breathing without difficulty. Psychiatric this patient is able to make decisions and demonstrates good insight into disease process. Alert and Oriented x 3. pleasant and cooperative. General Notes: Based on what I am seeing I do feel like the patient's wound is doing quite well she does have some slough and biofilm I did perform debridement of clearway some of the necrotic debris she tolerated that today without complication and postdebridement the wound bed is significantly improved. Integumentary (Hair, Skin) Wound #2 status is Open. Original cause of wound was Gradually Appeared. The date acquired was: 05/01/2022. The wound has been [name] 7 weeks. The wound is located on the Left,Lateral Lower Leg. The wound measures 1.7cm length x 0.9cm width x 0.1cm depth; 1.202cm^2 area and 0.12cm^3 volume. There is Wanda medium amount of serosanguineous drainage noted. There is no granulation within the wound bed. There is Wanda large (67-100%) amount of necrotic tissue within the wound bed including Adherent Slough. Assessment Active Problems ICD-10 Type 2 diabetes mellitus with other skin ulcer Lymphedema, not elsewhere classified Non-pressure chronic ulcer of other part of left lower leg with fat layer exposed Essential (primary) hypertension Chronic combined systolic (congestive) and diastolic (congestive) heart failure Chronic kidney disease, stage 3 unspecified Procedures Wound #2 Pre-procedure diagnosis of Wound #2 is Wanda Diabetic Wound/Ulcer of the Lower  Extremity located on the Left,Lateral Lower Leg .Severity of Tissue Pre Debridement is: Fat layer exposed. There was Wanda Excisional Skin/Subcutaneous Tissue Debridement with Wanda total area of 1.53 sq cm performed by [name], PA-C. With the following instrument(s): Curette to remove Viable and Non-Viable tissue/material. Material removed includes Subcutaneous Tissue and Slough and. Wanda time out was conducted at 12:35, prior to the start of the procedure. Wanda Minimum amount of bleeding was controlled with Pressure. The procedure was tolerated well. Post Debridement Measurements: 1.7cm length x 0.9cm width x 0.1cm depth; 0.12cm^3 volume. Character of Wound/Ulcer Post Debridement is stable. Severity of Tissue Post Debridement is: Fat layer exposed. Post procedure Diagnosis Wound #2: Same as Pre-Procedure Plan Follow-up Appointments: Return Appointment [name] 1 week. Bathing/ Shower/ Hygiene: Wash wounds with antibacterial soap and water. May shower; gently cleanse wound with antibacterial soap, rinse and pat dry prior to dressing wounds Anesthetic (Use 'Patient Medications' Section for Anesthetic Order Entry): SELIA, MCELMURRY ([id number]) [id number] Page 7 of 8 Lidocaine applied to wound bed Edema Control - Lymphedema / Segmental Compressive Device / Other: Elevate, Exercise Daily and Avoid Standing for Long Periods of Time. Elevate legs to the level of the heart and pump ankles as often as possible Elevate leg(s) parallel to the floor when sitting. WOUND #2: - Lower Leg Wound Laterality: Left, Lateral Cleanser: Soap and Water 3 x Per Week/15 Days Discharge Instructions: Gently cleanse wound with antibacterial soap, rinse and pat dry prior to dressing wounds Prim Dressing: Hydrofera Blue Ready Transfer Foam, 2.5x2.5 ([name]/[name]) 3 x Per Week/15 Days ary Discharge Instructions: Apply Hydrofera Blue Ready to wound bed as directed Secondary Dressing: [place] Dressing,  4x4 ([name]/[name]) (Generic) 3 x Per Week/15 Days elfa Discharge Instructions: Apply over dressing to secure [name] place. Com pression Stockings: Circaid Juxta Lite Compression Wrap Compression Amount: 30-40 mmHg (left) Discharge Instructions: Apply Circaid Juxta Lite Compression Wrap as directed 1. I would recommend currently that we continue with the [facility] which I think is doing quite well. 2. Also can recommend that we continue with the T island dressing or  similar to cover. elfa 3. Were also can continue to use the juxta lite compression wrap we showed her how to use that today she will continue to use it at home. We will see patient back for reevaluation [name] 1 week here [name] the clinic. If anything worsens or changes patient will contact our office for additional recommendations. Electronic Signature(s) Signed: 08/30/2022 1:37:28 PM By: Wanda Keeler PA-C Entered By: [name] on 08/30/2022 13:37:28 -------------------------------------------------------------------------------- SuperBill Details Patient Name: Date of Service: [name], Wanda Dreama Saa. 08/30/2022 Medical Record Number: [id number] Patient Account Number: [id number] Date of Birth/Sex: Treating RN: 01-15-1941 (82 y.o. Charolette Forward, [name] Primary Care Provider: Tracie [name] Other Clinician: Massie [name] Referring Provider: Treating Provider/Extender: [name]: 7 Diagnosis Coding ICD-10 Codes Code Description E11.622 Type 2 diabetes mellitus with other skin ulcer I89.0 Lymphedema, not elsewhere classified L97.822 Non-pressure chronic ulcer of other part of left lower leg with fat layer exposed I10 Essential (primary) hypertension I50.42 Chronic combined systolic (congestive) and diastolic (congestive) heart failure N18.30 Chronic kidney disease, stage 3 unspecified Facility Procedures : CPT4 Code: [id number] Description: [id number] - DEB SUBQ TISSUE 20 SQ CM/< ICD-10 Diagnosis  Description L97.822 Non-pressure chronic ulcer of other part of left lower leg with fat layer expos Modifier: ed Quantity: 1 Physician Procedures : CPT4 Code Description Modifier [id number] - WC PHYS SUBQ TISS 20 SQ CM ICD-10 Diagnosis Description [name], [name] ([id number]) [id number] [name] [id number] Non-pressure chronic ulcer of other part of left lower leg with fat  layer exposed Quantity: 1 age 62 of 7 Electronic Signature(s) Signed: 08/30/2022 1:37:50 PM By: Wanda Keeler PA-C Entered By: [name] on 08/30/2022 13:37:50

## 2022-09-06 ENCOUNTER — Ambulatory Visit: Payer: Medicare Other

## 2022-09-06 NOTE — Progress Notes (Signed)
JERRI, DEVILBISS ([id number]) [id number] Page 1 of 9 Visit Report for 08/30/2022 Arrival Information Details Patient Name: Date of Service: [place], [name] 08/30/2022 12:00 PM Medical Record Number: [id number] Patient Account Number: [id number] Date of Birth/Sex: Treating RN: 12/04/1940 (82 y.o. [place]e [place] Primary Care Husayn Reim: Tracie Harrier Other Clinician: Massie Kluver Referring Quintez Maselli: Treating Jhamari Markowicz/Extender: Solon Palm Weeks in Treatment: 7 Visit Information History Since Last Visit All ordered tests and consults were completed: No Patient Arrived: Kasandra Knudsen Added or deleted any medications: No Arrival Time: 12:05 Any new allergies or adverse reactions: No Transfer Assistance: None Had a fall or experienced change in No Patient Identification Verified: Yes activities of daily living that may affect Secondary Verification Process Completed: Yes risk of falls: Patient Requires Transmission-Based Precautions: No Signs or symptoms of abuse/neglect since last visito No Patient Has Alerts: Yes Hospitalized since last visit: No Patient Alerts: 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 08/30/2022 12:00 PM Medical Record Number: [id number] Patient Account Number: [id number] Date of Birth/Sex: Treating RN: 1941-02-02 (82 y.o. [place]e [place] Primary Care Aniyha Tate: Tracie Harrier Other Clinician: Massie Kluver Referring Kayle Correa: Treating  Lyle Niblett/Extender: Solon Palm Weeks in Treatment: 7 Clinic Level of Care Assessment Items TOOL 1 Quantity Score '[]'$  - 0 Use when EandM and Procedure is performed on INITIAL visit ASSESSMENTS - Nursing Assessment / Reassessment '[]'$  - 0 General Physical Exam (combine w/ comprehensive assessment (listed just below) when performed on new pt. evals) '[]'$  - 0 Comprehensive Assessment (HX, ROS, Risk Assessments, Wounds Hx, etc.) Balz, Britton P ([id number]) [phone].pdf Page 2 of 9 ASSESSMENTS - Wound and Skin Assessment / Reassessment '[]'$  - 0 Dermatologic / Skin Assessment (not related to wound area) ASSESSMENTS - Ostomy and/or Continence Assessment and Care '[]'$  - 0 Incontinence Assessment and Management '[]'$  - 0 Ostomy Care Assessment and Management (repouching, etc.) PROCESS - Coordination of Care '[]'$  - 0 Simple Patient / Family Education for ongoing care '[]'$  - 0 Complex (extensive) Patient / Family Education for ongoing care '[]'$  - 0 Staff obtains Programmer, systems, Records, T Results / Process Orders est '[]'$  - 0 Staff telephones HHA, Nursing Homes / Clarify orders / etc '[]'$  - 0 Routine Transfer to another Facility (non-emergent condition) '[]'$  - 0 Routine Hospital Admission (non-emergent condition) '[]'$  - 0 New Admissions / Biomedical engineer / Ordering NPWT Apligraf, etc. , '[]'$  - 0 Emergency Hospital Admission (emergent condition) PROCESS - Special Needs '[]'$  - 0 Pediatric / Minor Patient Management '[]'$  - 0 Isolation Patient Management '[]'$  - 0 Hearing / Language / Visual special needs '[]'$  - 0 Assessment of Community assistance (transportation, D/C planning, etc.) '[]'$  - 0 Additional assistance / Altered mentation '[]'$  - 0 Support Surface(s) Assessment (bed, cushion, seat, etc.) INTERVENTIONS - Miscellaneous '[]'$  - 0 External ear exam '[]'$  - 0 Patient Transfer (multiple staff / Civil Service fast streamer / Similar devices) '[]'$  - 0 Simple Staple / Suture removal (25 or  less) '[]'$  - 0 Complex Staple / Suture removal (26 or more) '[]'$  - 0 Hypo/Hyperglycemic Management (do not check if billed separately) '[]'$  - 0 Ankle /  Brachial Index (ABI) - do not check if billed separately Has the patient been seen at the hospital within the last three years: Yes Total Score: 0 Level Of Care: ____ Electronic Signature(s) Signed: 08/30/2022 1:35:31 PM By: Massie Kluver Entered By: Massie Kluver on 08/30/2022 12:37:00 -------------------------------------------------------------------------------- Encounter Discharge Information Details Patient Name: Date of Service: [name], [name] 08/30/2022 12:00 PM Medical Record Number: [id number] Patient Account Number: [id number] Date of Birth/Sex: Treating RN: Sep 22, 1940 (82 y.o. [place]e [place] Primary Care Arvie Bartholomew: Tracie Harrier Other Clinician: Massie Kluver Referring Jalicia Roszak: Treating Juleah Paradise/Extender: Solon Palm [place], Derrill Memo ([id number]) [id number] Page 3 of 9 Weeks in Treatment: 7 Encounter Discharge Information Items Post Procedure Vitals Discharge Condition: Stable Temperature (F): 98.1 Ambulatory Status: Cane Pulse (bpm): 54 Discharge Destination: Home Respiratory Rate (breaths/min): 18 Transportation: Private Auto Blood Pressure (mmHg): 160/81 Accompanied By: self Schedule Follow-up Appointment: Yes Clinical Summary of Care: Electronic Signature(s) Signed: 08/30/2022 1:35:31 PM By: Massie Kluver Entered By: Massie Kluver on 08/30/2022 12:55:17 -------------------------------------------------------------------------------- Lower Extremity Assessment Details Patient Name: Date of Service: [name], [name] 08/30/2022 12:00 PM Medical Record Number: [id number] Patient Account Number: [id number] Date of Birth/Sex: Treating RN: 1941/06/14 (82 y.o. [place]e [place] Primary Care Abou Sterkel: Tracie Harrier Other Clinician: Massie Kluver Referring  Shawntel Farnworth: Treating Emina Ribaudo/Extender: Solon Palm Weeks in Treatment: 7 Edema Assessment Assessed: [Left: Yes] [Right: No] Edema: [Left: Ye] [Right: s] Calf Left: Right: Point of Measurement: 34 cm From Medial Instep 42.3 cm Ankle Left: Right: Point of Measurement: 12 cm From Medial Instep 26 cm Vascular Assessment Pulses: Dorsalis Pedis Palpable: [Left:Yes] Electronic Signature(s) Signed: 08/30/2022 1:35:31 PM By: Massie Kluver Signed: 09/04/2022 9:49:41 PM By: Gretta Cool, BSN, RN, CWS, Kim RN, BSN Entered By: Massie Kluver on 08/30/2022 12:16:43 [place], Derrill Memo ([id number]) [id number] Page 4 of 9 -------------------------------------------------------------------------------- Multi Wound Chart Details Patient Name: Date of Service: [place], [name] 08/30/2022 12:00 PM Medical Record Number: [id number] Patient Account Number: [id number] Date of Birth/Sex: Treating RN: 1941/04/16 (82 y.o. [place] Primary Care Myna Freimark: Tracie Harrier Other Clinician: Massie Kluver Referring Joncarlos Atkison: Treating Lakresha Stifter/Extender: Solon Palm Weeks in Treatment: 7 Vital Signs Height(in): 69 Pulse(bpm): 54 Weight(lbs): 250 Blood Pressure(mmHg): 160/81 Body Mass Index(BMI): 36.9 Temperature(F): 98.1 Respiratory Rate(breaths/min): 18 [2:Photos:] [N/A:N/A] Left, Lateral Lower Leg N/A N/A Wound Location: Gradually Appeared N/A N/A Wounding Event: Diabetic Wound/Ulcer of the Lower N/A N/A Primary Etiology: Extremity Lymphedema, Congestive Heart N/A N/A Comorbid History: Failure, Hypertension, Type II Diabetes 05/01/2022 N/A N/A Date Acquired: 7 N/A N/A Weeks of Treatment: Open N/A N/A Wound Status: No N/A N/A Wound Recurrence: 1.7x0.9x0.1 N/A N/A Measurements L x W x D (cm) 1.202 N/A N/A A (cm) : rea 0.12 N/A N/A Volume (cm) : 57.50% N/A N/A % Reduction in A rea: 78.80% N/A N/A % Reduction in  Volume: Grade 2 N/A N/A Classification: Medium N/A N/A Exudate A mount: Serosanguineous N/A N/A Exudate Type: red, brown N/A N/A Exudate Color: None Present (0%) N/A N/A Granulation A mount: Large (67-100%) N/A N/A Necrotic A mount: Fascia: No N/A N/A Exposed Structures: Fat Layer (Subcutaneous Tissue): No Tendon: No Muscle: No Joint: No Bone: No None N/A N/A Epithelialization: Treatment Notes Electronic Signature(s) Signed: 08/30/2022 1:35:31 PM By: Massie Kluver Entered By: Massie Kluver on 08/30/2022 12:18:49 [place], Derrill Memo ([id number]) [id number] Page 5 of 9 -------------------------------------------------------------------------------- Multi-Disciplinary Care Plan Details Patient Name: Date of Service: [place], [name] 08/30/2022 12:00 PM Medical Record Number: [id number]  Patient Account Number: [id number] Date of Birth/Sex: Treating RN: March 22, 1941 (82 y.o. [place] Primary Care Kasidi Shanker: Tracie Harrier Other Clinician: Massie Kluver Referring Genevieve Arbaugh: Treating Sudie Bandel/Extender: Solon Palm Weeks in Treatment: 7 Active Inactive Necrotic Tissue Nursing Diagnoses: Knowledge deficit related to management of necrotic/devitalized tissue Goals: Patient/caregiver will verbalize understanding of reason and process for debridement of necrotic tissue Date Initiated: 07/12/2022 Target Resolution Date: 08/12/2022 Goal Status: Active Interventions: Assess patient pain level pre-, during and post procedure and prior to discharge Provide education on necrotic tissue and debridement process Notes: Wound/Skin Impairment Nursing Diagnoses: Knowledge deficit related to ulceration/compromised skin integrity Goals: Patient/caregiver will verbalize understanding of skin care regimen Date Initiated: 07/12/2022 Target Resolution Date: 08/12/2022 Goal Status: Active Ulcer/skin breakdown will have a volume reduction of 30%  by week 4 Date Initiated: 07/12/2022 Target Resolution Date: 08/12/2022 Goal Status: Active Ulcer/skin breakdown will have a volume reduction of 50% by week 8 Date Initiated: 07/12/2022 Target Resolution Date: 09/10/2022 Goal Status: Active Ulcer/skin breakdown will have a volume reduction of 80% by week 12 Date Initiated: 07/12/2022 Target Resolution Date: 10/11/2022 Goal Status: Active Ulcer/skin breakdown will heal within 14 weeks Date Initiated: 07/12/2022 Target Resolution Date: 11/10/2022 Goal Status: Active Interventions: Assess patient/caregiver ability to obtain necessary supplies Assess patient/caregiver ability to perform ulcer/skin care regimen upon admission and as needed Assess ulceration(s) every visit Notes: Electronic Signature(s) Signed: 08/30/2022 1:35:31 PM By: Massie Kluver Signed: 09/04/2022 9:49:41 PM By: Gretta Cool, BSN, RN, CWS, Kim RN, BSN Entered By: Massie Kluver on 08/30/2022 12:37:09 [place], Derrill Memo ([id number]) [id number] Page 6 of 9 -------------------------------------------------------------------------------- Pain Assessment Details Patient Name: Date of Service: [place], [name] 08/30/2022 12:00 PM Medical Record Number: [id number] Patient Account Number: [id number] Date of Birth/Sex: Treating RN: 1941-01-06 (82 y.o. [place] Primary Care Haroldine Redler: Tracie Harrier Other Clinician: Massie Kluver Referring Milanya Sunderland: Treating Eleri Ruben/Extender: Solon Palm Weeks in Treatment: 7 Active Problems Location of Pain Severity and Description of Pain Patient Has Paino Yes Site Locations Pain Location: Pain in Ulcers Duration of the Pain. Constant / Intermittento Intermittent Rate the pain. Current Pain Level: 4 Character of Pain Describe the Pain: Burning Pain Management and Medication Current Pain Management: Medication: Yes Cold Application: No Rest: Yes Massage: No Activity: No T.E.N.S.:  No Heat Application: No Leg drop or elevation: No Is the Current Pain Management Adequate: Inadequate How does your wound impact your activities of daily livingo Sleep: No Bathing: No Appetite: No Relationship With Others: No Bladder Continence: No Emotions: No Bowel Continence: No Work: No Toileting: No Drive: No Dressing: No Hobbies: No Electronic Signature(s) Signed: 08/30/2022 1:35:31 PM By: Massie Kluver Signed: 09/04/2022 9:49:41 PM By: Gretta Cool, BSN, RN, CWS, Kim RN, BSN Entered By: Massie Kluver on 08/30/2022 12:09:44 [place], Derrill Memo ([id number]) [id number] Page 7 of 9 -------------------------------------------------------------------------------- Patient/Caregiver Education Details Patient Name: Date of Service: [place] 3/1/2024andnbsp12:00 PM Medical Record Number: [id number] Patient Account Number: [id number] Date of Birth/Gender: Treating RN: 04-01-1941 (82 y.o. [place]e [place] Primary Care Physician: Tracie Harrier Other Clinician: Massie Kluver Referring Physician: Treating Physician/Extender: Solon Palm Weeks in Treatment: 7 Education Assessment Education Provided To: Patient Education Topics Provided Wound/Skin Impairment: Handouts: Other: continue wound care as directed Methods: Explain/Verbal Responses: State content correctly Electronic Signature(s) Signed: 08/30/2022 1:35:31 PM By: Massie Kluver Entered By: Massie Kluver on 08/30/2022 12:37:26 -------------------------------------------------------------------------------- Wound Assessment Details Patient Name: Date of Service: [name], [name] 08/30/2022 12:00 PM Medical Record  Number: [id number] Patient Account Number: [id number] Date of Birth/Sex: Treating RN: 1941/01/13 (82 y.o. [place] Primary Care Torria Fromer: Tracie Harrier Other Clinician: Massie Kluver Referring Khylen Riolo: Treating Terrin Meddaugh/Extender: Solon Palm Weeks in Treatment: 7 Wound Status Wound Number: 2 Primary Diabetic Wound/Ulcer of the Lower Extremity Etiology: Wound Location: Left, Lateral Lower Leg Wound Status: Open Wounding Event: Gradually Appeared Comorbid Lymphedema, Congestive Heart Failure, Hypertension, Type Date Acquired: 05/01/2022 History: II Diabetes Weeks Of Treatment: 7 Clustered Wound: No Photos [place], Karlee P ([id number]) [id number] Page 8 of 9 Wound Measurements Length: (cm) 1.7 Width: (cm) 0.9 Depth: (cm) 0.1 Area: (cm) 1.202 Volume: (cm) 0.12 % Reduction in Area: 57.5% % Reduction in Volume: 78.8% Epithelialization: None Wound Description Classification: Grade 2 Exudate Amount: Medium Exudate Type: Serosanguineous Exudate Color: red, brown Foul Odor After Cleansing: No Slough/Fibrino Yes Wound Bed Granulation Amount: None Present (0%) Exposed Structure Necrotic Amount: Large (67-100%) Fascia Exposed: No Necrotic Quality: Adherent Slough Fat Layer (Subcutaneous Tissue) Exposed: No Tendon Exposed: No Muscle Exposed: No Joint Exposed: No Bone Exposed: No Treatment Notes Wound #2 (Lower Leg) Wound Laterality: Left, Lateral Cleanser Soap and Water Discharge Instruction: Gently cleanse wound with antibacterial soap, rinse and pat dry prior to dressing wounds Peri-Wound Care Topical Primary Dressing Hydrofera Blue Ready Transfer Foam, 2.5x2.5 (in/in) Discharge Instruction: Apply Hydrofera Blue Ready to wound bed as directed Secondary Dressing T Adhesive [employer], 4x4 (in/in) elfa Discharge Instruction: Apply over dressing to secure in place. Secured With Compression Wrap Compression Stockings Circaid Juxta Lite Compression Wrap Quantity: 1 Left Leg Compression Amount: 30-40 mmHg Discharge Instruction: Apply Circaid Juxta Lite Compression Wrap as directed Add-Ons Electronic Signature(s) Signed: 08/30/2022 1:35:31 PM By: Massie Kluver Signed: 09/04/2022 9:49:41 PM By: Gretta Cool, BSN, RN, CWS, Kim RN, BSN Entered By: Massie Kluver on 08/30/2022 12:14:19 [place], Derrill Memo ([id number]) [id number] Page 9 of 9 -------------------------------------------------------------------------------- Vitals Details Patient Name: Date of Service: [place], [name] 08/30/2022 12:00 PM Medical Record Number: [id number] Patient Account Number: [id number] Date of Birth/Sex: Treating RN: 1941-05-24 (82 y.o. Charolette Forward, Kim Primary Care Jeymi Hepp: Tracie Harrier Other Clinician: Massie Kluver Referring Christabel Camire: Treating Shantell Belongia/Extender: Solon Palm Weeks in Treatment: 7 Vital Signs Time Taken: 12:07 Temperature (F): 98.1 Height (in): 69 Pulse (bpm): 54 Weight (lbs): 250 Respiratory Rate (breaths/min): 18 Body Mass Index (BMI): 36.9 Blood Pressure (mmHg): 160/81 Reference Range: 80 - 120 mg / dl Electronic Signature(s) Signed: 08/30/2022 1:35:31 PM By: Massie Kluver Entered By: Massie Kluver on 08/30/2022 12:09:40

## 2022-09-09 ENCOUNTER — Encounter (HOSPITAL_BASED_OUTPATIENT_CLINIC_OR_DEPARTMENT_OTHER): Payer: Medicare Other | Admitting: Internal Medicine

## 2022-09-09 ENCOUNTER — Ambulatory Visit
Admission: RE | Admit: 2022-09-09 | Discharge: 2022-09-09 | Disposition: A | Discharge status: Home / Self Care | Payer: Medicare Other | Source: Ambulatory Visit | Attending: Internal Medicine | Admitting: Internal Medicine

## 2022-09-09 DIAGNOSIS — L97822 Non-pressure chronic ulcer of other part of left lower leg with fat layer exposed: Secondary | ICD-10-CM | POA: Diagnosis not present

## 2022-09-09 DIAGNOSIS — Z1231 Encounter for screening mammogram for malignant neoplasm of breast: Secondary | ICD-10-CM | POA: Diagnosis not present

## 2022-09-09 DIAGNOSIS — E11622 Type 2 diabetes mellitus with other skin ulcer: Secondary | ICD-10-CM | POA: Diagnosis not present

## 2022-09-09 NOTE — Progress Notes (Signed)
RAKISHA, HEE ([id number]) [id number] Page 1 of 8 Visit Report for 09/09/2022 Arrival Information Details Patient Name: Date of Service: [place], [name] 09/09/2022 9:15 Wanda M Medical Record Number: [id number] Patient Account Number: [id number] Date of Birth/Sex: Treating RN: May 05, 1941 (82 y.o. [name] Primary Care Shmuel Girgis: Tracie Harrier Other Clinician: Referring Jearlene Bridwell: Treating Jevaughn Degollado/Extender: Arn Medal Weeks [name]: 8 Visit Information History Since Last Visit Added or deleted any medications: No Patient Arrived: [name] Any new allergies or adverse reactions: No Arrival Time: 09:19 Had Wanda fall or experienced change [name] No Accompanied By: self activities of daily living that may affect Transfer Assistance: None risk of falls: Patient Identification Verified: Yes Hospitalized since last visit: No Secondary Verification Process Completed: Yes Has Dressing [name] Place as Prescribed: Yes Patient Requires Transmission-Based Precautions: No Has Compression [name] Place as Prescribed: No Patient Has Alerts: Yes Pain Present Now: Yes Patient Alerts: 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 08/14/21 R 1.04 TBI .96 08/14/21 Electronic Signature(s) Signed: 09/09/2022 3:25:33 PM By: Levora Dredge Entered By: Levora Dredge on 09/09/2022 09:20:38 -------------------------------------------------------------------------------- Clinic Level of Care Assessment Details Patient Name: Date of Service: [place], [name] 09/09/2022 9:15 Wanda M Medical Record Number: [id number] Patient Account Number: [id number] Date of Birth/Sex: Treating RN: January 08, 1941 (82 y.o. [name] Primary Care Jisela Merlino: Tracie Harrier Other Clinician: Referring Tayte Mcwherter: Treating Mico Spark/Extender: Arn Medal Weeks [name]: 8 Clinic Level of Care Assessment Items TOOL 1 Quantity Score '[]'$  - 0 Use when EandM and Procedure is  performed on INITIAL visit ASSESSMENTS - Nursing Assessment / Reassessment '[]'$  - 0 General Physical Exam (combine w/ comprehensive assessment (listed just below) when performed on new pt. evals) '[]'$  - 0 Comprehensive Assessment (HX, ROS, Risk Assessments, Wounds Hx, etc.) ASSESSMENTS - Wound and Skin Assessment / Reassessment '[]'$  - 0 Dermatologic / Skin Assessment (not related to wound area) [place], Alyannah [name] ([id number]) [id number] Page 2 of 8 ASSESSMENTS - Ostomy and/or Continence Assessment and Care '[]'$  - 0 Incontinence Assessment and Management '[]'$  - 0 Ostomy Care Assessment and Management (repouching, etc.) PROCESS - Coordination of Care '[]'$  - 0 Simple Patient / Family Education for ongoing care '[]'$  - 0 Complex (extensive) Patient / Family Education for ongoing care '[]'$  - 0 Staff obtains Programmer, systems, Records, T Results / Process Orders est '[]'$  - 0 Staff telephones HHA, Nursing Homes / Clarify orders / etc '[]'$  - 0 Routine Transfer to another Facility (non-emergent condition) '[]'$  - 0 Routine Hospital Admission (non-emergent condition) '[]'$  - 0 New Admissions / Biomedical engineer / Ordering NPWT Apligraf, etc. , '[]'$  - 0 Emergency Hospital Admission (emergent condition) PROCESS - Special Needs '[]'$  - 0 Pediatric / Minor Patient Management '[]'$  - 0 Isolation Patient Management '[]'$  - 0 Hearing / Language / Visual special needs '[]'$  - 0 Assessment of Community assistance (transportation, D/C planning, etc.) '[]'$  - 0 Additional assistance / Altered mentation '[]'$  - 0 Support Surface(s) Assessment (bed, cushion, seat, etc.) INTERVENTIONS - Miscellaneous '[]'$  - 0 External ear exam '[]'$  - 0 Patient Transfer (multiple staff / Civil Service fast streamer / Similar devices) '[]'$  - 0 Simple Staple / Suture removal (25 or less) '[]'$  - 0 Complex Staple / Suture removal (26 or more) '[]'$  - 0 Hypo/Hyperglycemic Management (do not check if billed separately) '[]'$  - 0 Ankle / Brachial Index (ABI) -  do not check if billed separately Has the patient been seen at the hospital within the last three years: Yes Total Score: 0 Level  Of Care: ____ Electronic Signature(s) Signed: 09/09/2022 3:25:33 PM By: Levora Dredge Entered By: Levora Dredge on 09/09/2022 09:44:18 -------------------------------------------------------------------------------- Encounter Discharge Information Details Patient Name: Date of Service: [address], [name] 09/09/2022 9:15 Wanda M Medical Record Number: [id number] Patient Account Number: [id number] Date of Birth/Sex: Treating RN: 04-Aug-1940 (82 y.o. [name] Primary Care Akiah Bauch: Tracie Harrier Other Clinician: Referring Ludivina Guymon: Treating Nathaniel Wakeley/Extender: Arn Medal Weeks [name]: 8 Encounter Discharge Information Items Post Procedure [name], [name] ([id number]) [id number] Page 3 of 8 Discharge Condition: Stable Temperature (F): 97.7 Ambulatory Status: Cane Pulse (bpm): 62 Discharge Destination: Home Respiratory Rate (breaths/min): 18 Transportation: Private Auto Blood Pressure (mmHg): 128/72 Accompanied By: self Schedule Follow-up Appointment: Yes Clinical Summary of Care: Electronic Signature(s) Signed: 09/09/2022 3:25:33 PM By: Levora Dredge Entered By: Levora Dredge on 09/09/2022 09:45:40 -------------------------------------------------------------------------------- Lower Extremity Assessment Details Patient Name: Date of Service: [place], [name] 09/09/2022 9:15 Wanda M Medical Record Number: [id number] Patient Account Number: [id number] Date of Birth/Sex: Treating RN: 1941-04-09 (82 y.o. [name] Primary Care Amore Grater: Tracie Harrier Other Clinician: Referring Maliea Grandmaison: Treating Soila Printup/Extender: Arn Medal Weeks [name]: 8 Edema Assessment Assessed: [Left: No] [Right: No] Edema: [Left: Ye] [Right: s] Calf Left:  Right: Point of Measurement: 35 cm From Medial Instep 41.7 cm Ankle Left: Right: Point of Measurement: 10 cm From Medial Instep 25.5 cm Vascular Assessment Pulses: Dorsalis Pedis Palpable: [Left:Yes] Posterior Tibial Palpable: [Left:Yes] Electronic Signature(s) Signed: 09/09/2022 3:25:33 PM By: Levora Dredge Entered By: Levora Dredge on 09/09/2022 09:29:43 [name], [name] ([id number]) [id number] Page 4 of 8 -------------------------------------------------------------------------------- Multi Wound Chart Details Patient Name: Date of Service: [place], [name] 09/09/2022 9:15 Wanda M Medical Record Number: [id number] Patient Account Number: [id number] Date of Birth/Sex: Treating RN: 03/20/1941 (82 y.o. [name] Primary Care [place]: Tracie Harrier Other Clinician: Referring Arlo Butt: Treating Lyndall Windt/Extender: Arn Medal Weeks [name]: 8 Vital Signs Height([name]): 69 Pulse(bpm): 62 Weight(lbs): 250 Blood Pressure(mmHg): 128/72 Body Mass Index(BMI): 36.9 Temperature(F): 97.7 Respiratory Rate(breaths/min): 18 [2:Photos:] [N/Wanda:N/Wanda] Left, Lateral Lower Leg N/Wanda N/Wanda Wound Location: Gradually Appeared N/Wanda N/Wanda Wounding Event: Diabetic Wound/Ulcer of the Lower N/Wanda N/Wanda Primary Etiology: Extremity Lymphedema, Congestive Heart N/Wanda N/Wanda Comorbid History: Failure, Hypertension, Type II Diabetes 05/01/2022 N/Wanda N/Wanda Date Acquired: 8 N/Wanda N/Wanda Weeks of Treatment: Open N/Wanda N/Wanda Wound Status: No N/Wanda N/Wanda Wound Recurrence: 1x0.8x0.2 N/Wanda N/Wanda Measurements L x W x D (cm) 0.628 N/Wanda N/Wanda Wanda (cm) : rea 0.126 N/Wanda N/Wanda Volume (cm) : 77.80% N/Wanda N/Wanda % Reduction [name] Wanda rea: 77.70% N/Wanda N/Wanda % Reduction [name] Volume: Grade 2 N/Wanda N/Wanda Classification: Medium N/Wanda N/Wanda Exudate Wanda mount: Serosanguineous N/Wanda N/Wanda Exudate Type: red, brown N/Wanda N/Wanda Exudate Color: Medium (34-66%) N/Wanda N/Wanda Granulation Wanda mount: Medium (34-66%) N/Wanda  N/Wanda Necrotic Wanda mount: Fat Layer (Subcutaneous Tissue): Yes N/Wanda N/Wanda Exposed Structures: Fascia: No Tendon: No Muscle: No Joint: No Bone: No Small (1-33%) N/Wanda N/Wanda Epithelialization: Treatment Notes Electronic Signature(s) Signed: 09/09/2022 3:25:33 PM By: Levora Dredge Entered By: Levora Dredge on 09/09/2022 09:29:51 [name], [name] ([id number]) [id number] Page 5 of 8 -------------------------------------------------------------------------------- Multi-Disciplinary Care Plan Details Patient Name: Date of Service: [place], [name] 09/09/2022 9:15 Wanda M Medical Record Number: [id number] Patient Account Number: [id number] Date of Birth/Sex: Treating RN: 03-Feb-1941 (82 y.o. [name] Primary Care Nichollas Perusse: Tracie Harrier Other Clinician: Referring Ceairra Mccarver: Treating Tkeyah Burkman/Extender: Arn Medal Weeks [name]: 8 Active Inactive Wound/Skin Impairment  Nursing Diagnoses: Knowledge deficit related to ulceration/compromised skin integrity Goals: Patient/caregiver will verbalize understanding of skin care regimen Date Initiated: 07/12/2022 Target Resolution Date: 08/12/2022 Goal Status: Active Ulcer/skin breakdown will have Wanda volume reduction of 30% by week 4 Date Initiated: 07/12/2022 Target Resolution Date: 08/12/2022 Goal Status: Active Ulcer/skin breakdown will have Wanda volume reduction of 50% by week 8 Date Initiated: 07/12/2022 Target Resolution Date: 09/10/2022 Goal Status: Active Ulcer/skin breakdown will have Wanda volume reduction of 80% by week 12 Date Initiated: 07/12/2022 Target Resolution Date: 10/11/2022 Goal Status: Active Ulcer/skin breakdown will heal within 14 weeks Date Initiated: 07/12/2022 Target Resolution Date: 11/10/2022 Goal Status: Active Interventions: Assess patient/caregiver ability to obtain necessary supplies Assess patient/caregiver ability to perform ulcer/skin care regimen upon  admission and as needed Assess ulceration(s) every visit Notes: Electronic Signature(s) Signed: 09/09/2022 3:25:33 PM By: Levora Dredge Entered By: Levora Dredge on 09/09/2022 09:44:41 -------------------------------------------------------------------------------- Pain Assessment Details Patient Name: Date of Service: [name], [name] 09/09/2022 9:15 Wanda M Medical Record Number: [id number] Patient Account Number: [id number] [name], [name] ([id number]) [id number] Page 6 of 8 Date of Birth/Sex: Treating RN: 1940/09/12 (82 y.o. [name] Primary Care Casmere Hollenbeck: Other Clinician: Tracie Harrier Referring Maleki Hippe: Treating Johari Pinney/Extender: Arn Medal Weeks [name]: 8 Active Problems Location of Pain Severity and Description of Pain Patient Has Paino Yes Site Locations Rate the pain. Current Pain Level: 6 Pain Management and Medication Current Pain Management: Notes pt states pain at wound site Electronic Signature(s) Signed: 09/09/2022 3:25:33 PM By: Levora Dredge Entered By: Levora Dredge on 09/09/2022 09:22:49 -------------------------------------------------------------------------------- Patient/Caregiver Education Details Patient Name: Date of Service: [name], [name] 3/11/2024andnbsp9:15 Wanda M Medical Record Number: [id number] Patient Account Number: [id number] Date of Birth/Gender: Treating RN: 01/01/1941 (82 y.o. [name] Primary Care Physician: Tracie Harrier Other Clinician: Referring Physician: Treating Physician/Extender: Arn Medal Weeks [name]: 8 Education Assessment Education Provided To: Patient Education Topics Provided Wound Debridement: Handouts: Wound Debridement Methods: Explain/Verbal Responses: State content correctly [name], [name] ([id number]) [id number] Page 7 of 8 Wound/Skin Impairment: Handouts:  Caring for Your Ulcer Methods: Explain/Verbal Responses: State content correctly Electronic Signature(s) Signed: 09/09/2022 3:25:33 PM By: Levora Dredge Entered By: Levora Dredge on 09/09/2022 09:44:59 -------------------------------------------------------------------------------- Wound Assessment Details Patient Name: Date of Service: [name], [name] 09/09/2022 9:15 Wanda M Medical Record Number: [id number] Patient Account Number: [id number] Date of Birth/Sex: Treating RN: 06-Sep-1940 (82 y.o. [name] Primary Care Laelynn Blizzard: Tracie Harrier Other Clinician: Referring Merrick Maggio: Treating Gergory Biello/Extender: Arn Medal Weeks [name]: 8 Wound Status Wound Number: 2 Primary Diabetic Wound/Ulcer of the Lower Extremity Etiology: Wound Location: Left, Lateral Lower Leg Wound Status: Open Wounding Event: Gradually Appeared Comorbid Lymphedema, Congestive Heart Failure, Hypertension, Type Date Acquired: 05/01/2022 History: II Diabetes Weeks Of Treatment: 8 Clustered Wound: No Photos Wound Measurements Length: (cm) 1 Width: (cm) 0.8 Depth: (cm) 0.2 Area: (cm) 0.628 Volume: (cm) 0.126 % Reduction [name] Area: 77.8% % Reduction [name] Volume: 77.7% Epithelialization: Small (1-33%) Tunneling: No Undermining: No Wound Description Classification: Grade 2 Exudate Amount: Medium Exudate Type: Serosanguineous Exudate Color: red, brown Foul Odor After Cleansing: No Slough/Fibrino Yes Wound Bed Granulation Amount: Medium (34-66%) Exposed Structure Necrotic Amount: Medium (34-66%) Fascia Exposed: No Necrotic Quality: Adherent Slough Fat Layer (Subcutaneous Tissue) Exposed: Yes Tendon Exposed: No [name], [name] ([id number]) [id number] Page 8 of 8 Muscle Exposed: No Joint Exposed: No Bone Exposed: No Treatment Notes Wound #2 (Lower Leg) Wound Laterality: Left,  Lateral Cleanser Soap and Water Discharge Instruction:  Gently cleanse wound with antibacterial soap, rinse and pat dry prior to dressing wounds Peri-Wound Care Topical Primary Dressing Hydrofera Blue Ready Transfer Foam, 2.5x2.5 ([name]/[name]) Discharge Instruction: Apply Hydrofera Blue Ready to wound bed as directed Secondary [place] Dressing, 4x4 ([name]/[name]) elfa Discharge Instruction: Apply over dressing to secure [name] place. Secured With Compression Wrap Compression Stockings Circaid Juxta Lite Compression Wrap Quantity: 1 Left Leg Compression Amount: 30-40 mmHg Discharge Instruction: Apply Circaid Juxta Lite Compression Wrap as directed Add-Ons Electronic Signature(s) Signed: 09/09/2022 3:25:33 PM By: Levora Dredge Entered By: Levora Dredge on 09/09/2022 09:29:18 -------------------------------------------------------------------------------- Vitals Details Patient Name: Date of Service: [name], Wanda UDREY [name]. 09/09/2022 9:15 Wanda M Medical Record Number: [id number] Patient Account Number: [id number] Date of Birth/Sex: Treating RN: 06-02-1941 (82 y.o. [name] Primary Care Daris Harkins: Tracie Harrier Other Clinician: Referring Armondo Cech: Treating Breklyn Fabrizio/Extender: Arn Medal Weeks [name]: 8 Vital Signs Time Taken: 09:20 Temperature (F): 97.7 Height ([name]): 69 Pulse (bpm): 62 Weight (lbs): 250 Respiratory Rate (breaths/min): 18 Body Mass Index (BMI): 36.9 Blood Pressure (mmHg): 128/72 Reference Range: 80 - 120 mg / dl Electronic Signature(s) Signed: 09/09/2022 3:25:33 PM By: Levora Dredge Entered By: Levora Dredge on 09/09/2022 09:22:31

## 2022-09-09 NOTE — Progress Notes (Signed)
[name], [name] ([id number]) [id number] Page 1 of 8 Visit Report for 09/09/2022 Chief Complaint Document Details Patient Name: Date of Service: [name], [name] 09/09/2022 9:15 Wanda M Medical Record Number: [id number] Patient Account Number: [id number] Date of Birth/Sex: Treating RN: Jun 13, 1941 (82 y.o. Valetta Close Primary Care Provider: Tracie Harrier Other Clinician: Referring Provider: Treating Provider/Extender: Arn Medal Weeks [name]: 8 Information Obtained from: Patient Chief Complaint Left LE Ulcers Electronic Signature(s) Signed: 09/09/2022 1:05:58 PM By: Kalman Shan DO Entered By: Kalman Shan on 09/09/2022 09:36:32 -------------------------------------------------------------------------------- Debridement Details Patient Name: Date of Service: [name], Wanda UDREY P. 09/09/2022 9:15 Wanda M Medical Record Number: [id number] Patient Account Number: [id number] Date of Birth/Sex: Treating RN: [birth year] (82 y.o. Valetta Close Primary Care Provider: Tracie Harrier Other Clinician: Referring Provider: Treating Provider/Extender: Arn Medal Weeks [name]: 8 Debridement Performed for Assessment: Wound #2 Left,Lateral Lower Leg Performed By: Physician Kalman Shan, MD Debridement Type: Debridement Severity of Tissue Pre Debridement: Fat layer exposed Level of Consciousness (Pre-procedure): Awake and Alert Pre-procedure Verification/Time Out Yes - 09:34 Taken: Start Time: 09:34 Pain Control: Lidocaine 4% T opical Solution T Area Debrided (L x W): otal 1 (cm) x 0.8 (cm) = 0.8 (cm) Tissue and other material debrided: Non-Viable, Slough, Slough Level: Non-Viable Tissue Debridement Description: Selective/Open Wound Instrument: Curette Bleeding: Minimum Hemostasis Achieved: Pressure Response to Treatment: Procedure was tolerated well Level of Consciousness (Post-  Awake and Alert procedure): PRISMA, BROM P ([id number]) [id number] Page 2 of 8 Post Debridement Measurements of Total Wound Length: (cm) 1 Width: (cm) 0.8 Depth: (cm) 0.2 Volume: (cm) 0.126 Character of Wound/Ulcer Post Debridement: Stable Severity of Tissue Post Debridement: Fat layer exposed Post Procedure Diagnosis Same as Pre-procedure Electronic Signature(s) Signed: 09/09/2022 1:05:58 PM By: Kalman Shan DO Signed: 09/09/2022 3:25:33 PM By: Levora Dredge Entered By: Levora Dredge on 09/09/2022 09:35:12 -------------------------------------------------------------------------------- HPI Details Patient Name: Date of Service: [name], Wanda UDREY P. 09/09/2022 9:15 Wanda M Medical Record Number: [id number] Patient Account Number: [id number] Date of Birth/Sex: Treating RN: 1940-10-05 (82 y.o. Valetta Close Primary Care Provider: Tracie Harrier Other Clinician: Referring Provider: Treating Provider/Extender: Arn Medal Weeks [name]: 8 History of Present Illness HPI Description: 07-12-2022 upon evaluation today patient presents for initial inspection concerning issues she has been having with wounds on her left lower extremity. She thinks she may have hit the lateral portion of her leg on the dishwasher door but the other she thinks may have been Wanda tape injury. It is more of Wanda blister type situation. With that being said she tells me that she is having some discomfort although is not terrible at this point. Fortunately I do not see any signs of infection locally or systemically at this time which is great news. No fevers, chills, nausea, vomiting, or diarrhea. Patient does have Wanda history of diabetes mellitus type 2, lymphedema for which she is not really able to wear compression socks that she tells me she cannot get them on. She also has hypertension, congestive heart failure, and chronic kidney disease stage  III. 07-26-2022 upon evaluation today patient appears to be doing well currently [name]. The compression wrapping does seem to be helping I am good I perform some debridement [name] but the other wound is actually looking quite well. I think we are headed [name] the right direction. Of note she did put her compression wrap down she was  stating that this was getting too tight. 08-02-2022 upon evaluation today patient appears to be doing well currently [name]. She is actually showing signs of improvement [name] regard to both and I am extremely pleased with where we stand today. I do not see any evidence of active infection locally nor systemically which is great news. No fevers, chills, nausea, vomiting, or diarrhea. 08-09-2022 upon evaluation today patient appears to be doing well currently [name]. She has been tolerating the dressing changes without complication and overall 1 wound is healed the other is doing much better. Fortunately I do not see any signs of active infection locally or systemically which is great news. 08-16-2022 upon evaluation today patient appears to be doing well currently [name] although this is still showing some signs of being slow to heal. She keeps pushing the compression wrap down she tells me it gets too tight at the top we try to make some adjustments but is just not working for her. With that being said I think that we are probably going to switch to Tubigrip since she is not able to keep this up on more concerned about it causing additional damage to her leg and obviously is not helping it heal either if that is what is happening. 08-23-2022 upon evaluation today patient appears to be doing well currently [name]. This is showing signs of some improvement but fortunately though it is doing better and unfortunately is still showing Wanda lot of swelling [name] regard to her leg we have  been having Wanda hard time getting something on what she is able to keep on she is having Wanda lot of discomfort Wanda lot of ways. Fortunately I do not see any signs of active infection locally nor systemically which is great news. No fevers, chills, nausea, vomiting, or diarrhea. 08-30-2022 upon evaluation today patient appears to be doing well with regard to her wound. This is showing signs of being much smaller is doing quite well and overall I am extremely pleased with where we stand I do not see any signs of infection locally nor systemically at this point. 3/11; patient presents for follow-up. She has been using Hydrofera Blue along with her juxta light compression daily. She has no issues or complaints today. Electronic Signature(s) JACKQUELIN, SHANOR ([id number]) [id number] Page 3 of 8 Signed: 09/09/2022 1:05:58 PM By: Kalman Shan DO Entered By: Kalman Shan on 09/09/2022 09:37:07 -------------------------------------------------------------------------------- Physical Exam Details Patient Name: Date of Service: [name], [name] 09/09/2022 9:15 Wanda M Medical Record Number: [id number] Patient Account Number: [id number] Date of Birth/Sex: Treating RN: 05/10/1941 (82 y.o. Valetta Close Primary Care Provider: Tracie Harrier Other Clinician: Referring Provider: Treating Provider/Extender: Arn Medal Weeks [name]: 8 Constitutional . Cardiovascular . Psychiatric . Notes T the left lateral distal aspect of the lower extremity there is an open wound with granulation tissue and nonviable tissue. No signs of surrounding soft tissue o infection. 2+ pitting edema to the knee. Electronic Signature(s) Signed: 09/09/2022 1:05:58 PM By: Kalman Shan DO Entered By: Kalman Shan on 09/09/2022 09:37:38 -------------------------------------------------------------------------------- Physician Orders Details Patient Name: Date  of Service: [name], Wanda UDREY P. 09/09/2022 9:15 Wanda M Medical Record Number: [id number] Patient Account Number: [id number] Date of Birth/Sex: Treating RN: April 30, 1941 (82 y.o. Valetta Close Primary Care Provider: Tracie Harrier Other Clinician: Referring Provider: Treating Provider/Extender: Arn Medal Weeks [name]: 8 Verbal /  Phone Orders: No Diagnosis Coding Follow-up Appointments Return Appointment [name] 1 week. Bathing/ [employer] wounds with antibacterial soap and water. May shower; gently cleanse wound with antibacterial soap, rinse and pat dry prior to dressing wounds [place], Maela P ([id number]) [id number] Page 4 of 8 Anesthetic (Use 'Patient Medications' Section for Anesthetic Order Entry) Lidocaine applied to wound bed Edema Control - Lymphedema / Segmental Compressive Device / Other Elevate, Exercise Daily and Wanda void Standing for Long Periods of Time. Elevate legs to the level of the heart and pump ankles as often as possible Elevate leg(s) parallel to the floor when sitting. Wound Treatment Wound #2 - Lower Leg Wound Laterality: Left, Lateral Cleanser: Soap and Water 3 x Per Week/15 Days Discharge Instructions: Gently cleanse wound with antibacterial soap, rinse and pat dry prior to dressing wounds Prim Dressing: Hydrofera Blue Ready Transfer Foam, 2.5x2.5 ([name]/[name]) 3 x Per Week/15 Days ary Discharge Instructions: Apply Hydrofera Blue Ready to wound bed as directed Secondary Dressing: T Adhesive [employer], 4x4 ([name]/[name]) (Generic) 3 x Per Week/15 Days elfa Discharge Instructions: Apply over dressing to secure [name] place. Compression Stockings: Circaid Juxta Lite Compression Wrap Left Leg Compression Amount: 30-40 mmHG Discharge Instructions: Apply Circaid Juxta Lite Compression Wrap as directed Electronic Signature(s) Signed: 09/09/2022 1:05:58 PM By: Kalman Shan DO Signed: 09/09/2022 3:25:33 PM  By: Levora Dredge Entered By: Levora Dredge on 09/09/2022 09:44:10 -------------------------------------------------------------------------------- Problem List Details Patient Name: Date of Service: [name], Wanda UDREY P. 09/09/2022 9:15 Wanda M Medical Record Number: [id number] Patient Account Number: [id number] Date of Birth/Sex: Treating RN: [birth year] (83 y.o. 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/06/2023 No Yes I10 Essential (primary) hypertension 07/12/2022 No Yes I50.42 Chronic combined systolic (congestive) and diastolic (congestive) heart failure 07/12/2022 No Yes Thum, Rosabelle P ([id number]) [id number] Page 5 of 8 N18.30 Chronic kidney disease, stage 3 unspecified 07/12/2022 No Yes Inactive Problems Resolved Problems Electronic Signature(s) Signed: 09/09/2022 1:05:58 PM By: Kalman Shan DO Entered By: Kalman Shan on 09/09/2022 09:36:29 -------------------------------------------------------------------------------- Progress Note Details Patient Name: Date of Service: [name], Wanda Dreama Saa. 09/09/2022 9:15 Wanda M Medical Record Number: [id number] Patient Account Number: [id number] Date of Birth/Sex: Treating RN: Jan 25, 1941 (82 y.o. Valetta Close Primary Care Provider: Tracie Harrier Other Clinician: Referring Provider: Treating Provider/Extender: Arn Medal Weeks [name]: 8 Subjective Chief Complaint Information obtained from Patient Left LE Ulcers History of Present  Illness (HPI) 07-12-2022 upon evaluation today patient presents for initial inspection concerning issues she has been having with wounds on her left lower extremity. She thinks she may have hit the lateral portion of her leg on the dishwasher door but the other she thinks may have been Wanda tape injury. It is more of Wanda blister type situation. With that being said she tells me that she is having some discomfort although is not terrible at this point. Fortunately I do not see any signs of infection locally or systemically at this time which is great news. No fevers, chills, nausea, vomiting, or diarrhea. Patient does have Wanda history of diabetes mellitus type 2, lymphedema for which she is not really able to wear compression socks  that she tells me she cannot get them on. She also has hypertension, congestive heart failure, and chronic kidney disease stage III. 07-26-2022 upon evaluation today patient appears to be doing well currently [name]. The compression wrapping does seem to be helping I am good I perform some debridement [name] but the other wound is actually looking quite well. I think we are headed [name] the right direction. Of note she did put her compression wrap down she was stating that this was getting too tight. 08-02-2022 upon evaluation today patient appears to be doing well currently [name]. She is actually showing signs of improvement [name] regard to both and I am extremely pleased with where we stand today. I do not see any evidence of active infection locally nor systemically which is great news. No fevers, chills, nausea, vomiting, or diarrhea. 08-09-2022 upon evaluation today patient appears to be doing well currently [name]. She has been tolerating the dressing changes without complication and overall 1 wound is healed the other is doing much better. Fortunately I do not see any signs of active infection locally or  systemically which is great news. 08-16-2022 upon evaluation today patient appears to be doing well currently [name] although this is still showing some signs of being slow to heal. She keeps pushing the compression wrap down she tells me it gets too tight at the top we try to make some adjustments but is just not working for her. With that being said I think that we are probably going to switch to Tubigrip since she is not able to keep this up on more concerned about it causing additional damage to her leg and obviously is not helping it heal either if that is what is happening. 08-23-2022 upon evaluation today patient appears to be doing well currently [name]. This is showing signs of some improvement but fortunately though it is doing better and unfortunately is still showing Wanda lot of swelling [name] regard to her leg we have been having Wanda hard time getting something on what she is able to keep on she is having Wanda lot of discomfort Wanda lot of ways. Fortunately I do not see any signs of active infection locally nor systemically which is great news. No fevers, chills, nausea, vomiting, or diarrhea. 08-30-2022 upon evaluation today patient appears to be doing well with regard to her wound. This is showing signs of being much smaller is doing quite well and overall I am extremely pleased with where we stand I do not see any signs of infection locally nor systemically at this point. 3/11; patient presents for follow-up. She has been using Hydrofera Blue along with her juxta light compression daily. She has no issues or complaints today. [name], [name] ([id number]) [id number] Page 6 of 8 Objective Constitutional Vitals Time Taken: 9:20 AM, Height: 69 [name], Weight: 250 lbs, BMI: 36.9, Temperature: 97.7 F, Pulse: 62 bpm, Respiratory Rate: 18 breaths/min, Blood Pressure: 128/72 mmHg. General Notes: T the left lateral distal aspect of the lower extremity  there is an open wound with granulation tissue and nonviable tissue. No signs of o surrounding soft tissue infection. 2+ pitting edema to the knee. Integumentary (Hair, Skin) Wound #2 status is Open. Original cause of wound was Gradually Appeared. The date acquired was: 05/01/2022. The wound has been [name] 8 weeks. The wound is located on the  Left,Lateral Lower Leg. The wound measures 1cm length x 0.8cm width x 0.2cm depth; 0.628cm^2 area and 0.126cm^3 volume. There is Fat Layer (Subcutaneous Tissue) exposed. There is no tunneling or undermining noted. There is Wanda medium amount of serosanguineous drainage noted. There is medium (34-66%) granulation within the wound bed. There is Wanda medium (34-66%) amount of necrotic tissue within the wound bed including Adherent Slough. Assessment Active Problems ICD-10 Type 2 diabetes mellitus with other skin ulcer Lymphedema, not elsewhere classified Non-pressure chronic ulcer of other part of left lower leg with fat layer exposed Essential (primary) hypertension Chronic combined systolic (congestive) and diastolic (congestive) heart failure Chronic kidney disease, stage 3 unspecified Patient's wound has shown improvement [name] size and appearance since last clinic visit. I debrided nonviable tissue. I recommended continuing the course with Hydrofera Blue under juxta light compression. Procedures Wound #2 Pre-procedure diagnosis of Wound #2 is Wanda Diabetic Wound/Ulcer of the Lower Extremity located on the Left,Lateral Lower Leg .Severity of Tissue Pre Debridement is: Fat layer exposed. There was Wanda Selective/Open Wound Non-Viable Tissue Debridement with Wanda total area of 0.8 sq cm performed by Kalman Shan, MD. With the following instrument(s): Curette to remove Non-Viable tissue/material. Material removed includes [facility] after achieving pain control using Lidocaine 4% T opical Solution. Wanda time out was conducted at 09:34, prior to the start of the  procedure. Wanda Minimum amount of bleeding was controlled with Pressure. The procedure was tolerated well. Post Debridement Measurements: 1cm length x 0.8cm width x 0.2cm depth; 0.126cm^3 volume. Character of Wound/Ulcer Post Debridement is stable. Severity of Tissue Post Debridement is: Fat layer exposed. Post procedure Diagnosis Wound #2: Same as Pre-Procedure Plan 1. [name] office sharp debridement 2. Hydrofera Blue 3. Juxta light compression 4. Follow-up [name] 1 week Electronic Signature(s) Signed: 09/09/2022 1:05:58 PM By: Kalman Shan DO Entered By: Kalman Shan on 09/09/2022 09:42:59 [name], [name] ([id number]) [id number] Page 7 of 8 -------------------------------------------------------------------------------- ROS/PFSH Details Patient Name: Date of Service: [name], [name] 09/09/2022 9:15 Wanda M Medical Record Number: [id number] Patient Account Number: [id number] Date of Birth/Sex: Treating RN: Feb 04, 1941 (82 y.o. Valetta Close Primary Care Provider: Tracie Harrier Other Clinician: Referring Provider: Treating Provider/Extender: Arn Medal Weeks [name]: 8 Hematologic/Lymphatic Medical History: Positive for: Lymphedema Cardiovascular Medical History: Positive for: Congestive Heart Failure; Hypertension Endocrine Medical History: Positive for: Type II Diabetes Time with diabetes: 3 years Treated with: Diet Immunizations Pneumococcal Vaccine: Received Pneumococcal Vaccination: Yes Received Pneumococcal Vaccination On or After 60th Birthday: Yes Implantable Devices None Family and Social History Never smoker; Marital Status - Widowed; Alcohol Use: Rarely; Drug Use: No History; Caffeine Use: Daily Electronic Signature(s) Signed: 09/09/2022 1:05:58 PM By: Kalman Shan DO Signed: 09/09/2022 3:25:33 PM By: Levora Dredge Entered By: Kalman Shan on 09/09/2022  09:43:33 -------------------------------------------------------------------------------- SuperBill Details Patient Name: Date of Service: [name], Wanda UDREY P. 09/09/2022 Medical Record Number: [id number] Patient Account Number: [id number] Date of Birth/Sex: Treating RN: Nov 19, 1940 (82 y.o. Valetta Close Primary Care Provider: Tracie Harrier Other Clinician: CAMILLIA, [name] ([id number]) [id number] Page 8 of 8 Referring Provider: Treating Provider/Extender: Arn Medal Weeks [name]: 8 Diagnosis Coding ICD-10 Codes Code Description E11.622 Type 2 diabetes mellitus with other skin ulcer I89.0 Lymphedema, not elsewhere classified L97.822 Non-pressure chronic ulcer of other part of left lower leg with fat layer exposed I10 Essential (primary) hypertension I50.42 Chronic combined systolic (congestive) and diastolic (congestive) heart failure N18.30 Chronic kidney disease, stage 3 unspecified Facility Procedures : CPT4  Code: [id number] Description: [id number] - DEBRIDE WOUND 1ST 20 SQ CM OR < ICD-10 Diagnosis Description L97.822 Non-pressure chronic ulcer of other part of left lower leg with fat layer expose E11.622 Type 2 diabetes mellitus with other skin ulcer Modifier: d Quantity: 1 Physician Procedures : CPT4 Code Description Modifier [id number] - WC PHYS DEBR WO ANESTH 20 SQ CM ICD-10 Diagnosis Description L97.822 Non-pressure chronic ulcer of other part of left lower leg with fat layer exposed E11.622 Type 2 diabetes mellitus with other skin ulcer Quantity: 1 Electronic Signature(s) Signed: 09/09/2022 1:05:58 PM By: Kalman Shan DO Signed: 09/09/2022 3:25:33 PM By: Levora Dredge Entered By: Levora Dredge on 09/09/2022 09:44:29

## 2022-09-13 ENCOUNTER — Encounter: Payer: Self-pay | Admitting: Gastroenterology

## 2022-09-15 NOTE — H&P (Signed)
Pre-Procedure H&P   Patient ID: [name] is a 82 y.o. female.  Gastroenterology Provider: Annamaria Helling, DO  Referring Provider: Dawson Bills, NP PCP: Tracie Harrier, MD  Date: 09/16/2022  HPI [name] is a 82 y.o. female who presents today for Colonoscopy for Surveillance-personal history of colon polyps .  Patient with a history of colon polyps.  Patient was also having bright red blood per rectum at that time.  She last underwent colonoscopy in May 2023 demonstrating 12 adenomatous polyps ranging in size from 2 to 6 mm.  A total of 10 tubular adenomas were removed, 1 TVA and 1 not retrieved.  1 hemostatic clip was placed in the right colon.  Also underwent colonoscopy in November 2019 demonstrating left-sided diverticulosis, 5 adenomatous polyps with 1 larger than 10 mm  More recently the patient has had right lower quadrant pain starting in December 2023/January 2024.  She was evaluated with CT which did not demonstrate significant findings aside from previously placed clip.  Patient notes that the pain is with standing and ambulating.  Most likely etiology is musculoskeletal. Still has this issue today.  Family history of polyps in her brother and paternal grandfather.  Most recent labs hemoglobin 14.5 MCV 93 platelets 166,000 creatinine 1.1  Status post appendectomy   Past Medical History:  Diagnosis Date   Acquired hammertoe    other   Acquired hammertoe    Angina pectoris, unspecified ([place])    Angina pectoris, unspecified ([place])    Arthritis    knees   Breast cancer ([place]) 1990   left breast   Cancer ([place]) 1990,s   left breast   CHF (congestive heart failure) ([place])    Chronic kidney disease    stage 2   Degenerative joint disease    Dermatophytosis of nail    Diabetes mellitus type 2, uncomplicated (HCC)    diet controlled   Dyspnea    Essential hypertension, benign    Exostosis    unspecified site   Fracture of right ankle 2010    Followed by Dr. Vickki Muff   GERD (gastroesophageal reflux disease)    Hemorrhoids    Hypercholesterolemia    Hyperlipidemia, unspecified    Hypertension    Lymphedema of leg    left leg worse than right leg.   Obesity, unspecified    Osteoarthrosis involving lower leg    unspecified whether generalized or localized    Pre-diabetes    Pure hypercholesterolemia    Vitamin D deficiency, unspecified    Wears dentures    partial upper    Past Surgical History:  Procedure Laterality Date   BREAST LUMPECTOMY Left 1995   BREAST SURGERY Left 1990   lumpectomy   BUNIONECTOMY Bilateral 1990's   plus hammer toe repair on left foot.   CATARACT EXTRACTION W/PHACO Left 08/10/2018   Procedure: CATARACT EXTRACTION PHACO AND INTRAOCULAR LENS PLACEMENT ([place])  LEFT DIABETIC;  Surgeon: Eulogio Bear, MD;  Location: [place];  Service: Ophthalmology;  Laterality: Left;  diabetic - diet controlled   CATARACT EXTRACTION W/PHACO Right 11/16/2018   Procedure: CATARACT EXTRACTION PHACO AND INTRAOCULAR LENS PLACEMENT ([place])  RIGHT DIABETIC;  Surgeon: Eulogio Bear, MD;  Location: [place];  Service: Ophthalmology;  Laterality: Right;  Diabetic - diet controlled   COLONOSCOPY     COLONOSCOPY N/A 11/01/2021   Procedure: COLONOSCOPY;  Surgeon: Annamaria Helling, DO;  Location: [facility] ENDOSCOPY;  Service: Gastroenterology;  Laterality: N/A;  DIET CONTROLLED   COLONOSCOPY WITH PROPOFOL N/A 05/08/2018   Procedure: COLONOSCOPY WITH PROPOFOL;  Surgeon: Lollie Sails, MD;  Location: [facility] ENDOSCOPY;  Service: Endoscopy;  Laterality: N/A;   ENDOMETRIAL BIOPSY     EYE SURGERY     FOOT SURGERY Left    for bunionectomy and hammertoe   JOINT REPLACEMENT     TONSILLECTOMY     TOTAL KNEE ARTHROPLASTY Left 04/08/2017   Procedure: TOTAL KNEE ARTHROPLASTY;  Surgeon: Corky Mull, MD;  Location: [facility] ORS;  Service: Orthopedics;  Laterality: Left;   TOTAL KNEE ARTHROPLASTY Right 08/19/2017    Procedure: TOTAL KNEE ARTHROPLASTY;  Surgeon: Corky Mull, MD;  Location: [facility] ORS;  Service: Orthopedics;  Laterality: Right;    Family History Brother and paternal grandfather with colon polyps No h/o GI disease or malignancy  Review of Systems  Constitutional:  Negative for activity change, appetite change, chills, diaphoresis, fatigue, fever and unexpected weight change.  HENT:  Negative for trouble swallowing and voice change.   Respiratory:  Negative for shortness of breath and wheezing.   Cardiovascular:  Negative for chest pain, palpitations and leg swelling.  Gastrointestinal:  Positive for abdominal pain (RLQ) and constipation. Negative for abdominal distention, anal bleeding, blood in stool, diarrhea, nausea, rectal pain and vomiting.  Musculoskeletal:  Negative for arthralgias and myalgias.  Skin:  Negative for color change and pallor.  Neurological:  Negative for dizziness, syncope and weakness.  Psychiatric/Behavioral:  Negative for confusion.   All other systems reviewed and are negative.    Medications No current facility-administered medications on file prior to encounter.   Current Outpatient Medications on File Prior to Encounter  Medication Sig Dispense Refill   albuterol (PROVENTIL HFA;VENTOLIN HFA) 108 (90 Base) MCG/ACT inhaler Inhale 2 puffs into the lungs every 6 (six) hours as needed for wheezing or shortness of breath. 1 Inhaler 0   atorvastatin (LIPITOR) 20 MG tablet Take 1 tablet by mouth daily.     ergocalciferol (VITAMIN D2) 1.25 MG (50000 UT) capsule Take 50,000 Units by mouth once a week.     Fluticasone-Umeclidin-Vilant (TRELEGY ELLIPTA) 100-62.5-25 MCG/INH AEPB Inhale into the lungs as needed.     furosemide (LASIX) 40 MG tablet Take one tablet by mouth once daily as needed for lower extremity edema     gabapentin (NEURONTIN) 300 MG capsule Take 300 mg by mouth at bedtime.     magnesium oxide (MAG-OX) 400 MG tablet Take 800 mg by mouth daily.       Omega-3 Krill Oil 500 MG CAPS Take 500 mg by mouth 3 (three) times a week. On Monday Wednesday and Friday     omeprazole (PRILOSEC) 20 MG capsule Take 20 mg by mouth daily. Acid reflux      oxyCODONE-acetaminophen (PERCOCET/ROXICET) 5-325 MG tablet Take 1 tablet by mouth every 4 (four) hours as needed for pain.     TOPROL XL 25 MG 24 hr tablet Take 25 mg by mouth at bedtime.     triamterene-hydrochlorothiazide (DYAZIDE) 37.5-25 MG capsule Take 1 capsule by mouth daily.     WIXELA INHUB 250-50 MCG/ACT AEPB Inhale 1 puff into the lungs 2 (two) times daily.     acetaminophen (TYLENOL) 325 MG tablet Take 1,000 mg by mouth 2 (two) times daily.      Blood Glucose Monitoring Suppl (FIFTY50 GLUCOSE METER 2.0) w/Device KIT Use as directed BAYER CONTOUR NEXT METER DX: E11.9     CONTOUR NEXT TEST test strip  hydrocortisone (ANUSOL-HC) 2.5 % rectal cream Place 1 application rectally 2 (two) times daily.     nystatin cream (MYCOSTATIN) Apply 1 application topically 2 (two) times daily as needed. Apply thin film to yeast rash under the breasts     polyethylene glycol-electrolytes (NULYTELY) 420 g solution Take 4,000 mLs by mouth once.     sennosides-docusate sodium (SENOKOT-S) 8.6-50 MG tablet Take 2 tablets by mouth 2 (two) times daily.      solifenacin (VESICARE) 5 MG tablet Take 5 mg by mouth daily. (Patient not taking: Reported on 09/16/2022)     traMADol (ULTRAM) 50 MG tablet Take 50 mg by mouth every 6 (six) hours as needed.      Pertinent medications related to GI and procedure were reviewed by me with the patient prior to the procedure   Current Facility-Administered Medications:    0.9 %  sodium chloride infusion, , Intravenous, Continuous, Annamaria Helling, DO, Last Rate: 20 mL/hr at 09/16/22 [id number], New Bag at 09/16/22 [id number]      Allergies  Allergen Reactions   Colesevelam Other (See Comments)    constipation   Oxybutynin Swelling    Leg swelling   Rosuvastatin Other (See  Comments)    Pain in lower extremities   Zoster Vaccine Recombinant, Adjuvanted Hives   Allergies were reviewed by me prior to the procedure  Objective   Body mass index is 36.77 kg/m. Vitals:   09/16/22 0934  BP: 136/77  Pulse: 62  Resp: 16  Temp: (!) 97 F (36.1 C)  TempSrc: Temporal  SpO2: 100%  Weight: 112.9 kg  Height: 5\' 9"  (1.753 m)     Physical Exam Vitals and nursing note reviewed.  Constitutional:      General: She is not in acute distress.    Appearance: Normal appearance. She is obese. She is not ill-appearing, toxic-appearing or diaphoretic.  HENT:     Head: Normocephalic and atraumatic.     Nose: Nose normal.     Mouth/Throat:     Mouth: Mucous membranes are moist.     Pharynx: Oropharynx is clear.  Eyes:     General: No scleral icterus.    Extraocular Movements: Extraocular movements intact.  Cardiovascular:     Rate and Rhythm: Regular rhythm. Bradycardia present.     Heart sounds: Normal heart sounds. No murmur heard.    No friction rub. No gallop.  Pulmonary:     Effort: Pulmonary effort is normal. No respiratory distress.     Breath sounds: Normal breath sounds. No wheezing, rhonchi or rales.  Abdominal:     General: Bowel sounds are normal. There is no distension.     Palpations: Abdomen is soft.     Tenderness: There is no abdominal tenderness. There is no guarding or rebound.  Musculoskeletal:     Cervical back: Neck supple.     Right lower leg: No edema.     Left lower leg: No edema.  Skin:    General: Skin is warm and dry.     Coloration: Skin is not jaundiced or pale.  Neurological:     General: No focal deficit present.     Mental Status: She is alert and oriented to person, place, and time. Mental status is at baseline.  Psychiatric:        Mood and Affect: Mood normal.        Behavior: Behavior normal.        Thought Content: Thought content normal.  Judgment: Judgment normal.      Assessment:  [name]  is a 82 y.o. female  who presents today for Colonoscopy for Surveillance-personal history of colon polyps .  Plan:  Colonoscopy with possible intervention today  Colonoscopy with possible biopsy, control of bleeding, polypectomy, and interventions as necessary has been discussed with the patient/patient representative. Informed consent was obtained from the patient/patient representative after explaining the indication, nature, and risks of the procedure including but not limited to death, bleeding, perforation, missed neoplasm/lesions, cardiorespiratory compromise, and reaction to medications. Opportunity for questions was given and appropriate answers were provided. Patient/patient representative has verbalized understanding is amenable to undergoing the procedure.   Annamaria Helling, DO  [facility] Gastroenterology  Portions of the record may have been created with voice recognition software. Occasional wrong-word or 'sound-a-like' substitutions may have occurred due to the inherent limitations of voice recognition software.  Read the chart carefully and recognize, using context, where substitutions may have occurred.

## 2022-09-16 ENCOUNTER — Ambulatory Visit: Payer: Medicare Other | Admitting: Physician Assistant

## 2022-09-16 ENCOUNTER — Ambulatory Visit: Payer: Medicare Other | Admitting: Anesthesiology

## 2022-09-16 ENCOUNTER — Encounter: Payer: Self-pay | Admitting: Gastroenterology

## 2022-09-16 ENCOUNTER — Encounter
Admission: RE | Disposition: A | Discharge status: Home / Self Care | Payer: Self-pay | Source: Home / Self Care | Attending: Gastroenterology

## 2022-09-16 ENCOUNTER — Ambulatory Visit
Admission: RE | Admit: 2022-09-16 | Discharge: 2022-09-16 | Disposition: A | Discharge status: Home / Self Care | Payer: Medicare Other | Attending: Gastroenterology | Admitting: Gastroenterology

## 2022-09-16 ENCOUNTER — Other Ambulatory Visit: Payer: Self-pay

## 2022-09-16 DIAGNOSIS — K5730 Diverticulosis of large intestine without perforation or abscess without bleeding: Secondary | ICD-10-CM | POA: Diagnosis not present

## 2022-09-16 DIAGNOSIS — K5140 Inflammatory polyps of colon without complications: Secondary | ICD-10-CM | POA: Diagnosis not present

## 2022-09-16 DIAGNOSIS — K6389 Other specified diseases of intestine: Secondary | ICD-10-CM | POA: Insufficient documentation

## 2022-09-16 DIAGNOSIS — I130 Hypertensive heart and chronic kidney disease with heart failure and stage 1 through stage 4 chronic kidney disease, or unspecified chronic kidney disease: Secondary | ICD-10-CM | POA: Insufficient documentation

## 2022-09-16 DIAGNOSIS — R1031 Right lower quadrant pain: Secondary | ICD-10-CM | POA: Insufficient documentation

## 2022-09-16 DIAGNOSIS — K640 First degree hemorrhoids: Secondary | ICD-10-CM | POA: Insufficient documentation

## 2022-09-16 DIAGNOSIS — Z09 Encounter for follow-up examination after completed treatment for conditions other than malignant neoplasm: Secondary | ICD-10-CM | POA: Diagnosis present

## 2022-09-16 DIAGNOSIS — K635 Polyp of colon: Secondary | ICD-10-CM | POA: Diagnosis not present

## 2022-09-16 DIAGNOSIS — I509 Heart failure, unspecified: Secondary | ICD-10-CM | POA: Diagnosis not present

## 2022-09-16 DIAGNOSIS — D123 Benign neoplasm of transverse colon: Secondary | ICD-10-CM | POA: Diagnosis not present

## 2022-09-16 DIAGNOSIS — K219 Gastro-esophageal reflux disease without esophagitis: Secondary | ICD-10-CM | POA: Insufficient documentation

## 2022-09-16 DIAGNOSIS — E1151 Type 2 diabetes mellitus with diabetic peripheral angiopathy without gangrene: Secondary | ICD-10-CM | POA: Insufficient documentation

## 2022-09-16 DIAGNOSIS — D120 Benign neoplasm of cecum: Secondary | ICD-10-CM | POA: Insufficient documentation

## 2022-09-16 DIAGNOSIS — N182 Chronic kidney disease, stage 2 (mild): Secondary | ICD-10-CM | POA: Diagnosis not present

## 2022-09-16 DIAGNOSIS — D125 Benign neoplasm of sigmoid colon: Secondary | ICD-10-CM | POA: Diagnosis not present

## 2022-09-16 DIAGNOSIS — E1122 Type 2 diabetes mellitus with diabetic chronic kidney disease: Secondary | ICD-10-CM | POA: Diagnosis not present

## 2022-09-16 DIAGNOSIS — Z9049 Acquired absence of other specified parts of digestive tract: Secondary | ICD-10-CM | POA: Diagnosis not present

## 2022-09-16 DIAGNOSIS — D122 Benign neoplasm of ascending colon: Secondary | ICD-10-CM | POA: Insufficient documentation

## 2022-09-16 HISTORY — PX: COLONOSCOPY: SHX5424

## 2022-09-16 LAB — GLUCOSE, CAPILLARY: Glucose-Capillary: 105 mg/dL — ABNORMAL HIGH (ref 70–99)

## 2022-09-16 SURGERY — COLONOSCOPY
Anesthesia: General

## 2022-09-16 MED ORDER — PROPOFOL 10 MG/ML IV BOLUS
INTRAVENOUS | Status: DC | PRN
  Administered 2022-09-16: 20 mg via INTRAVENOUS
  Administered 2022-09-16: 60 mg via INTRAVENOUS
  Administered 2022-09-16: 20 mg via INTRAVENOUS

## 2022-09-16 MED ORDER — EPHEDRINE 5 MG/ML INJ
INTRAVENOUS | Status: AC
  Filled 2022-09-16: qty 5

## 2022-09-16 MED ORDER — PROPOFOL 500 MG/50ML IV EMUL
INTRAVENOUS | Status: DC | PRN
  Administered 2022-09-16: 140 ug/kg/min via INTRAVENOUS

## 2022-09-16 MED ORDER — LIDOCAINE HCL (CARDIAC) PF 100 MG/5ML IV SOSY
PREFILLED_SYRINGE | INTRAVENOUS | Status: DC | PRN
  Administered 2022-09-16: 20 mg via INTRAVENOUS

## 2022-09-16 MED ORDER — EPHEDRINE SULFATE (PRESSORS) 50 MG/ML IJ SOLN
INTRAMUSCULAR | Status: DC | PRN
  Administered 2022-09-16 (×2): 7.5 mg via INTRAVENOUS

## 2022-09-16 MED ORDER — SODIUM CHLORIDE 0.9 % IV SOLN: INTRAVENOUS | Status: DC

## 2022-09-16 NOTE — Anesthesia Postprocedure Evaluation (Signed)
Anesthesia Post Note  Patient: [name]  Procedure(s) Performed: COLONOSCOPY  Patient location during evaluation: Endoscopy Anesthesia Type: General Level of consciousness: awake and alert Pain management: pain level controlled Vital Signs Assessment: post-procedure vital signs reviewed and stable Respiratory status: spontaneous breathing, nonlabored ventilation, respiratory function stable and patient connected to nasal cannula oxygen Cardiovascular status: blood pressure returned to baseline and stable Postop Assessment: no apparent nausea or vomiting Anesthetic complications: no   No notable events documented.   Last Vitals:  Vitals:   09/16/22 1109 09/16/22 1119  BP: 131/63 (!) 152/74  Pulse: 65 66  Resp: 17 13  Temp:    SpO2: 100% 100%    Last Pain:  Vitals:   09/16/22 1119  TempSrc:   PainSc: 0-No pain                 Ilene Qua

## 2022-09-16 NOTE — Transfer of Care (Signed)
Immediate Anesthesia Transfer of Care Note  Patient: [name]  Procedure(s) Performed: COLONOSCOPY  Patient Location: PACU  Anesthesia Type:General  Level of Consciousness: drowsy  Airway & Oxygen Therapy: Patient Spontanous Breathing  Post-op Assessment: Report given to RN and Post -op Vital signs reviewed and stable  Post vital signs: Reviewed and stable  Last Vitals:  Vitals Value Taken Time  BP 127/58 09/16/22 1059  Temp 35.4 C 09/16/22 1059  Pulse 74 09/16/22 1059  Resp 20 09/16/22 1059  SpO2 100 % 09/16/22 1059    Last Pain:  Vitals:   09/16/22 1059  TempSrc: Temporal  PainSc: Asleep         Complications: No notable events documented.

## 2022-09-16 NOTE — Anesthesia Preprocedure Evaluation (Addendum)
Anesthesia Evaluation  Patient identified by MRN, date of birth, ID band Patient awake    Reviewed: Allergy & Precautions, NPO status , Patient's Chart, lab work & pertinent test results  History of Anesthesia Complications Negative for: history of anesthetic complications  Airway Mallampati: III   Neck ROM: Full    Dental  (+) Partial Upper   Pulmonary neg pulmonary ROS   Pulmonary exam normal breath sounds clear to auscultation       Cardiovascular hypertension, + Peripheral Vascular Disease and +CHF (diastolic)  Normal cardiovascular exam Rhythm:Regular Rate:Normal  Echo 12/02/16: normal left ventricular function, with LVEF greater than 55%, with mild tricuspid regurgitation B/l carotid disease    Neuro/Psych negative neurological ROS     GI/Hepatic ,GERD  ,,  Endo/Other  diabetes, Type 2    Renal/GU Renal disease (stage II CKD)     Musculoskeletal  (+) Arthritis ,    Abdominal   Peds  Hematology Breast CA   Anesthesia Other Findings   Reproductive/Obstetrics                             Anesthesia Physical Anesthesia Plan  ASA: 2  Anesthesia Plan: General   Post-op Pain Management:    Induction: Intravenous  PONV Risk Score and Plan: 3 and Propofol infusion, TIVA and Treatment may vary due to age or medical condition  Airway Management Planned: Natural Airway  Additional Equipment:   Intra-op Plan:   Post-operative Plan:   Informed Consent: I have reviewed the patients History and Physical, chart, labs and discussed the procedure including the risks, benefits and alternatives for the proposed anesthesia with the patient or authorized representative who has indicated his/her understanding and acceptance.       Plan Discussed with: CRNA  Anesthesia Plan Comments: (LMA/GETA backup discussed.  Patient consented for risks of anesthesia including but not limited to:  -  adverse reactions to medications - damage to eyes, teeth, lips or other oral mucosa - nerve damage due to positioning  - sore throat or hoarseness - damage to heart, brain, nerves, lungs, other parts of body or loss of life  Informed patient about role of CRNA in peri- and intra-operative care.  Patient voiced understanding.)        Anesthesia Quick Evaluation

## 2022-09-16 NOTE — Interval H&P Note (Signed)
History and Physical Interval Note: Preprocedure H&P from 09/16/22  was reviewed and there was no interval change after seeing and examining the patient.  Written consent was obtained from the patient after discussion of risks, benefits, and alternatives. Patient has consented to proceed with Colonoscopy with possible intervention   09/16/2022 10:02 AM  [name]  has presented today for surgery, with the diagnosis of Hx of adenomatous polyp of colon (Z86.010).  The various methods of treatment have been discussed with the patient and family. After consideration of risks, benefits and other options for treatment, the patient has consented to  Procedure(s): COLONOSCOPY (N/A) as a surgical intervention.  The patient's history has been reviewed, patient examined, no change in status, stable for surgery.  I have reviewed the patient's chart and labs.  Questions were answered to the patient's satisfaction.     Annamaria Helling

## 2022-09-16 NOTE — Op Note (Signed)
[facility] Patient Name: [name] Procedure Date: 09/16/2022 9:54 AM MRN: [id number] Account #: [id number] Date of Birth: Nov 13, 1940 Admit Type: Outpatient Age: 82 Room: [facility] ENDO ROOM 2 Gender: Female Note Status: Finalized Instrument Name: Colonoscope [id number] Procedure:             Colonoscopy Indications:           High risk colon cancer surveillance: Personal history                         of colonic polyps Providers:             Annamaria Helling DO, DO Referring MD:          Tracie Harrier, MD (Referring MD) Medicines:             Monitored Anesthesia Care Complications:         No immediate complications. Estimated blood loss:                         Minimal. Procedure:             Pre-Anesthesia Assessment:                        - Prior to the procedure, a History and Physical was                         performed, and patient medications and allergies were                         reviewed. The patient is competent. The risks and                         benefits of the procedure and the sedation options and                         risks were discussed with the patient. All questions                         were answered and informed consent was obtained.                         Patient identification and proposed procedure were                         verified by the physician, the nurse, the anesthetist                         and the technician in the endoscopy suite. Mental                         Status Examination: alert and oriented. Airway                         Examination: normal oropharyngeal airway and neck                         mobility. Respiratory Examination: clear to  auscultation. CV Examination: RRR, no murmurs, no S3                         or S4. Prophylactic Antibiotics: The patient does not                         require prophylactic antibiotics. Prior                          Anticoagulants: The patient has taken no anticoagulant                         or antiplatelet agents. ASA Grade Assessment: III - A                         patient with severe systemic disease. After reviewing                         the risks and benefits, the patient was deemed in                         satisfactory condition to undergo the procedure. The                         anesthesia plan was to use monitored anesthesia care                         (MAC). Immediately prior to administration of                         medications, the patient was re-assessed for adequacy                         to receive sedatives. The heart rate, respiratory                         rate, oxygen saturations, blood pressure, adequacy of                         pulmonary ventilation, and response to care were                         monitored throughout the procedure. The physical                         status of the patient was re-assessed after the                         procedure.                        After obtaining informed consent, the colonoscope was                         passed under direct vision. Throughout the procedure,                         the patient's blood pressure, pulse, and oxygen  saturations were monitored continuously. The                         Colonoscope was introduced through the anus and                         advanced to the the terminal ileum, with                         identification of the appendiceal orifice and IC                         valve. The colonoscopy was performed without                         difficulty. The patient tolerated the procedure well.                         The quality of the bowel preparation was evaluated                         using the BBPS [facility] Bowel Preparation Scale) with                         scores of: Right Colon = 3 (entire mucosa seen well                         with no residual staining,  small fragments of stool or                         opaque liquid), Transverse Colon = 2 (minor amount of                         residual staining, small fragments of stool and/or                         opaque liquid, but mucosa seen well) and Left Colon =                         2 (minor amount of residual staining, small fragments                         of stool and/or opaque liquid, but mucosa seen well).                         The total BBPS score equals 7. The quality of the                         bowel preparation was good. The terminal ileum,                         ileocecal valve, appendiceal orifice, and rectum were                         photographed. Findings:      The perianal and digital rectal examinations were normal. Pertinent       negatives include normal sphincter  tone.      Non-bleeding internal hemorrhoids were found during retroflexion. The       hemorrhoids were Grade I (internal hemorrhoids that do not prolapse).       Estimated blood loss: none.      Multiple small-mouthed diverticula were found in the left colon.       Estimated blood loss: none.      Five sessile polyps were found in the sigmoid colon (1), transverse       colon (2) and ascending colon (2). The polyps were 1 to 2 mm in size.       These polyps were removed with a jumbo cold forceps. Resection and       retrieval were complete. One of the ascending areas appeared to be       residual polyp near polypectomy scar. Estimated blood loss was minimal.      A 6 to 7 mm polyp was found in the appendiceal orifice. The polyp was       sessile. The polyp was removed with a cold snare. Resection and       retrieval were complete. Estimated blood loss was minimal.      A 13 to 15 mm polyp was found in the transverse colon. The polyp was       sessile. The polyp was removed with a hot snare. The polyp was removed       with a saline injection-lift technique using a hot snare. Resection and        retrieval were complete. NBI was used to assess margins for EMR.       Estimated blood loss was minimal. To prevent bleeding after the       polypectomy, one hemostatic clip was successfully placed (MR       conditional). There was no bleeding at the end of the procedure.       Estimated blood loss: none. STSC was applied to polypectomy edges prior       to clip placement.      The exam was otherwise without abnormality on direct and retroflexion       views. Impression:            - Non-bleeding internal hemorrhoids.                        - Diverticulosis in the left colon.                        - Five 1 to 2 mm polyps in the sigmoid colon, in the                         transverse colon and in the ascending colon, removed                         with a jumbo cold forceps. Resected and retrieved.                        - One 6 to 7 mm polyp at the appendiceal orifice,                         removed with a cold snare. Resected and retrieved.                        -  One 13 to 15 mm polyp in the transverse colon.                         Resected and retrieved. Clip (MR conditional) was                         placed.                        - The examination was otherwise normal on direct and                         retroflexion views. Recommendation:        - Patient has a contact number available for                         emergencies. The signs and symptoms of potential                         delayed complications were discussed with the patient.                         Return to normal activities tomorrow. Written                         discharge instructions were provided to the patient.                        - Discharge patient to home.                        - Resume previous diet.                        - Continue present medications.                        - No aspirin, ibuprofen, naproxen, or other                         non-steroidal anti-inflammatory drugs for 5 days  after                         polyp removal.                        - Await pathology results.                        - Repeat colonoscopy for surveillance based on                         pathology results.                        - Return to GI office as previously scheduled.                        - The findings and recommendations were discussed with  the patient. Procedure Code(s):     --- Professional ---                        [phone], Colonoscopy, flexible; with removal of                         tumor(s), polyp(s), or other lesion(s) by snare                         technique                        45380, 61, Colonoscopy, flexible; with biopsy, single                         or multiple                        45381, Colonoscopy, flexible; with directed submucosal                         injection(s), any substance Diagnosis Code(s):     --- Professional ---                        Z86.010, Personal history of colonic polyps                        K64.0, First degree hemorrhoids                        D12.5, Benign neoplasm of sigmoid colon                        D12.3, Benign neoplasm of transverse colon (hepatic                         flexure or splenic flexure)                        D12.2, Benign neoplasm of ascending colon                        D12.1, Benign neoplasm of appendix                        K57.30, Diverticulosis of large intestine without                         perforation or abscess without bleeding CPT copyright 2022 [employer]. All rights reserved. The codes documented in this report are preliminary and upon coder review may  be revised to meet current compliance requirements. Attending Participation:      I personally performed the entire procedure. Volney American, DO Annamaria Helling DO, DO 09/16/2022 11:05:45 AM This report has been signed electronically. Number of Addenda: 0 Note Initiated On: 09/16/2022 9:54  AM Scope Withdrawal Time: 0 hours 30 minutes 2 seconds  Total Procedure Duration: 0 hours 36 minutes 55 seconds  Estimated Blood Loss:  Estimated blood loss was minimal.      [facility]

## 2022-09-17 ENCOUNTER — Encounter: Payer: Self-pay | Admitting: Gastroenterology

## 2022-09-17 LAB — SURGICAL PATHOLOGY

## 2022-09-19 ENCOUNTER — Encounter: Payer: Medicare Other | Admitting: Physician Assistant

## 2022-09-19 ENCOUNTER — Other Ambulatory Visit: Payer: Self-pay | Admitting: Orthopedic Surgery

## 2022-09-19 DIAGNOSIS — M25531 Pain in right wrist: Secondary | ICD-10-CM

## 2022-09-19 DIAGNOSIS — M654 Radial styloid tenosynovitis [de Quervain]: Secondary | ICD-10-CM

## 2022-09-23 ENCOUNTER — Encounter: Payer: Medicare Other | Admitting: Physician Assistant

## 2022-09-23 DIAGNOSIS — E11622 Type 2 diabetes mellitus with other skin ulcer: Secondary | ICD-10-CM | POA: Diagnosis not present

## 2022-09-23 NOTE — Progress Notes (Signed)
RAINA, [name] ([id number]) [id number] Page 1 of 3 Visit Report for 09/23/2022 Arrival Information Details Patient Name: Date of Service: [name], [name] 09/23/2022 3:30 PM Medical Record Number: [id number] Patient Account Number: [id number] Date of Birth/Sex: Treating RN: 20-May-1941 (82 y.o. [name] Primary Care Elainna Eshleman: Tracie Harrier Other Clinician: Referring Kainan Patty: Treating Sharia Averitt/Extender: Solon Palm Weeks in Treatment: 10 Visit Information History Since Last Visit Added or deleted any medications: No Patient Arrived: Ambulatory Has Dressing in Place as Prescribed: Yes Arrival Time: 15:16 Pain Present Now: No Accompanied By: self Transfer Assistance: None Patient Requires Transmission-Based Precautions: No Patient Has Alerts: Yes Patient Alerts: 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 08/14/21 R 1.04 TBI .96 08/14/21 Electronic Signature(s) Signed: 09/23/2022 4:41:57 PM By: Rosalio Loud MSN RN CNS WTA Entered By: Rosalio Loud on 09/23/2022 15:17:10 -------------------------------------------------------------------------------- Lower Extremity Assessment Details Patient Name: Date of Service: [name], [name] 09/23/2022 3:30 PM Medical Record Number: [id number] Patient Account Number: [id number] Date of Birth/Sex: Treating RN: [birth year] (82 y.o. [name] Primary Care [name]: Tracie Harrier Other Clinician: Referring Shafiq Larch: Treating Jester Klingberg/Extender: Solon Palm Weeks in Treatment: 10 Edema Assessment Assessed: [Left: No] [Right: No] [Left: Edema] [Right: :] Calf Left: Right: Point of Measurement: 35 cm From Medial Instep 41 cm Ankle Left: Right: Point of Measurement: 10 cm From Medial Instep 25 cm Vascular Assessment Pulses: Dorsalis Pedis Palpable: [Left:Yes] Electronic Signature(s) Signed: 09/23/2022 4:41:57 PM By: Rosalio Loud MSN RN CNS WTA Entered By: Rosalio Loud on 09/23/2022  15:24:40 -------------------------------------------------------------------------------- Pain Assessment Details Patient Name: Date of Service: [name], [name] 09/23/2022 3:30 PM Medical Record Number: [id number] Patient Account Number: [id number] Date of Birth/Sex: Treating RN: 1941/03/10 (82 y.o. [name] Primary Care Emileigh Kellett: Tracie Harrier Other Clinician: JONISHA, LEPPO ([id number]) [id number] Page 2 of 3 Referring [name]: Treating Samary Shatz/Extender: Nadara Mustard, Vishwanath Weeks in Treatment: 10 Active Problems Location of Pain Severity and Description of Pain Patient Has Paino No Site Locations Pain Management and Medication Current Pain Management: Electronic Signature(s) Signed: 09/23/2022 4:41:57 PM By: Rosalio Loud MSN RN CNS WTA Entered By: Rosalio Loud on 09/23/2022 15:19:38 -------------------------------------------------------------------------------- Wound Assessment Details Patient Name: Date of Service: [name], [name] 09/23/2022 3:30 PM Medical Record Number: [id number] Patient Account Number: [id number] Date of Birth/Sex: Treating RN: 1941/04/27 (83 y.o. [name] Primary Care [name]: Tracie Harrier Other Clinician: Referring Michial Disney: Treating Simon Llamas/Extender: Solon Palm Weeks in Treatment: 10 Wound Status Wound Number: 2 Primary Diabetic Wound/Ulcer of the Lower Extremity Etiology: Wound Location: Left, Lateral Lower Leg Wound Status: Open Wounding Event: Gradually Appeared Comorbid Lymphedema, Congestive Heart Failure, Hypertension, Type Date Acquired: 05/01/2022 History: II Diabetes Weeks Of Treatment: 10 Clustered Wound: No Photos Wound Measurements Length: (cm) 0.5 Width: (cm) 0.4 Depth: (cm) 0.2 Area: (cm) 0.157 Costilow, Amalea P ([id number]) Volume: (cm) 0.031 % Reduction in Area: 94.4% % Reduction in Volume: 94.5% Epithelialization: Small  (1-33%) [phone] Page 3 of 3 Wound Description Classification: Grade 2 Exudate Amount: Medium Exudate Type: Serosanguineous Exudate Color: red, brown Foul Odor After Cleansing: No Slough/Fibrino Yes Wound Bed Granulation Amount: Medium (34-66%) Exposed Structure Necrotic Amount: Medium (34-66%) Fascia Exposed: No Necrotic Quality: Adherent Slough Fat Layer (Subcutaneous Tissue) Exposed: Yes Tendon Exposed: No Muscle Exposed: No Joint Exposed: No Bone Exposed: No Electronic Signature(s) Signed: 09/23/2022 4:41:57 PM By: Rosalio Loud MSN RN CNS WTA Entered By: Rosalio Loud on 09/23/2022 15:22:46 -------------------------------------------------------------------------------- Vitals Details Patient Name: Date of Service:  [name], [name] 09/23/2022 3:30 PM Medical Record Number: [id number] Patient Account Number: [id number] Date of Birth/Sex: Treating RN: 1941-03-13 (82 y.o. [name] Primary Care Json Koelzer: Tracie Harrier Other Clinician: Referring Irving Lubbers: Treating Rexton Greulich/Extender: Solon Palm Weeks in Treatment: 10 Vital Signs Time Taken: 15:17 Temperature (F): 97.9 Height (in): 69 Pulse (bpm): 61 Weight (lbs): 250 Respiratory Rate (breaths/min): 16 Body Mass Index (BMI): 36.9 Blood Pressure (mmHg): 143/64 Reference Range: 80 - 120 mg / dl Electronic Signature(s) Signed: 09/23/2022 4:41:57 PM By: Rosalio Loud MSN RN CNS WTA Entered By: Rosalio Loud on 09/23/2022 15:19:32

## 2022-09-23 NOTE — Progress Notes (Signed)
[name] ([id number]) [id number] Page 1 of 6 Visit Report for 09/23/2022 Chief Complaint Document Details Patient Name: Date of Service: [place] 09/23/2022 3:30 PM Medical Record Number: [id number] Patient Account Number: [id number] Date of Birth/Sex: Treating RN: 30-Jul-1940 (82 y.o. [name] Primary Care Provider: Tracie [name] Other Clinician: Referring Provider: Treating Provider/Extender: [name] Weeks in Treatment: 10 Information Obtained from: Patient Chief Complaint Left LE Ulcers Electronic Signature(s) Signed: 09/23/2022 3:32:39 PM By: Wanda Keeler PA-C Entered By: [name] on 09/23/2022 15:32:39 -------------------------------------------------------------------------------- HPI Details Patient Name: Date of Service: [name] 09/23/2022 3:30 PM Medical Record Number: [id number] Patient Account Number: [id number] Date of Birth/Sex: Treating RN: Oct 03, 1940 (82 y.o. [name] Primary Care Provider: Tracie [name] Other Clinician: Referring Provider: Treating Provider/Extender: [name] Weeks in Treatment: 10 History of Present Illness HPI Description: 07-12-2022 upon evaluation today patient presents for initial inspection concerning issues she has been having with wounds on her left lower extremity. She thinks she may have hit the lateral portion of her leg on the dishwasher door but the other she thinks may have been a tape injury. It is more of a blister type situation. With that being said she tells me that she is having some discomfort although is not terrible at this point. Fortunately I do not see any signs of infection locally or systemically at this time which is great news. No fevers, chills, nausea, vomiting, or diarrhea. Patient does have a history of diabetes mellitus type 2, lymphedema for which she is not really able to wear compression  socks that she tells me she cannot get them on. She also has hypertension, congestive heart failure, and chronic kidney disease stage III. 07-26-2022 upon evaluation today patient appears to be doing well currently in regard to her wound. The compression wrapping does seem to be helping I am good I perform some debridement in regard to the left lateral leg but the other wound is actually looking quite well. I think we are headed in the right direction. Of note she did put her compression wrap down she was stating that this was getting too tight. 08-02-2022 upon evaluation today patient appears to be doing well currently in regard to her wound. She is actually showing signs of improvement in regard to both and I am extremely pleased with where we stand today. I do not see any evidence of active infection locally nor systemically which is great news. No fevers, chills, nausea, vomiting, or diarrhea. 08-09-2022 upon evaluation today patient appears to be doing well currently in regard to her wound. She has been tolerating the dressing changes without complication and overall 1 wound is healed the other is doing much better. Fortunately I do not see any signs of active infection locally or systemically which is great news. 08-16-2022 upon evaluation today patient appears to be doing well currently in regard to her wound although this is still showing some signs of being slow to heal. She keeps pushing the compression wrap down she tells me it gets too tight at the top we try to make some adjustments but is just not working for her. With that being said I think that we are probably going to switch to Tubigrip since she is not able to keep this up on more concerned about it causing additional damage to her leg and obviously is not helping it heal either if that is what is happening. 08-23-2022  upon evaluation today patient appears to be doing well currently in regard to her wound. This is showing signs of some  improvement but fortunately though it is doing better and unfortunately is still showing a lot of swelling in regard to her leg we have been having a hard time getting something on what she is able to keep on she is having a lot of discomfort a lot of ways. Fortunately I do not see any signs of active infection locally nor systemically which is great news. No fevers, chills, nausea, vomiting, or diarrhea. 08-30-2022 upon evaluation today patient appears to be doing well with regard to her wound. This is showing signs of being much smaller is doing quite well and overall I am extremely pleased with where we stand I do not see any signs of infection locally nor systemically at this point. 3/11; patient presents for follow-up. She has been using Hydrofera Blue along with her juxta light compression daily. She has no issues or complaints today. 09-23-2022 upon evaluation today patient appears to be doing well currently in regard to her wound which is actually measuring significantly smaller. I am actually very pleased with where we stand I think we are headed in the right direction and she is extremely happy to hear this as well. Electronic Signature(s) Signed: 09/24/2022 6:35:33 PM By: [name], [name] ([id number]) [id number] Page 2 of 6 Entered By: [name] on 09/24/2022 18:35:32 -------------------------------------------------------------------------------- Physical Exam Details Patient Name: Date of Service: [name] 09/23/2022 3:30 PM Medical Record Number: [id number] Patient Account Number: [id number] Date of Birth/Sex: Treating RN: [birth year] (82 y.o. [name] Primary Care Provider: Tracie [name] Other Clinician: Referring Provider: Treating Provider/Extender: [name] Weeks in Treatment: 10 Constitutional Obese and well-hydrated in no acute distress. Respiratory normal breathing without  difficulty. Psychiatric this patient is able to make decisions and demonstrates good insight into disease process. Alert and Oriented x 3. pleasant and cooperative. Notes Upon inspection patient's wound bed showed signs of good granulation epithelization no need for sharp debridement and overall I am extremely pleased where things stand currently I do not think that she is showing any signs of worsening overall which is great news. with Electronic Signature(s) Signed: 09/24/2022 6:35:52 PM By: Wanda Keeler PA-C Entered By: [name] on 09/24/2022 18:35:52 -------------------------------------------------------------------------------- Physician Orders Details Patient Name: Date of Service: [place], A Dreama Saa. 09/23/2022 3:30 PM Medical Record Number: [id number] Patient Account Number: [id number] Date of Birth/Sex: Treating RN: 1940/12/10 (82 y.o. [name] Primary Care Provider: Tracie [name] Other Clinician: Referring Provider: Treating Provider/Extender: [name] Weeks in Treatment: 10 Verbal / Phone Orders: No Diagnosis Coding ICD-10 Coding Code Description E11.622 Type 2 diabetes mellitus with other skin ulcer I89.0 Lymphedema, not elsewhere classified L97.822 Non-pressure chronic ulcer of other part of left lower leg with fat layer exposed I10 Essential (primary) hypertension I50.42 Chronic combined systolic (congestive) and diastolic (congestive) heart failure N18.30 Chronic kidney disease, stage 3 unspecified Follow-up Appointments Return Appointment in 1 week. Bathing/ [employer] wounds with antibacterial soap and water. May shower; gently cleanse wound with antibacterial soap, rinse and pat dry prior to dressing wounds Anesthetic (Use 'Patient Medications' Section for Anesthetic Order Entry) Lidocaine applied to wound bed Edema Control - Lymphedema / Segmental Compressive Device / Other Elevate, Exercise Daily and A void  Standing for Long Periods of Time. Elevate legs to the level of the heart  and pump ankles as often as possible Elevate leg(s) parallel to the floor when sitting. Wound Treatment Wound #2 - Lower Leg Wound Laterality: Left, Lateral Cleanser: Soap and Water 3 x Per Week/15 Days Wanda, [name] ([id number]) [phone].pdf Page 3 of 6 Discharge Instructions: Gently cleanse wound with antibacterial soap, rinse and pat dry prior to dressing wounds Prim Dressing: Hydrofera Blue Ready Transfer Foam, 2.5x2.5 (in/in) 3 x Per Week/15 Days ary Discharge Instructions: Apply Hydrofera Blue Ready to wound bed as directed Secondary Dressing: [place] Dressing, 4x4 (in/in) (Generic) 3 x Per Week/15 Days elfa Discharge Instructions: Apply over dressing to secure in place. Compression Stockings: Circaid Juxta Lite Compression Wrap Left Leg Compression Amount: 30-40 mmHG Discharge Instructions: Apply Circaid Juxta Lite Compression Wrap as directed Electronic Signature(s) Signed: 09/24/2022 6:36:12 PM By: Wanda Keeler PA-C Entered By: [name] on 09/24/2022 18:36:12 -------------------------------------------------------------------------------- Problem List Details Patient Name: Date of Service: [facility] [place], A Dreama Saa. 09/23/2022 3:30 PM Medical Record Number: [id number] Patient Account Number: [id number] Date of Birth/Sex: Treating RN: [birth year] (82 y.o. 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/06/2023 No Yes I10 Essential (primary)  hypertension 07/12/2022 No Yes I50.42 Chronic combined systolic (congestive) and diastolic (congestive) heart failure 07/12/2022 No Yes N18.30 Chronic kidney disease, stage 3 unspecified 07/12/2022 No Yes Inactive Problems Resolved Problems Electronic Signature(s) Signed: 09/23/2022 3:32:35 PM By: Wanda Keeler PA-C Entered By: [name] on 09/23/2022 15:32:34 Progress Note Details -------------------------------------------------------------------------------- [name] ([id number] Page 4 of 6 Patient Name: Date of Service: [place] 09/23/2022 3:30 PM Medical Record Number: [id number] Patient Account Number: [id number] Date of Birth/Sex: Treating RN: 1941-02-13 (82 y.o. [name] Primary Care Provider: Tracie [name] Other Clinician: Referring Provider: Treating Provider/Extender: [name] Weeks in Treatment: 10 Subjective Chief Complaint Information obtained from Patient Left LE Ulcers History of Present Illness (HPI) 07-12-2022 upon evaluation today patient presents for initial inspection concerning issues she has been having with wounds on her left lower extremity. She thinks she may have hit the lateral portion of her leg on the dishwasher door but the other she thinks may have been a tape injury. It is more of a blister type situation. With that being said she tells me that she is having some discomfort although is not terrible at this point. Fortunately I do not see any signs of infection locally or systemically at this time which is great news. No fevers, chills, nausea, vomiting, or diarrhea. Patient does have a history of diabetes mellitus type 2, lymphedema for which she is not really able to wear compression socks that she tells me she cannot get them on. She also has hypertension, congestive heart failure, and chronic kidney disease stage III. 07-26-2022 upon evaluation today patient  appears to be doing well currently in regard to her wound. The compression wrapping does seem to be helping I am good I perform some debridement in regard to the left lateral leg but the other wound is actually looking quite well. I think we are headed in the right direction. Of note she did put her compression wrap down  she was stating that this was getting too tight. 08-02-2022 upon evaluation today patient appears to be doing well currently in regard to her wound. She is actually showing signs of improvement in regard to both and I am extremely pleased with where we stand today. I do not see any evidence of active infection locally nor systemically which is great news. No fevers, chills, nausea, vomiting, or diarrhea. 08-09-2022 upon evaluation today patient appears to be doing well currently in regard to her wound. She has been tolerating the dressing changes without complication and overall 1 wound is healed the other is doing much better. Fortunately I do not see any signs of active infection locally or systemically which is great news. 08-16-2022 upon evaluation today patient appears to be doing well currently in regard to her wound although this is still showing some signs of being slow to heal. She keeps pushing the compression wrap down she tells me it gets too tight at the top we try to make some adjustments but is just not working for her. With that being said I think that we are probably going to switch to Tubigrip since she is not able to keep this up on more concerned about it causing additional damage to her leg and obviously is not helping it heal either if that is what is happening. 08-23-2022 upon evaluation today patient appears to be doing well currently in regard to her wound. This is showing signs of some improvement but fortunately though it is doing better and unfortunately is still showing a lot of swelling in regard to her leg we have been having a hard time getting something on  what she is able to keep on she is having a lot of discomfort a lot of ways. Fortunately I do not see any signs of active infection locally nor systemically which is great news. No fevers, chills, nausea, vomiting, or diarrhea. 08-30-2022 upon evaluation today patient appears to be doing well with regard to her wound. This is showing signs of being much smaller is doing quite well and overall I am extremely pleased with where we stand I do not see any signs of infection locally nor systemically at this point. 3/11; patient presents for follow-up. She has been using Hydrofera Blue along with her juxta light compression daily. She has no issues or complaints today. 09-23-2022 upon evaluation today patient appears to be doing well currently in regard to her wound which is actually measuring significantly smaller. I am actually very pleased with where we stand I think we are headed in the right direction and she is extremely happy to hear this as well. Objective Constitutional Obese and well-hydrated in no acute distress. Vitals Time Taken: 3:17 PM, Height: 69 in, Weight: 250 lbs, BMI: 36.9, Temperature: 97.9 F, Pulse: 61 bpm, Respiratory Rate: 16 breaths/min, Blood Pressure: 143/64 mmHg. Respiratory normal breathing without difficulty. Psychiatric this patient is able to make decisions and demonstrates good insight into disease process. Alert and Oriented x 3. pleasant and cooperative. General Notes: Upon inspection patient's wound bed showed signs of good granulation epithelization no need for sharp debridement and overall I am extremely pleased where things stand currently I do not think that she is showing any signs of worsening overall which is great news. with Integumentary (Hair, Skin) Wound #2 status is Open. Original cause of wound was Gradually Appeared. The date acquired was: 05/01/2022. The wound has been in treatment 10 weeks. The wound is located on the Left,Lateral Lower Leg.  The wound  measures 0.5cm length x 0.4cm width x 0.2cm depth; 0.157cm^2 area and 0.031cm^3 volume. There is Fat Layer (Subcutaneous Tissue) exposed. There is a medium amount of serosanguineous drainage noted. There is medium (34-66%) granulation within the wound bed. There is a medium (34-66%) amount of necrotic tissue within the wound bed including [place]. Wanda, [name] ([id number]) [id number] Page 5 of 6 Assessment Active Problems ICD-10 Type 2 diabetes mellitus with other skin ulcer Lymphedema, not elsewhere classified Non-pressure chronic ulcer of other part of left lower leg with fat layer exposed Essential (primary) hypertension Chronic combined systolic (congestive) and diastolic (congestive) heart failure Chronic kidney disease, stage 3 unspecified Plan Follow-up Appointments: Return Appointment in 1 week. Bathing/ Shower/ Hygiene: Wash wounds with antibacterial soap and water. May shower; gently cleanse wound with antibacterial soap, rinse and pat dry prior to dressing wounds Anesthetic (Use 'Patient Medications' Section for Anesthetic Order Entry): Lidocaine applied to wound bed Edema Control - Lymphedema / Segmental Compressive Device / Other: Elevate, Exercise Daily and Avoid Standing for Long Periods of Time. Elevate legs to the level of the heart and pump ankles as often as possible Elevate leg(s) parallel to the floor when sitting. WOUND #2: - Lower Leg Wound Laterality: Left, Lateral Cleanser: Soap and Water 3 x Per Week/15 Days Discharge Instructions: Gently cleanse wound with antibacterial soap, rinse and pat dry prior to dressing wounds Prim Dressing: Hydrofera Blue Ready Transfer Foam, 2.5x2.5 (in/in) 3 x Per Week/15 Days ary Discharge Instructions: Apply Hydrofera Blue Ready to wound bed as directed Secondary Dressing: [place] Dressing, 4x4 (in/in) (Generic) 3 x Per Week/15 Days elfa Discharge Instructions: Apply over  dressing to secure in place. Com pression Stockings: Circaid Juxta Lite Compression Wrap Compression Amount: 30-40 mmHg (left) Discharge Instructions: Apply Circaid Juxta Lite Compression Wrap as directed 1. I would recommend that we have her continue with the wound care measures as before she is making such good improvement I am not can make any changes whatsoever this includes however the use of the Hydrofera Blue with a T island dressing which is doing quite well. elfa 2. I am also can recommend she continue with her juxta lite which is doing a great job keeping edema under control. We will see patient back for reevaluation in 1 week here in the clinic. If anything worsens or changes patient will contact our office for additional recommendations. Electronic Signature(s) Signed: 09/24/2022 6:36:43 PM By: Wanda Keeler PA-C Entered By: [name] on 09/24/2022 18:36:43 -------------------------------------------------------------------------------- SuperBill Details Patient Name: Date of Service: [place], A Dreama Saa 09/23/2022 Medical Record Number: [id number] Patient Account Number: [id number] Date of Birth/Sex: Treating RN: 1941/01/11 (82 y.o. [name] Primary Care Provider: Tracie [name] Other Clinician: Referring Provider: Treating Provider/Extender: [name] Weeks in Treatment: 10 Diagnosis Coding ICD-10 Codes Code Description E11.622 Type 2 diabetes mellitus with other skin ulcer I89.0 Lymphedema, not elsewhere classified L97.822 Non-pressure chronic ulcer of other part of left lower leg with fat layer exposed I10 Essential (primary) hypertension I50.42 Chronic combined systolic (congestive) and diastolic (congestive) heart failure N18.30 Chronic kidney disease, stage 3 unspecified Ardolino, Solymar P ([id number] Page 6 of 6 Physician Procedures : CPT4 Code Description Modifier [id number] - WC PHYS  LEVEL 3 - EST PT ICD-10 Diagnosis Description E11.622 Type 2 diabetes mellitus with other skin ulcer I89.0 Lymphedema, not elsewhere classified L97.822 Non-pressure chronic ulcer of other part of  left lower leg with fat layer  exposed [place] (primary) hypertension Quantity: 1 Electronic Signature(s) Signed: 09/24/2022 6:37:38 PM By: Wanda Keeler PA-C Entered By: [name] on 09/24/2022 18:37:37

## 2022-09-30 ENCOUNTER — Encounter: Payer: Medicare Other | Attending: Physician Assistant | Admitting: Physician Assistant

## 2022-09-30 DIAGNOSIS — I130 Hypertensive heart and chronic kidney disease with heart failure and stage 1 through stage 4 chronic kidney disease, or unspecified chronic kidney disease: Secondary | ICD-10-CM | POA: Insufficient documentation

## 2022-09-30 DIAGNOSIS — L97822 Non-pressure chronic ulcer of other part of left lower leg with fat layer exposed: Secondary | ICD-10-CM | POA: Insufficient documentation

## 2022-09-30 DIAGNOSIS — I5042 Chronic combined systolic (congestive) and diastolic (congestive) heart failure: Secondary | ICD-10-CM | POA: Diagnosis not present

## 2022-09-30 DIAGNOSIS — N183 Chronic kidney disease, stage 3 unspecified: Secondary | ICD-10-CM | POA: Insufficient documentation

## 2022-09-30 DIAGNOSIS — I890 Lymphedema, not elsewhere classified: Secondary | ICD-10-CM | POA: Diagnosis not present

## 2022-09-30 DIAGNOSIS — E1122 Type 2 diabetes mellitus with diabetic chronic kidney disease: Secondary | ICD-10-CM | POA: Insufficient documentation

## 2022-09-30 DIAGNOSIS — E11622 Type 2 diabetes mellitus with other skin ulcer: Secondary | ICD-10-CM | POA: Insufficient documentation

## 2022-09-30 NOTE — Progress Notes (Addendum)
Wanda, [name] ([id number]) [id number] Page 1 of 6 Visit Report for 09/30/2022 Chief Complaint Document Details Patient Name: Date of Service: [name] 09/30/2022 1:45 PM Medical Record Number: [id number] Patient Account Number: [id number] Date of Birth/Sex: Treating RN: 19-Jun-1941 (82 y.o. [name] Primary Care Provider: Tracie [name] Other Clinician: Massie [name] Referring Provider: Treating Provider/Extender: [name]: 11 Information Obtained from: Patient Chief Complaint Left LE Ulcers Electronic Signature(s) Signed: 09/30/2022 1:53:29 PM By: Worthy Keeler PA-C Entered By: Worthy Keeler on 09/30/2022 13:53:29 -------------------------------------------------------------------------------- HPI Details Patient Name: Date of Service: [name], Wanda Dreama Saa. 09/30/2022 1:45 PM Medical Record Number: [id number] Patient Account Number: [id number] Date of Birth/Sex: Treating RN: 1940/10/30 (82 y.o. [name] Primary Care Provider: Tracie [name] Other Clinician: Massie [name] Referring Provider: Treating Provider/Extender: [name]: 11 History of Present Illness HPI Description: 07-12-2022 upon evaluation today patient presents for initial inspection concerning issues she has been having with wounds on her left lower extremity. She thinks she may have hit the lateral portion of her leg on the dishwasher door but the other she thinks may have been Wanda tape injury. It is more of Wanda blister type situation. With that being said she tells me that she is having some discomfort although is not terrible at this point. Fortunately I do not see any signs of infection locally or systemically at this time which is great news. No fevers, chills, nausea, vomiting, or diarrhea. Patient does have Wanda history of diabetes mellitus type 2, lymphedema for which she is not  really able to wear compression socks that she tells me she cannot get them on. She also has hypertension, congestive heart failure, and chronic kidney disease stage III. 07-26-2022 upon evaluation today patient appears to be doing well currently [name]. The compression wrapping does seem to be helping I am good I perform some debridement [name] but the other wound is actually looking quite well. I think we are headed [name] the right direction. Of note she did put her compression wrap down she was stating that this was getting too tight. 08-02-2022 upon evaluation today patient appears to be doing well currently [name]. She is actually showing signs of improvement [name] regard to both and I am extremely pleased with where we stand today. I do not see any evidence of active infection locally nor systemically which is great news. No fevers, chills, nausea, vomiting, or diarrhea. 08-09-2022 upon evaluation today patient appears to be doing well currently [name]. She has been tolerating the dressing changes without complication and overall 1 wound is healed the other is doing much better. Fortunately I do not see any signs of active infection locally or systemically which is great news. 08-16-2022 upon evaluation today patient appears to be doing well currently [name] although this is still showing some signs of being slow to heal. She keeps pushing the compression wrap down she tells me it gets too tight at the top we try to make some adjustments but is just not working for her. With that being said I think that we are probably going to switch to Tubigrip since she is not able to keep this up on more concerned about it causing additional damage to her leg and obviously is not helping it heal either if that is  what is happening. 08-23-2022 upon evaluation today patient appears to be doing well currently [name].  This is showing signs of some improvement but fortunately though it is doing better and unfortunately is still showing Wanda lot of swelling [name] regard to her leg we have been having Wanda hard time getting something on what she is able to keep on she is having Wanda lot of discomfort Wanda lot of ways. Fortunately I do not see any signs of active infection locally nor systemically which is great news. No fevers, chills, nausea, vomiting, or diarrhea. 08-30-2022 upon evaluation today patient appears to be doing well with regard to her wound. This is showing signs of being much smaller is doing quite well and overall I am extremely pleased with where we stand I do not see any signs of infection locally nor systemically at this point. 3/11; patient presents for follow-up. She has been using Hydrofera Blue along with her juxta light compression daily. She has no issues or complaints today. 09-23-2022 upon evaluation today patient appears to be doing well currently [name]. I am actually very pleased with where we stand I think we are headed [name] the right direction and she is extremely happy to hear this as well. 09-30-2022 upon evaluation today patient appears to be doing well currently [name]. She has been tolerating the dressing changes without complication the wound is much smaller no debridement needed but she does seem to be making some pretty good progress here. Fortunately I do not see any signs of active infection locally nor systemically which is great news. No fevers, chills, nausea, vomiting, or diarrhea. [name], [name] ([id number]) [id number] Page 2 of 6 Electronic Signature(s) Signed: 09/30/2022 2:59:30 PM By: Worthy Keeler PA-C Entered By: Worthy Keeler on 09/30/2022 14:59:30 -------------------------------------------------------------------------------- Physical Exam Details Patient Name: Date of  Service: [name] 09/30/2022 1:45 PM Medical Record Number: [id number] Patient Account Number: [id number] Date of Birth/Sex: Treating RN: [birth year] (82 y.o. [name] Primary Care Provider: Tracie [name] Other Clinician: Massie [name] Referring Provider: Treating Provider/Extender: [name]: 64 Constitutional Well-nourished and well-hydrated [name] no acute distress. Respiratory normal breathing without difficulty. Psychiatric this patient is able to make decisions and demonstrates good insight into disease process. Alert and Oriented x 3. pleasant and cooperative. Notes Upon inspection patient's wound bed actually showed signs of excellent granulation and epithelization at this point. Fortunately I do not see any evidence of active infection locally nor systemically which is great news and overall I think that she is headed [name] the right direction. Electronic Signature(s) Signed: 09/30/2022 2:59:43 PM By: Worthy Keeler PA-C Entered By: Worthy Keeler on 09/30/2022 14:59:43 -------------------------------------------------------------------------------- Physician Orders Details Patient Name: Date of Service: [place], Wanda Dreama Saa. 09/30/2022 1:45 PM Medical Record Number: [id number] Patient Account Number: [id number] Date of Birth/Sex: Treating RN: 02/07/1941 (82 y.o. [name] Primary Care Provider: Tracie [name] Other Clinician: Massie [name] Referring Provider: Treating Provider/Extender: [name]: 76 Verbal / Phone Orders: Yes Clinician: Rosalio [name] Read Back and Verified: Yes Diagnosis Coding ICD-10 Coding Code Description E11.622 Type 2 diabetes mellitus with other skin ulcer I89.0 Lymphedema, not elsewhere classified L97.822 Non-pressure chronic ulcer of other part of left lower leg with fat layer exposed I10 Essential (primary) hypertension I50.42 Chronic combined  systolic (congestive) and diastolic (congestive)  heart failure N18.30 Chronic kidney disease, stage 3 unspecified Follow-up Appointments Return Appointment [name] 1 week. Bathing/ [employer] wounds with antibacterial soap and water. May shower; gently cleanse wound with antibacterial soap, rinse and pat dry prior to dressing wounds Anesthetic (Use 'Patient Medications' Section for Anesthetic Order Entry) Lidocaine applied to wound bed Edema Control - Lymphedema / Segmental Compressive Device / Other Elevate, Exercise Daily and Wanda void Standing for Long Periods of Time. Elevate legs to the level of the heart and pump ankles as often as possible Elevate leg(s) parallel to the floor when sitting. [name], [name] ([id number]) [id number] Page 3 of 6 Wound Treatment Wound #2 - Lower Leg Wound Laterality: Left, Lateral Cleanser: Soap and Water 3 x Per Week/15 Days Discharge Instructions: Gently cleanse wound with antibacterial soap, rinse and pat dry prior to dressing wounds Prim Dressing: Hydrofera Blue Ready Transfer Foam, 2.5x2.5 ([name]/[name]) 3 x Per Week/15 Days ary Discharge Instructions: Apply Hydrofera Blue Ready to wound bed as directed Secondary Dressing: T Adhesive [employer], 4x4 ([name]/[name]) (Generic) 3 x Per Week/15 Days elfa Discharge Instructions: Apply over dressing to secure [name] place. Compression Stockings: Circaid Juxta Lite Compression Wrap Left Leg Compression Amount: 30-40 mmHG Discharge Instructions: Apply Circaid Juxta Lite Compression Wrap as directed Electronic Signature(s) Signed: 10/01/2022 4:53:44 PM By: [name] Signed: 10/04/2022 8:47:01 AM By: Wanda Kelp PA-C Entered By: [name] on 09/30/2022 14:20:32 -------------------------------------------------------------------------------- Problem List Details Patient Name: Date of Service: [name], Wanda Elisha [place]. 09/30/2022 1:45 PM Medical Record Number: [id number] Patient  Account Number: [id number] Date of Birth/Sex: Treating RN: [birth year] (82 y.o. 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/06/2023 No Yes I10 Essential (primary) hypertension 07/12/2022 No Yes I50.42 Chronic combined systolic (congestive) and diastolic (congestive) heart failure 07/12/2022 No Yes N18.30 Chronic kidney disease, stage 3 unspecified 07/12/2022 No Yes Inactive Problems Resolved Problems Electronic Signature(s) Signed: 09/30/2022 1:53:21 PM By: Wanda Kelp PA-C Entered By: [name] on 09/30/2022 13:53:21 [name], [name] ([id number]) [id number] Page 4 of 6 -------------------------------------------------------------------------------- Progress Note Details Patient Name: Date of Service: [place], [name] 09/30/2022 1:45 PM Medical Record Number: [id number] Patient Account Number: [id number] Date of Birth/Sex: Treating RN: 1940/08/21 (82 y.o. [name] Primary Care Provider: Barbette Reichmann Other Clinician: Betha [name] Referring Provider: Treating Provider/Extender: [name]: 11 Subjective Chief Complaint Information obtained from Patient Left LE Ulcers History of Present Illness (HPI) 07-12-2022 upon evaluation today patient presents for initial inspection concerning issues she has been having with wounds on her left lower extremity. She thinks she may have hit the lateral portion of her leg on the dishwasher door but the other she thinks may  have been Wanda tape injury. It is more of Wanda blister type situation. With that being said she tells me that she is having some discomfort although is not terrible at this point. Fortunately I do not see any signs of infection locally or systemically at this time which is great news. No fevers, chills, nausea, vomiting, or diarrhea. Patient does have Wanda history of diabetes mellitus type 2, lymphedema for which  she is not really able to wear compression socks that she tells me she cannot get them on. She also has hypertension, congestive heart failure, and chronic kidney disease stage III. 07-26-2022 upon evaluation today patient appears to be doing well currently [name]. The compression wrapping does seem to be helping I am good I perform some debridement [name] but the other wound is actually looking quite well. I think we are headed [name] the right direction. Of note she did put her compression wrap down she was stating that this was getting too tight. 08-02-2022 upon evaluation today patient appears to be doing well currently [name]. She is actually showing signs of improvement [name] regard to both and I am extremely pleased with where we stand today. I do not see any evidence of active infection locally nor systemically which is great news. No fevers, chills, nausea, vomiting, or diarrhea. 08-09-2022 upon evaluation today patient appears to be doing well currently [name]. She has been tolerating the dressing changes without complication and overall 1 wound is healed the other is doing much better. Fortunately I do not see any signs of active infection locally or systemically which is great news. 08-16-2022 upon evaluation today patient appears to be doing well currently [name] although this is still showing some signs of being slow to heal. She keeps pushing the compression wrap down she tells me it gets too tight at the top  we try to make some adjustments but is just not working for her. With that being said I think that we are probably going to switch to Tubigrip since she is not able to keep this up on more concerned about it causing additional damage to her leg and obviously is not helping it heal either if that is what is happening. 08-23-2022 upon evaluation today patient appears to be doing well currently [name]. This is showing signs of some improvement but fortunately though it is doing better and unfortunately is still showing Wanda lot of swelling [name] regard to her leg we have been having Wanda hard time getting something on what she is able to keep on she is having Wanda lot of discomfort Wanda lot of ways. Fortunately I do not see any signs of active infection locally nor systemically which is great news. No fevers, chills, nausea, vomiting, or diarrhea. 08-30-2022 upon evaluation today patient appears to be doing well with regard to her wound. This is showing signs of being much smaller is doing quite well and overall I am extremely pleased with where we stand I do not see any signs of infection locally nor systemically at this point. 3/11; patient presents for follow-up. She has been using Hydrofera Blue along with her juxta light compression daily. She has no issues or complaints today. 09-23-2022 upon evaluation today patient appears to be doing well currently [name]. I am actually very pleased with where we stand I think we are headed [name] the right direction and she is extremely happy to hear this as well. 09-30-2022 upon evaluation today patient appears to be doing well currently [name]. She has been tolerating the dressing changes without complication the wound is much smaller no debridement needed but she does seem to be making some pretty good progress here. Fortunately I do not see any  signs of active infection locally nor  systemically which is great news. No fevers, chills, nausea, vomiting, or diarrhea. Objective Constitutional Well-nourished and well-hydrated [name] no acute distress. Vitals Time Taken: 1:47 PM, Height: 69 [name], Weight: 250 lbs, BMI: 36.9, Temperature: 98.1 F, Pulse: 61 bpm, Respiratory Rate: 18 breaths/min, Blood Pressure: 136/72 mmHg. Respiratory normal breathing without difficulty. Psychiatric this patient is able to make decisions and demonstrates good insight into disease process. Alert and Oriented x 3. pleasant and cooperative. General Notes: Upon inspection patient's wound bed actually showed signs of excellent granulation and epithelization at this point. Fortunately I do not see any evidence of active infection locally nor systemically which is great news and overall I think that she is headed [name] the right direction. [name], BORRELLI ([id number]) [id number] Page 5 of 6 Integumentary (Hair, Skin) Wound #2 status is Open. Original cause of wound was Gradually Appeared. The date acquired was: 05/01/2022. The wound has been [name] 11 weeks. The wound is located on the Left,Lateral Lower Leg. The wound measures 0.3cm length x 0.4cm width x 0.1cm depth; 0.094cm^2 area and 0.009cm^3 volume. There is Fat Layer (Subcutaneous Tissue) exposed. There is Wanda medium amount of serosanguineous drainage noted. There is medium (34-66%) granulation within the wound bed. There is Wanda medium (34-66%) amount of necrotic tissue within the wound bed including Adherent Slough. Assessment Active Problems ICD-10 Type 2 diabetes mellitus with other skin ulcer Lymphedema, not elsewhere classified Non-pressure chronic ulcer of other part of left lower leg with fat layer exposed Essential (primary) hypertension Chronic combined systolic (congestive) and diastolic (congestive) heart failure Chronic kidney disease, stage 3 unspecified Plan Follow-up Appointments: Return Appointment [name]  1 week. Bathing/ Shower/ Hygiene: Wash wounds with antibacterial soap and water. May shower; gently cleanse wound with antibacterial soap, rinse and pat dry prior to dressing wounds Anesthetic (Use 'Patient Medications' Section for Anesthetic Order Entry): Lidocaine applied to wound bed Edema Control - Lymphedema / Segmental Compressive Device / Other: Elevate, Exercise Daily and Avoid Standing for Long Periods of Time. Elevate legs to the level of the heart and pump ankles as often as possible Elevate leg(s) parallel to the floor when sitting. WOUND #2: - Lower Leg Wound Laterality: Left, Lateral Cleanser: Soap and Water 3 x Per Week/15 Days Discharge Instructions: Gently cleanse wound with antibacterial soap, rinse and pat dry prior to dressing wounds Prim Dressing: Hydrofera Blue Ready Transfer Foam, 2.5x2.5 ([name]/[name]) 3 x Per Week/15 Days ary Discharge Instructions: Apply Hydrofera Blue Ready to wound bed as directed Secondary Dressing: T Adhesive [employer], 4x4 ([name]/[name]) (Generic) 3 x Per Week/15 Days elfa Discharge Instructions: Apply over dressing to secure [name] place. Com pression Stockings: Circaid Juxta Lite Compression Wrap Compression Amount: 30-40 mmHg (left) Discharge Instructions: Apply Circaid Juxta Lite Compression Wrap as directed 1. I am recommend that we have the patient continue to monitor for any signs of infection or worsening. Obviously if anything changes she knows to contact the office and let me know. 2. I am going to suggest as well that she should continue to elevate her legs is much as she can she was unable to tolerated the compression wraps which is why this taken Wanda little bit longer but she should continue with the juxta lite wrap she is also using the Hydrofera Blue followed by the [employer] dressing. elfa We will see patient back for reevaluation [name] 1 week here [name] the clinic. If anything worsens or changes patient will contact our office for  additional recommendations. Electronic Signature(s) Signed: 09/30/2022 3:00:15 PM By: Wanda Kelp PA-C Entered By: [name] on 09/30/2022 15:00:14 -------------------------------------------------------------------------------- SuperBill Details Patient Name: Date of Service: [facility] [name], Wanda Elisha [place]. 09/30/2022 Medical Record Number: [id number] Patient Account Number: [id number] Date of Birth/Sex: Treating RN: April 28, 1941 (82 y.o. [name] Primary Care Provider: Barbette Reichmann Other Clinician: Betha [name] Referring Provider: Treating Provider/Extender: [name]: 11 Diagnosis Coding ICD-10 Codes Code Description E11.622 Type 2 diabetes mellitus with other skin ulcer [place], Shalanda P ([id number]) [id number] Page 6 of 6 I89.0 Lymphedema, not elsewhere classified L97.822 Non-pressure chronic ulcer of other part of left lower leg with fat layer exposed I10 Essential (primary) hypertension I50.42 Chronic combined systolic (congestive) and diastolic (congestive) heart failure N18.30 Chronic kidney disease, stage 3 unspecified Facility Procedures : CPT4 Code: [id number] Description: 32951 - WOUND CARE VISIT-LEV 2 EST PT Modifier: Quantity: 1 Physician Procedures : CPT4 Code Description Modifier [id number] - WC PHYS LEVEL 3 - EST PT ICD-10 Diagnosis Description E11.622 Type 2 diabetes mellitus with other skin ulcer I89.0 Lymphedema, not elsewhere classified L97.822 Non-pressure chronic ulcer of other part of  left lower leg with fat layer exposed I10 Essential (primary) hypertension Quantity: 1 Electronic Signature(s) Signed: 09/30/2022 3:00:51 PM By: Wanda Kelp PA-C Entered By: [name] on 09/30/2022 15:00:51

## 2022-10-02 ENCOUNTER — Other Ambulatory Visit: Payer: Medicare Other

## 2022-10-02 NOTE — Progress Notes (Signed)
[name] ([id number]) [id number] Page 1 of 7 Visit Report for 09/30/2022 Arrival Information Details Patient Name: Date of Service: [name], [name] 09/30/2022 1:45 PM Medical Record Number: [id number] Patient Account Number: [id number] Date of Birth/Sex: Treating RN: Aug 18, 1940 (82 y.o. [name] Primary Care [name]: [name] Other Clinician: Massie [name] Referring [name]: Treating [name]: [name] [name]: 11 Visit Information History Since Last Visit All ordered tests and consults were completed: No Patient Arrived: Ambulatory Added or deleted any medications: No Arrival Time: 13:46 Any new allergies or adverse reactions: No Transfer Assistance: None Had Wanda fall or experienced change [name] No Patient Requires Transmission-Based Precautions: No activities of daily living that may affect Patient Has Alerts: Yes risk of falls: Patient Alerts: 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 08/14/21 Signs or symptoms of abuse/neglect since last visito No R 1.04 TBI .96 08/14/21 Hospitalized since last visit: No Implantable device outside of the clinic excluding No cellular tissue based products placed [name] since last visit: Has Dressing [name] Place as Prescribed: Yes Pain Present Now: No Electronic Signature(s) Signed: 10/01/2022 4:53:44 PM By: [name] Entered By: [name] on 09/30/2022 13:47:05 -------------------------------------------------------------------------------- Clinic Level of Care Assessment Details Patient Name: Date of Service: [name] 09/30/2022 1:45 PM Medical Record Number: [id number] Patient Account Number: [id number] Date of Birth/Sex: Treating RN: 08-21-1940 (82 y.o. [name] Primary Care [name]: [name] Other Clinician: Massie [name] Referring [name]: Treating [name]: [name] [name]: 11 Clinic  Level of Care Assessment Items TOOL 4 Quantity Score []  - 0 Use when only an EandM is performed on FOLLOW-UP visit ASSESSMENTS - Nursing Assessment / Reassessment X- 1 10 Reassessment of Co-morbidities (includes updates [name] patient status) X- 1 5 Reassessment of Adherence to Treatment Plan ASSESSMENTS - Wound and Skin Wanda ssessment / Reassessment X - Simple Wound Assessment / Reassessment - one wound 1 5 []  - 0 Complex Wound Assessment / Reassessment - multiple wounds []  - 0 Dermatologic / Skin Assessment (not related to wound area) ASSESSMENTS - Focused Assessment []  - 0 Circumferential Edema Measurements - multi extremities []  - 0 Nutritional Assessment / Counseling / Intervention []  - 0 Lower Extremity Assessment (monofilament, tuning fork, pulses) []  - 0 Peripheral Arterial Disease Assessment (using hand held doppler) ASSESSMENTS - Ostomy and/or Continence Assessment and Care []  - 0 Incontinence Assessment and Management []  - 0 Ostomy Care Assessment and Management (repouching, etc.) PROCESS - Coordination of Care [name] ([id number] Page 2 of 7 X- 1 15 Simple Patient / Family Education for ongoing care []  - 0 Complex (extensive) Patient / Family Education for ongoing care []  - 0 Staff obtains Programmer, systems, Records, T Results / Process Orders est []  - 0 Staff telephones HHA, Nursing Homes / Clarify orders / etc []  - 0 Routine Transfer to another Facility (non-emergent condition) []  - 0 Routine Hospital Admission (non-emergent condition) []  - 0 New Admissions / Biomedical engineer / Ordering NPWT Apligraf, etc. , []  - 0 Emergency Hospital Admission (emergent condition) X- 1 10 Simple Discharge Coordination []  - 0 Complex (extensive) Discharge Coordination PROCESS - Special Needs []  - 0 Pediatric / Minor Patient Management []  - 0 Isolation Patient Management []  - 0 Hearing / Language / Visual special needs []  -  0 Assessment of Community assistance (transportation, D/C planning, etc.) []  - 0 Additional assistance / Altered mentation []  - 0 Support Surface(s) Assessment (bed, cushion, seat, etc.)  INTERVENTIONS - Wound Cleansing / Measurement X - Simple Wound Cleansing - one wound 1 5 []  - 0 Complex Wound Cleansing - multiple wounds X- 1 5 Wound Imaging (photographs - any number of wounds) []  - 0 Wound Tracing (instead of photographs) X- 1 5 Simple Wound Measurement - one wound []  - 0 Complex Wound Measurement - multiple wounds INTERVENTIONS - Wound Dressings X - Small Wound Dressing one or multiple wounds 1 10 []  - 0 Medium Wound Dressing one or multiple wounds []  - 0 Large Wound Dressing one or multiple wounds []  - 0 Application of Medications - topical []  - 0 Application of Medications - injection INTERVENTIONS - Miscellaneous []  - 0 External ear exam []  - 0 Specimen Collection (cultures, biopsies, blood, body fluids, etc.) []  - 0 Specimen(s) / Culture(s) sent or taken to Lab for analysis []  - 0 Patient Transfer (multiple staff / Civil Service fast streamer / Similar devices) []  - 0 Simple Staple / Suture removal (25 or less) []  - 0 Complex Staple / Suture removal (26 or more) []  - 0 Hypo / Hyperglycemic Management (close monitor of Blood Glucose) []  - 0 Ankle / Brachial Index (ABI) - do not check if billed separately X- 1 5 Vital Signs Has the patient been seen at the hospital within the last three years: Yes Total Score: 75 Level Of Care: New/Established - Level 2 Electronic Signature(s) Signed: 10/01/2022 4:53:44 PM By: [name], [name] ([id number] Page 3 of 7 Entered By: [name] on 09/30/2022 14:20:57 -------------------------------------------------------------------------------- Encounter Discharge Information Details Patient Name: Date of Service: [name] 09/30/2022 1:45 PM Medical Record Number:  [id number] Patient Account Number: [id number] Date of Birth/Sex: Treating RN: [birth year] (82 y.o. [name] Primary Care [name]: [name] Other Clinician: Massie [name] Referring [name]: Treating [name]: [name] [name]: 11 Encounter Discharge Information Items Discharge Condition: Stable Ambulatory Status: Ambulatory Discharge Destination: Home Transportation: Private Auto Accompanied By: self Schedule Follow-up Appointment: Yes Clinical Summary of Care: Electronic Signature(s) Signed: 10/01/2022 4:53:44 PM By: [name] Entered By: [name] on 09/30/2022 14:27:23 -------------------------------------------------------------------------------- Lower Extremity Assessment Details Patient Name: Date of Service: [name], [name] 09/30/2022 1:45 PM Medical Record Number: [id number] Patient Account Number: [id number] Date of Birth/Sex: Treating RN: [birth year] (82 y.o. [name] Primary Care Azaliah Carrero: [name] Other Clinician: Massie [name] Referring Layal Javid: Treating Madai Nuccio/Extender: [name] [name]: 11 Edema Assessment Assessed: [Left: Yes] [Right: No] Edema: [Left: Ye] [Right: s] Calf Left: Right: Point of Measurement: 35 cm From Medial Instep 41.7 cm Ankle Left: Right: Point of Measurement: 10 cm From Medial Instep 25 cm Vascular Assessment Pulses: Dorsalis Pedis Palpable: [Left:Yes] Electronic Signature(s) Signed: 09/30/2022 4:32:14 PM By: Rosalio Loud MSN RN CNS WTA Signed: 10/01/2022 4:53:44 PM By: [name] Entered By: [name] on 09/30/2022 13:54:56 -------------------------------------------------------------------------------- Multi Wound Chart Details Patient Name: Date of Service: [name], Wanda Dreama Saa. 09/30/2022 1:45 PM Medical Record Number: [id number] Patient Account Number: [id number] Date of Birth/Sex: Treating RN: 1940-11-20  (82 y.o. [name] Primary Care Elajah Kunsman: [name] Other Clinician: Massie [name] [place], [name] ([id number]) [id number] Page 4 of 7 Referring Koji Niehoff: Treating Yassir Enis/Extender: Nadara Mustard, Vishwanath [name] [name]: 11 Vital Signs Height([name]): 69 Pulse(bpm): 61 Weight(lbs): 250 Blood Pressure(mmHg): 136/72 Body Mass Index(BMI): 36.9 Temperature(F): 98.1 Respiratory Rate(breaths/min): 18 [2:Photos:] [N/Wanda:N/Wanda] Left, Lateral Lower Leg N/Wanda N/Wanda Wound Location: Gradually Appeared N/Wanda N/Wanda Wounding Event: Diabetic Wound/Ulcer  of the Lower N/Wanda N/Wanda Primary Etiology: Extremity Lymphedema, Congestive Heart N/Wanda N/Wanda Comorbid History: Failure, Hypertension, Type II Diabetes 05/01/2022 N/Wanda N/Wanda Date Acquired: 11 N/Wanda N/[name] of Treatment: Open N/Wanda N/Wanda Wound Status: No N/Wanda N/Wanda Wound Recurrence: 0.3x0.4x0.1 N/Wanda N/Wanda Measurements L x W x D (cm) 0.094 N/Wanda N/Wanda Wanda (cm) : rea 0.009 N/Wanda N/Wanda Volume (cm) : 96.70% N/Wanda N/Wanda % Reduction [name] Wanda rea: 98.40% N/Wanda N/Wanda % Reduction [name] Volume: Grade 2 N/Wanda N/Wanda Classification: Medium N/Wanda N/Wanda Exudate Wanda mount: Serosanguineous N/Wanda N/Wanda Exudate Type: red, brown N/Wanda N/Wanda Exudate Color: Medium (34-66%) N/Wanda N/Wanda Granulation Wanda mount: Medium (34-66%) N/Wanda N/Wanda Necrotic Wanda mount: Fat Layer (Subcutaneous Tissue): Yes N/Wanda N/Wanda Exposed Structures: Fascia: No Tendon: No Muscle: No Joint: No Bone: No Small (1-33%) N/Wanda N/Wanda Epithelialization: Treatment Notes Electronic Signature(s) Signed: 10/01/2022 4:53:44 PM By: [name] Entered By: [name] on 09/30/2022 13:55:05 -------------------------------------------------------------------------------- Multi-Disciplinary Care Plan Details Patient Name: Date of Service: [facility] [name], [name] 09/30/2022 1:45 PM Medical Record Number: [id number] Patient Account Number: [id number] Date of Birth/Sex: Treating RN: Oct 24, 1940 (82 y.o. [name] Primary Care Jackqueline Aquilar: [name] Other Clinician: Massie [name] Referring Sayde Lish: Treating Eudell Julian/Extender: [name] [name]: 11 Active Inactive Wound/Skin Impairment Nursing Diagnoses: [name], [name] ([id number]) [id number] Page 5 of 7 Knowledge deficit related to ulceration/compromised skin integrity Goals: Patient/caregiver will verbalize understanding of skin care regimen Date Initiated: 07/12/2022 Target Resolution Date: 08/12/2022 Goal Status: Active Ulcer/skin breakdown will have Wanda volume reduction of 30% by week 4 Date Initiated: 07/12/2022 Target Resolution Date: 08/12/2022 Goal Status: Active Ulcer/skin breakdown will have Wanda volume reduction of 50% by week 8 Date Initiated: 07/12/2022 Target Resolution Date: 09/10/2022 Goal Status: Active Ulcer/skin breakdown will have Wanda volume reduction of 80% by week 12 Date Initiated: 07/12/2022 Target Resolution Date: 10/11/2022 Goal Status: Active Ulcer/skin breakdown will heal within 14 [name] Date Initiated: 07/12/2022 Target Resolution Date: 11/10/2022 Goal Status: Active Interventions: Assess patient/caregiver ability to obtain necessary supplies Assess patient/caregiver ability to perform ulcer/skin care regimen upon admission and as needed Assess ulceration(s) every visit Notes: Electronic Signature(s) Signed: 09/30/2022 4:32:14 PM By: Rosalio Loud MSN RN CNS WTA Signed: 10/01/2022 4:53:44 PM By: [name] Entered By: [name] on 09/30/2022 14:21:08 -------------------------------------------------------------------------------- Pain Assessment Details Patient Name: Date of Service: [name], Wanda Dreama Saa. 09/30/2022 1:45 PM Medical Record Number: [id number] Patient Account Number: [id number] Date of Birth/Sex: Treating RN: 10-May-1941 (82 y.o. [name] Primary Care Elyse Prevo: [name] Other Clinician: Massie [name] Referring  Swati Granberry: Treating Daiveon Markman/Extender: [name] [name]: 11 Active Problems Location of Pain Severity and Description of Pain Patient Has Paino No Site Locations Pain Management and Medication Current Pain Management: Electronic Signature(s) Signed: 09/30/2022 4:32:14 PM By: Rosalio Loud MSN RN CNS WTA Signed: 10/01/2022 4:53:44 PM By: [name] Entered By: [name] on 09/30/2022 13:49:32 [place], [place] ([id number] Page 6 of 7 -------------------------------------------------------------------------------- Patient/Caregiver Education Details Patient Name: Date of Service: [place] 4/1/2024andnbsp1:45 PM Medical Record Number: [id number] Patient Account Number: [id number] Date of Birth/Gender: Treating RN: [birth year] (82 y.o. [name] Primary Care Physician: [name] Other Clinician: Massie [name] Referring Physician: Treating Physician/Extender: [name] [name]: 11 Education Assessment Education Provided To: Patient Education Topics Provided Wound/Skin Impairment: Handouts: Other: continue wound care as directed Methods: Explain/Verbal Responses: State content correctly Electronic Signature(s) Signed: 10/01/2022 4:53:44 PM By: [name] Entered  By: [name] on 09/30/2022 14:21:31 -------------------------------------------------------------------------------- Wound Assessment Details Patient Name: Date of Service: [name], [name] 09/30/2022 1:45 PM Medical Record Number: [id number] Patient Account Number: [id number] Date of Birth/Sex: Treating RN: 03-13-1941 (82 y.o. [name] Primary Care Kristene Liberati: [name] Other Clinician: Massie [name] Referring Rilie Glanz: Treating Jamilynn Whitacre/Extender: [name] [name]: 11 Wound Status Wound Number: 2 Primary Diabetic Wound/Ulcer of the  Lower Extremity Etiology: Wound Location: Left, Lateral Lower Leg Wound Status: Open Wounding Event: Gradually Appeared Comorbid Lymphedema, Congestive Heart Failure, Hypertension, Type Date Acquired: 05/01/2022 History: II Diabetes [name] Of Treatment: 11 Clustered Wound: No Photos Wound Measurements Length: (cm) 0.3 Width: (cm) 0.4 Depth: (cm) 0.1 Area: (cm) 0.094 Volume: (cm) 0.009 % Reduction [name] Area: 96.7% % Reduction [name] Volume: 98.4% Epithelialization: Small (1-33%) Wound Description Classification: Grade 2 [name], [name] ([id number]) Exudate Amount: Medium Exudate Type: Serosanguineous Exudate Color: red, brown Foul Odor After Cleansing: No [id number] Page 7 of 7 Slough/Fibrino Yes Wound Bed Granulation Amount: Medium (34-66%) Exposed Structure Necrotic Amount: Medium (34-66%) Fascia Exposed: No Necrotic Quality: Adherent Slough Fat Layer (Subcutaneous Tissue) Exposed: Yes Tendon Exposed: No Muscle Exposed: No Joint Exposed: No Bone Exposed: No Treatment Notes Wound #2 (Lower Leg) Wound Laterality: Left, Lateral Cleanser Soap and Water Discharge Instruction: Gently cleanse wound with antibacterial soap, rinse and pat dry prior to dressing wounds Peri-Wound Care Topical Primary Dressing Hydrofera Blue Ready Transfer Foam, 2.5x2.5 ([name]/[name]) Discharge Instruction: Apply Hydrofera Blue Ready to wound bed as directed Secondary Dressing T Adhesive [employer], 4x4 ([name]/[name]) elfa Discharge Instruction: Apply over dressing to secure [name] place. Secured With Compression Wrap Compression Stockings Circaid Juxta Lite Compression Wrap Quantity: 1 Left Leg Compression Amount: 30-40 mmHg Discharge Instruction: Apply Circaid Juxta Lite Compression Wrap as directed Add-Ons Electronic Signature(s) Signed: 09/30/2022 4:32:14 PM By: Rosalio Loud MSN RN CNS WTA Signed: 10/01/2022 4:53:44 PM By: [name] Entered By: [name] on  09/30/2022 13:53:55 -------------------------------------------------------------------------------- Vitals Details Patient Name: Date of Service: [name], Wanda Dreama Saa. 09/30/2022 1:45 PM Medical Record Number: [id number] Patient Account Number: [id number] Date of Birth/Sex: Treating RN: 11-Aug-1940 (82 y.o. [name] Primary Care Delon Revelo: [name] Other Clinician: Massie [name] Referring Caelen Higinbotham: Treating [place] Kreitzer/Extender: [name] [name]: 11 Vital Signs Time Taken: 13:47 Temperature (F): 98.1 Height ([name]): 69 Pulse (bpm): 61 Weight (lbs): 250 Respiratory Rate (breaths/min): 18 Body Mass Index (BMI): 36.9 Blood Pressure (mmHg): 136/72 Reference Range: 80 - 120 mg / dl Electronic Signature(s) Signed: 10/01/2022 4:53:44 PM By: [name] Entered By: [name] on 09/30/2022 13:49:27

## 2022-10-07 ENCOUNTER — Encounter: Payer: Medicare Other | Admitting: Physician Assistant

## 2022-10-07 DIAGNOSIS — E11622 Type 2 diabetes mellitus with other skin ulcer: Secondary | ICD-10-CM | POA: Diagnosis not present

## 2022-10-07 NOTE — Progress Notes (Addendum)
ALIANNA, WURSTER ([id number]) [id number] Page 1 of 5 Visit Report for 10/07/2022 Chief Complaint Document Details Patient Name: Date of Service: [place] 10/07/2022 12:30 PM Medical Record Number: [id number] Patient Account Number: [id number] Date of Birth/Sex: Treating RN: April 05, 1941 (82 y.o. [name] Primary Care Provider: Barbette Reichmann Other Clinician: Referring Provider: Treating Provider/Extender: Jearld Shines Weeks in Treatment: 12 Information Obtained from: Patient Chief Complaint Left LE Ulcers Electronic Signature(s) Signed: 10/07/2022 12:52:43 PM By: Lenda Kelp PA-C Entered By: Lenda Kelp on 10/07/2022 12:52:42 -------------------------------------------------------------------------------- HPI Details Patient Name: Date of Service: [place], A Elisha [place]. 10/07/2022 12:30 PM Medical Record Number: [id number] Patient Account Number: [id number] Date of Birth/Sex: Treating RN: [birth year] (82 y.o. [name] Primary Care Provider: Barbette Reichmann Other Clinician: Referring Provider: Treating Provider/Extender: Jearld Shines Weeks in Treatment: 12 History of Present Illness HPI Description: 07-12-2022 upon evaluation today patient presents for initial inspection concerning issues she has been having with wounds on her left lower extremity. She thinks she may have hit the lateral portion of her leg on the dishwasher door but the other she thinks may have been a tape injury. It is more of a blister type situation. With that being said she tells me that she is having some discomfort although is not terrible at this point. Fortunately I do not see any signs of infection locally or systemically at this time which is great news. No fevers, chills, nausea, vomiting, or diarrhea. Patient does have a history of diabetes mellitus type 2, lymphedema for which she is not really able to wear compression  socks that she tells me she cannot get them on. She also has hypertension, congestive heart failure, and chronic kidney disease stage III. 07-26-2022 upon evaluation today patient appears to be doing well currently in regard to her wound. The compression wrapping does seem to be helping I am good I perform some debridement in regard to the left lateral leg but the other wound is actually looking quite well. I think we are headed in the right direction. Of note she did put her compression wrap down she was stating that this was getting too tight. 08-02-2022 upon evaluation today patient appears to be doing well currently in regard to her wound. She is actually showing signs of improvement in regard to both and I am extremely pleased with where we stand today. I do not see any evidence of active infection locally nor systemically which is great news. No fevers, chills, nausea, vomiting, or diarrhea. 08-09-2022 upon evaluation today patient appears to be doing well currently in regard to her wound. She has been tolerating the dressing changes without complication and overall 1 wound is healed the other is doing much better. Fortunately I do not see any signs of active infection locally or systemically which is great news. 08-16-2022 upon evaluation today patient appears to be doing well currently in regard to her wound although this is still showing some signs of being slow to heal. She keeps pushing the compression wrap down she tells me it gets too tight at the top we try to make some adjustments but is just not working for her. With that being said I think that we are probably going to switch to Tubigrip since she is not able to keep this up on more concerned about it causing additional damage to her leg and obviously is not helping it heal either if that is what is happening. 08-23-2022  upon evaluation today patient appears to be doing well currently in regard to her wound. This is showing signs of some  improvement but fortunately though it is doing better and unfortunately is still showing a lot of swelling in regard to her leg we have been having a hard time getting something on what she is able to keep on she is having a lot of discomfort a lot of ways. Fortunately I do not see any signs of active infection locally nor systemically which is great news. No fevers, chills, nausea, vomiting, or diarrhea. 08-30-2022 upon evaluation today patient appears to be doing well with regard to her wound. This is showing signs of being much smaller is doing quite well and overall I am extremely pleased with where we stand I do not see any signs of infection locally nor systemically at this point. 3/11; patient presents for follow-up. She has been using Hydrofera Blue along with her juxta light compression daily. She has no issues or complaints today. 09-23-2022 upon evaluation today patient appears to be doing well currently in regard to her wound which is actually measuring significantly smaller. I am actually very pleased with where we stand I think we are headed in the right direction and she is extremely happy to hear this as well. 09-30-2022 upon evaluation today patient appears to be doing well currently in regard to her wound. She has been tolerating the dressing changes without complication the wound is much smaller no debridement needed but she does seem to be making some pretty good progress here. Fortunately I do not see any signs of active infection locally nor systemically which is great news. No fevers, chills, nausea, vomiting, or diarrhea. HILLERY, ZACHMAN ([id number]) [id number] Page 2 of 5 10-07-2022 upon evaluation today patient presents for initial inspection here in the clinic concerning her wound on the left lateral lower extremity. She is very close to complete closure but still having some trouble with this as such. Fortunately I do not see any evidence of active  infection locally nor systemically which is great news. No fevers, chills, nausea, vomiting, or diarrhea. Electronic Signature(s) Signed: 10/11/2022 7:35:09 AM By: Allen Derry PA-C Entered By: Allen Derry on 10/11/2022 07:35:08 -------------------------------------------------------------------------------- Physical Exam Details Patient Name: Date of Service: [place], [name]. 10/07/2022 12:30 PM Medical Record Number: [id number] Patient Account Number: [id number] Date of Birth/Sex: Treating RN: 1941/04/27 (82 y.o. [name] Primary Care Provider: Barbette Reichmann Other Clinician: Referring Provider: Treating Provider/Extender: Jearld Shines Weeks in Treatment: 12 Constitutional Well-nourished and well-hydrated in no acute distress. Respiratory normal breathing without difficulty. Psychiatric this patient is able to make decisions and demonstrates good insight into disease process. Alert and Oriented x 3. pleasant and cooperative. Notes Upon inspection patient's wound bed actually showed signs again of being almost completely healed we are close but not quite done with what we are seeing currently. I do believe that the patient should continue to utilize the dressing changes as before since she is doing so well with this I see no need to make any aggressive changes at this point. Electronic Signature(s) Signed: 10/11/2022 7:35:34 AM By: Allen Derry PA-C Entered By: Allen Derry on 10/11/2022 07:35:34 -------------------------------------------------------------------------------- Physician Orders Details Patient Name: Date of Service: [place], [name] 10/07/2022 12:30 PM Medical Record Number: [id number] Patient Account Number: [id number] Date of Birth/Sex: Treating RN: April 24, 1941 (82 y.o. [name] Primary Care Provider: Barbette Reichmann Other Clinician: Referring Provider: Treating Provider/Extender: Allen Derry  Hande, Vishwanath Weeks in Treatment:  12 Verbal / Phone Orders: No Diagnosis Coding ICD-10 Coding Code Description E11.622 Type 2 diabetes mellitus with other skin ulcer I89.0 Lymphedema, not elsewhere classified L97.822 Non-pressure chronic ulcer of other part of left lower leg with fat layer exposed I10 Essential (primary) hypertension I50.42 Chronic combined systolic (congestive) and diastolic (congestive) heart failure N18.30 Chronic kidney disease, stage 3 unspecified Follow-up Appointments Return Appointment in 1 week. Bathing/ [employer] wounds with antibacterial soap and water. May shower; gently cleanse wound with antibacterial soap, rinse and pat dry prior to dressing wounds Anesthetic (Use 'Patient Medications' Section for Anesthetic Order Entry) Lidocaine applied to wound bed [facility], [place] P ([id number]) [id number] Page 3 of 5 Edema Control - Lymphedema / Segmental Compressive Device / Other Elevate, Exercise Daily and A void Standing for Long Periods of Time. Elevate legs to the level of the heart and pump ankles as often as possible Elevate leg(s) parallel to the floor when sitting. Wound Treatment Wound #2 - Lower Leg Wound Laterality: Left, Lateral Cleanser: Soap and Water 3 x Per Week/15 Days Discharge Instructions: Gently cleanse wound with antibacterial soap, rinse and pat dry prior to dressing wounds Prim Dressing: Hydrofera Blue Ready Transfer Foam, 2.5x2.5 (in/in) 3 x Per Week/15 Days ary Discharge Instructions: Apply Hydrofera Blue Ready to wound bed as directed Secured With: Tegaderm Film 4x4 (in/in) 3 x Per Week/15 Days Discharge Instructions: Apply to wound bed Compression Stockings: Circaid Juxta Lite Compression Wrap Left Leg Compression Amount: 30-40 mmHG Discharge Instructions: Apply Circaid Juxta Lite Compression Wrap as directed Electronic Signature(s) Signed: 10/08/2022 6:21:14 PM By: Allen Derry PA-C Signed: 10/10/2022 4:28:11 PM By: Yevonne Pax  RN Entered By: Yevonne Pax on 10/07/2022 13:33:14 -------------------------------------------------------------------------------- Problem List Details Patient Name: Date of Service: [facility] [place], [name] 10/07/2022 12:30 PM Medical Record Number: [id number] Patient Account Number: [id number] Date of Birth/Sex: Treating RN: 1941-02-11 (82 y.o. 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/06/2023 No Yes I10 Essential (primary) hypertension 07/12/2022 No Yes I50.42 Chronic combined systolic (congestive) and diastolic (congestive) heart failure 07/12/2022 No Yes N18.30 Chronic kidney disease, stage 3 unspecified 07/12/2022 No Yes Inactive Problems Resolved Problems Electronic Signature(s) GUYLENE, JENTZSCH ([id number]) [id number] Page 4 of 5 Signed: 10/07/2022 12:52:40 PM By: Lenda Kelp PA-C Entered By: Lenda Kelp on 10/07/2022 12:52:40 -------------------------------------------------------------------------------- Progress Note Details Patient Name: Date of Service: [place], [name] 10/07/2022 12:30 PM Medical Record Number: [id number] Patient Account Number: [id number] Date of Birth/Sex: Treating RN: 06/26/1941 (82 y.o. [name] Primary Care Provider: Barbette Reichmann Other Clinician: Referring Provider: Treating Provider/Extender: Jearld Shines Weeks in Treatment: 12 Subjective Chief Complaint Information obtained from Patient Left LE Ulcers History of Present Illness (HPI) 07-12-2022 upon  evaluation today patient presents for initial inspection concerning issues she has been having with wounds on her left lower extremity. She thinks she may have hit the lateral portion of her leg on the dishwasher door but the other she thinks may have been a tape injury. It is more of a blister type situation. With that being said she tells me that she is having  some discomfort although is not terrible at this point. Fortunately I do not see any signs of infection locally or systemically at this time which is great news. No fevers, chills, nausea, vomiting, or diarrhea. Patient does have a history of diabetes mellitus type 2, lymphedema for which she is not really able to wear compression socks that she tells me she cannot get them on. She also has hypertension, congestive heart failure, and chronic kidney disease stage III. 07-26-2022 upon evaluation today patient appears to be doing well currently in regard to her wound. The compression wrapping does seem to be helping I am good I perform some debridement in regard to the left lateral leg but the other wound is actually looking quite well. I think we are headed in the right direction. Of note she did put her compression wrap down she was stating that this was getting too tight. 08-02-2022 upon evaluation today patient appears to be doing well currently in regard to her wound. She is actually showing signs of improvement in regard to both and I am extremely pleased with where we stand today. I do not see any evidence of active infection locally nor systemically which is great news. No fevers, chills, nausea, vomiting, or diarrhea. 08-09-2022 upon evaluation today patient appears to be doing well currently in regard to her wound. She has been tolerating the dressing changes without complication and overall 1 wound is healed the other is doing much better. Fortunately I do not see any signs of active infection locally or systemically which is great  news. 08-16-2022 upon evaluation today patient appears to be doing well currently in regard to her wound although this is still showing some signs of being slow to heal. She keeps pushing the compression wrap down she tells me it gets too tight at the top we try to make some adjustments but is just not working for her. With that being said I think that we are probably going to switch to Tubigrip since she is not able to keep this up on more concerned about it causing additional damage to her leg and obviously is not helping it heal either if that is what is happening. 08-23-2022 upon evaluation today patient appears to be doing well currently in regard to her wound. This is showing signs of some improvement but fortunately though it is doing better and unfortunately is still showing a lot of swelling in regard to her leg we have been having a hard time getting something on what she is able to keep on she is having a lot of discomfort a lot of ways. Fortunately I do not see any signs of active infection locally nor systemically which is great news. No fevers, chills, nausea, vomiting, or diarrhea. 08-30-2022 upon evaluation today patient appears to be doing well with regard to her wound. This is showing signs of being much smaller is doing quite well and overall I am extremely pleased with where we stand I do not see any signs of infection locally nor systemically at this point. 3/11; patient presents for follow-up. She has been using Hydrofera Blue along with her juxta light compression daily. She has no issues or complaints today. 09-23-2022 upon evaluation today patient appears to be doing well currently in regard to her wound which is actually measuring significantly smaller. I am actually very pleased with where we stand I think we are headed in the right direction and she is extremely happy to hear this as well. 09-30-2022 upon evaluation  today patient appears to be doing well currently in regard to her  wound. She has been tolerating the dressing changes without complication the wound is much smaller no debridement needed but she does seem to be making some pretty good progress here. Fortunately I do not see any signs of active infection locally nor systemically which is great news. No fevers, chills, nausea, vomiting, or diarrhea. 10-07-2022 upon evaluation today patient presents for initial inspection here in the clinic concerning her wound on the left lateral lower extremity. She is very close to complete closure but still having some trouble with this as such. Fortunately I do not see any evidence of active infection locally nor systemically which is great news. No fevers, chills, nausea, vomiting, or diarrhea. Objective Constitutional Well-nourished and well-hydrated in no acute distress. Vitals Time Taken: 12:51 PM, Height: 69 in, Weight: 250 lbs, BMI: 36.9, Temperature: 98.3 F, Pulse: 52 bpm, Respiratory Rate: 18 breaths/min, Blood Pressure: 134/61 mmHg. DAURICE, OVANDO ([id number]) [id number] Page 5 of 5 Respiratory normal breathing without difficulty. Psychiatric this patient is able to make decisions and demonstrates good insight into disease process. Alert and Oriented x 3. pleasant and cooperative. General Notes: Upon inspection patient's wound bed actually showed signs again of being almost completely healed we are close but not quite done with what we are seeing currently. I do believe that the patient should continue to utilize the dressing changes as before since she is doing so well with this I see no need to make any aggressive changes at this point. Integumentary (Hair, Skin) Wound #2 status is Open. Original cause of wound was Gradually Appeared. The date acquired was: 05/01/2022. The wound has been in treatment 12 weeks. The wound is located on the Left,Lateral Lower Leg. The wound measures 0.2cm length x 0.4cm width x 0.1cm depth; 0.063cm^2 area  and 0.006cm^3 volume. There is Fat Layer (Subcutaneous Tissue) exposed. There is no tunneling or undermining noted. There is a medium amount of serosanguineous drainage noted. There is large (67-100%) granulation within the wound bed. There is no necrotic tissue within the wound bed. Assessment Active Problems ICD-10 Type 2 diabetes mellitus with other skin ulcer Lymphedema, not elsewhere classified Non-pressure chronic ulcer of other part of left lower leg with fat layer exposed Essential (primary) hypertension Chronic combined systolic (congestive) and diastolic (congestive) heart failure Chronic kidney disease, stage 3 unspecified Plan Follow-up Appointments: Return Appointment in 1 week. Bathing/ Shower/ Hygiene: Wash wounds with antibacterial soap and water. May shower; gently cleanse wound with antibacterial soap, rinse and pat dry prior to dressing wounds Anesthetic (Use 'Patient Medications' Section for Anesthetic Order Entry): Lidocaine applied to wound bed Edema Control - Lymphedema / Segmental Compressive Device / Other: Elevate, Exercise Daily and Avoid Standing for Long Periods of Time. Elevate legs to the level of the heart and pump ankles as often as possible Elevate leg(s) parallel to the floor when sitting. WOUND #2: - Lower Leg Wound Laterality: Left, Lateral Cleanser: Soap and Water 3 x Per Week/15 Days Discharge Instructions: Gently cleanse wound with antibacterial soap, rinse and pat dry prior to dressing wounds Prim Dressing: Hydrofera Blue Ready Transfer Foam, 2.5x2.5 (in/in) 3 x Per Week/15 Days ary Discharge Instructions: Apply Hydrofera Blue Ready to wound bed as directed Secured With: T egaderm Film 4x4 (in/in) 3 x Per Week/15 Days Discharge Instructions: Apply to wound bed Com pression Stockings: Circaid Juxta Lite Compression Wrap Compression Amount: 30-40 mmHg (left) Discharge Instructions: Apply Circaid Juxta Lite Compression Wrap  as directed 1. I  would recommend currently that we have the patient continue to monitor for any signs of infection or worsening. Based on what I am seeing I do believe that we are moving in the right direction. 2. I am going to specifically recommend that we have her still use the [facility] along with a Tegaderm which has done extremely well for her. We will see patient back for reevaluation in 1 week here in the clinic. If anything worsens or changes patient will contact our office for additional recommendations. Electronic Signature(s) Signed: 10/11/2022 7:36:38 AM By: Allen Derry PA-C Entered By: Allen Derry on 10/11/2022 07:36:38

## 2022-10-10 NOTE — Progress Notes (Signed)
[name], [name] ([id number]) [id number] Page 1 of 5 Visit Report for 10/07/2022 Arrival Information Details Patient Name: Date of Service: [place], [name] Inches 10/07/2022 12:30 PM Medical Record Number: [id number] Patient Account Number: [id number] Date of Birth/Sex: Treating RN: 17-Sep-1940 (82 y.o. [name] Primary Care Tanaisha Pittman: Barbette Reichmann Other Clinician: Referring Delany Steury: Treating Quinto Tippy/Extender: Jearld Shines Weeks in Treatment: 12 Visit Information History Since Last Visit Added or deleted any medications: No Patient Arrived: Ambulatory Any new allergies or adverse reactions: No Arrival Time: 12:51 Had a fall or experienced change in No Accompanied By: self activities of daily living that may affect Transfer Assistance: None risk of falls: Patient Identification Verified: Yes Signs or symptoms of abuse/neglect since last visito No Secondary Verification Process Completed: Yes Hospitalized since last visit: No Patient Requires Transmission-Based Precautions: No Implantable device outside of the clinic excluding No Patient Has Alerts: Yes cellular tissue based products placed in the [name] Patient Alerts: 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 08/14/21 since last visit: R 1.04 TBI .96 08/14/21 Has Dressing in Place as Prescribed: Yes Pain Present Now: No Electronic Signature(s) Signed: 10/10/2022 4:28:11 PM By: Yevonne Pax RN Entered By: Yevonne Pax on 10/07/2022 12:51:22 -------------------------------------------------------------------------------- Lower Extremity Assessment Details Patient Name: Date of Service: [name] Inches 10/07/2022 12:30 PM Medical Record Number: [id number] Patient Account Number: [id number] Date of Birth/Sex: Treating RN: 12-Sep-1940 (82 y.o. [name] Primary Care Sartaj Hoskin: Barbette Reichmann Other Clinician: Referring Jennylee Uehara: Treating Stpehen Petitjean/Extender: Jearld Shines Weeks in  Treatment: 12 Edema Assessment Assessed: [Left: No] [Right: No] Edema: [Left: N] [Right: o] Calf Left: Right: Point of Measurement: 35 cm From Medial Instep 41.5 cm Ankle Left: Right: Point of Measurement: 10 cm From Medial Instep 25.2 cm Vascular Assessment Pulses: Dorsalis Pedis Palpable: [Left:Yes] Electronic Signature(s) Signed: 10/10/2022 4:28:11 PM By: Yevonne Pax RN Entered By: Yevonne Pax on 10/07/2022 12:54:49 Antilla, Deniese P ([id number]) [id number] Page 2 of 5 -------------------------------------------------------------------------------- Multi Wound Chart Details Patient Name: Date of Service: [name] Inches 10/07/2022 12:30 PM Medical Record Number: [id number] Patient Account Number: [id number] Date of Birth/Sex: Treating RN: 12-09-1940 (82 y.o. [name] Primary Care Annissa Andreoni: Barbette Reichmann Other Clinician: Referring Zalia Hautala: Treating Roy Snuffer/Extender: Karle Plumber, Vishwanath Weeks in Treatment: 12 Vital Signs Height(in): 69 Pulse(bpm): 52 Weight(lbs): 250 Blood Pressure(mmHg): 134/61 Body Mass Index(BMI): 36.9 Temperature(F): 98.3 Respiratory Rate(breaths/min): 18 [2:Photos:] [N/A:N/A] Left, Lateral Lower Leg N/A N/A Wound Location: Gradually Appeared N/A N/A Wounding Event: Diabetic Wound/Ulcer of the Lower N/A N/A Primary Etiology: Extremity Lymphedema, Congestive Heart N/A N/A Comorbid History: Failure, Hypertension, Type II Diabetes 05/01/2022 N/A N/A Date Acquired: 12 N/A N/A Weeks of Treatment: Open N/A N/A Wound Status: No N/A N/A Wound Recurrence: 0.2x0.4x0.1 N/A N/A Measurements L x W x D (cm) 0.063 N/A N/A A (cm) : rea 0.006 N/A N/A Volume (cm) : 97.80% N/A N/A % Reduction in A rea: 98.90% N/A N/A % Reduction in Volume: Grade 2 N/A N/A Classification: Medium N/A N/A Exudate A mount: Serosanguineous N/A N/A Exudate Type: red, brown N/A N/A Exudate Color: Large  (67-100%) N/A N/A Granulation A mount: None Present (0%) N/A N/A Necrotic A mount: Fat Layer (Subcutaneous Tissue): Yes N/A N/A Exposed Structures: Fascia: No Tendon: No Muscle: No Joint: No Bone: No Small (1-33%) N/A N/A Epithelialization: Treatment Notes Electronic Signature(s) Signed: 10/10/2022 4:28:11 PM By: Yevonne Pax RN Entered By: Yevonne Pax on 10/07/2022 12:54:54 -------------------------------------------------------------------------------- Multi-Disciplinary Care Plan Details Patient Name: Date of Service: [name]  IN, [name] 10/07/2022 12:30 PM Medical Record Number: [id number] Patient Account Number: [id number] Date of Birth/Sex: Treating RN: 02-16-1941 (82 y.o. Wanda FinnerF) Epps, Carrie Primary Care Ahri Olson: Barbette ReichmannHande, Vishwanath Other Clinician: Referring Caran Storck: Treating Frances Joynt/Extender: Karle PlumberStone, Hoyt Hande, Vishwanath Weeks in Treatment: [address] [name], [name] ([id number]) [id number] Page 3 of 5 Active Inactive Wound/Skin Impairment Nursing Diagnoses: Knowledge deficit related to ulceration/compromised skin integrity Goals: Patient/caregiver will verbalize understanding of skin care regimen Date Initiated: 07/12/2022 Target Resolution Date: 11/06/2022 Goal Status: Active Ulcer/skin breakdown will have a volume reduction of 30% by week 4 Date Initiated: 07/12/2022 Date Inactivated: 10/07/2022 Target Resolution Date: 08/12/2022 Goal Status: Met Ulcer/skin breakdown will have a volume reduction of 50% by week 8 Date Initiated: 07/12/2022 Date Inactivated: 10/07/2022 Target Resolution Date: 09/10/2022 Goal Status: Met Ulcer/skin breakdown will have a volume reduction of 80% by week 12 Date Initiated: 07/12/2022 Target Resolution Date: 10/11/2022 Goal Status: Active Ulcer/skin breakdown will heal within 14 weeks Date Initiated: 07/12/2022 Target Resolution Date: 11/10/2022 Goal Status: Active Interventions: Assess patient/caregiver ability to obtain  necessary supplies Assess patient/caregiver ability to perform ulcer/skin care regimen upon admission and as needed Assess ulceration(s) every visit Notes: Electronic Signature(s) Signed: 10/10/2022 4:28:11 PM By: Yevonne PaxEpps, Carrie RN Entered By: Yevonne PaxEpps, Carrie on 10/07/2022 12:56:33 -------------------------------------------------------------------------------- Pain Assessment Details Patient Name: Date of Service: [place], [name] Inches UDREY P. 10/07/2022 12:30 PM Medical Record Number: [id number] Patient Account Number: [id number] Date of Birth/Sex: Treating RN: 02-16-1941 (82 y.o. Wanda FinnerF) Epps, Carrie Primary Care Sandrika Schwinn: Barbette ReichmannHande, Vishwanath Other Clinician: Referring Tkai Large: Treating Claude Swendsen/Extender: Jearld ShinesStone, Hoyt Hande, Vishwanath Weeks in Treatment: 12 Active Problems Location of Pain Severity and Description of Pain Patient Has Paino No Site Locations Pain Management and Medication Current Pain Management: [facility] [place], Jadyn P ([id number]) [id number] Page 4 of 5 Electronic Signature(s) Signed: 10/10/2022 4:28:11 PM By: Yevonne PaxEpps, Carrie RN Entered By: Yevonne PaxEpps, Carrie on 10/07/2022 12:51:46 -------------------------------------------------------------------------------- Patient/Caregiver Education Details [facility]: [name] P. 4/8/2024andnbsp12:30 PM Medical Record Number: [id number] Patient Account Number: [id number] Date of Birth/Gender: Treating RN: 02-16-1941 (82 y.o. Wanda FinnerF) Epps, Carrie Primary Care Physician: Barbette ReichmannHande, Vishwanath Other Clinician: Referring Physician: Treating Physician/Extender: Jearld ShinesStone, Hoyt Hande, Vishwanath Weeks in Treatment: 12 Education Assessment Education Provided To: Patient Education Topics Provided Wound/Skin Impairment: Handouts: Caring for Your Ulcer Methods: Explain/Verbal Responses: State content correctly Electronic Signature(s) Signed: 10/10/2022 4:28:11 PM By: Yevonne PaxEpps, Carrie RN Entered By: Yevonne PaxEpps, Carrie on 10/07/2022  12:56:47 -------------------------------------------------------------------------------- Wound Assessment Details Patient Name: [place], [name] In[place] UDREY P. 10/07/2022 12:30 PM Medical Record Number: [id number] Patient Account Number: [id number] Date of Birth/Sex: Treating RN: 02-16-1941 (82 y.o. Wanda FinnerF) Epps, Carrie Primary Care Donterius Filley: Barbette ReichmannHande, Vishwanath Other Clinician: Referring Keyonda Bickle: Treating Tyshana Nishida/Extender: Jearld ShinesStone, Hoyt Hande, Vishwanath Weeks in Treatment: 12 Wound Status Wound Number: 2 Primary Diabetic Wound/Ulcer of the Lower Extremity Etiology: Wound Location: Left, Lateral Lower Leg Wound Status: Open Wounding Event: Gradually Appeared Comorbid Lymphedema, Congestive Heart Failure, Hypertension, Type Date Acquired: 05/01/2022 History: II Diabetes Weeks Of Treatment: 12 Clustered Wound: No Photos Wound Measurements Length: (cm) 0.2 Width: (cm) 0.4 Depth: (cm) 0.1 Area: (cm) 0.0 , Mennie P ([id number]) Volume: (cm) 0.00 % Reduction in Area: 97.8% % Reduction in Volume: 98.9% Epithelialization: Small (1-33%) 63 Tunneling: No [id number] Page 5 of 5 6 Undermining: No Wound Description Classification: Grade 2 Exudate Amount: Medium Exudate Type: Serosanguineous Exudate Color: red, brown Foul Odor After Cleansing: No Slough/Fibrino No Wound Bed Granulation Amount: Large (67-100%) Exposed Structure Necrotic Amount: None Present (0%) Fascia  Exposed: No Fat Layer (Subcutaneous Tissue) Exposed: Yes Tendon Exposed: No Muscle Exposed: No Joint Exposed: No Bone Exposed: No Electronic Signature(s) Signed: 10/10/2022 4:28:11 PM By: Yevonne Pax RN Entered By: Yevonne Pax on 10/07/2022 12:54:06 -------------------------------------------------------------------------------- Vitals Details Patient Name: Date of Service: [name], [name] 10/07/2022 12:30 PM Medical Record Number: [id number] Patient Account Number:  [id number] Date of Birth/Sex: Treating RN: 1941-04-22 (82 y.o. [name] Primary Care Dariella Gillihan: Barbette Reichmann Other Clinician: Referring Metha Kolasa: Treating Ersel Enslin/Extender: Jearld Shines Weeks in Treatment: 12 Vital Signs Time Taken: 12:51 Temperature (F): 98.3 Height (in): 69 Pulse (bpm): 52 Weight (lbs): 250 Respiratory Rate (breaths/min): 18 Body Mass Index (BMI): 36.9 Blood Pressure (mmHg): 134/61 Reference Range: 80 - 120 mg / dl Electronic Signature(s) Signed: 10/10/2022 4:28:11 PM By: Yevonne Pax RN Entered By: Yevonne Pax on 10/07/2022 12:51:40

## 2022-10-11 ENCOUNTER — Ambulatory Visit
Admission: RE | Admit: 2022-10-11 | Discharge: 2022-10-11 | Disposition: A | Discharge status: Home / Self Care | Payer: Medicare Other | Source: Ambulatory Visit | Attending: Orthopedic Surgery | Admitting: Orthopedic Surgery

## 2022-10-11 DIAGNOSIS — M654 Radial styloid tenosynovitis [de Quervain]: Secondary | ICD-10-CM

## 2022-10-11 DIAGNOSIS — M25531 Pain in right wrist: Secondary | ICD-10-CM

## 2022-10-14 ENCOUNTER — Encounter: Payer: Medicare Other | Admitting: Physician Assistant

## 2022-10-14 DIAGNOSIS — E11622 Type 2 diabetes mellitus with other skin ulcer: Secondary | ICD-10-CM | POA: Diagnosis not present

## 2022-10-14 NOTE — Progress Notes (Signed)
NAVY, ROTHSCHILD ([id number]) [id number] Page 1 of 6 Visit Report for 10/14/2022 Chief Complaint Document Details Patient Name: Date of Service: [place], [name] 10/14/2022 11:00 A M Medical Record Number: [id number] Patient Account Number: [id number] Date of Birth/Sex: Treating RN: December 18, 1940 (82 y.o. [name] Primary Care Provider: Barbette Reichmann Other Clinician: Referring Provider: Treating Provider/Extender: Jearld Shines Weeks in Treatment: 13 Information Obtained from: Patient Chief Complaint Left LE Ulcers Electronic Signature(s) Signed: 10/14/2022 11:02:19 AM By: Allen Derry PA-C Entered By: Allen Derry on 10/14/2022 11:02:19 -------------------------------------------------------------------------------- HPI Details Patient Name: Date of Service: [name] 10/14/2022 11:00 A M Medical Record Number: [id number] Patient Account Number: [id number] Date of Birth/Sex: Treating RN: Nov 17, 1940 (82 y.o. [name] Primary Care Provider: Barbette Reichmann Other Clinician: Referring Provider: Treating Provider/Extender: Jearld Shines Weeks in Treatment: 13 History of Present Illness HPI Description: 07-12-2022 upon evaluation today patient presents for initial inspection concerning issues she has been having with wounds on her left lower extremity. She thinks she may have hit the lateral portion of her leg on the dishwasher door but the other she thinks may have been a tape injury. It is more of a blister type situation. With that being said she tells me that she is having some discomfort although is not terrible at this point. Fortunately I do not see any signs of infection locally or systemically at this time which is great news. No fevers, chills, nausea, vomiting, or diarrhea. Patient does have a history of diabetes mellitus type 2, lymphedema for which she is not really able to wear compression  socks that she tells me she cannot get them on. She also has hypertension, congestive heart failure, and chronic kidney disease stage III. 07-26-2022 upon evaluation today patient appears to be doing well currently in regard to her wound. The compression wrapping does seem to be helping I am good I perform some debridement in regard to the left lateral leg but the other wound is actually looking quite well. I think we are headed in the right direction. Of note she did put her compression wrap down she was stating that this was getting too tight. 08-02-2022 upon evaluation today patient appears to be doing well currently in regard to her wound. She is actually showing signs of improvement in regard to both and I am extremely pleased with where we stand today. I do not see any evidence of active infection locally nor systemically which is great news. No fevers, chills, nausea, vomiting, or diarrhea. 08-09-2022 upon evaluation today patient appears to be doing well currently in regard to her wound. She has been tolerating the dressing changes without complication and overall 1 wound is healed the other is doing much better. Fortunately I do not see any signs of active infection locally or systemically which is great news. 08-16-2022 upon evaluation today patient appears to be doing well currently in regard to her wound although this is still showing some signs of being slow to heal. She keeps pushing the compression wrap down she tells me it gets too tight at the top we try to make some adjustments but is just not working for her. With that being said I think that we are probably going to switch to Tubigrip since she is not able to keep this up on more concerned about it causing additional damage to her leg and obviously is not helping it heal either if that is what is happening. 08-23-2022  upon evaluation today patient appears to be doing well currently in regard to her wound. This is showing signs of some  improvement but fortunately though it is doing better and unfortunately is still showing a lot of swelling in regard to her leg we have been having a hard time getting something on what she is able to keep on she is having a lot of discomfort a lot of ways. Fortunately I do not see any signs of active infection locally nor systemically which is great news. No fevers, chills, nausea, vomiting, or diarrhea. 08-30-2022 upon evaluation today patient appears to be doing well with regard to her wound. This is showing signs of being much smaller is doing quite well and overall I am extremely pleased with where we stand I do not see any signs of infection locally nor systemically at this point. 3/11; patient presents for follow-up. She has been using Hydrofera Blue along with her juxta light compression daily. She has no issues or complaints today. 09-23-2022 upon evaluation today patient appears to be doing well currently in regard to her wound which is actually measuring significantly smaller. I am actually very pleased with where we stand I think we are headed in the right direction and she is extremely happy to hear this as well. 09-30-2022 upon evaluation today patient appears to be doing well currently in regard to her wound. She has been tolerating the dressing changes without complication the wound is much smaller no debridement needed but she does seem to be making some pretty good progress here. Fortunately I do not see any signs of active infection locally nor systemically which is great news. No fevers, chills, nausea, vomiting, or diarrhea. [name], [name] ([id number]) [id number] Page 2 of 6 10-07-2022 upon evaluation today patient presents for initial inspection here in the clinic concerning her wound on the left lateral lower extremity. She is very close to complete closure but still having some trouble with this as such. Fortunately I do not see any evidence of active  infection locally nor systemically which is great news. No fevers, chills, nausea, vomiting, or diarrhea. 10-14-2022 upon evaluation today patient actually appears to be doing excellent in regard to her wounds which are showing signs of great improvement. I do not see any signs of opening or infection at this point I think that her wound is completely healed which is great news. Electronic Signature(s) Signed: 10/14/2022 11:12:45 AM By: Allen Derry PA-C Entered By: Allen Derry on 10/14/2022 11:12:45 -------------------------------------------------------------------------------- Physical Exam Details Patient Name: Date of Service: [place], [name] 10/14/2022 11:00 A M Medical Record Number: [id number] Patient Account Number: [id number] Date of Birth/Sex: Treating RN: 07-Jul-1940 (82 y.o. [name] Primary Care Provider: Barbette Reichmann Other Clinician: Referring Provider: Treating Provider/Extender: Jearld Shines Weeks in Treatment: 13 Constitutional Obese and well-hydrated in no acute distress. Respiratory normal breathing without difficulty. Psychiatric this patient is able to make decisions and demonstrates good insight into disease process. Alert and Oriented x 3. pleasant and cooperative. Notes Upon inspection patient's wound bed actually showed signs of good granulation epithelization at this point. Fortunately there does not appear to be any signs of active infection systemically which is great news I am very pleased with the fact that she is healed and she is excited as well. Electronic Signature(s) Signed: 10/14/2022 11:13:05 AM By: Allen Derry PA-C Entered By: Allen Derry on 10/14/2022 11:13:05 -------------------------------------------------------------------------------- Physician Orders Details Patient Name: Date of Service: [place]anda [place], A UDREY  [name]. 10/14/2022 11:00 A M Medical Record Number: [id number] Patient Account Number: [id number] Date of  Birth/Sex: Treating RN: December 10, 1940 (82 y.o. [name] Primary Care Provider: Barbette Reichmann Other Clinician: Referring Provider: Treating Provider/Extender: Jearld Shines Weeks in Treatment: 25 Verbal / Phone Orders: No Diagnosis Coding ICD-10 Coding Code Description E11.622 Type 2 diabetes mellitus with other skin ulcer I89.0 Lymphedema, not elsewhere classified L97.822 Non-pressure chronic ulcer of other part of left lower leg with fat layer exposed I10 Essential (primary) hypertension I50.42 Chronic combined systolic (congestive) and diastolic (congestive) heart failure N18.30 Chronic kidney disease, stage 3 unspecified Discharge From [facility] Services Discharge from [facility] Center Treatment Complete Wear compression garments daily. Put garments on first thing when you wake up and remove them before bed. Moisturize legs daily after removing compression garments. Electronic Signature(s) Signed: 10/15/2022 8:44:52 AM By: Allen Derry PA-C Signed: 10/16/2022 4:41:51 PM By: Yevonne Pax RN [name], [name] ([id number]) PM By: Yevonne Pax RN [phone].pdf Page 3 of 6 Signed: 10/16/2022 4:41:51 Entered By: Yevonne Pax on 10/14/2022 11:04:14 -------------------------------------------------------------------------------- Problem List Details Patient Name: Date of Service: [place], [name] 10/14/2022 11:00 A M Medical Record Number: [id number] Patient Account Number: [id number] Date of Birth/Sex: Treating RN: 22-Nov-1940 (82 y.o. [name] Primary Care Provider: Barbette Reichmann Other Clinician: Referring Provider: Treating Provider/Extender: Jearld Shines Weeks in Treatment: 13 Active Problems ICD-10 Encounter Code Description Active Date MDM Diagnosis E11.622 Type 2 diabetes mellitus with other skin ulcer 07/12/2022 No Yes I89.0 Lymphedema, not elsewhere classified 07/12/2022 No Yes L97.822 Non-pressure  chronic ulcer of other part of left lower leg with fat layer exposed1/06/2023 No Yes I10 Essential (primary) hypertension 07/12/2022 No Yes I50.42 Chronic combined systolic (congestive) and diastolic (congestive) heart failure 07/12/2022 No Yes N18.30 Chronic kidney disease, stage 3 unspecified 07/12/2022 No Yes Inactive Problems Resolved Problems Electronic Signature(s) Signed: 10/14/2022 11:02:16 AM By: Allen Derry PA-C Entered By: Allen Derry on 10/14/2022 11:02:15 -------------------------------------------------------------------------------- Progress Note Details Patient Name: Date of Service: [name] 10/14/2022 11:00 A M Medical Record Number: [id number] Patient Account Number: [id number] Date of Birth/Sex: Treating RN: 12/16/1940 (82 y.o. [name] Primary Care Provider: Barbette Reichmann Other Clinician: Referring Provider: Treating Provider/Extender: Jearld Shines Weeks in Treatment: 13 Subjective Chief Complaint Information obtained from Patient Left LE Ulcers History of Present Illness (HPI) 07-12-2022 upon evaluation today patient presents for initial inspection concerning issues she has been having with wounds on her left lower extremity. She thinks she may have hit the lateral portion of her leg on the dishwasher door but the other she thinks may have been a tape injury. It is more of a blister type [name], [name] ([id number]) [phone].pdf Page 4 of 6 situation. With that being said she tells me that she is having some discomfort although is not terrible at this point. Fortunately I do not see any signs of infection locally or systemically at this time which is great news. No fevers, chills, nausea, vomiting, or diarrhea. Patient does have a history of diabetes mellitus type 2, lymphedema for which she is not really able to wear compression socks that she tells me she cannot get them on. She also has hypertension,  congestive heart failure, and chronic kidney disease stage III. 07-26-2022 upon evaluation today patient appears to be doing well currently in regard to her wound. The compression wrapping does seem to be helping I am good I perform some debridement in regard to  the left lateral leg but the other wound is actually looking quite well. I think we are headed in the right direction. Of note she did put her compression wrap down she was stating that this was getting too tight. 08-02-2022 upon evaluation today patient appears to be doing well currently in regard to her wound. She is actually showing signs of improvement in regard to both and I am extremely pleased with where we stand today. I do not see any evidence of active infection locally nor systemically which is great news. No fevers, chills, nausea, vomiting, or diarrhea. 08-09-2022 upon evaluation today patient appears to be doing well currently in regard to her wound. She has been tolerating the dressing changes without complication and overall 1 wound is healed the other is doing much better. Fortunately I do not see any signs of active infection locally or systemically which is great news. 08-16-2022 upon evaluation today patient appears to be doing well currently in regard to her wound although this is still showing some signs of being slow to heal. She keeps pushing the compression wrap down she tells me it gets too tight at the top we try to make some adjustments but is just not working for her. With that being said I think that we are probably going to switch to Tubigrip since she is not able to keep this up on more concerned about it causing additional damage to her leg and obviously is not helping it heal either if that is what is happening. 08-23-2022 upon evaluation today patient appears to be doing well currently in regard to her wound. This is showing signs of some improvement but fortunately though it is doing better and unfortunately is  still showing a lot of swelling in regard to her leg we have been having a hard time getting something on what she is able to keep on she is having a lot of discomfort a lot of ways. Fortunately I do not see any signs of active infection locally nor systemically which is great news. No fevers, chills, nausea, vomiting, or diarrhea. 08-30-2022 upon evaluation today patient appears to be doing well with regard to her wound. This is showing signs of being much smaller is doing quite well and overall I am extremely pleased with where we stand I do not see any signs of infection locally nor systemically at this point. 3/11; patient presents for follow-up. She has been using Hydrofera Blue along with her juxta light compression daily. She has no issues or complaints today. 09-23-2022 upon evaluation today patient appears to be doing well currently in regard to her wound which is actually measuring significantly smaller. I am actually very pleased with where we stand I think we are headed in the right direction and she is extremely happy to hear this as well. 09-30-2022 upon evaluation today patient appears to be doing well currently in regard to her wound. She has been tolerating the dressing changes without complication the wound is much smaller no debridement needed but she does seem to be making some pretty good progress here. Fortunately I do not see any signs of active infection locally nor systemically which is great news. No fevers, chills, nausea, vomiting, or diarrhea. 10-07-2022 upon evaluation today patient presents for initial inspection here in the clinic concerning her wound on the left lateral lower extremity. She is very close to complete closure but still having some trouble with this as such. Fortunately I do not see any evidence of active  infection locally nor systemically which is great news. No fevers, chills, nausea, vomiting, or diarrhea. 10-14-2022 upon evaluation today patient actually  appears to be doing excellent in regard to her wounds which are showing signs of great improvement. I do not see any signs of opening or infection at this point I think that her wound is completely healed which is great news. Objective Constitutional Obese and well-hydrated in no acute distress. Vitals Time Taken: 10:57 AM, Height: 69 in, Weight: 250 lbs, BMI: 36.9, Temperature: 97.5 F, Pulse: 66 bpm, Respiratory Rate: 18 breaths/min, Blood Pressure: 161/83 mmHg. Respiratory normal breathing without difficulty. Psychiatric this patient is able to make decisions and demonstrates good insight into disease process. Alert and Oriented x 3. pleasant and cooperative. General Notes: Upon inspection patient's wound bed actually showed signs of good granulation epithelization at this point. Fortunately there does not appear to be any signs of active infection systemically which is great news I am very pleased with the fact that she is healed and she is excited as well. Integumentary (Hair, Skin) Wound #2 status is Open. Original cause of wound was Gradually Appeared. The date acquired was: 05/01/2022. The wound has been in treatment 13 weeks. The wound is located on the Left,Lateral Lower Leg. The wound measures 0cm length x 0cm width x 0cm depth; 0cm^2 area and 0cm^3 volume. There is no tunneling or undermining noted. There is a none present amount of drainage noted. There is no granulation within the wound bed. There is no necrotic tissue within the wound bed. Assessment Active Problems ICD-10 Type 2 diabetes mellitus with other skin ulcer Lymphedema, not elsewhere classified Non-pressure chronic ulcer of other part of left lower leg with fat layer exposed [name], [name] ([id number]) [id number] Page 5 of 6 Essential (primary) hypertension Chronic combined systolic (congestive) and diastolic (congestive) heart failure Chronic kidney disease, stage 3  unspecified Plan Discharge From [facility] Services: Discharge from Wound Care Center Treatment Complete Wear compression garments daily. Put garments on first thing when you wake up and remove them before bed. Moisturize legs daily after removing compression garments. 1. I am going to recommend that we have the patient continue to monitor for any signs of worsening infection. Based on what I am seeing I do believe that she is making excellent progress here in fact this appears to be completely healed. 2. I am also can recommend the patient should continue to elevate her leg and use her compression stockings she has a juxta lite this is given the appropriate to continue. We will see the patient back for follow-up visit as needed. Electronic Signature(s) Signed: 10/14/2022 11:16:41 AM By: Allen Derry PA-C Previous Signature: 10/14/2022 11:13:30 AM Version By: Allen Derry PA-C Entered By: Allen Derry on 10/14/2022 11:16:40 -------------------------------------------------------------------------------- SuperBill Details Patient Name: Date of Service: [name] 10/14/2022 Medical Record Number: [id number] Patient Account Number: [id number] Date of Birth/Sex: Treating RN: 14-Feb-1941 (82 y.o. [name] Primary Care Provider: Barbette Reichmann Other Clinician: Referring Provider: Treating Provider/Extender: Jearld Shines Weeks in Treatment: 13 Diagnosis Coding ICD-10 Codes Code Description E11.622 Type 2 diabetes mellitus with other skin ulcer I89.0 Lymphedema, not elsewhere classified L97.822 Non-pressure chronic ulcer of other part of left lower leg with fat layer exposed I10 Essential (primary) hypertension I50.42 Chronic combined systolic (congestive) and diastolic (congestive) heart failure N18.30 Chronic kidney disease, stage 3 unspecified Facility Procedures : CPT4 Code: [id number] Description: 44034 - WOUND CARE VISIT-LEV 2 EST PT Modifier: Quantity:  1  Physician Procedures : CPT4 Code Description Modifier [phone] [id number] - WC PHYS LEVEL 3 - EST PT ICD-10 Diagnosis Description E11.622 Type 2 diabetes mellitus with other skin ulcer I89.0 Lymphedema, not elsewhere classified L97.822 Non-pressure chronic ulcer of other part of  left lower leg with fat layer exposed I10 Essential (primary) hypertension Quantity: 1 Electronic Signature(s) Signed: 10/14/2022 11:22:00 AM By: Yevonne Pax RN Signed: 10/15/2022 8:44:52 AM By: Allen Derry PA-C Previous Signature: 10/14/2022 11:16:57 AM Version By: Allen Derry PA-C Previous Signature: 10/14/2022 11:15:34 AM Version By: Lawerance Sabal, [name] ([id number]) [id number] Page 6 of 6 Entered By: Yevonne Pax on 10/14/2022 11:21:59

## 2022-10-17 NOTE — Progress Notes (Signed)
KINSLY, HILD ([id number]) [id number] Page 1 of 7 Visit Report for 10/14/2022 Arrival Information Details Patient Name: Date of Service: [place], [name] 10/14/2022 11:00 A M Medical Record Number: [id number] Patient Account Number: [id number] Date of Birth/Sex: Treating RN: 25-Dec-1940 (82 y.o. [name] Primary Care Hasini Peachey: Barbette Reichmann Other Clinician: Referring Gracielynn Birkel: Treating Rickita Forstner/Extender: Jearld Shines Weeks in Treatment: 13 Visit Information History Since Last Visit Added or deleted any medications: No Patient Arrived: Ambulatory Any new allergies or adverse reactions: No Arrival Time: 10:57 Had a fall or experienced change in No Accompanied By: self activities of daily living that may affect Transfer Assistance: None risk of falls: Patient Identification Verified: Yes Signs or symptoms of abuse/neglect since last visito No Secondary Verification Process Completed: Yes Hospitalized since last visit: No Patient Requires Transmission-Based Precautions: No Implantable device outside of the clinic excluding No Patient Has Alerts: Yes cellular tissue based products placed in the center Patient Alerts: 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 08/14/21 since last visit: R 1.04 TBI .96 08/14/21 Has Dressing in Place as Prescribed: Yes Pain Present Now: No Electronic Signature(s) Signed: 10/16/2022 4:41:51 PM By: Yevonne Pax RN Entered By: Yevonne Pax on 10/14/2022 10:57:37 -------------------------------------------------------------------------------- Clinic Level of Care Assessment Details Patient Name: Date of Service: [name], [name] 10/14/2022 11:00 A M Medical Record Number: [id number] Patient Account Number: [id number] Date of Birth/Sex: Treating RN: August 16, 1940 (82 y.o. [name] Primary Care Sila Sarsfield: Barbette Reichmann Other Clinician: Referring Pleas Carneal: Treating Truong Delcastillo/Extender: Jearld Shines Weeks  in Treatment: 13 Clinic Level of Care Assessment Items TOOL 4 Quantity Score X- 1 0 Use when only an EandM is performed on FOLLOW-UP visit ASSESSMENTS - Nursing Assessment / Reassessment X- 1 10 Reassessment of Co-morbidities (includes updates in patient status) X- 1 5 Reassessment of Adherence to Treatment Plan ASSESSMENTS - Wound and Skin A ssessment / Reassessment X - Simple Wound Assessment / Reassessment - one wound 1 5 []  - 0 Complex Wound Assessment / Reassessment - multiple wounds []  - 0 Dermatologic / Skin Assessment (not related to wound area) ASSESSMENTS - Focused Assessment []  - 0 Circumferential Edema Measurements - multi extremities []  - 0 Nutritional Assessment / Counseling / Intervention []  - 0 Lower Extremity Assessment (monofilament, tuning fork, pulses) []  - 0 Peripheral Arterial Disease Assessment (using hand held doppler) ASSESSMENTS - Ostomy and/or Continence Assessment and Care []  - 0 Incontinence Assessment and Management []  - 0 Ostomy Care Assessment and Management (repouching, etc.) PROCESS - Coordination of Care X - Simple Patient / Family Education for ongoing care 1 15 [place], [name] ([id number]) [id number] Page 2 of 7 []  - 0 Complex (extensive) Patient / Family Education for ongoing care []  - 0 Staff obtains Chiropractor, Records, T Results / Process Orders est []  - 0 Staff telephones HHA, Nursing Homes / Clarify orders / etc []  - 0 Routine Transfer to another Facility (non-emergent condition) []  - 0 Routine Hospital Admission (non-emergent condition) []  - 0 New Admissions / Manufacturing engineer / Ordering NPWT Apligraf, etc. , []  - 0 Emergency Hospital Admission (emergent condition) X- 1 10 Simple Discharge Coordination []  - 0 Complex (extensive) Discharge Coordination PROCESS - Special Needs []  - 0 Pediatric / Minor Patient Management []  - 0 Isolation Patient Management []  - 0 Hearing / Language /  Visual special needs []  - 0 Assessment of Community assistance (transportation, D/C planning, etc.) []  - 0 Additional assistance / Altered mentation []  - 0 Support Surface(s) Assessment (  bed, cushion, seat, etc.) INTERVENTIONS - Wound Cleansing / Measurement  - 0 Simple Wound Cleansing - one wound  - 0 Complex Wound Cleansing - multiple wounds X- 1 5 Wound Imaging (photographs - any number of wounds)  - 0 Wound Tracing (instead of photographs)  - 0 Simple Wound Measurement - one wound  - 0 Complex Wound Measurement - multiple wounds INTERVENTIONS - Wound Dressings  - 0 Small Wound Dressing one or multiple wounds  - 0 Medium Wound Dressing one or multiple wounds  - 0 Large Wound Dressing one or multiple wounds  - 0 Application of Medications - topical  - 0 Application of Medications - injection INTERVENTIONS - Miscellaneous  - 0 External ear exam  - 0 Specimen Collection (cultures, biopsies, blood, body fluids, etc.)  - 0 Specimen(s) / Culture(s) sent or taken to Lab for analysis  - 0 Patient Transfer (multiple staff / Nurse, adult / Similar devices)  - 0 Simple Staple / Suture removal (25 or less)  - 0 Complex Staple / Suture removal (26 or more)  - 0 Hypo / Hyperglycemic Management (close monitor of Blood Glucose)  - 0 Ankle / Brachial Index (ABI) - do not check if billed separately X- 1 5 Vital Signs Has the patient been seen at the hospital within the last three years: Yes Total Score: 55 Level Of Care: New/Established - Level 2 Electronic Signature(s) Signed: 10/16/2022 4:41:51 PM By: Yevonne Pax RN Entered By: Yevonne Pax on 10/14/2022 11:21:46 Righter, Nyla [name] ([id number]) [id number] Page 3 of 7 -------------------------------------------------------------------------------- Encounter Discharge Information Details Patient Name: Date of Service: [name], [name] 10/14/2022 11:00 A  M Medical Record Number: [id number] Patient Account Number: [id number] Date of Birth/Sex: Treating RN: 06-Jul-1940 (82 y.o. [name] Primary Care Shaquill Iseman: Barbette Reichmann Other Clinician: Referring Oshua Mcconaha: Treating Sugey Trevathan/Extender: Jearld Shines Weeks in Treatment: 13 Encounter Discharge Information Items Discharge Condition: Stable Ambulatory Status: Ambulatory Discharge Destination: Home Transportation: Private Auto Accompanied By: self Schedule Follow-up Appointment: Yes Clinical Summary of Care: Electronic Signature(s) Signed: 10/14/2022 11:24:38 AM By: Yevonne Pax RN Entered By: Yevonne Pax on 10/14/2022 11:24:38 -------------------------------------------------------------------------------- Lower Extremity Assessment Details Patient Name: Date of Service: [place], [name] 10/14/2022 11:00 A M Medical Record Number: [id number] Patient Account Number: [id number] Date of Birth/Sex: Treating RN: October 09, 1940 (82 y.o. [name] Primary Care Estelle Greenleaf: Barbette Reichmann Other Clinician: Referring Avital Dancy: Treating Denys Salinger/Extender: Karle Plumber, Vishwanath Weeks in Treatment: 13 Edema Assessment Assessed: [Left: No] [Right: No] Edema: [Left: N] [Right: o] Vascular Assessment Pulses: Dorsalis Pedis Palpable: [Left:Yes] Electronic Signature(s) Signed: 10/16/2022 4:41:51 PM By: Yevonne Pax RN Entered By: Yevonne Pax on 10/14/2022 11:03:46 -------------------------------------------------------------------------------- Multi Wound Chart Details Patient Name: Date of Service: [facility] [place], [name] 10/14/2022 11:00 A M Medical Record Number: [id number] Patient Account Number: [id number] Date of Birth/Sex: Treating RN: [birth year] (82 y.o. [name] Primary Care Adraine Biffle: Barbette Reichmann Other Clinician: Referring Nayla Dias: Treating Eustacio Ellen/Extender: Jearld Shines Weeks in Treatment: 13 Vital Signs Height(in):  69 Pulse(bpm): 66 Weight(lbs): 250 Blood Pressure(mmHg): 161/83 Body Mass Index(BMI): 36.9 Temperature(F): 97.5 Respiratory Rate(breaths/min): 18 Gucciardo, Odena [name] ([id number]) [id number] Page 4 of 7 [2:Photos:] [N/A:N/A] Left, Lateral Lower Leg N/A N/A Wound Location: Gradually Appeared N/A N/A Wounding Event: Diabetic Wound/Ulcer of the Lower N/A N/A Primary Etiology: Extremity Lymphedema, Congestive Heart N/A N/A Comorbid History: Failure, Hypertension, Type II Diabetes 05/01/2022 N/A N/A Date Acquired: 13 N/A N/A Weeks of Treatment: Open N/A N/A Wound  Status: No N/A N/A Wound Recurrence: 0x0x0 N/A N/A Measurements L x W x D (cm) 0 N/A N/A A (cm) : rea 0 N/A N/A Volume (cm) : 100.00% N/A N/A % Reduction in A rea: 100.00% N/A N/A % Reduction in Volume: Grade 2 N/A N/A Classification: None Present N/A N/A Exudate A mount: None Present (0%) N/A N/A Granulation A mount: None Present (0%) N/A N/A Necrotic A mount: Fascia: No N/A N/A Exposed Structures: Fat Layer (Subcutaneous Tissue): No Tendon: No Muscle: No Joint: No Bone: No Large (67-100%) N/A N/A Epithelialization: Treatment Notes Electronic Signature(s) Signed: 10/16/2022 4:41:51 PM By: Yevonne Pax RN Entered By: Yevonne Pax on 10/14/2022 11:03:51 -------------------------------------------------------------------------------- Multi-Disciplinary Care Plan Details Patient Name: Date of Service: [place], [name] 10/14/2022 11:00 A M Medical Record Number: [id number] Patient Account Number: [id number] Date of Birth/Sex: Treating RN: 1940-09-20 (82 y.o. [name] Primary Care Hurley Blevins: Barbette Reichmann Other Clinician: Referring Raphaela Cannaday: Treating Casimer Russett/Extender: Jearld Shines Weeks in Treatment: 13 Active Inactive Electronic Signature(s) Signed: 10/14/2022 11:22:21 AM By: Yevonne Pax RN Entered By: Yevonne Pax on 10/14/2022  11:22:20 -------------------------------------------------------------------------------- Pain Assessment Details Patient Name: Date of Service: [place], [name] 10/14/2022 11:00 A M Medical Record Number: [id number] Patient Account Number: [id number] Date of Birth/Sex: Treating RN: [birth year] (82 y.o. [name] Primary Care Hawke Villalpando: Barbette Reichmann Other Clinician: Referring Yanitza Shvartsman: Treating Masson Nalepa/Extender: Jearld Shines [place], Shirline Frees ([id number]) [id number] Page 5 of 7 Weeks in Treatment: 13 Active Problems Location of Pain Severity and Description of Pain Patient Has Paino No Site Locations Pain Management and Medication Current Pain Management: Electronic Signature(s) Signed: 10/16/2022 4:41:51 PM By: Yevonne Pax RN Entered By: Yevonne Pax on 10/14/2022 10:58:03 -------------------------------------------------------------------------------- Patient/Caregiver Education Details Patient Name: Date of Service: [name], [name] 4/15/2024andnbsp11:00 A M Medical Record Number: [id number] Patient Account Number: [id number] Date of Birth/Gender: Treating RN: 1941-04-23 (82 y.o. [name] Primary Care Physician: Barbette Reichmann Other Clinician: Referring Physician: Treating Physician/Extender: Jearld Shines Weeks in Treatment: 13 Education Assessment Education Provided To: Patient Education Topics Provided Venous: Handouts: Controlling Swelling with Multilayered Compression Wraps Methods: Printed Responses: State content correctly Electronic Signature(s) Signed: 10/16/2022 4:41:51 PM By: Yevonne Pax RN Entered By: Yevonne Pax on 10/14/2022 11:23:07 -------------------------------------------------------------------------------- Wound Assessment Details Patient Name: Date of Service: [place], [name] 10/14/2022 11:00 A M Medical Record Number: [id number] Patient Account Number:  [id number] Date of Birth/Sex: Treating RN: June 20, 1941 (82 y.o. [name] Primary Care Carey Johndrow: Barbette Reichmann Other Clinician: Referring Dickson Kostelnik: Treating Natalie Leclaire/Extender: Karle Plumber, Vishwanath Weeks in Treatment: [address] [place]le[place] [name] ([id number]) [id number] Page 6 of 7 Wound Status Wound Number: 2 Primary Diabetic Wound/Ulcer of the Lower Extremity Etiology: Wound Location: Left, Lateral Lower Leg Wound Status: Open Wounding Event: Gradually Appeared Comorbid Lymphedema, Congestive Heart Failure, Hypertension, Type Date Acquired: 05/01/2022 History: II Diabetes Weeks Of Treatment: 13 Clustered Wound: No Photos Wound Measurements Length: (cm) Width: (cm) Depth: (cm) Area: (cm) Volume: (cm) 0 % Reduction in Area: 100% 0 % Reduction in Volume: 100% 0 Epithelialization: Large (67-100%) 0 Tunneling: No 0 Undermining: No Wound Description Classification: Grade 2 Exudate Amount: None Present Foul Odor After Cleansing: No Slough/Fibrino No Wound Bed Granulation Amount: None Present (0%) Exposed Structure Necrotic Amount: None Present (0%) Fascia Exposed: No Fat Layer (Subcutaneous Tissue) Exposed: No Tendon Exposed: No Muscle Exposed: No Joint Exposed: No Bone Exposed: No Electronic Signature(s) Signed: 10/16/2022 4:41:51 PM By: Yevonne Pax RN  Entered By: Yevonne Pax on 10/14/2022 11:03:27 -------------------------------------------------------------------------------- Vitals Details Patient Name: Date of Service: [place], [name] 10/14/2022 11:00 A M Medical Record Number: [id number] Patient Account Number: [id number] Date of Birth/Sex: Treating RN: [birth year] (82 y.o. [name] Primary Care Navdeep Halt: Barbette Reichmann Other Clinician: Referring Lori Popowski: Treating Jaynell Castagnola/Extender: Jearld Shines Weeks in Treatment: 13 Vital Signs Time Taken: 10:57 Temperature (F): 97.5 Height (in):  69 Pulse (bpm): 66 Weight (lbs): 250 Respiratory Rate (breaths/min): 18 Body Mass Index (BMI): 36.9 Blood Pressure (mmHg): 161/83 Reference Range: 80 - 120 mg / dl Electronic Signature(s) Signed: 10/16/2022 4:41:51 PM By: Yevonne Pax RN Entered By: Yevonne Pax on 10/14/2022 10:57:58 [place], Zerenity [name] ([id number]) [id number] Page 7 of 7

## 2022-10-29 ENCOUNTER — Ambulatory Visit
Admission: RE | Admit: 2022-10-29 | Discharge: 2022-10-29 | Disposition: A | Discharge status: Home / Self Care | Payer: Medicare Other | Source: Ambulatory Visit | Attending: Oncology | Admitting: Oncology

## 2022-10-29 ENCOUNTER — Other Ambulatory Visit: Payer: Self-pay | Admitting: Oncology

## 2022-10-29 DIAGNOSIS — K862 Cyst of pancreas: Secondary | ICD-10-CM

## 2022-10-29 MED ORDER — GADOBUTROL 1 MMOL/ML IV SOLN
10.0000 mL | Freq: Once | INTRAVENOUS | Status: AC | PRN
  Administered 2022-10-29: 10 mL via INTRAVENOUS

## 2022-10-31 ENCOUNTER — Encounter: Payer: Self-pay | Admitting: Oncology

## 2022-10-31 ENCOUNTER — Inpatient Hospital Stay (HOSPITAL_BASED_OUTPATIENT_CLINIC_OR_DEPARTMENT_OTHER): Payer: Medicare Other | Admitting: Oncology

## 2022-10-31 ENCOUNTER — Inpatient Hospital Stay: Payer: Medicare Other | Attending: Oncology

## 2022-10-31 VITALS — BP 172/79 | HR 56 | Temp 96.1°F | Resp 18 | Wt 249.1 lb

## 2022-10-31 DIAGNOSIS — Z08 Encounter for follow-up examination after completed treatment for malignant neoplasm: Secondary | ICD-10-CM

## 2022-10-31 DIAGNOSIS — D378 Neoplasm of uncertain behavior of other specified digestive organs: Secondary | ICD-10-CM | POA: Diagnosis not present

## 2022-10-31 DIAGNOSIS — R978 Other abnormal tumor markers: Secondary | ICD-10-CM | POA: Diagnosis not present

## 2022-10-31 DIAGNOSIS — K862 Cyst of pancreas: Secondary | ICD-10-CM | POA: Insufficient documentation

## 2022-10-31 DIAGNOSIS — Z853 Personal history of malignant neoplasm of breast: Secondary | ICD-10-CM | POA: Diagnosis not present

## 2022-10-31 DIAGNOSIS — Z803 Family history of malignant neoplasm of breast: Secondary | ICD-10-CM | POA: Diagnosis not present

## 2022-10-31 DIAGNOSIS — D7389 Other diseases of spleen: Secondary | ICD-10-CM

## 2022-10-31 DIAGNOSIS — Z8042 Family history of malignant neoplasm of prostate: Secondary | ICD-10-CM | POA: Insufficient documentation

## 2022-10-31 LAB — CBC WITH DIFFERENTIAL/PLATELET
Abs Immature Granulocytes: 0.02 10*3/uL (ref 0.00–0.07)
Basophils Absolute: 0 10*3/uL (ref 0.0–0.1)
Basophils Relative: 1 %
Eosinophils Absolute: 0.1 10*3/uL (ref 0.0–0.5)
Eosinophils Relative: 3 %
HCT: 45.9 % (ref 36.0–46.0)
Hemoglobin: 15.1 g/dL — ABNORMAL HIGH (ref 12.0–15.0)
Immature Granulocytes: 0 %
Lymphocytes Relative: 40 %
Lymphs Abs: 2.2 10*3/uL (ref 0.7–4.0)
MCH: 30.1 pg (ref 26.0–34.0)
MCHC: 32.9 g/dL (ref 30.0–36.0)
MCV: 91.6 fL (ref 80.0–100.0)
Monocytes Absolute: 0.4 10*3/uL (ref 0.1–1.0)
Monocytes Relative: 8 %
Neutro Abs: 2.7 10*3/uL (ref 1.7–7.7)
Neutrophils Relative %: 48 %
Platelets: 173 10*3/uL (ref 150–400)
RBC: 5.01 MIL/uL (ref 3.87–5.11)
RDW: 12.7 % (ref 11.5–15.5)
WBC: 5.6 10*3/uL (ref 4.0–10.5)
nRBC: 0 % (ref 0.0–0.2)

## 2022-10-31 LAB — COMPREHENSIVE METABOLIC PANEL
ALT: 10 U/L (ref 0–44)
AST: 17 U/L (ref 15–41)
Albumin: 4 g/dL (ref 3.5–5.0)
Alkaline Phosphatase: 96 U/L (ref 38–126)
Anion gap: 8 (ref 5–15)
BUN: 16 mg/dL (ref 8–23)
CO2: 26 mmol/L (ref 22–32)
Calcium: 9.8 mg/dL (ref 8.9–10.3)
Chloride: 105 mmol/L (ref 98–111)
Creatinine, Ser: 0.96 mg/dL (ref 0.44–1.00)
GFR, Estimated: 59 mL/min — ABNORMAL LOW (ref >60)
Glucose, Bld: 111 mg/dL — ABNORMAL HIGH (ref 70–99)
Potassium: 4.1 mmol/L (ref 3.5–5.1)
Sodium: 139 mmol/L (ref 135–145)
Total Bilirubin: 0.7 mg/dL (ref 0.3–1.2)
Total Protein: 7.3 g/dL (ref 6.5–8.1)

## 2022-10-31 NOTE — Assessment & Plan Note (Addendum)
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 Selena Batten.  Patient elected to continue surveillance. Recent MRI abdomen MRCP showed decreased size of the cyst.  Continue surveillance.   Repeat imaging in 1 year.  CEA and CA 19-9 have been monitored, levels are pending at the time of dictation.

## 2022-10-31 NOTE — Progress Notes (Signed)
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 Selena Batten.  Patient elected to continue surveillance. Recent MRI abdomen MRCP showed decreased size of the cyst.  Continue surveillance.   Repeat imaging in 1 year.  CEA and CA 19-9 have been monitored, levels are pending at the time of dictation.  Orders Placed This Encounter  Procedures   MR ABDOMEN MRCP W WO CONTAST    Standing Status:   Future    Standing Expiration Date:   10/31/2023    Order Specific Question:   If indicated for the ordered procedure, I authorize the administration of contrast media per Radiology protocol    Answer:   Yes    Order Specific Question:   What is the patient's sedation requirement?    Answer:   No Sedation    Order Specific Question:   Does the patient have a pacemaker or implanted devices?    Answer:   No    Order Specific Question:   Radiology Contrast Protocol - do NOT remove file path    Answer:   [email or web address]    Order Specific Question:   Preferred imaging location?    Answer:   [facility] (table limit - 550lbs)   CBC with Differential (Cancer Center Only)    Standing Status:   Future    Standing Expiration Date:   10/31/2023   CEA    Standing Status:   Future    Standing Expiration Date:   10/31/2023   CMP (Cancer Center only)    Standing Status:   Future    Standing Expiration Date:   10/31/2023   Cancer antigen 19-9    Standing Status:   Future    Standing Expiration Date:   10/31/2023   Follow-up in 12 months, repeat MRI All questions were answered. The patient knows to call the clinic with any problems, questions or concerns.  Rickard Patience, MD, PhD [facility] Hematology  Oncology 10/31/2022   REASON FOR VISIT:  Follow-up for pancreatic cyst, lung nodules  HISTORY OF PRESENTING ILLNESS:  [name] is a  82 y.o.  female presents for follow up of pancreatic cyst. lung nodules  CT chest showed bilateral bronchiectasis, lung nodules and thyroid nodules.. Incidental findings of ?kidney mass, not reported on radiologist. Subsequently CT abdomen pelvis was done which showed 2.3 lateral left upper pole renal cyst. Incidental finding of 4.9 cm rounded enhancing lesion in the anterior spleen, now from previous study in 2010. Patient was referred to [facility] for further evaluation.   #  seen by me on 07/03/2018.  At that time patient wants to be referred to [facility] oncology for further evaluation.  Patient called and requested another appointment for discussion of her CT scan that was done in November 2020.  09/15/2018 PET scan at [facility] showed no focal lesions are discernible in the spleen. NO other hypermetabolic activity in chest abdomen pelvis to suggest malignancy.  Patient had a follow up CT done on 05/25/2019 CT abdomen pelvis w contrast showed indeterminate splenic lesion has no interval changes.  14mm low attenudating lesion from the tai of the pancrease similar to prior CT, likely a side branch IPMN. Colonic diverticulosis  # 11/01/2019, patient had surgery evaluation by Dr. Selena Batten at [facility].  Patient was recommended to either proceed with endoscopy ultrasound/fine-needle aspiration or reimage in 6 months time by pancreatic protocol MRI was recommended.  Decision was made to continue follow-up with interval images and tumor markers.  #04/16/2021, MRI MRCP of the abdomen showed a stable size of the cystic lesion in the tail of the pancreas.  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    INTERVAL HISTORY MISSIE [name] is a 82 y.o. female who has above history reviewed by me today presents for follow up visit for pancreatic cyst. Patient has had MRI  MRCP abdomen done.   Patient reports feeling well.  has no new concerns.     Review of Systems  Constitutional:  Negative for appetite change, chills, fatigue, fever and unexpected weight change.  HENT:   Negative for hearing loss and voice change.   Eyes:  Negative for eye problems.  Respiratory:  Negative for chest tightness and cough.   Cardiovascular:  Negative for chest pain.  Gastrointestinal:  Negative for abdominal distention, abdominal pain and blood in stool.  Endocrine: Negative for hot flashes.  Genitourinary:  Negative for difficulty urinating and frequency.   Musculoskeletal:  Positive for back pain. Negative for arthralgias.  Skin:  Negative for itching and rash.  Neurological:  Negative for extremity weakness.  Hematological:  Negative for adenopathy.  Psychiatric/Behavioral:  Negative for confusion.     MEDICAL HISTORY:  Past Medical History:  Diagnosis Date   Acquired hammertoe    other   Acquired hammertoe    Angina pectoris, unspecified (HCC)    Angina pectoris, unspecified (HCC)    Arthritis    knees   Breast cancer (HCC) 1990   left breast   Cancer (HCC) 1990,s   left breast   CHF (congestive heart failure) (HCC)    Chronic kidney disease    stage 2   Degenerative joint disease    Dermatophytosis of nail    Diabetes mellitus type 2, uncomplicated (HCC)    diet controlled   Dyspnea    Essential hypertension, benign    Exostosis    unspecified site   Fracture of right ankle 2010   Followed by Dr. Ether Griffins   GERD (gastroesophageal reflux disease)    Hemorrhoids    Hypercholesterolemia    Hyperlipidemia, unspecified    Hypertension    Lymphedema of leg    left leg worse than right leg.   Obesity, unspecified    Osteoarthrosis involving lower leg    unspecified whether generalized or localized    Pre-diabetes    Pure hypercholesterolemia    Vitamin D deficiency, unspecified    Wears dentures    partial upper    SURGICAL HISTORY: Past  Surgical History:  Procedure Laterality Date   BREAST LUMPECTOMY Left 1995   BREAST SURGERY Left 1990   lumpectomy   BUNIONECTOMY Bilateral 1990's   plus hammer toe repair on left foot.   CATARACT EXTRACTION W/PHACO Left 08/10/2018   Procedure: CATARACT EXTRACTION PHACO AND INTRAOCULAR LENS PLACEMENT (IOC)  LEFT DIABETIC;  Surgeon: Nevada Crane, MD;  Location: [facility];  Service: Ophthalmology;  Laterality: Left;  diabetic - diet controlled   CATARACT EXTRACTION W/PHACO Right 11/16/2018   Procedure: CATARACT EXTRACTION PHACO AND INTRAOCULAR LENS PLACEMENT (IOC)  RIGHT DIABETIC;  Surgeon: Nevada Crane, MD;  Location: [facility];  Service: Ophthalmology;  Laterality: Right;  Diabetic - diet controlled   COLONOSCOPY     COLONOSCOPY N/A 11/01/2021   Procedure:  COLONOSCOPY;  Surgeon: Jaynie Collins, DO;  Location: [facility] ENDOSCOPY;  Service: Gastroenterology;  Laterality: N/A;  DIET CONTROLLED   COLONOSCOPY N/A 09/16/2022   Procedure: COLONOSCOPY;  Surgeon: Jaynie Collins, DO;  Location: [facility] ENDOSCOPY;  Service: Gastroenterology;  Laterality: N/A;   COLONOSCOPY WITH PROPOFOL N/A 05/08/2018   Procedure: COLONOSCOPY WITH PROPOFOL;  Surgeon: Christena Deem, MD;  Location: [facility] ENDOSCOPY;  Service: Endoscopy;  Laterality: N/A;   ENDOMETRIAL BIOPSY     EYE SURGERY     FOOT SURGERY Left    for bunionectomy and hammertoe   JOINT REPLACEMENT     TONSILLECTOMY     TOTAL KNEE ARTHROPLASTY Left 04/08/2017   Procedure: TOTAL KNEE ARTHROPLASTY;  Surgeon: Christena Flake, MD;  Location: [facility] ORS;  Service: Orthopedics;  Laterality: Left;   TOTAL KNEE ARTHROPLASTY Right 08/19/2017   Procedure: TOTAL KNEE ARTHROPLASTY;  Surgeon: Christena Flake, MD;  Location: [facility] ORS;  Service: Orthopedics;  Laterality: Right;    SOCIAL HISTORY: Social History   Socioeconomic History   Marital status: Widowed    Spouse name: Not on file   Number of children: 2   Years of  education: Not on file   Highest education level: Not on file  Occupational History   Occupation: Retired  Tobacco Use   Smoking status: Never   Smokeless tobacco: Never  Vaping Use   Vaping Use: Never used  Substance and Sexual Activity   Alcohol use: Yes    Comment: 1-2 drinks every 1-2 months   Drug use: No   Sexual activity: Not on file  Other Topics Concern   Not on file  Social History Narrative   Not on file   Social Determinants of Health   Financial Resource Strain: Not on file  Food Insecurity: Not on file  Transportation Needs: Not on file  Physical Activity: Not on file  Stress: Not on file  Social Connections: Not on file  Intimate Partner Violence: Not on file    FAMILY HISTORY: Family History  Problem Relation Age of Onset   Stroke Mother    Hypertension Mother    Cancer Father    Prostate cancer Brother    Breast cancer Maternal Aunt 71   Breast cancer Cousin        mat cousin   Kidney cancer Neg Hx    Kidney disease Neg Hx     ALLERGIES:  is allergic to colesevelam; oxybutynin; rosuvastatin; and zoster vaccine recombinant, adjuvanted.  MEDICATIONS:  Current Outpatient Medications  Medication Sig Dispense Refill   acetaminophen (TYLENOL) 325 MG tablet Take 1,000 mg by mouth 2 (two) times daily.      allopurinol (ZYLOPRIM) 100 MG tablet      atorvastatin (LIPITOR) 20 MG tablet Take 1 tablet by mouth daily.     Blood Glucose Monitoring Suppl (FIFTY50 GLUCOSE METER 2.0) w/Device KIT Use as directed BAYER CONTOUR NEXT METER DX: E11.9     CONTOUR NEXT TEST test strip      ergocalciferol (VITAMIN D2) 1.25 MG (50000 UT) capsule Take 50,000 Units by mouth once a week.     Fluticasone-Umeclidin-Vilant (TRELEGY ELLIPTA) 100-62.5-25 MCG/INH AEPB Inhale into the lungs as needed.     furosemide (LASIX) 40 MG tablet Take one tablet by mouth once daily as needed for lower extremity edema     gabapentin (NEURONTIN) 300 MG capsule Take 300 mg by mouth at  bedtime.     hydrocortisone (ANUSOL-HC) 2.5 % rectal cream Place 1  application rectally 2 (two) times daily.     magnesium oxide (MAG-OX) 400 MG tablet Take 800 mg by mouth daily.      nystatin cream (MYCOSTATIN) Apply 1 application topically 2 (two) times daily as needed. Apply thin film to yeast rash under the breasts     Omega-3 Krill Oil 500 MG CAPS Take 500 mg by mouth 3 (three) times a week. On Monday Wednesday and Friday     omeprazole (PRILOSEC) 20 MG capsule Take 20 mg by mouth daily. Acid reflux      sennosides-docusate sodium (SENOKOT-S) 8.6-50 MG tablet Take 2 tablets by mouth 2 (two) times daily.      TOPROL XL 25 MG 24 hr tablet Take 25 mg by mouth at bedtime.     triamterene-hydrochlorothiazide (DYAZIDE) 37.5-25 MG capsule Take 1 capsule by mouth daily.     WIXELA INHUB 250-50 MCG/ACT AEPB Inhale 1 puff into the lungs 2 (two) times daily.     solifenacin (VESICARE) 5 MG tablet Take 5 mg by mouth daily. (Patient not taking: Reported on 09/16/2022)     No current facility-administered medications for this visit.     PHYSICAL EXAMINATION: ECOG PERFORMANCE STATUS: 1 - Symptomatic but completely ambulatory Vitals:   10/31/22 1102  BP: (!) 172/79  Pulse: (!) 56  Resp: 18  Temp: (!) 96.1 F (35.6 C)  SpO2: 100%   Filed Weights   10/31/22 1102  Weight: 249 lb 1.6 oz (113 kg)    Physical Exam Constitutional:      General: She is not in acute distress.    Appearance: She is obese.  HENT:     Head: Normocephalic and atraumatic.  Eyes:     General: No scleral icterus.    Pupils: Pupils are equal, round, and reactive to light.  Cardiovascular:     Rate and Rhythm: Normal rate and regular rhythm.     Heart sounds: Normal heart sounds.  Pulmonary:     Effort: Pulmonary effort is normal. No respiratory distress.     Breath sounds: No wheezing.  Abdominal:     General: Bowel sounds are normal. There is no distension.     Palpations: Abdomen is soft. There is no mass.      Tenderness: There is no abdominal tenderness.  Musculoskeletal:        General: No deformity. Normal range of motion.     Cervical back: Normal range of motion and neck supple.     Comments: Bilateral lower extremity trace edema chronic  Skin:    General: Skin is warm and dry.     Findings: No erythema or rash.  Neurological:     Mental Status: She is alert and oriented to person, place, and time. Mental status is at baseline.     Cranial Nerves: No cranial nerve deficit.     Coordination: Coordination normal.  Psychiatric:        Mood and Affect: Mood normal.        Behavior: Behavior normal.        Thought Content: Thought content normal.      LABORATORY DATA:  I have reviewed the data as listed    Latest Ref Rng & Units 10/31/2022   10:39 AM 04/23/2022   10:14 AM 04/20/2021    9:46 AM  CBC  WBC 4.0 - 10.5 K/uL 5.6  5.0  5.8   Hemoglobin 12.0 - 15.0 g/dL 16.1  09.6  04.5   Hematocrit 36.0 - 46.0 %  45.9  45.0  44.1   Platelets 150 - 400 K/uL 173  172  172       Latest Ref Rng & Units 10/31/2022   10:39 AM 04/23/2022   10:14 AM 04/20/2021    9:46 AM  CMP  Glucose 70 - 99 mg/dL 308  657  846   BUN 8 - 23 mg/dL 16  20  20    Creatinine 0.44 - 1.00 mg/dL 9.62  9.52  8.41   Sodium 135 - 145 mmol/L 139  141  139   Potassium 3.5 - 5.1 mmol/L 4.1  3.9  3.9   Chloride 98 - 111 mmol/L 105  110  106   CO2 22 - 32 mmol/L 26  27  28    Calcium 8.9 - 10.3 mg/dL 9.8  9.6  9.6   Total Protein 6.5 - 8.1 g/dL 7.3  7.0  7.0   Total Bilirubin 0.3 - 1.2 mg/dL 0.7  0.5  0.9   Alkaline Phos 38 - 126 U/L 96  88  90   AST 15 - 41 U/L 17  15  17    ALT 0 - 44 U/L 10  9  11       Iron/TIBC/Ferritin/ %Sat No results found for: "IRON", "TIBC", "FERRITIN", "IRONPCTSAT"   RADIOGRAPHIC STUDIES: I have personally reviewed the radiological images as listed and agreed with the findings in the report. MR ABDOMEN MRCP W WO CONTAST  Result Date: 10/31/2022 CLINICAL DATA:  Follow-up of pancreatic  cystic lesion. EXAM: MRI ABDOMEN WITHOUT AND WITH CONTRAST (INCLUDING MRCP) TECHNIQUE: Multiplanar multisequence MR imaging of the abdomen was performed both before and after the administration of intravenous contrast. Heavily T2-weighted images of the biliary and pancreatic ducts were obtained, and three-dimensional MRCP images were rendered by post processing. CONTRAST:  10mL GADAVIST GADOBUTROL 1 MMOL/ML IV SOLN COMPARISON:  08/08/2022 CT.  04/19/2022 MRI FINDINGS: Lower chest: Normal heart size without pericardial or pleural effusion. Hepatobiliary: Normal liver. Normal gallbladder, without biliary ductal dilatation. Pancreas: Bilobed, single septated cystic lesion within the pancreatic tail measures 1.2 x 1.3 by 2.0 cm on 14/4 and 8/3. Compare 1.5 x 1.8 x 2.3 cm when remeasured in a similar fashion on the prior. No suspicious postcontrast characteristics. No main duct dilatation. Spleen: Arterially hyperenhancing, delayed isoenhancing anterior splenic lesion measures 5.6 x 4.8 by 5.3 cm on 25/19 and 14/24. Compare 5.8 x 4.7 by 5.5 cm when remeasured in a similar fashion on the prior. No splenomegaly or perisplenic fluid. Adrenals/Urinary Tract: Normal adrenal glands. Normal right kidney. Upper pole left renal 3.1 cm cyst . In the absence of clinically indicated signs/symptoms require(s) no independent follow-up. No hydronephrosis. Stomach/Bowel: Normal stomach and abdominal bowel loops. Vascular/Lymphatic: Aortic atherosclerosis. No retroperitoneal or retrocrural adenopathy. Other:  No ascites. Musculoskeletal: No acute osseous abnormality. IMPRESSION: 1. Mild decrease in size of a pancreatic tail minimally septated cystic lesion without suspicious characteristics. Per consensus criteria, this warrants follow-up with pre and post contrast abdominal MRI/MRCP at 2 years. This recommendation follows ACR consensus guidelines: Management of Incidental Pancreatic Cysts: A White Paper of the ACR Incidental Findings  Committee. J Am Coll Radiol 2017;14:911-923. 2. 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 3.  No acute abdominal process. 4.  Aortic Atherosclerosis (ICD10-I70.0). Electronically Signed   By: Jeronimo Greaves M.D.   On: 10/31/2022 08:52   MR 3D Recon At Scanner  Result Date: 10/31/2022 CLINICAL DATA:  Follow-up of pancreatic cystic lesion. EXAM: MRI ABDOMEN WITHOUT AND WITH CONTRAST (INCLUDING MRCP) TECHNIQUE: Multiplanar multisequence MR imaging of the abdomen was performed both before and after the administration of intravenous contrast. Heavily T2-weighted images of the biliary and pancreatic ducts were obtained, and three-dimensional MRCP images were rendered by post processing. CONTRAST:  10mL GADAVIST GADOBUTROL 1 MMOL/ML IV SOLN COMPARISON:  08/08/2022 CT.  04/19/2022 MRI FINDINGS: Lower chest: Normal heart size without pericardial or pleural effusion. Hepatobiliary: Normal liver. Normal gallbladder, without biliary ductal dilatation. Pancreas: Bilobed, single septated cystic lesion within the pancreatic tail measures 1.2 x 1.3 by 2.0 cm on 14/4 and 8/3. Compare 1.5 x 1.8 x 2.3 cm when remeasured in a similar fashion on the prior. No suspicious postcontrast characteristics. No main duct dilatation. Spleen: Arterially hyperenhancing, delayed isoenhancing anterior splenic lesion measures 5.6 x 4.8 by 5.3 cm on 25/19 and 14/24. Compare 5.8 x 4.7 by 5.5 cm when remeasured in a similar fashion on the prior. No splenomegaly or perisplenic fluid. Adrenals/Urinary Tract: Normal adrenal glands. Normal right kidney. Upper pole left renal 3.1 cm cyst . In the absence of clinically indicated signs/symptoms require(s) no independent follow-up. No hydronephrosis. Stomach/Bowel: Normal stomach and abdominal bowel loops. Vascular/Lymphatic: Aortic atherosclerosis. No retroperitoneal or retrocrural adenopathy. Other:  No ascites.  Musculoskeletal: No acute osseous abnormality. IMPRESSION: 1. Mild decrease in size of a pancreatic tail minimally septated cystic lesion without suspicious characteristics. Per consensus criteria, this warrants follow-up with pre and post contrast abdominal MRI/MRCP at 2 years. This recommendation follows ACR consensus guidelines: Management of Incidental Pancreatic Cysts: A White Paper of the ACR Incidental Findings Committee. J Am Coll Radiol 2017;14:911-923. 2. 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 3.  No acute abdominal process. 4.  Aortic Atherosclerosis (ICD10-I70.0). Electronically Signed   By: Jeronimo Greaves M.D.   On: 10/31/2022 08:52   MR FOREARM RIGHT WO CONTRAST  Result Date: 10/11/2022 CLINICAL DATA:  Right forearm and thumb pain for the past 3 weeks. EXAM: MRI OF THE RIGHT FOREARM WITHOUT CONTRAST TECHNIQUE: Multiplanar, multisequence MR imaging of the right forearm was performed. No intravenous contrast was administered. COMPARISON:  Right wrist x-rays report dated September 12, 2022. FINDINGS: Bones/Joint/Cartilage No suspicious marrow signal abnormality. Small osseous protuberance arising from the lateral aspect of the distal radial metadiaphysis, communicating with the underlying medullary space, most consistent with a small osteochondroma. No aggressive features. Thin cartilage cap. No acute fracture. Dorsal dislocation of the distal ulna. Radiocarpal, scaphotrapeziotrapezoid, and first [facility] joint degenerative changes. Small distal radioulnar joint effusion. Ligaments Large full-thickness tear of the TFCC articular disc. Muscles and Tendons Flexor and extensor tendons are intact. Abnormal small amount of fluid in the first extensor tendon compartment sheath (series 14, image 19). Soft tissue No fluid collection or hematoma.  No soft tissue mass. IMPRESSION: 1. Mild first extensor compartment  tenosynovitis. 2. Large full-thickness tear of the TFCC articular disc. 3. Dorsal dislocation of the distal ulna. 4. 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 5. Mild wrist osteoarthritis. Electronically Signed   By: Obie Dredge M.D.   On: 10/11/2022 12:38   MM 3D SCREEN BREAST BILATERAL  Result Date: 09/11/2022 CLINICAL DATA:  Screening. EXAM: DIGITAL SCREENING BILATERAL MAMMOGRAM WITH TOMOSYNTHESIS AND CAD TECHNIQUE: Bilateral screening digital craniocaudal and mediolateral oblique mammograms were obtained. Bilateral screening digital breast tomosynthesis was performed. The images were evaluated with computer-aided detection.  COMPARISON:  Previous exam(s). ACR Breast Density Category a: The breasts are almost entirely fatty. FINDINGS: There are no findings suspicious for malignancy. IMPRESSION: No mammographic evidence of malignancy. A result letter of this screening mammogram will be mailed directly to the patient. RECOMMENDATION: Screening mammogram in one year. (Code:[id number]-B-01Y) BI-RADS CATEGORY  1: Negative. Electronically Signed   By: Sande Brothers M.D.   On: 09/11/2022 10:00   CT ABDOMEN PELVIS W WO CONTRAST  Result Date: 08/08/2022 CLINICAL DATA:  Six week history of right lower quadrant pain EXAM: CT ABDOMEN AND PELVIS WITHOUT AND WITH CONTRAST TECHNIQUE: Multidetector CT imaging of the abdomen and pelvis was performed following the standard protocol before and following the bolus administration of intravenous contrast. RADIATION DOSE REDUCTION: This exam was performed according to the departmental dose-optimization program which includes automated exposure control, adjustment of the mA and/or kV according to patient size and/or use of iterative reconstruction technique. CONTRAST:  OMNIPAQUE IOHEXOL 350 MG/ML SOLN COMPARISON:  CT abdomen and pelvis dated 05/25/2019, MR abdomen dated 04/19/2022, CT chest dated 05/02/2022 FINDINGS: Lower chest:  Bilateral lower lobe right-greater-than-left bronchiectasis. Unchanged right middle lobe perifissural nodule (3:9). Subpleural ground-glass opacity in the right lower lobe are new from 05/02/2022 and may reflect sequela of infection/inflammation. No pleural effusion or pneumothorax demonstrated. Atrial enlargement. Coronary artery calcifications. Hepatobiliary: No focal hepatic lesions. No intra or extrahepatic biliary ductal dilation. Normal gallbladder. Pancreas: Decreased size of hypoattenuating lesion within the pancreatic tail measuring 1.5 x 1.1 cm, previously 1.8 x 1.4 cm (remeasured). No main pancreatic ductal dilation. Spleen: Avidly enhancing mass within the lateral spleen measures 5.9 x 4.4 cm, slowly enlarging since 05/25/2018 when it measured 4.9 x 4.2 cm. Adrenals/Urinary Tract: No adrenal nodules. No suspicious renal mass, calculi or hydronephrosis. Left upper pole Bosniak 1 cyst. No specific follow-up imaging recommended. No focal bladder wall thickening. Stomach/Bowel: Normal appearance of the stomach. Linear metallic radiodensity within the cecum (9: 81) may correspond to endoscopy clip placed during recent colonoscopy. No evidence of bowel wall thickening, distention, or inflammatory changes. Sigmoid diverticulosis without acute diverticulitis. Status post appendectomy. Vascular/Lymphatic: Aortic atherosclerosis. No enlarged abdominal or pelvic lymph nodes. Reproductive: No adnexal masses. Other: No free fluid, fluid collection, or free air. Musculoskeletal: No acute or abnormal lytic or blastic osseous lesions. Small fat-containing paraumbilical hernia. IMPRESSION: 1. No acute abdominopelvic abnormality. 2. Linear metallic radiodensity within the cecum may correspond to endoscopy clip placed during recent colonoscopy. 3. Slightly decreased size of hypoattenuating lesion within the pancreatic tail measuring 1.5 x 1.1 cm, previously 1.8 x 1.4 cm (remeasured). 4. 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 5. New subpleural ground-glass opacity in the right lower lobe are new from 05/02/2022 and may reflect sequela of infection/inflammation. 6. Aortic Atherosclerosis (ICD10-I70.0). Coronary artery calcifications. Assessment for potential risk factor modification, dietary therapy or pharmacologic therapy may be warranted, if clinically indicated. Electronically Signed   By: Agustin Cree M.D.   On: 08/08/2022 16:03

## 2022-11-01 LAB — CANCER ANTIGEN 19-9: CA 19-9: 12 U/mL (ref 0–35)

## 2022-11-01 LAB — CEA: CEA: 2.2 ng/mL (ref 0.0–4.7)

## 2023-07-14 ENCOUNTER — Other Ambulatory Visit (INDEPENDENT_AMBULATORY_CARE_PROVIDER_SITE_OTHER): Payer: Self-pay | Admitting: Nurse Practitioner

## 2023-07-14 DIAGNOSIS — I6523 Occlusion and stenosis of bilateral carotid arteries: Secondary | ICD-10-CM

## 2023-07-15 ENCOUNTER — Ambulatory Visit (INDEPENDENT_AMBULATORY_CARE_PROVIDER_SITE_OTHER): Payer: Medicare Other

## 2023-07-15 ENCOUNTER — Ambulatory Visit (INDEPENDENT_AMBULATORY_CARE_PROVIDER_SITE_OTHER): Payer: Medicare Other | Admitting: Vascular Surgery

## 2023-07-15 ENCOUNTER — Encounter (INDEPENDENT_AMBULATORY_CARE_PROVIDER_SITE_OTHER): Payer: Self-pay | Admitting: Vascular Surgery

## 2023-07-15 VITALS — BP 141/59 | HR 61 | Resp 16

## 2023-07-15 DIAGNOSIS — E119 Type 2 diabetes mellitus without complications: Secondary | ICD-10-CM

## 2023-07-15 DIAGNOSIS — E785 Hyperlipidemia, unspecified: Secondary | ICD-10-CM | POA: Diagnosis not present

## 2023-07-15 DIAGNOSIS — I6523 Occlusion and stenosis of bilateral carotid arteries: Secondary | ICD-10-CM | POA: Diagnosis not present

## 2023-07-15 DIAGNOSIS — I10 Essential (primary) hypertension: Secondary | ICD-10-CM

## 2023-07-15 NOTE — Assessment & Plan Note (Signed)
 Her carotid duplex today shows stable velocities in the 40 to 59% range on the right with improvement in the velocities in the left carotid artery now following in the 1 to 39% range.  Continue Lipitor and recommend baby aspirin  daily.  Continue to follow on an annual basis with duplex.

## 2023-07-15 NOTE — Assessment & Plan Note (Signed)
 blood pressure control important in reducing the progression of atherosclerotic disease. On appropriate oral medications.

## 2023-07-15 NOTE — Progress Notes (Signed)
 MRN : [id number]  [name] is a 83 y.o. (August 06, 1940) female who presents with chief complaint of  Chief Complaint  Patient presents with   Follow-up    52yr carotid follow up  .  History of Present Illness: Patient returns in follow-up of her carotid disease.  She has done well since her last visit and denies any focal neurologic symptoms. Specifically, the patient denies amaurosis fugax, speech or swallowing difficulties, or arm or leg weakness or numbness.  Her carotid duplex today shows stable velocities in the 40 to 59% range on the right with improvement in the velocities in the left carotid artery now following in the 1 to 39% range.  Current Outpatient Medications  Medication Sig Dispense Refill   acetaminophen  (TYLENOL ) 325 MG tablet Take 1,000 mg by mouth 2 (two) times daily.      allopurinol (ZYLOPRIM) 100 MG tablet      atorvastatin  (LIPITOR) 20 MG tablet Take 1 tablet by mouth daily.     Blood Glucose Monitoring Suppl (FIFTY50 GLUCOSE METER 2.0) w/Device KIT Use as directed BAYER CONTOUR NEXT METER DX: E11.9     CONTOUR NEXT TEST test strip      ergocalciferol (VITAMIN D2) 1.25 MG (50000 UT) capsule Take 50,000 Units by mouth once a week.     Fluticasone-Umeclidin-Vilant (TRELEGY ELLIPTA) 100-62.5-25 MCG/INH AEPB Inhale into the lungs as needed.     furosemide (LASIX) 40 MG tablet Take one tablet by mouth once daily as needed for lower extremity edema     gabapentin  (NEURONTIN ) 300 MG capsule Take 300 mg by mouth at bedtime.     hydrocortisone (ANUSOL-HC) 2.5 % rectal cream Place 1 application rectally 2 (two) times daily.     magnesium  oxide (MAG-OX) 400 MG tablet Take 800 mg by mouth daily.      nystatin  cream (MYCOSTATIN ) Apply 1 application topically 2 (two) times daily as needed. Apply thin film to yeast rash under the breasts     Omega-3 Krill Oil 500 MG CAPS Take 500 mg by mouth 3 (three) times a week. On Monday Wednesday and Friday     omeprazole (PRILOSEC) 20  MG capsule Take 20 mg by mouth daily. Acid reflux      sennosides-docusate sodium  (SENOKOT-S) 8.6-50 MG tablet Take 2 tablets by mouth 2 (two) times daily.      TOPROL  XL 25 MG 24 hr tablet Take 25 mg by mouth at bedtime.     triamterene-hydrochlorothiazide (DYAZIDE) 37.5-25 MG capsule Take 1 capsule by mouth daily.     WIXELA INHUB 250-50 MCG/ACT AEPB Inhale 1 puff into the lungs 2 (two) times daily.     solifenacin  (VESICARE ) 5 MG tablet Take 5 mg by mouth daily. (Patient not taking: Reported on 09/16/2022)     No current facility-administered medications for this visit.    Past Medical History:  Diagnosis Date   Acquired hammertoe    other   Acquired hammertoe    Angina pectoris, unspecified (HCC)    Angina pectoris, unspecified (HCC)    Arthritis    knees   Breast cancer (HCC) 1990   left breast   Cancer (HCC) 1990,s   left breast   CHF (congestive heart failure) (HCC)    Chronic kidney disease    stage 2   Degenerative joint disease    Dermatophytosis of nail    Diabetes mellitus type 2, uncomplicated (HCC)    diet controlled   Dyspnea    Essential hypertension,  benign    Exostosis    unspecified site   Fracture of right ankle 2010   Followed by Dr. Ashley   GERD (gastroesophageal reflux disease)    Hemorrhoids    Hypercholesterolemia    Hyperlipidemia, unspecified    Hypertension    Lymphedema of leg    left leg worse than right leg.   Obesity, unspecified    Osteoarthrosis involving lower leg    unspecified whether generalized or localized    Pre-diabetes    Pure hypercholesterolemia    Vitamin D deficiency, unspecified    Wears dentures    partial upper    Past Surgical History:  Procedure Laterality Date   BREAST LUMPECTOMY Left 1995   BREAST SURGERY Left 1990   lumpectomy   BUNIONECTOMY Bilateral 1990's   plus hammer toe repair on left foot.   CATARACT EXTRACTION W/PHACO Left 08/10/2018   Procedure: CATARACT EXTRACTION PHACO AND INTRAOCULAR LENS  PLACEMENT (IOC)  LEFT DIABETIC;  Surgeon: Myrna Adine Anes, MD;  Location: [facility];  Service: Ophthalmology;  Laterality: Left;  diabetic - diet controlled   CATARACT EXTRACTION W/PHACO Right 11/16/2018   Procedure: CATARACT EXTRACTION PHACO AND INTRAOCULAR LENS PLACEMENT (IOC)  RIGHT DIABETIC;  Surgeon: Myrna Adine Anes, MD;  Location: [facility];  Service: Ophthalmology;  Laterality: Right;  Diabetic - diet controlled   COLONOSCOPY     COLONOSCOPY N/A 11/01/2021   Procedure: COLONOSCOPY;  Surgeon: Onita Elspeth Sharper, DO;  Location: [facility];  Service: Gastroenterology;  Laterality: N/A;  DIET CONTROLLED   COLONOSCOPY N/A 09/16/2022   Procedure: COLONOSCOPY;  Surgeon: Onita Elspeth Sharper, DO;  Location: [facility];  Service: Gastroenterology;  Laterality: N/A;   COLONOSCOPY WITH PROPOFOL  N/A 05/08/2018   Procedure: COLONOSCOPY WITH PROPOFOL ;  Surgeon: Gaylyn Gladis PENNER, MD;  Location: [facility];  Service: Endoscopy;  Laterality: N/A;   ENDOMETRIAL BIOPSY     EYE SURGERY     FOOT SURGERY Left    for bunionectomy and hammertoe   JOINT REPLACEMENT     TONSILLECTOMY     TOTAL KNEE ARTHROPLASTY Left 04/08/2017   Procedure: TOTAL KNEE ARTHROPLASTY;  Surgeon: Edie Norleen PARAS, MD;  Location: [facility];  Service: Orthopedics;  Laterality: Left;   TOTAL KNEE ARTHROPLASTY Right 08/19/2017   Procedure: TOTAL KNEE ARTHROPLASTY;  Surgeon: Edie Norleen PARAS, MD;  Location: [facility];  Service: Orthopedics;  Laterality: Right;     Social History   Tobacco Use   Smoking status: Never   Smokeless tobacco: Never  Vaping Use   Vaping status: Never Used  Substance Use Topics   Alcohol use: Yes    Comment: 1-2 drinks every 1-2 months   Drug use: No       Family History  Problem Relation Age of Onset   Stroke Mother    Hypertension Mother    Cancer Father    Prostate cancer Brother    Breast cancer Maternal Aunt 77   Breast cancer Cousin        mat cousin    Kidney cancer Neg Hx    Kidney disease Neg Hx      Allergies  Allergen Reactions   Colesevelam Other (See Comments)    constipation   Oxybutynin Swelling    Leg swelling   Rosuvastatin Other (See Comments)    Pain in lower extremities   Zoster Vaccine Recombinant, Adjuvanted Hives     REVIEW OF SYSTEMS (Negative unless checked)  Constitutional: [] Weight loss  [] Fever  [] Chills  Cardiac: [] Chest pain   [] Chest pressure   [] Palpitations   [] Shortness of breath when laying flat   [] Shortness of breath at rest   [] Shortness of breath with exertion. Vascular:  [] Pain in legs with walking   [] Pain in legs at rest   [] Pain in legs when laying flat   [] Claudication   [] Pain in feet when walking  [] Pain in feet at rest  [] Pain in feet when laying flat   [] History of DVT   [] Phlebitis   [] Swelling in legs   [] Varicose veins   [] Non-healing ulcers Pulmonary:   [] Uses home oxygen   [] Productive cough   [] Hemoptysis   [] Wheeze  [] COPD   [] Asthma Neurologic:  [] Dizziness  [] Blackouts   [] Seizures   [] History of stroke   [] History of TIA  [] Aphasia   [] Temporary blindness   [] Dysphagia   [] Weakness or numbness in arms   [] Weakness or numbness in legs Musculoskeletal:  [x] Arthritis   [] Joint swelling   [x] Joint pain   [] Low back pain Hematologic:  [] Easy bruising  [] Easy bleeding   [] Hypercoagulable state   [] Anemic  [] Hepatitis Gastrointestinal:  [] Blood in stool   [] Vomiting blood  [] Gastroesophageal reflux/heartburn   [] Difficulty swallowing. Genitourinary:  [] Chronic kidney disease   [] Difficult urination  [] Frequent urination  [] Burning with urination   [] Blood in urine Skin:  [] Rashes   [] Ulcers   [] Wounds Psychological:  [] History of anxiety   []  History of major depression.  Physical Examination  Vitals:   07/15/23 1417  BP: (!) 141/59  Pulse: 61  Resp: 16   There is no height or weight on file to calculate BMI. Gen:  WD/WN, NAD Head: [place]/AT, No temporalis wasting. Ear/Nose/Throat:  Hearing grossly intact, nares w/o erythema or drainage, trachea midline Eyes: Conjunctiva clear. Sclera non-icteric Neck: Supple.  No bruit  Pulmonary:  Good air movement, equal and clear to auscultation bilaterally.  Cardiac: RRR, No JVD Vascular:  Vessel Right Left  Radial Palpable Palpable           Musculoskeletal: M/S 5/5 throughout.  No deformity or atrophy. Mild LE edema. Neurologic: CN 2-12 intact. Sensation grossly intact in extremities.  Symmetrical.  Speech is fluent. Motor exam as listed above. Psychiatric: Judgment intact, Mood & affect appropriate for pt's clinical situation. Dermatologic: No rashes or ulcers noted.  No cellulitis or open wounds. Lymph : No Cervical, Axillary, or Inguinal lymphadenopathy.    CBC Lab Results  Component Value Date   WBC 5.6 10/31/2022   HGB 15.1 (H) 10/31/2022   HCT 45.9 10/31/2022   MCV 91.6 10/31/2022   PLT 173 10/31/2022    BMET    Component Value Date/Time   NA 139 10/31/2022 1039   K 4.1 10/31/2022 1039   CL 105 10/31/2022 1039   CO2 26 10/31/2022 1039   GLUCOSE 111 (H) 10/31/2022 1039   BUN 16 10/31/2022 1039   CREATININE 0.96 10/31/2022 1039   CALCIUM  9.8 10/31/2022 1039   GFRNONAA 59 (L) 10/31/2022 1039   GFRAA >60 08/22/2017 0601   CrCl cannot be calculated (Patient's most recent lab result is older than the maximum 21 days allowed.).  COAG Lab Results  Component Value Date   INR 1.00 08/08/2017   INR 1.01 03/12/2017    Radiology No results found.   Assessment/Plan Carotid stenosis Her carotid duplex today shows stable velocities in the 40 to 59% range on the right with improvement in the velocities in the left carotid artery now following in the 1  to 39% range.  Continue Lipitor and recommend baby aspirin  daily.  Continue to follow on an annual basis with duplex.  Essential hypertension blood pressure control important in reducing the progression of atherosclerotic disease. On appropriate oral  medications.   Type 2 diabetes, diet controlled (HCC) blood glucose control important in reducing the progression of atherosclerotic disease. Also, involved in wound healing.    Hyperlipidemia lipid control important in reducing the progression of atherosclerotic disease. Continue statin therapy    Selinda Gu, MD  07/15/2023 2:48 PM    This note was created with Dragon medical transcription system.  Any errors from dictation are purely unintentional

## 2023-07-15 NOTE — Assessment & Plan Note (Signed)
blood glucose control important in reducing the progression of atherosclerotic disease. Also, involved in wound healing.

## 2023-07-15 NOTE — Assessment & Plan Note (Signed)
 lipid control important in reducing the progression of atherosclerotic disease. Continue statin therapy

## 2023-07-18 ENCOUNTER — Telehealth (INDEPENDENT_AMBULATORY_CARE_PROVIDER_SITE_OTHER): Payer: Self-pay

## 2023-07-18 NOTE — Telephone Encounter (Signed)
Patient called stating that Dr.Hande told her yesterday to see Dr. 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   Please advise

## 2023-07-18 NOTE — Telephone Encounter (Signed)
She can come for evaluation, no studies

## 2023-08-01 ENCOUNTER — Encounter: Payer: Medicare Other | Attending: Physician Assistant | Admitting: Physician Assistant

## 2023-08-01 ENCOUNTER — Encounter (INDEPENDENT_AMBULATORY_CARE_PROVIDER_SITE_OTHER): Payer: Self-pay

## 2023-08-01 ENCOUNTER — Ambulatory Visit (INDEPENDENT_AMBULATORY_CARE_PROVIDER_SITE_OTHER): Payer: Medicare Other | Admitting: Nurse Practitioner

## 2023-08-01 ENCOUNTER — Ambulatory Visit: Payer: Medicare Other | Admitting: Physician Assistant

## 2023-08-01 DIAGNOSIS — L97322 Non-pressure chronic ulcer of left ankle with fat layer exposed: Secondary | ICD-10-CM | POA: Insufficient documentation

## 2023-08-01 DIAGNOSIS — I890 Lymphedema, not elsewhere classified: Secondary | ICD-10-CM | POA: Diagnosis not present

## 2023-08-01 DIAGNOSIS — N183 Chronic kidney disease, stage 3 unspecified: Secondary | ICD-10-CM | POA: Insufficient documentation

## 2023-08-01 DIAGNOSIS — E11622 Type 2 diabetes mellitus with other skin ulcer: Secondary | ICD-10-CM | POA: Diagnosis present

## 2023-08-01 DIAGNOSIS — I5042 Chronic combined systolic (congestive) and diastolic (congestive) heart failure: Secondary | ICD-10-CM | POA: Insufficient documentation

## 2023-08-01 DIAGNOSIS — E1122 Type 2 diabetes mellitus with diabetic chronic kidney disease: Secondary | ICD-10-CM | POA: Diagnosis not present

## 2023-08-01 DIAGNOSIS — I130 Hypertensive heart and chronic kidney disease with heart failure and stage 1 through stage 4 chronic kidney disease, or unspecified chronic kidney disease: Secondary | ICD-10-CM | POA: Diagnosis not present

## 2023-08-08 ENCOUNTER — Encounter: Payer: Medicare Other | Attending: Physician Assistant | Admitting: Physician Assistant

## 2023-08-08 DIAGNOSIS — E11622 Type 2 diabetes mellitus with other skin ulcer: Secondary | ICD-10-CM | POA: Insufficient documentation

## 2023-08-08 DIAGNOSIS — L97322 Non-pressure chronic ulcer of left ankle with fat layer exposed: Secondary | ICD-10-CM | POA: Insufficient documentation

## 2023-08-08 DIAGNOSIS — I5042 Chronic combined systolic (congestive) and diastolic (congestive) heart failure: Secondary | ICD-10-CM | POA: Insufficient documentation

## 2023-08-08 DIAGNOSIS — I130 Hypertensive heart and chronic kidney disease with heart failure and stage 1 through stage 4 chronic kidney disease, or unspecified chronic kidney disease: Secondary | ICD-10-CM | POA: Insufficient documentation

## 2023-08-08 DIAGNOSIS — N183 Chronic kidney disease, stage 3 unspecified: Secondary | ICD-10-CM | POA: Insufficient documentation

## 2023-08-12 ENCOUNTER — Ambulatory Visit (INDEPENDENT_AMBULATORY_CARE_PROVIDER_SITE_OTHER): Payer: Medicare Other | Admitting: Vascular Surgery

## 2023-08-20 ENCOUNTER — Encounter: Payer: Medicare Other | Admitting: Physician Assistant

## 2023-08-20 DIAGNOSIS — E11622 Type 2 diabetes mellitus with other skin ulcer: Secondary | ICD-10-CM | POA: Diagnosis not present

## 2023-09-01 ENCOUNTER — Encounter: Payer: Medicare Other | Attending: Physician Assistant | Admitting: Physician Assistant

## 2023-09-01 DIAGNOSIS — E1122 Type 2 diabetes mellitus with diabetic chronic kidney disease: Secondary | ICD-10-CM | POA: Insufficient documentation

## 2023-09-01 DIAGNOSIS — I890 Lymphedema, not elsewhere classified: Secondary | ICD-10-CM | POA: Insufficient documentation

## 2023-09-01 DIAGNOSIS — Z872 Personal history of diseases of the skin and subcutaneous tissue: Secondary | ICD-10-CM | POA: Diagnosis not present

## 2023-09-01 DIAGNOSIS — I130 Hypertensive heart and chronic kidney disease with heart failure and stage 1 through stage 4 chronic kidney disease, or unspecified chronic kidney disease: Secondary | ICD-10-CM | POA: Insufficient documentation

## 2023-09-01 DIAGNOSIS — N183 Chronic kidney disease, stage 3 unspecified: Secondary | ICD-10-CM | POA: Diagnosis not present

## 2023-09-01 DIAGNOSIS — I5042 Chronic combined systolic (congestive) and diastolic (congestive) heart failure: Secondary | ICD-10-CM | POA: Insufficient documentation

## 2023-09-01 DIAGNOSIS — Z09 Encounter for follow-up examination after completed treatment for conditions other than malignant neoplasm: Secondary | ICD-10-CM | POA: Diagnosis present

## 2023-09-15 ENCOUNTER — Ambulatory Visit: Admitting: Physician Assistant

## 2023-09-23 ENCOUNTER — Other Ambulatory Visit: Payer: Self-pay | Admitting: Internal Medicine

## 2023-09-23 DIAGNOSIS — Z1231 Encounter for screening mammogram for malignant neoplasm of breast: Secondary | ICD-10-CM

## 2023-09-25 ENCOUNTER — Ambulatory Visit
Admission: RE | Admit: 2023-09-25 | Discharge: 2023-09-25 | Disposition: A | Discharge status: Home / Self Care | Source: Ambulatory Visit | Attending: Internal Medicine | Admitting: Internal Medicine

## 2023-09-25 DIAGNOSIS — Z1231 Encounter for screening mammogram for malignant neoplasm of breast: Secondary | ICD-10-CM | POA: Diagnosis present

## 2023-09-30 ENCOUNTER — Other Ambulatory Visit (INDEPENDENT_AMBULATORY_CARE_PROVIDER_SITE_OTHER): Payer: Self-pay | Admitting: Nurse Practitioner

## 2023-09-30 DIAGNOSIS — L97929 Non-pressure chronic ulcer of unspecified part of left lower leg with unspecified severity: Secondary | ICD-10-CM

## 2023-10-01 ENCOUNTER — Encounter (INDEPENDENT_AMBULATORY_CARE_PROVIDER_SITE_OTHER): Payer: TRICARE For Life (TFL)

## 2023-10-01 ENCOUNTER — Encounter (INDEPENDENT_AMBULATORY_CARE_PROVIDER_SITE_OTHER): Payer: Medicare Other

## 2023-10-01 ENCOUNTER — Ambulatory Visit (INDEPENDENT_AMBULATORY_CARE_PROVIDER_SITE_OTHER): Payer: TRICARE For Life (TFL) | Admitting: Nurse Practitioner

## 2023-10-01 ENCOUNTER — Encounter: Attending: Physician Assistant | Admitting: Physician Assistant

## 2023-10-01 DIAGNOSIS — E11622 Type 2 diabetes mellitus with other skin ulcer: Secondary | ICD-10-CM | POA: Diagnosis present

## 2023-10-01 DIAGNOSIS — L97812 Non-pressure chronic ulcer of other part of right lower leg with fat layer exposed: Secondary | ICD-10-CM | POA: Insufficient documentation

## 2023-10-01 DIAGNOSIS — I130 Hypertensive heart and chronic kidney disease with heart failure and stage 1 through stage 4 chronic kidney disease, or unspecified chronic kidney disease: Secondary | ICD-10-CM | POA: Diagnosis not present

## 2023-10-01 DIAGNOSIS — I5042 Chronic combined systolic (congestive) and diastolic (congestive) heart failure: Secondary | ICD-10-CM | POA: Insufficient documentation

## 2023-10-01 DIAGNOSIS — L97822 Non-pressure chronic ulcer of other part of left lower leg with fat layer exposed: Secondary | ICD-10-CM | POA: Diagnosis not present

## 2023-10-01 DIAGNOSIS — E1122 Type 2 diabetes mellitus with diabetic chronic kidney disease: Secondary | ICD-10-CM | POA: Diagnosis not present

## 2023-10-01 DIAGNOSIS — N183 Chronic kidney disease, stage 3 unspecified: Secondary | ICD-10-CM | POA: Diagnosis not present

## 2023-10-01 DIAGNOSIS — I890 Lymphedema, not elsewhere classified: Secondary | ICD-10-CM | POA: Insufficient documentation

## 2023-10-08 ENCOUNTER — Encounter: Admitting: Physician Assistant

## 2023-10-08 DIAGNOSIS — E11622 Type 2 diabetes mellitus with other skin ulcer: Secondary | ICD-10-CM | POA: Diagnosis not present

## 2023-10-13 ENCOUNTER — Encounter

## 2023-10-13 DIAGNOSIS — E11622 Type 2 diabetes mellitus with other skin ulcer: Secondary | ICD-10-CM | POA: Diagnosis not present

## 2023-10-16 ENCOUNTER — Encounter: Admitting: Physician Assistant

## 2023-10-16 DIAGNOSIS — E11622 Type 2 diabetes mellitus with other skin ulcer: Secondary | ICD-10-CM | POA: Diagnosis not present

## 2023-10-21 ENCOUNTER — Encounter

## 2023-10-21 DIAGNOSIS — E11622 Type 2 diabetes mellitus with other skin ulcer: Secondary | ICD-10-CM | POA: Diagnosis not present

## 2023-10-24 ENCOUNTER — Encounter: Admitting: Physician Assistant

## 2023-10-24 DIAGNOSIS — E11622 Type 2 diabetes mellitus with other skin ulcer: Secondary | ICD-10-CM | POA: Diagnosis not present

## 2023-10-28 ENCOUNTER — Encounter

## 2023-10-28 DIAGNOSIS — E11622 Type 2 diabetes mellitus with other skin ulcer: Secondary | ICD-10-CM | POA: Diagnosis not present

## 2023-10-29 ENCOUNTER — Ambulatory Visit: Admitting: Physician Assistant

## 2023-10-31 ENCOUNTER — Ambulatory Visit
Admission: RE | Admit: 2023-10-31 | Discharge: 2023-10-31 | Disposition: A | Discharge status: Home / Self Care | Payer: Medicare Other | Source: Ambulatory Visit | Attending: Oncology

## 2023-10-31 ENCOUNTER — Encounter: Attending: Physician Assistant | Admitting: Physician Assistant

## 2023-10-31 ENCOUNTER — Other Ambulatory Visit: Payer: Self-pay | Admitting: Oncology

## 2023-10-31 DIAGNOSIS — E11622 Type 2 diabetes mellitus with other skin ulcer: Secondary | ICD-10-CM | POA: Diagnosis present

## 2023-10-31 DIAGNOSIS — I5042 Chronic combined systolic (congestive) and diastolic (congestive) heart failure: Secondary | ICD-10-CM | POA: Insufficient documentation

## 2023-10-31 DIAGNOSIS — L97822 Non-pressure chronic ulcer of other part of left lower leg with fat layer exposed: Secondary | ICD-10-CM | POA: Diagnosis not present

## 2023-10-31 DIAGNOSIS — I890 Lymphedema, not elsewhere classified: Secondary | ICD-10-CM | POA: Insufficient documentation

## 2023-10-31 DIAGNOSIS — I130 Hypertensive heart and chronic kidney disease with heart failure and stage 1 through stage 4 chronic kidney disease, or unspecified chronic kidney disease: Secondary | ICD-10-CM | POA: Insufficient documentation

## 2023-10-31 DIAGNOSIS — K862 Cyst of pancreas: Secondary | ICD-10-CM | POA: Diagnosis present

## 2023-10-31 DIAGNOSIS — N183 Chronic kidney disease, stage 3 unspecified: Secondary | ICD-10-CM | POA: Diagnosis not present

## 2023-10-31 DIAGNOSIS — L97812 Non-pressure chronic ulcer of other part of right lower leg with fat layer exposed: Secondary | ICD-10-CM | POA: Diagnosis not present

## 2023-10-31 DIAGNOSIS — E1122 Type 2 diabetes mellitus with diabetic chronic kidney disease: Secondary | ICD-10-CM | POA: Insufficient documentation

## 2023-10-31 MED ORDER — GADOBUTROL 1 MMOL/ML IV SOLN
10.0000 mL | Freq: Once | INTRAVENOUS | Status: AC | PRN
Start: 2023-10-31 — End: 2023-10-31
  Administered 2023-10-31: 10 mL via INTRAVENOUS

## 2023-11-04 ENCOUNTER — Encounter

## 2023-11-04 DIAGNOSIS — E11622 Type 2 diabetes mellitus with other skin ulcer: Secondary | ICD-10-CM | POA: Diagnosis not present

## 2023-11-05 ENCOUNTER — Other Ambulatory Visit: Payer: Self-pay

## 2023-11-05 ENCOUNTER — Other Ambulatory Visit: Payer: Self-pay | Admitting: Gastroenterology

## 2023-11-05 DIAGNOSIS — R634 Abnormal weight loss: Secondary | ICD-10-CM

## 2023-11-05 DIAGNOSIS — Z860101 Personal history of adenomatous and serrated colon polyps: Secondary | ICD-10-CM

## 2023-11-05 DIAGNOSIS — R978 Other abnormal tumor markers: Secondary | ICD-10-CM

## 2023-11-05 DIAGNOSIS — Z08 Encounter for follow-up examination after completed treatment for malignant neoplasm: Secondary | ICD-10-CM

## 2023-11-05 DIAGNOSIS — K5900 Constipation, unspecified: Secondary | ICD-10-CM

## 2023-11-05 DIAGNOSIS — R1031 Right lower quadrant pain: Secondary | ICD-10-CM

## 2023-11-05 DIAGNOSIS — D7389 Other diseases of spleen: Secondary | ICD-10-CM

## 2023-11-06 ENCOUNTER — Inpatient Hospital Stay: Payer: Medicare Other | Admitting: Oncology

## 2023-11-06 ENCOUNTER — Inpatient Hospital Stay: Payer: Medicare Other | Attending: Oncology

## 2023-11-06 ENCOUNTER — Ambulatory Visit: Payer: Medicare Other | Admitting: Oncology

## 2023-11-06 ENCOUNTER — Telehealth: Payer: Self-pay | Admitting: *Deleted

## 2023-11-06 ENCOUNTER — Other Ambulatory Visit: Payer: Medicare Other

## 2023-11-06 DIAGNOSIS — D7389 Other diseases of spleen: Secondary | ICD-10-CM | POA: Insufficient documentation

## 2023-11-06 DIAGNOSIS — R911 Solitary pulmonary nodule: Secondary | ICD-10-CM | POA: Insufficient documentation

## 2023-11-06 DIAGNOSIS — R978 Other abnormal tumor markers: Secondary | ICD-10-CM | POA: Insufficient documentation

## 2023-11-06 DIAGNOSIS — K862 Cyst of pancreas: Secondary | ICD-10-CM | POA: Insufficient documentation

## 2023-11-06 DIAGNOSIS — Z8042 Family history of malignant neoplasm of prostate: Secondary | ICD-10-CM | POA: Insufficient documentation

## 2023-11-06 DIAGNOSIS — Z803 Family history of malignant neoplasm of breast: Secondary | ICD-10-CM | POA: Insufficient documentation

## 2023-11-06 NOTE — Telephone Encounter (Signed)
 Patient wants to have somebody to call her so that she can get her appointment re done

## 2023-11-07 ENCOUNTER — Encounter: Admitting: Physician Assistant

## 2023-11-07 ENCOUNTER — Ambulatory Visit: Payer: Medicare Other | Admitting: Oncology

## 2023-11-07 ENCOUNTER — Other Ambulatory Visit: Payer: Medicare Other

## 2023-11-07 DIAGNOSIS — E11622 Type 2 diabetes mellitus with other skin ulcer: Secondary | ICD-10-CM | POA: Diagnosis not present

## 2023-11-07 NOTE — Telephone Encounter (Signed)
Please contact pt to r/s appts

## 2023-11-13 ENCOUNTER — Encounter: Payer: Self-pay | Admitting: Gastroenterology

## 2023-11-14 ENCOUNTER — Ambulatory Visit: Admitting: Physician Assistant

## 2023-11-17 ENCOUNTER — Encounter: Admitting: Physician Assistant

## 2023-11-17 DIAGNOSIS — E11622 Type 2 diabetes mellitus with other skin ulcer: Secondary | ICD-10-CM | POA: Diagnosis not present

## 2023-11-18 ENCOUNTER — Inpatient Hospital Stay

## 2023-11-18 ENCOUNTER — Encounter (INDEPENDENT_AMBULATORY_CARE_PROVIDER_SITE_OTHER): Payer: Self-pay

## 2023-11-18 ENCOUNTER — Inpatient Hospital Stay (HOSPITAL_BASED_OUTPATIENT_CLINIC_OR_DEPARTMENT_OTHER): Admitting: Oncology

## 2023-11-18 ENCOUNTER — Ambulatory Visit: Admitting: Physician Assistant

## 2023-11-18 ENCOUNTER — Encounter: Payer: Self-pay | Admitting: Oncology

## 2023-11-18 VITALS — BP 147/57 | HR 51 | Temp 97.9°F | Resp 16 | Wt 247.0 lb

## 2023-11-18 DIAGNOSIS — R978 Other abnormal tumor markers: Secondary | ICD-10-CM | POA: Diagnosis not present

## 2023-11-18 DIAGNOSIS — D7389 Other diseases of spleen: Secondary | ICD-10-CM

## 2023-11-18 DIAGNOSIS — Z803 Family history of malignant neoplasm of breast: Secondary | ICD-10-CM | POA: Diagnosis not present

## 2023-11-18 DIAGNOSIS — R911 Solitary pulmonary nodule: Secondary | ICD-10-CM

## 2023-11-18 DIAGNOSIS — Z08 Encounter for follow-up examination after completed treatment for malignant neoplasm: Secondary | ICD-10-CM

## 2023-11-18 DIAGNOSIS — K862 Cyst of pancreas: Secondary | ICD-10-CM | POA: Diagnosis not present

## 2023-11-18 DIAGNOSIS — Z8042 Family history of malignant neoplasm of prostate: Secondary | ICD-10-CM | POA: Diagnosis not present

## 2023-11-18 LAB — COMPREHENSIVE METABOLIC PANEL WITH GFR
ALT: 13 U/L (ref 0–44)
AST: 15 U/L (ref 15–41)
Albumin: 3.5 g/dL (ref 3.5–5.0)
Alkaline Phosphatase: 86 U/L (ref 38–126)
Anion gap: 5 (ref 5–15)
BUN: 17 mg/dL (ref 8–23)
CO2: 26 mmol/L (ref 22–32)
Calcium: 9.6 mg/dL (ref 8.9–10.3)
Chloride: 107 mmol/L (ref 98–111)
Creatinine, Ser: 1 mg/dL (ref 0.44–1.00)
GFR, Estimated: 56 mL/min — ABNORMAL LOW (ref >60)
Glucose, Bld: 122 mg/dL — ABNORMAL HIGH (ref 70–99)
Potassium: 4.1 mmol/L (ref 3.5–5.1)
Sodium: 138 mmol/L (ref 135–145)
Total Bilirubin: 0.8 mg/dL (ref 0.0–1.2)
Total Protein: 6.7 g/dL (ref 6.5–8.1)

## 2023-11-18 LAB — CBC WITH DIFFERENTIAL/PLATELET
Abs Immature Granulocytes: 0.02 10*3/uL (ref 0.00–0.07)
Basophils Absolute: 0 10*3/uL (ref 0.0–0.1)
Basophils Relative: 0 %
Eosinophils Absolute: 0.1 10*3/uL (ref 0.0–0.5)
Eosinophils Relative: 1 %
HCT: 40.7 % (ref 36.0–46.0)
Hemoglobin: 13.3 g/dL (ref 12.0–15.0)
Immature Granulocytes: 0 %
Lymphocytes Relative: 29 %
Lymphs Abs: 1.5 10*3/uL (ref 0.7–4.0)
MCH: 30.4 pg (ref 26.0–34.0)
MCHC: 32.7 g/dL (ref 30.0–36.0)
MCV: 93.1 fL (ref 80.0–100.0)
Monocytes Absolute: 0.4 10*3/uL (ref 0.1–1.0)
Monocytes Relative: 8 %
Neutro Abs: 3.2 10*3/uL (ref 1.7–7.7)
Neutrophils Relative %: 62 %
Platelets: 156 10*3/uL (ref 150–400)
RBC: 4.37 MIL/uL (ref 3.87–5.11)
RDW: 14 % (ref 11.5–15.5)
WBC: 5.2 10*3/uL (ref 4.0–10.5)
nRBC: 0 % (ref 0.0–0.2)

## 2023-11-18 NOTE — Progress Notes (Signed)
 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 Burdette Carolin.  Patient elected to continue surveillance. Recent MRI abdomen MRCP showed decreased size of the cyst.  Given the stable size and patient's age, recommend to stop surveillance. She agrees with the plan.  CEA and CA 19-9 have been monitored, levels are pending at the time of dictation.  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  This lesion was FDG negative on previous PET scan.  Lung nodule 04/23/2018 which showed lung nodules  I have ordered CT chest multiple times and patient has not had it done yet.   She has upcoming CT chest abdomen pelvis ordered by GI.   No orders of the defined types were placed in this encounter.  Follow-up PRN All questions were answered. The patient knows to call the clinic with any problems, questions or concerns.  Wanda Forbes, MD, PhD [facility] Hematology Oncology 11/18/2023   REASON FOR VISIT:  Follow-up for pancreatic cyst, lung nodules  HISTORY OF PRESENTING ILLNESS:  [name] is a  83 y.o.  female presents for follow up of pancreatic cyst. lung nodules  CT chest showed bilateral bronchiectasis, lung nodules and thyroid nodules.. Incidental findings of ?kidney mass, not reported on radiologist. Subsequently CT abdomen pelvis was done which showed 2.3 lateral left upper pole renal cyst. Incidental finding of 4.9 cm rounded enhancing lesion in the anterior spleen, now from previous study in 2010. Patient was referred to [facility] for further evaluation.   #  seen by me on 07/03/2018.  At that time patient wants to be  referred to [facility] oncology for further evaluation.  Patient called and requested another appointment for discussion of her CT scan that was done in November 2020.  09/15/2018 PET scan at [facility] showed no focal lesions are discernible in the spleen. NO other hypermetabolic activity in chest abdomen pelvis to suggest malignancy.  Patient had a follow up CT done on 05/25/2019 CT abdomen pelvis w contrast showed indeterminate splenic lesion has no interval changes.  14mm low attenudating lesion from the tai of the pancrease similar to prior CT, likely a side branch IPMN. Colonic diverticulosis  # 11/01/2019, patient had surgery evaluation by Dr. Burdette Carolin at [facility].  Patient was recommended to either proceed with endoscopy ultrasound/fine-needle aspiration or reimage in 6 months time by pancreatic protocol MRI was recommended.  Decision was made to continue follow-up with interval images and tumor markers.  #04/16/2021, MRI MRCP of the abdomen showed a stable size of the cystic lesion in the tail of the pancreas.  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    INTERVAL HISTORY [name] is a 83 y.o. female who has above history reviewed by me today presents for follow up visit for pancreatic cyst. Patient has had MRI MRCP abdomen done.   Patient reports feeling well.  has no new concerns. She was recently seen by GI, reported weight loss of 30 pounds. Also she has lower abdominal pain. Constipated.  She was recommended to get CT chest abdomen pelvis done.  Patient weighed 249 pounds at our clinic 1 year ago. Today she weighed 247 pounds.  Review of Systems  Constitutional:  Negative for appetite change, chills, fatigue, fever and unexpected weight change.  HENT:   Negative for hearing loss and voice change.   Eyes:  Negative for eye problems.  Respiratory:  Negative for chest tightness and cough.   Cardiovascular:  Positive for leg swelling. Negative for chest pain.   Gastrointestinal:  Positive for abdominal pain. Negative for abdominal distention and blood in stool.  Endocrine: Negative for hot flashes.  Genitourinary:  Negative for difficulty urinating and frequency.   Musculoskeletal:  Positive for back pain. Negative for arthralgias.  Skin:  Negative for itching and rash.  Neurological:  Negative for extremity weakness.  Hematological:  Negative for adenopathy.  Psychiatric/Behavioral:  Negative for confusion.     MEDICAL HISTORY:  Past Medical History:  Diagnosis Date   Acquired hammertoe    other   Acquired hammertoe    Angina pectoris, unspecified (HCC)    Angina pectoris, unspecified (HCC)    Arthritis    knees   Breast cancer (HCC) 1990   left breast   Cancer (HCC) 1990,s   left breast   CHF (congestive heart failure) (HCC)    Chronic kidney disease    stage 2   Degenerative joint disease    Dermatophytosis of nail    Diabetes mellitus type 2, uncomplicated (HCC)    diet controlled   Dyspnea    Essential hypertension, benign    Exostosis    unspecified site   Fracture of right ankle 2010   Followed by Dr. Althea Atkinson   GERD (gastroesophageal reflux disease)    Hemorrhoids    Hypercholesterolemia    Hyperlipidemia, unspecified    Hypertension    Lymphedema of leg    left leg worse than right leg.   Obesity, unspecified    Osteoarthrosis involving lower leg    unspecified whether generalized or localized    Pre-diabetes    Pure hypercholesterolemia    Vitamin D deficiency, unspecified    Wears dentures    partial upper    SURGICAL HISTORY: Past Surgical History:  Procedure Laterality Date   BREAST LUMPECTOMY Left 1995   BREAST SURGERY Left 1990   lumpectomy   BUNIONECTOMY Bilateral 1990's   plus hammer toe repair on left foot.   CATARACT EXTRACTION W/PHACO Left 08/10/2018   Procedure: CATARACT EXTRACTION PHACO AND INTRAOCULAR LENS PLACEMENT (IOC)  LEFT DIABETIC;  Surgeon: Rosa College, MD;  Location: [facility]  SUR[facility]ERY [facility];  Service: Ophthalmology;  Laterality: Left;  diabetic - diet controlled   CATARACT EXTRACTION W/PHACO Right 11/16/2018   Procedure: CATARACT EXTRACTION PHACO AND INTRAOCULAR LENS PLACEMENT (IOC)  RIGHT DIABETIC;  Surgeon: Rosa College, MD;  Location: [facility];  Service: Ophthalmology;  Laterality: Right;  Diabetic - diet controlled   COLONOSCOPY     COLONOSCOPY N/A 11/01/2021   Procedure: COLONOSCOPY;  Surgeon: Quintin Buckle, DO;  Location: [facility] ENDOSCOPY;  Service: Gastroenterology;  Laterality: N/A;  DIET CONTROLLED   COLONOSCOPY N/A 09/16/2022   Procedure: COLONOSCOPY;  Surgeon: Quintin Buckle, DO;  Location: [facility] ENDOSCOPY;  Service: Gastroenterology;  Laterality: N/A;   COLONOSCOPY WITH PROPOFOL  N/A 05/08/2018   Procedure: COLONOSCOPY WITH PROPOFOL ;  Surgeon: Deveron Fly, MD;  Location: [facility] ENDOSCOPY;  Service: Endoscopy;  Laterality: N/A;   ENDOMETRIAL BIOPSY     EYE SURGERY     FOOT SURGERY Left    for bunionectomy and hammertoe   JOINT REPLACEMENT     TONSILLECTOMY  TOTAL KNEE ARTHROPLASTY Left 04/08/2017   Procedure: TOTAL KNEE ARTHROPLASTY;  Surgeon: Elner Hahn, MD;  Location: [facility] ORS;  Service: Orthopedics;  Laterality: Left;   TOTAL KNEE ARTHROPLASTY Right 08/19/2017   Procedure: TOTAL KNEE ARTHROPLASTY;  Surgeon: Elner Hahn, MD;  Location: [facility] ORS;  Service: Orthopedics;  Laterality: Right;    SOCIAL HISTORY: Social History   Socioeconomic History   Marital status: Widowed    Spouse name: Not on file   Number of children: 2   Years of education: Not on file   Highest education level: Not on file  Occupational History   Occupation: Retired  Tobacco Use   Smoking status: Never   Smokeless tobacco: Never  Vaping Use   Vaping status: Never Used  Substance and Sexual Activity   Alcohol use: Yes    Comment: 1-2 drinks every 1-2 months   Drug use: No   Sexual activity: Not on file  Other Topics Concern    Not on file  Social History Narrative   Not on file   Social Drivers of Health   Financial Resource Strain: Low Risk  (10/14/2023)   Received from [facility]   Overall Financial Resource Strain (CARDIA)    Difficulty of Paying Living Expenses: Not hard at all  Food Insecurity: No Food Insecurity (10/14/2023)   Received from [facility]   Hunger Vital Sign    Worried About Running Out of Food in the Last Year: Never true    Ran Out of Food in the Last Year: Never true  Transportation Needs: No Transportation Needs (10/14/2023)   Received from [facility] - Transportation    In the past 12 months, has lack of transportation kept you from medical appointments or from getting medications?: No    Lack of Transportation (Non-Medical): No  Physical Activity: Inactive (11/13/2020)   Received from [facility]   Exercise Vital Sign  Stress: No Stress Concern Present (11/13/2020)   Received from [facility] - Occupational Stress Questionnaire  Social Connections: Not on file  Intimate Partner Violence: Not on file    FAMILY HISTORY: Family History  Problem Relation Age of Onset   Stroke Mother    Hypertension Mother    Cancer Father    Prostate cancer Brother    Breast cancer Maternal Aunt 21   Breast cancer Cousin        mat cousin   Kidney cancer Neg Hx    Kidney disease Neg Hx     ALLERGIES:  is allergic to colesevelam; oxybutynin; rosuvastatin; and zoster vaccine recombinant, adjuvanted.  MEDICATIONS:  Current Outpatient Medications  Medication Sig Dispense Refill   acetaminophen  (TYLENOL ) 325 MG tablet Take 1,000 mg by mouth 2 (two) times daily.      allopurinol (ZYLOPRIM) 100 MG tablet      atorvastatin  (LIPITOR) 20 MG tablet Take 1 tablet by mouth daily.     Blood Glucose Monitoring Suppl (FIFTY50 GLUCOSE METER 2.0) w/Device KIT Use  as directed BAYER CONTOUR NEXT METER DX: E11.9     CONTOUR NEXT TEST test strip      ergocalciferol (VITAMIN D2) 1.25 MG (50000 UT) capsule Take 50,000 Units by mouth once a week.     Fluticasone-Umeclidin-Vilant (TRELEGY ELLIPTA) 100-62.5-25 MCG/INH AEPB Inhale into the lungs as needed.     furosemide (LASIX) 40 MG tablet Take one tablet by mouth  once daily as needed for lower extremity edema     gabapentin  (NEURONTIN ) 300 MG capsule Take 300 mg by mouth at bedtime.     hydrocortisone (ANUSOL-HC) 2.5 % rectal cream Place 1 application rectally 2 (two) times daily.     magnesium  oxide (MAG-OX) 400 MG tablet Take 800 mg by mouth daily.      nystatin  cream (MYCOSTATIN ) Apply 1 application topically 2 (two) times daily as needed. Apply thin film to yeast rash under the breasts     Omega-3 Krill Oil 500 MG CAPS Take 500 mg by mouth 3 (three) times a week. On Monday Wednesday and Friday     omeprazole (PRILOSEC) 20 MG capsule Take 20 mg by mouth daily. Acid reflux      sennosides-docusate sodium  (SENOKOT-S) 8.6-50 MG tablet Take 2 tablets by mouth 2 (two) times daily.      solifenacin  (VESICARE ) 5 MG tablet Take 5 mg by mouth daily. (Patient not taking: Reported on 09/16/2022)     TOPROL  XL 25 MG 24 hr tablet Take 25 mg by mouth at bedtime.     triamterene-hydrochlorothiazide (DYAZIDE) 37.5-25 MG capsule Take 1 capsule by mouth daily.     WIXELA INHUB 250-50 MCG/ACT AEPB Inhale 1 puff into the lungs 2 (two) times daily.     No current facility-administered medications for this visit.     PHYSICAL EXAMINATION: ECOG PERFORMANCE STATUS: 1 - Symptomatic but completely ambulatory Vitals:   11/18/23 1030  BP: (!) 147/57  Pulse: (!) 51  Resp: 16  Temp: 97.9 F (36.6 C)  SpO2: 100%   Filed Weights   11/18/23 1030  Weight: 247 lb (112 kg)    Physical Exam Constitutional:      General: She is not in acute distress.    Appearance: She is obese.  HENT:     Head: Normocephalic and atraumatic.   Eyes:     General: No scleral icterus.    Pupils: Pupils are equal, round, and reactive to light.  Cardiovascular:     Rate and Rhythm: Normal rate and regular rhythm.     Heart sounds: Normal heart sounds.  Pulmonary:     Effort: Pulmonary effort is normal. No respiratory distress.     Breath sounds: No wheezing.  Abdominal:     General: Bowel sounds are normal. There is no distension.     Palpations: Abdomen is soft. There is no mass.     Tenderness: There is no abdominal tenderness.  Musculoskeletal:        General: No deformity. Normal range of motion.     Cervical back: Normal range of motion and neck supple.     Right lower leg: Edema present.     Left lower leg: Edema present.     Comments: Bilateral lower extremity trace edema chronic  Skin:    General: Skin is warm and dry.     Findings: No erythema or rash.  Neurological:     Mental Status: She is alert and oriented to person, place, and time. Mental status is at baseline.     Cranial Nerves: No cranial nerve deficit.     Coordination: Coordination normal.  Psychiatric:        Mood and Affect: Mood normal.        Behavior: Behavior normal.        Thought Content: Thought content normal.      LABORATORY DATA:  I have reviewed the data as listed    Latest Ref  Rng & Units 11/18/2023   10:15 AM 10/31/2022   10:39 AM 04/23/2022   10:14 AM  CBC  WBC 4.0 - 10.5 K/uL 5.2  5.6  5.0   Hemoglobin 12.0 - 15.0 g/dL 09.8  11.9  14.7   Hematocrit 36.0 - 46.0 % 40.7  45.9  45.0   Platelets 150 - 400 K/uL 156  173  172       Latest Ref Rng & Units 11/18/2023   10:15 AM 10/31/2022   10:39 AM 04/23/2022   10:14 AM  CMP  Glucose 70 - 99 mg/dL 829  562  130   BUN 8 - 23 mg/dL 17  16  20    Creatinine 0.44 - 1.00 mg/dL 8.65  7.84  6.96   Sodium 135 - 145 mmol/L 138  139  141   Potassium 3.5 - 5.1 mmol/L 4.1  4.1  3.9   Chloride 98 - 111 mmol/L 107  105  110   CO2 22 - 32 mmol/L 26  26  27    Calcium  8.9 - 10.3 mg/dL 9.6  9.8   9.6   Total Protein 6.5 - 8.1 g/dL 6.7  7.3  7.0   Total Bilirubin 0.0 - 1.2 mg/dL 0.8  0.7  0.5   Alkaline Phos 38 - 126 U/L 86  96  88   AST 15 - 41 U/L 15  17  15    ALT 0 - 44 U/L 13  10  9       Iron/TIBC/Ferritin/ %Sat No results found for: "IRON", "TIBC", "FERRITIN", "IRONPCTSAT"   RADIOGRAPHIC STUDIES: I have personally reviewed the radiological images as listed and agreed with the findings in the report. MR ABDOMEN MRCP W WO CONTAST Result Date: 11/04/2023 CLINICAL DATA:  Right upper quadrant pain, pancreatic cyst EXAM: MRI ABDOMEN WITHOUT AND WITH CONTRAST (INCLUDING MRCP) TECHNIQUE: Multiplanar multisequence MR imaging of the abdomen was performed both before and after the administration of intravenous contrast. Heavily T2-weighted images of the biliary and pancreatic ducts were obtained, and three-dimensional MRCP images were rendered by post processing. CONTRAST:  10mL GADAVIST  GADOBUTROL  1 MMOL/ML IV SOLN COMPARISON:  10/29/2022, 10/12/2019 FINDINGS: Lower chest: No acute abnormality. Hepatobiliary: No solid liver abnormality is seen. No gallstones, gallbladder wall thickening, or biliary dilatation. Pancreas: Similar septated fluid signal cystic lesion in the tip of the pancreatic tail measuring 1.8 x 1.3 x 1.2 cm (series 3, image 8, series 4, image 14). Additional very tiny subcentimeter cystic lesions in the pancreatic tail. No solid component or suspicious contrast enhancement. No pancreatic ductal dilatation or surrounding inflammatory changes. Spleen: Normal in size. Unchanged enhancing lesion in the lateral aspect of the spleen measuring 5.6 x 4.7 cm (series 19, image 11). Adrenals/Urinary Tract: Adrenal glands are unremarkable. Fluid signal renal cortical cysts of the left kidney, benign, requiring no further follow-up or characterization. Kidneys are otherwise normal, without renal calculi, solid lesion, or hydronephrosis. Stomach/Bowel: Stomach is within normal limits. No  evidence of bowel wall thickening, distention, or inflammatory changes. Vascular/Lymphatic: No significant vascular findings are present. No enlarged abdominal lymph nodes. Other: No abdominal wall hernia or abnormality. No ascites. Musculoskeletal: No acute or significant osseous findings. IMPRESSION: 1. Similar septated fluid signal cystic lesion in the tip of the pancreatic tail measuring 1.8 x 1.3 x 1.2 cm. Additional very tiny subcentimeter cystic lesions in the pancreatic tail. No solid component or suspicious contrast enhancement. No pancreatic ductal dilatation or surrounding inflammatory changes. Given size, established imaging stability, and patient age  greater than 80 years, no further follow-up or characterization is required. 2. Unchanged enhancing lesion in the lateral aspect of the spleen measuring 5.6 x 4.7 cm. 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 No specific further follow-up or characterization is required. 3. No acute findings.  No MR findings to explain pain. Electronically Signed   By: Fredricka Jenny M.D.   On: 11/04/2023 07:34   MR 3D Recon At Scanner Result Date: 11/04/2023 CLINICAL DATA:  Right upper quadrant pain, pancreatic cyst EXAM: MRI ABDOMEN WITHOUT AND WITH CONTRAST (INCLUDING MRCP) TECHNIQUE: Multiplanar multisequence MR imaging of the abdomen was performed both before and after the administration of intravenous contrast. Heavily T2-weighted images of the biliary and pancreatic ducts were obtained, and three-dimensional MRCP images were rendered by post processing. CONTRAST:  10mL GADAVIST  GADOBUTROL  1 MMOL/ML IV SOLN COMPARISON:  10/29/2022, 10/12/2019 FINDINGS: Lower chest: No acute abnormality. Hepatobiliary: No solid liver abnormality is seen. No gallstones, gallbladder wall thickening, or biliary dilatation. Pancreas: Similar septated fluid signal cystic lesion in the tip of the pancreatic tail measuring 1.8 x 1.3 x 1.2 cm (series 3, image 8, series 4, image  14). Additional very tiny subcentimeter cystic lesions in the pancreatic tail. No solid component or suspicious contrast enhancement. No pancreatic ductal dilatation or surrounding inflammatory changes. Spleen: Normal in size. Unchanged enhancing lesion in the lateral aspect of the spleen measuring 5.6 x 4.7 cm (series 19, image 11). Adrenals/Urinary Tract: Adrenal glands are unremarkable. Fluid signal renal cortical cysts of the left kidney, benign, requiring no further follow-up or characterization. Kidneys are otherwise normal, without renal calculi, solid lesion, or hydronephrosis. Stomach/Bowel: Stomach is within normal limits. No evidence of bowel wall thickening, distention, or inflammatory changes. Vascular/Lymphatic: No significant vascular findings are present. No enlarged abdominal lymph nodes. Other: No abdominal wall hernia or abnormality. No ascites. Musculoskeletal: No acute or significant osseous findings. IMPRESSION: 1. Similar septated fluid signal cystic lesion in the tip of the pancreatic tail measuring 1.8 x 1.3 x 1.2 cm. Additional very tiny subcentimeter cystic lesions in the pancreatic tail. No solid component or suspicious contrast enhancement. No pancreatic ductal dilatation or surrounding inflammatory changes. Given size, established imaging stability, and patient age greater than 80 years, no further follow-up or characterization is required. 2. Unchanged enhancing lesion in the lateral aspect of the spleen measuring 5.6 x 4.7 cm. 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 No specific further follow-up or characterization is required. 3. No acute findings.  No MR findings to explain pain. Electronically Signed   By: Fredricka Jenny M.D.   On: 11/04/2023 07:34   MM 3D SCREENING MAMMOGRAM BILATERAL BREAST Result Date: 09/29/2023 CLINICAL DATA:  Screening. EXAM: DIGITAL SCREENING BILATERAL MAMMOGRAM WITH TOMOSYNTHESIS AND CAD TECHNIQUE: Bilateral screening digital craniocaudal  and mediolateral oblique mammograms were obtained. Bilateral screening digital breast tomosynthesis was performed. The images were evaluated with computer-aided detection. COMPARISON:  Previous exam(s). ACR Breast Density Category b: There are scattered areas of fibroglandular density. FINDINGS: There are no findings suspicious for malignancy. IMPRESSION: No mammographic evidence of malignancy. A result letter of this screening mammogram will be mailed directly to the patient. RECOMMENDATION: Screening mammogram in one year. (Code:SM-B-01Y) BI-RADS CATEGORY  1: Negative. Electronically Signed   By: Sundra Engel M.D.   On: 09/29/2023 08:46

## 2023-11-18 NOTE — Assessment & Plan Note (Signed)
 04/23/2018 which showed lung nodules  I have ordered CT chest multiple times and patient has not had it done yet.   She has upcoming CT chest abdomen pelvis ordered by GI.

## 2023-11-18 NOTE — Assessment & Plan Note (Addendum)
 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 oncology surgeon evaluation done by Dr. Burdette Carolin.  Patient elected to continue surveillance. Recent MRI abdomen MRCP showed decreased size of the cyst.  Given the stable size and patient's age, recommend to stop surveillance. She agrees with the plan.  CEA and CA 19-9 have been monitored, levels are pending at the time of dictation.

## 2023-11-18 NOTE — Assessment & Plan Note (Signed)
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  This lesion was FDG negative on previous PET scan.

## 2023-11-18 NOTE — Progress Notes (Signed)
 She had an MRI and CT scans about a week ago. Patient has been experiencing some pain in abdomen, that reaches around her right side, it isn't always there. She is taking senna up to two times daily for her meals. She is going to try taking some Murelax.

## 2023-11-19 LAB — CANCER ANTIGEN 19-9: CA 19-9: 11 U/mL (ref 0–35)

## 2023-11-19 LAB — CEA: CEA: 2.3 ng/mL (ref 0.0–4.7)

## 2023-11-20 ENCOUNTER — Ambulatory Visit
Admission: RE | Admit: 2023-11-20 | Discharge: 2023-11-20 | Disposition: A | Discharge status: Home / Self Care | Source: Ambulatory Visit | Attending: Gastroenterology | Admitting: Gastroenterology

## 2023-11-20 DIAGNOSIS — R1031 Right lower quadrant pain: Secondary | ICD-10-CM | POA: Insufficient documentation

## 2023-11-20 DIAGNOSIS — Z860101 Personal history of adenomatous and serrated colon polyps: Secondary | ICD-10-CM | POA: Insufficient documentation

## 2023-11-20 DIAGNOSIS — R918 Other nonspecific abnormal finding of lung field: Secondary | ICD-10-CM | POA: Diagnosis not present

## 2023-11-20 DIAGNOSIS — K5730 Diverticulosis of large intestine without perforation or abscess without bleeding: Secondary | ICD-10-CM | POA: Diagnosis not present

## 2023-11-20 DIAGNOSIS — R634 Abnormal weight loss: Secondary | ICD-10-CM | POA: Insufficient documentation

## 2023-11-20 DIAGNOSIS — Q859 Phakomatosis, unspecified: Secondary | ICD-10-CM | POA: Diagnosis not present

## 2023-11-20 DIAGNOSIS — K5900 Constipation, unspecified: Secondary | ICD-10-CM | POA: Diagnosis not present

## 2023-11-20 DIAGNOSIS — K862 Cyst of pancreas: Secondary | ICD-10-CM | POA: Diagnosis not present

## 2023-11-20 MED ORDER — IOHEXOL 300 MG/ML  SOLN
100.0000 mL | Freq: Once | INTRAMUSCULAR | Status: AC | PRN
  Administered 2023-11-20: 100 mL via INTRAVENOUS

## 2023-11-25 ENCOUNTER — Encounter

## 2023-11-25 DIAGNOSIS — E11622 Type 2 diabetes mellitus with other skin ulcer: Secondary | ICD-10-CM | POA: Diagnosis not present

## 2023-11-26 ENCOUNTER — Encounter: Payer: Self-pay | Admitting: Gastroenterology

## 2023-11-27 ENCOUNTER — Encounter
Admission: RE | Disposition: A | Discharge status: Home / Self Care | Payer: Self-pay | Source: Home / Self Care | Attending: Gastroenterology

## 2023-11-27 ENCOUNTER — Encounter: Payer: Self-pay | Admitting: Gastroenterology

## 2023-11-27 ENCOUNTER — Other Ambulatory Visit: Payer: Self-pay

## 2023-11-27 ENCOUNTER — Ambulatory Visit: Admitting: Anesthesiology

## 2023-11-27 ENCOUNTER — Ambulatory Visit
Admission: RE | Admit: 2023-11-27 | Discharge: 2023-11-27 | Disposition: A | Discharge status: Home / Self Care | Attending: Gastroenterology | Admitting: Gastroenterology

## 2023-11-27 ENCOUNTER — Other Ambulatory Visit: Payer: Self-pay | Admitting: Gastroenterology

## 2023-11-27 DIAGNOSIS — I509 Heart failure, unspecified: Secondary | ICD-10-CM | POA: Insufficient documentation

## 2023-11-27 DIAGNOSIS — K2289 Other specified disease of esophagus: Secondary | ICD-10-CM | POA: Diagnosis not present

## 2023-11-27 DIAGNOSIS — N182 Chronic kidney disease, stage 2 (mild): Secondary | ICD-10-CM | POA: Insufficient documentation

## 2023-11-27 DIAGNOSIS — E1122 Type 2 diabetes mellitus with diabetic chronic kidney disease: Secondary | ICD-10-CM | POA: Diagnosis not present

## 2023-11-27 DIAGNOSIS — I130 Hypertensive heart and chronic kidney disease with heart failure and stage 1 through stage 4 chronic kidney disease, or unspecified chronic kidney disease: Secondary | ICD-10-CM | POA: Insufficient documentation

## 2023-11-27 DIAGNOSIS — J479 Bronchiectasis, uncomplicated: Secondary | ICD-10-CM | POA: Insufficient documentation

## 2023-11-27 DIAGNOSIS — R634 Abnormal weight loss: Secondary | ICD-10-CM | POA: Insufficient documentation

## 2023-11-27 DIAGNOSIS — Q402 Other specified congenital malformations of stomach: Secondary | ICD-10-CM | POA: Diagnosis not present

## 2023-11-27 DIAGNOSIS — Z79899 Other long term (current) drug therapy: Secondary | ICD-10-CM | POA: Diagnosis not present

## 2023-11-27 DIAGNOSIS — K3189 Other diseases of stomach and duodenum: Secondary | ICD-10-CM | POA: Diagnosis not present

## 2023-11-27 DIAGNOSIS — E041 Nontoxic single thyroid nodule: Secondary | ICD-10-CM

## 2023-11-27 DIAGNOSIS — K2270 Barrett's esophagus without dysplasia: Secondary | ICD-10-CM | POA: Insufficient documentation

## 2023-11-27 HISTORY — PX: ESOPHAGOGASTRODUODENOSCOPY: SHX5428

## 2023-11-27 LAB — GLUCOSE, CAPILLARY: Glucose-Capillary: 103 mg/dL — ABNORMAL HIGH (ref 70–99)

## 2023-11-27 SURGERY — EGD (ESOPHAGOGASTRODUODENOSCOPY)
Anesthesia: General

## 2023-11-27 MED ORDER — PROPOFOL 10 MG/ML IV BOLUS
INTRAVENOUS | Status: DC | PRN
Start: 2023-11-27 — End: 2023-11-27
  Administered 2023-11-27: 20 mg via INTRAVENOUS
  Administered 2023-11-27: 10 mg via INTRAVENOUS
  Administered 2023-11-27: 60 mg via INTRAVENOUS
  Administered 2023-11-27: 20 mg via INTRAVENOUS

## 2023-11-27 MED ORDER — SODIUM CHLORIDE 0.9 % IV SOLN: INTRAVENOUS | Status: DC

## 2023-11-27 MED ORDER — PROPOFOL 1000 MG/100ML IV EMUL
INTRAVENOUS | Status: AC
  Filled 2023-11-27: qty 100

## 2023-11-27 MED ORDER — LIDOCAINE HCL (CARDIAC) PF 100 MG/5ML IV SOSY
PREFILLED_SYRINGE | INTRAVENOUS | Status: DC | PRN
  Administered 2023-11-27: 80 mg via INTRAVENOUS

## 2023-11-27 MED ORDER — DEXMEDETOMIDINE HCL IN NACL 80 MCG/20ML IV SOLN
INTRAVENOUS | Status: DC | PRN
  Administered 2023-11-27: 4 ug via INTRAVENOUS

## 2023-11-27 NOTE — Anesthesia Postprocedure Evaluation (Signed)
 Anesthesia Post Note  Patient: RENNEE COYNE  Procedure(s) Performed: EGD (ESOPHAGOGASTRODUODENOSCOPY)  Patient location during evaluation: PACU Anesthesia Type: General Level of consciousness: awake and alert, oriented and patient cooperative Pain management: pain level controlled Vital Signs Assessment: post-procedure vital signs reviewed and stable Respiratory status: spontaneous breathing, nonlabored ventilation and respiratory function stable Cardiovascular status: blood pressure returned to baseline and stable Postop Assessment: adequate PO intake Anesthetic complications: no   No notable events documented.   Last Vitals:  Vitals:   11/27/23 0822 11/27/23 0832  BP: 130/62 133/72  Pulse:    Resp:    Temp:    SpO2: 100%     Last Pain:  Vitals:   11/27/23 0832  TempSrc:   PainSc: 0-No pain                 Dorothey Gate

## 2023-11-27 NOTE — OR Nursing (Signed)
 Monitor showed afib briefly and then showed "end afib." Dr. Mazzoni with anesthesia was notified of this. No interventions neccesssary. Vital signs were normal and stable.

## 2023-11-27 NOTE — Op Note (Signed)
 [facility] Gastroenterology Patient Name: [name] Procedure Date: 11/27/2023 7:52 AM MRN: [id number] Account #: [id number] Date of Birth: February 19, 1941 Admit Type: Outpatient Age: 83 Room: [facility] ENDO ROOM 1 Gender: Female Note Status: Finalized Instrument Name: Upper Endoscope [id number] Procedure:             Upper GI endoscopy Indications:           Weight loss Providers:             Quintin Buckle DO, DO Referring MD:          Antonio Baumgarten, MD (Referring MD) Medicines:             Monitored Anesthesia Care Complications:         No immediate complications. Estimated blood loss:                         Minimal. Procedure:             Pre-Anesthesia Assessment:                        - Prior to the procedure, a History and Physical was                         performed, and patient medications and allergies were                         reviewed. The patient is competent. The risks and                         benefits of the procedure and the sedation options and                         risks were discussed with the patient. All questions                         were answered and informed consent was obtained.                         Patient identification and proposed procedure were                         verified by the physician, the nurse, the anesthetist                         and the technician in the endoscopy suite. Mental                         Status Examination: alert and oriented. Airway                         Examination: normal oropharyngeal airway and neck                         mobility. Respiratory Examination: clear to                         auscultation. CV Examination: RRR, no murmurs, no S3  or S4. Prophylactic Antibiotics: The patient does not                         require prophylactic antibiotics. Prior                         Anticoagulants: The patient has taken no anticoagulant                          or antiplatelet agents. ASA Grade Assessment: II - A                         patient with mild systemic disease. After reviewing                         the risks and benefits, the patient was deemed in                         satisfactory condition to undergo the procedure. The                         anesthesia plan was to use monitored anesthesia care                         (MAC). Immediately prior to administration of                         medications, the patient was re-assessed for adequacy                         to receive sedatives. The heart rate, respiratory                         rate, oxygen saturations, blood pressure, adequacy of                         pulmonary ventilation, and response to care were                         monitored throughout the procedure. The physical                         status of the patient was re-assessed after the                         procedure.                        After obtaining informed consent, the endoscope was                         passed under direct vision. Throughout the procedure,                         the patient's blood pressure, pulse, and oxygen                         saturations were monitored continuously. The  Endosonoscope was introduced through the mouth, and                         advanced to the second part of duodenum. The upper GI                         endoscopy was accomplished without difficulty. The                         patient tolerated the procedure well. Findings:      The first portion of the duodenum and second portion of the duodenum       were normal. Prominent brunners glands in duodenal bulb. Biopsies for       histology were taken with a cold forceps for evaluation of celiac       disease. Estimated blood loss was minimal.      The entire examined stomach was normal. Biopsies were taken with a cold       forceps for Helicobacter pylori testing. Estimated blood  loss was       minimal.      Esophagogastric landmarks were identified: the gastroesophageal junction       was found at 35 cm from the incisors.      The Z-line was irregular. two tongues of salmon colored mucosa extending       ~1cm above the z-line. Biopsies were taken with a cold forceps for       histology. Estimated blood loss was minimal.      The exam of the esophagus was otherwise normal. Impression:            - Normal first portion of the duodenum and second                         portion of the duodenum. Biopsied.                        - Normal stomach. Biopsied.                        - Esophagogastric landmarks identified.                        - Z-line irregular. Biopsied. Recommendation:        - Patient has a contact number available for                         emergencies. The signs and symptoms of potential                         delayed complications were discussed with the patient.                         Return to normal activities tomorrow. Written                         discharge instructions were provided to the patient.                        - Discharge patient to home.                        -  Resume previous diet.                        - Continue present medications.                        - Use Protonix  (pantoprazole ) 40 mg PO daily.                        - No ibuprofen, naproxen, or other non-steroidal                         anti-inflammatory drugs for 5 days.                        - Await pathology results.                        - Repeat upper endoscopy for surveillance based on                         pathology results.                        - Return to GI clinic as previously scheduled.                        - The findings and recommendations were discussed with                         the patient. Procedure Code(s):     --- Professional ---                        [id number], Esophagogastroduodenoscopy, flexible,                          transoral; with biopsy, single or multiple Diagnosis Code(s):     --- Professional ---                        K22.89, Other specified disease of esophagus                        R63.4, Abnormal weight loss CPT copyright 2022 American Medical Association. All rights reserved. The codes documented in this report are preliminary and upon coder review may  be revised to meet current compliance requirements. Attending Participation:      I personally performed the entire procedure. Polo Brisk, DO Quintin Buckle DO, DO 11/27/2023 8:12:56 AM This report has been signed electronically. Number of Addenda: 0 Note Initiated On: 11/27/2023 7:52 AM Estimated Blood Loss:  Estimated blood loss was minimal.      [facility]

## 2023-11-27 NOTE — Transfer of Care (Signed)
 Immediate Anesthesia Transfer of Care Note  Patient: [name]  Procedure(s) Performed: EGD (ESOPHAGOGASTRODUODENOSCOPY)  Patient Location: PACU  Anesthesia Type:General  Level of Consciousness: awake, alert , and oriented  Airway & Oxygen Therapy: Patient Spontanous Breathing  Post-op Assessment: Report given to RN and Post -op Vital signs reviewed and stable  Post vital signs: Reviewed and stable  Last Vitals:  Vitals Value Taken Time  BP 126/62 11/27/23 0812  Temp    Pulse 62 11/27/23 0812  Resp 24 11/27/23 0812  SpO2 92 % 11/27/23 0812  Vitals shown include unfiled device data.  Last Pain:  Vitals:   11/27/23 0718  TempSrc: Temporal  PainSc: 0-No pain         Complications: No notable events documented.

## 2023-11-27 NOTE — H&P (Signed)
 Pre-Procedure H&P   Patient ID: [name] is a 83 y.o. female.  Gastroenterology Provider: Quintin Buckle, DO  Referring Provider: Laquetta Plank, PA PCP: Antonio Baumgarten, MD  Date: 11/27/2023  HPI [name] is a 83 y.o. female who presents today for Esophagogastroduodenoscopy for Unintentional weight loss .  Patient reports about 30 pound weight loss.  Notes decreased appetite.  No nausea vomiting pain dysphagia or odynophagia. Colonoscopy in March 2024 and May 2023  CT small HH, patulous esophagus. Otherwise negative gi findings. Bronchiectasis   Cr 1.0 hgb 13.3 mcv 93 plt 156K   Past Medical History:  Diagnosis Date   Acquired hammertoe    other   Acquired hammertoe    Angina pectoris, unspecified (HCC)    Angina pectoris, unspecified (HCC)    Arthritis    knees   Breast cancer (HCC) 1990   left breast   Cancer (HCC) 1990,s   left breast   CHF (congestive heart failure) (HCC)    Chronic kidney disease    stage 2   Degenerative joint disease    Dermatophytosis of nail    Diabetes mellitus type 2, uncomplicated (HCC)    diet controlled   Dyspnea    Essential hypertension, benign    Exostosis    unspecified site   Fracture of right ankle 2010   Followed by Dr. Althea Atkinson   GERD (gastroesophageal reflux disease)    Hemorrhoids    Hypercholesterolemia    Hyperlipidemia, unspecified    Hypertension    Lymphedema of leg    left leg worse than right leg.   Obesity, unspecified    Osteoarthrosis involving lower leg    unspecified whether generalized or localized    Pre-diabetes    Pure hypercholesterolemia    Vitamin D deficiency, unspecified    Wears dentures    partial upper    Past Surgical History:  Procedure Laterality Date   BREAST LUMPECTOMY Left 1995   BREAST SURGERY Left 1990   lumpectomy   BUNIONECTOMY Bilateral 1990's   plus hammer toe repair on left foot.   CATARACT EXTRACTION W/PHACO Left 08/10/2018   Procedure:  CATARACT EXTRACTION PHACO AND INTRAOCULAR LENS PLACEMENT (IOC)  LEFT DIABETIC;  Surgeon: Rosa College, MD;  Location: [facility];  Service: Ophthalmology;  Laterality: Left;  diabetic - diet controlled   CATARACT EXTRACTION W/PHACO Right 11/16/2018   Procedure: CATARACT EXTRACTION PHACO AND INTRAOCULAR LENS PLACEMENT (IOC)  RIGHT DIABETIC;  Surgeon: Rosa College, MD;  Location: [facility];  Service: Ophthalmology;  Laterality: Right;  Diabetic - diet controlled   COLONOSCOPY     COLONOSCOPY N/A 11/01/2021   Procedure: COLONOSCOPY;  Surgeon: Quintin Buckle, DO;  Location: [facility] ENDOSCOPY;  Service: Gastroenterology;  Laterality: N/A;  DIET CONTROLLED   COLONOSCOPY N/A 09/16/2022   Procedure: COLONOSCOPY;  Surgeon: Quintin Buckle, DO;  Location: [facility] ENDOSCOPY;  Service: Gastroenterology;  Laterality: N/A;   COLONOSCOPY WITH PROPOFOL  N/A 05/08/2018   Procedure: COLONOSCOPY WITH PROPOFOL ;  Surgeon: Deveron Fly, MD;  Location: [facility] ENDOSCOPY;  Service: Endoscopy;  Laterality: N/A;   ENDOMETRIAL BIOPSY     EYE SURGERY     FOOT SURGERY Left    for bunionectomy and hammertoe   JOINT REPLACEMENT     TONSILLECTOMY     TOTAL KNEE ARTHROPLASTY Left 04/08/2017   Procedure: TOTAL KNEE ARTHROPLASTY;  Surgeon: Elner Hahn, MD;  Location: [facility] ORS;  Service: Orthopedics;  Laterality: Left;  TOTAL KNEE ARTHROPLASTY Right 08/19/2017   Procedure: TOTAL KNEE ARTHROPLASTY;  Surgeon: Elner Hahn, MD;  Location: [facility] ORS;  Service: Orthopedics;  Laterality: Right;    Family History Brother- crc No other h/o GI disease or malignancy  Review of Systems  Constitutional:  Positive for appetite change and unexpected weight change. Negative for activity change, chills, diaphoresis, fatigue and fever.  HENT:  Negative for trouble swallowing and voice change.   Respiratory:  Negative for shortness of breath and wheezing.   Cardiovascular:  Negative for chest pain,  palpitations and leg swelling.  Gastrointestinal:  Negative for abdominal distention, abdominal pain, anal bleeding, blood in stool, constipation, diarrhea, nausea, rectal pain and vomiting.  Musculoskeletal:  Negative for arthralgias and myalgias.  Skin:  Negative for color change and pallor.  Neurological:  Negative for dizziness, syncope and weakness.  Psychiatric/Behavioral:  Negative for confusion.   All other systems reviewed and are negative.    Medications No current facility-administered medications on file prior to encounter.   Current Outpatient Medications on File Prior to Encounter  Medication Sig Dispense Refill   allopurinol (ZYLOPRIM) 100 MG tablet      atorvastatin  (LIPITOR) 20 MG tablet Take 1 tablet by mouth daily.     ergocalciferol (VITAMIN D2) 1.25 MG (50000 UT) capsule Take 50,000 Units by mouth once a week.     gabapentin  (NEURONTIN ) 300 MG capsule Take 300 mg by mouth at bedtime.     magnesium  oxide (MAG-OX) 400 MG tablet Take 800 mg by mouth daily.      Omega-3 Krill Oil 500 MG CAPS Take 500 mg by mouth 3 (three) times a week. On Monday Wednesday and Friday     omeprazole (PRILOSEC) 20 MG capsule Take 20 mg by mouth daily. Acid reflux      sennosides-docusate sodium  (SENOKOT-S) 8.6-50 MG tablet Take 2 tablets by mouth 2 (two) times daily.      triamterene-hydrochlorothiazide (DYAZIDE) 37.5-25 MG capsule Take 1 capsule by mouth daily.     acetaminophen  (TYLENOL ) 325 MG tablet Take 1,000 mg by mouth 2 (two) times daily.      Blood Glucose Monitoring Suppl (FIFTY50 GLUCOSE METER 2.0) w/Device KIT Use as directed BAYER CONTOUR NEXT METER DX: E11.9     CONTOUR NEXT TEST test strip      Fluticasone-Umeclidin-Vilant (TRELEGY ELLIPTA) 100-62.5-25 MCG/INH AEPB Inhale into the lungs as needed.     furosemide (LASIX) 40 MG tablet Take one tablet by mouth once daily as needed for lower extremity edema     hydrocortisone (ANUSOL-HC) 2.5 % rectal cream Place 1 application  rectally 2 (two) times daily.     nystatin  cream (MYCOSTATIN ) Apply 1 application topically 2 (two) times daily as needed. Apply thin film to yeast rash under the breasts     solifenacin  (VESICARE ) 5 MG tablet Take 5 mg by mouth daily. (Patient not taking: Reported on 09/16/2022)     TOPROL  XL 25 MG 24 hr tablet Take 25 mg by mouth at bedtime.     WIXELA INHUB 250-50 MCG/ACT AEPB Inhale 1 puff into the lungs 2 (two) times daily.      Pertinent medications related to GI and procedure were reviewed by me with the patient prior to the procedure   Current Facility-Administered Medications:    0.9 %  sodium chloride  infusion, , Intravenous, Continuous, Quintin Buckle, DO  sodium chloride          Allergies  Allergen Reactions   Colesevelam Other (See  Comments)    constipation   Oxybutynin Swelling    Leg swelling   Rosuvastatin Other (See Comments)    Pain in lower extremities   Zoster Vaccine Recombinant, Adjuvanted Hives   Allergies were reviewed by me prior to the procedure  Objective   Body mass index is 33.09 kg/m. Vitals:   11/27/23 0718  BP: (!) 141/58  Resp: 16  Temp: (!) 96.3 F (35.7 C)  TempSrc: Temporal  Weight: 110.7 kg  Height: 6' (1.829 m)     Physical Exam Vitals and nursing note reviewed.  Constitutional:      General: She is not in acute distress.    Appearance: Normal appearance. She is not ill-appearing, toxic-appearing or diaphoretic.  HENT:     Head: Normocephalic and atraumatic.     Nose: Nose normal.     Mouth/Throat:     Mouth: Mucous membranes are moist.     Pharynx: Oropharynx is clear.  Eyes:     General: No scleral icterus.    Extraocular Movements: Extraocular movements intact.  Cardiovascular:     Rate and Rhythm: Normal rate and regular rhythm.     Heart sounds: Normal heart sounds. No murmur heard.    No friction rub. No gallop.  Pulmonary:     Effort: Pulmonary effort is normal. No respiratory distress.     Breath  sounds: Normal breath sounds. No wheezing, rhonchi or rales.  Abdominal:     General: Bowel sounds are normal. There is no distension.     Palpations: Abdomen is soft.     Tenderness: There is no abdominal tenderness. There is no guarding or rebound.  Musculoskeletal:     Cervical back: Neck supple.     Right lower leg: Edema present.     Left lower leg: Edema present.  Skin:    General: Skin is warm and dry.     Coloration: Skin is not jaundiced or pale.  Neurological:     General: No focal deficit present.     Mental Status: She is alert and oriented to person, place, and time. Mental status is at baseline.  Psychiatric:        Mood and Affect: Mood normal.        Behavior: Behavior normal.        Thought Content: Thought content normal.        Judgment: Judgment normal.      Assessment:  [name] is a 83 y.o. female  who presents today for Esophagogastroduodenoscopy for Unintentional weight loss .  Plan:  Esophagogastroduodenoscopy with possible intervention today  Esophagogastroduodenoscopy with possible biopsy, control of bleeding, polypectomy, and interventions as necessary has been discussed with the patient/patient representative. Informed consent was obtained from the patient/patient representative after explaining the indication, nature, and risks of the procedure including but not limited to death, bleeding, perforation, missed neoplasm/lesions, cardiorespiratory compromise, and reaction to medications. Opportunity for questions was given and appropriate answers were provided. Patient/patient representative has verbalized understanding is amenable to undergoing the procedure.   Quintin Buckle, DO  [facility] Gastroenterology  Portions of the record may have been created with voice recognition software. Occasional wrong-word or 'sound-a-like' substitutions may have occurred due to the inherent limitations of voice recognition software.  Read the  chart carefully and recognize, using context, where substitutions may have occurred.

## 2023-11-27 NOTE — Interval H&P Note (Signed)
 History and Physical Interval Note: Preprocedure H&P from 11/27/23  was reviewed and there was no interval change after seeing and examining the patient.  Written consent was obtained from the patient after discussion of risks, benefits, and alternatives. Patient has consented to proceed with Esophagogastroduodenoscopy with possible intervention   11/27/2023 7:30 AM  [name]  has presented today for surgery, with the diagnosis of Weight loss (R63.4).  The various methods of treatment have been discussed with the patient and family. After consideration of risks, benefits and other options for treatment, the patient has consented to  Procedure(s): EGD (ESOPHAGOGASTRODUODENOSCOPY) (N/A) as a surgical intervention.  The patient's history has been reviewed, patient examined, no change in status, stable for surgery.  I have reviewed the patient's chart and labs.  Questions were answered to the patient's satisfaction.     Quintin Buckle

## 2023-11-27 NOTE — Anesthesia Preprocedure Evaluation (Addendum)
 Anesthesia Evaluation  Patient identified by MRN, date of birth, ID band Patient awake    Reviewed: Allergy & Precautions, NPO status , Patient's Chart, lab work & pertinent test results  History of Anesthesia Complications Negative for: history of anesthetic complications  Airway Mallampati: III   Neck ROM: Full    Dental  (+) Partial Upper   Pulmonary neg pulmonary ROS   Pulmonary exam normal breath sounds clear to auscultation       Cardiovascular hypertension, + Peripheral Vascular Disease (bilateral carotid stenosis) and +CHF (diastolic)  Normal cardiovascular exam Rhythm:Regular Rate:Normal  Echo 01/20/23:  NORMAL LEFT VENTRICULAR SYSTOLIC FUNCTION  NORMAL RIGHT VENTRICULAR SYSTOLIC FUNCTION  MILD VALVULAR REGURGITATION   NO VALVULAR STENOSIS     Neuro/Psych negative neurological ROS     GI/Hepatic ,GERD  ,,  Endo/Other  diabetes, Type 2  Obesity   Renal/GU Renal disease (stage II CKD)     Musculoskeletal  (+) Arthritis ,    Abdominal   Peds  Hematology Breast CA   Anesthesia Other Findings   Reproductive/Obstetrics                             Anesthesia Physical Anesthesia Plan  ASA: 2  Anesthesia Plan: General   Post-op Pain Management:    Induction: Intravenous  PONV Risk Score and Plan: 3 and Propofol  infusion, TIVA and Treatment may vary due to age or medical condition  Airway Management Planned: Natural Airway  Additional Equipment:   Intra-op Plan:   Post-operative Plan:   Informed Consent: I have reviewed the patients History and Physical, chart, labs and discussed the procedure including the risks, benefits and alternatives for the proposed anesthesia with the patient or authorized representative who has indicated his/her understanding and acceptance.       Plan Discussed with: CRNA  Anesthesia Plan Comments: (LMA/GETA backup discussed.  Patient  consented for risks of anesthesia including but not limited to:  - adverse reactions to medications - damage to eyes, teeth, lips or other oral mucosa - nerve damage due to positioning  - sore throat or hoarseness - damage to heart, brain, nerves, lungs, other parts of body or loss of life  Informed patient about role of CRNA in peri- and intra-operative care.  Patient voiced understanding.)        Anesthesia Quick Evaluation

## 2023-11-28 ENCOUNTER — Encounter: Payer: Self-pay | Admitting: Gastroenterology

## 2023-11-28 LAB — SURGICAL PATHOLOGY

## 2023-12-05 ENCOUNTER — Encounter: Attending: Physician Assistant | Admitting: Physician Assistant

## 2023-12-05 DIAGNOSIS — N183 Chronic kidney disease, stage 3 unspecified: Secondary | ICD-10-CM | POA: Insufficient documentation

## 2023-12-05 DIAGNOSIS — I130 Hypertensive heart and chronic kidney disease with heart failure and stage 1 through stage 4 chronic kidney disease, or unspecified chronic kidney disease: Secondary | ICD-10-CM | POA: Diagnosis not present

## 2023-12-05 DIAGNOSIS — L97822 Non-pressure chronic ulcer of other part of left lower leg with fat layer exposed: Secondary | ICD-10-CM | POA: Diagnosis not present

## 2023-12-05 DIAGNOSIS — I5042 Chronic combined systolic (congestive) and diastolic (congestive) heart failure: Secondary | ICD-10-CM | POA: Insufficient documentation

## 2023-12-05 DIAGNOSIS — I890 Lymphedema, not elsewhere classified: Secondary | ICD-10-CM | POA: Insufficient documentation

## 2023-12-05 DIAGNOSIS — E1122 Type 2 diabetes mellitus with diabetic chronic kidney disease: Secondary | ICD-10-CM | POA: Insufficient documentation

## 2023-12-05 DIAGNOSIS — L97812 Non-pressure chronic ulcer of other part of right lower leg with fat layer exposed: Secondary | ICD-10-CM | POA: Diagnosis not present

## 2023-12-05 DIAGNOSIS — E11622 Type 2 diabetes mellitus with other skin ulcer: Secondary | ICD-10-CM | POA: Diagnosis present

## 2023-12-15 ENCOUNTER — Encounter

## 2023-12-15 DIAGNOSIS — E11622 Type 2 diabetes mellitus with other skin ulcer: Secondary | ICD-10-CM | POA: Diagnosis not present

## 2023-12-17 ENCOUNTER — Ambulatory Visit: Admission: RE | Admit: 2023-12-17 | Source: Ambulatory Visit

## 2023-12-19 ENCOUNTER — Encounter: Admitting: Internal Medicine

## 2023-12-19 ENCOUNTER — Ambulatory Visit: Admitting: Internal Medicine

## 2023-12-19 DIAGNOSIS — E11622 Type 2 diabetes mellitus with other skin ulcer: Secondary | ICD-10-CM | POA: Diagnosis not present

## 2023-12-22 ENCOUNTER — Ambulatory Visit: Admitting: Physician Assistant

## 2023-12-25 ENCOUNTER — Encounter

## 2023-12-25 DIAGNOSIS — E11622 Type 2 diabetes mellitus with other skin ulcer: Secondary | ICD-10-CM | POA: Diagnosis not present

## 2024-01-01 ENCOUNTER — Encounter: Attending: Physician Assistant | Admitting: Physician Assistant

## 2024-01-01 DIAGNOSIS — I130 Hypertensive heart and chronic kidney disease with heart failure and stage 1 through stage 4 chronic kidney disease, or unspecified chronic kidney disease: Secondary | ICD-10-CM | POA: Diagnosis not present

## 2024-01-01 DIAGNOSIS — I5042 Chronic combined systolic (congestive) and diastolic (congestive) heart failure: Secondary | ICD-10-CM | POA: Insufficient documentation

## 2024-01-01 DIAGNOSIS — L97812 Non-pressure chronic ulcer of other part of right lower leg with fat layer exposed: Secondary | ICD-10-CM | POA: Insufficient documentation

## 2024-01-01 DIAGNOSIS — I890 Lymphedema, not elsewhere classified: Secondary | ICD-10-CM | POA: Diagnosis not present

## 2024-01-01 DIAGNOSIS — E1122 Type 2 diabetes mellitus with diabetic chronic kidney disease: Secondary | ICD-10-CM | POA: Insufficient documentation

## 2024-01-01 DIAGNOSIS — N183 Chronic kidney disease, stage 3 unspecified: Secondary | ICD-10-CM | POA: Diagnosis not present

## 2024-01-01 DIAGNOSIS — L97822 Non-pressure chronic ulcer of other part of left lower leg with fat layer exposed: Secondary | ICD-10-CM | POA: Diagnosis not present

## 2024-01-01 DIAGNOSIS — E11622 Type 2 diabetes mellitus with other skin ulcer: Secondary | ICD-10-CM | POA: Insufficient documentation

## 2024-01-09 ENCOUNTER — Encounter: Admitting: Physician Assistant

## 2024-01-09 DIAGNOSIS — E11622 Type 2 diabetes mellitus with other skin ulcer: Secondary | ICD-10-CM | POA: Diagnosis not present

## 2024-01-16 ENCOUNTER — Encounter: Admitting: Physician Assistant

## 2024-01-16 DIAGNOSIS — E11622 Type 2 diabetes mellitus with other skin ulcer: Secondary | ICD-10-CM | POA: Diagnosis not present

## 2024-01-23 ENCOUNTER — Encounter: Admitting: Physician Assistant

## 2024-01-23 DIAGNOSIS — E11622 Type 2 diabetes mellitus with other skin ulcer: Secondary | ICD-10-CM | POA: Diagnosis not present

## 2024-01-30 ENCOUNTER — Encounter: Attending: Physician Assistant | Admitting: Physician Assistant

## 2024-01-30 DIAGNOSIS — E11622 Type 2 diabetes mellitus with other skin ulcer: Secondary | ICD-10-CM | POA: Diagnosis present

## 2024-01-30 DIAGNOSIS — I130 Hypertensive heart and chronic kidney disease with heart failure and stage 1 through stage 4 chronic kidney disease, or unspecified chronic kidney disease: Secondary | ICD-10-CM | POA: Insufficient documentation

## 2024-01-30 DIAGNOSIS — I5042 Chronic combined systolic (congestive) and diastolic (congestive) heart failure: Secondary | ICD-10-CM | POA: Diagnosis not present

## 2024-01-30 DIAGNOSIS — L97812 Non-pressure chronic ulcer of other part of right lower leg with fat layer exposed: Secondary | ICD-10-CM | POA: Insufficient documentation

## 2024-01-30 DIAGNOSIS — L97822 Non-pressure chronic ulcer of other part of left lower leg with fat layer exposed: Secondary | ICD-10-CM | POA: Insufficient documentation

## 2024-01-30 DIAGNOSIS — I890 Lymphedema, not elsewhere classified: Secondary | ICD-10-CM | POA: Insufficient documentation

## 2024-01-30 DIAGNOSIS — N183 Chronic kidney disease, stage 3 unspecified: Secondary | ICD-10-CM | POA: Insufficient documentation

## 2024-01-30 DIAGNOSIS — E1122 Type 2 diabetes mellitus with diabetic chronic kidney disease: Secondary | ICD-10-CM | POA: Insufficient documentation

## 2024-02-05 ENCOUNTER — Ambulatory Visit

## 2024-02-06 ENCOUNTER — Encounter: Attending: Physician Assistant | Admitting: Physician Assistant

## 2024-02-06 DIAGNOSIS — I5042 Chronic combined systolic (congestive) and diastolic (congestive) heart failure: Secondary | ICD-10-CM | POA: Insufficient documentation

## 2024-02-06 DIAGNOSIS — L97822 Non-pressure chronic ulcer of other part of left lower leg with fat layer exposed: Secondary | ICD-10-CM | POA: Diagnosis not present

## 2024-02-06 DIAGNOSIS — E11622 Type 2 diabetes mellitus with other skin ulcer: Secondary | ICD-10-CM | POA: Insufficient documentation

## 2024-02-06 DIAGNOSIS — I890 Lymphedema, not elsewhere classified: Secondary | ICD-10-CM | POA: Diagnosis not present

## 2024-02-06 DIAGNOSIS — L97812 Non-pressure chronic ulcer of other part of right lower leg with fat layer exposed: Secondary | ICD-10-CM | POA: Diagnosis not present

## 2024-02-06 DIAGNOSIS — N183 Chronic kidney disease, stage 3 unspecified: Secondary | ICD-10-CM | POA: Insufficient documentation

## 2024-02-06 DIAGNOSIS — I130 Hypertensive heart and chronic kidney disease with heart failure and stage 1 through stage 4 chronic kidney disease, or unspecified chronic kidney disease: Secondary | ICD-10-CM | POA: Insufficient documentation

## 2024-02-06 DIAGNOSIS — E1122 Type 2 diabetes mellitus with diabetic chronic kidney disease: Secondary | ICD-10-CM | POA: Diagnosis not present

## 2024-02-13 ENCOUNTER — Encounter: Admitting: Physician Assistant

## 2024-02-13 DIAGNOSIS — E11622 Type 2 diabetes mellitus with other skin ulcer: Secondary | ICD-10-CM | POA: Diagnosis not present

## 2024-02-20 ENCOUNTER — Encounter: Admitting: Physician Assistant

## 2024-02-26 ENCOUNTER — Ambulatory Visit

## 2024-02-26 ENCOUNTER — Ambulatory Visit: Admitting: Internal Medicine

## 2024-02-26 DIAGNOSIS — E11622 Type 2 diabetes mellitus with other skin ulcer: Secondary | ICD-10-CM | POA: Diagnosis not present

## 2024-02-27 ENCOUNTER — Ambulatory Visit

## 2024-03-05 ENCOUNTER — Encounter: Attending: Physician Assistant | Admitting: Physician Assistant

## 2024-03-05 DIAGNOSIS — E1122 Type 2 diabetes mellitus with diabetic chronic kidney disease: Secondary | ICD-10-CM | POA: Insufficient documentation

## 2024-03-05 DIAGNOSIS — I890 Lymphedema, not elsewhere classified: Secondary | ICD-10-CM | POA: Diagnosis not present

## 2024-03-05 DIAGNOSIS — I130 Hypertensive heart and chronic kidney disease with heart failure and stage 1 through stage 4 chronic kidney disease, or unspecified chronic kidney disease: Secondary | ICD-10-CM | POA: Insufficient documentation

## 2024-03-05 DIAGNOSIS — N183 Chronic kidney disease, stage 3 unspecified: Secondary | ICD-10-CM | POA: Diagnosis not present

## 2024-03-05 DIAGNOSIS — E11622 Type 2 diabetes mellitus with other skin ulcer: Secondary | ICD-10-CM | POA: Insufficient documentation

## 2024-03-05 DIAGNOSIS — I5042 Chronic combined systolic (congestive) and diastolic (congestive) heart failure: Secondary | ICD-10-CM | POA: Insufficient documentation

## 2024-03-05 DIAGNOSIS — L97822 Non-pressure chronic ulcer of other part of left lower leg with fat layer exposed: Secondary | ICD-10-CM | POA: Diagnosis not present

## 2024-03-05 DIAGNOSIS — L97812 Non-pressure chronic ulcer of other part of right lower leg with fat layer exposed: Secondary | ICD-10-CM | POA: Diagnosis not present

## 2024-03-12 ENCOUNTER — Ambulatory Visit: Admitting: Physician Assistant

## 2024-07-13 ENCOUNTER — Ambulatory Visit (INDEPENDENT_AMBULATORY_CARE_PROVIDER_SITE_OTHER): Payer: Medicare Other

## 2024-07-13 ENCOUNTER — Encounter (INDEPENDENT_AMBULATORY_CARE_PROVIDER_SITE_OTHER): Payer: Self-pay | Admitting: Vascular Surgery

## 2024-07-13 ENCOUNTER — Ambulatory Visit (INDEPENDENT_AMBULATORY_CARE_PROVIDER_SITE_OTHER): Payer: TRICARE For Life (TFL) | Admitting: Vascular Surgery

## 2024-07-13 VITALS — BP 139/83 | HR 60 | Resp 18 | Ht 71.0 in | Wt 252.4 lb

## 2024-07-13 DIAGNOSIS — I6523 Occlusion and stenosis of bilateral carotid arteries: Secondary | ICD-10-CM

## 2024-07-13 DIAGNOSIS — E119 Type 2 diabetes mellitus without complications: Secondary | ICD-10-CM

## 2024-07-13 DIAGNOSIS — R600 Localized edema: Secondary | ICD-10-CM

## 2024-07-13 DIAGNOSIS — E785 Hyperlipidemia, unspecified: Secondary | ICD-10-CM | POA: Diagnosis not present

## 2024-07-13 DIAGNOSIS — I10 Essential (primary) hypertension: Secondary | ICD-10-CM

## 2024-07-13 NOTE — Progress Notes (Unsigned)
 "   MRN : [id number]  [name] is a 84 y.o. (11-30-1940) female who presents with chief complaint of  Chief Complaint  Patient presents with   Follow-up    1 year follow up w/ carotid   .  History of Present Illness: ***  Discussed the use of AI scribe software for clinical note transcription with the patient, who gave verbal consent to proceed.  History of Present Illness     Results   Current Outpatient Medications  Medication Sig Dispense Refill   acetaminophen  (TYLENOL ) 325 MG tablet Take 1,000 mg by mouth 2 (two) times daily.      allopurinol (ZYLOPRIM) 100 MG tablet      atorvastatin  (LIPITOR) 20 MG tablet Take 1 tablet by mouth daily.     Blood Glucose Monitoring Suppl (FIFTY50 GLUCOSE METER 2.0) w/Device KIT Use as directed BAYER CONTOUR NEXT METER DX: E11.9     CONTOUR NEXT TEST test strip      ergocalciferol (VITAMIN D2) 1.25 MG (50000 UT) capsule Take 50,000 Units by mouth once a week.     Fluticasone-Umeclidin-Vilant (TRELEGY ELLIPTA) 100-62.5-25 MCG/INH AEPB Inhale into the lungs as needed.     furosemide (LASIX) 40 MG tablet Take one tablet by mouth once daily as needed for lower extremity edema     gabapentin  (NEURONTIN ) 300 MG capsule Take 300 mg by mouth at bedtime.     hydrocortisone (ANUSOL-HC) 2.5 % rectal cream Place 1 application rectally 2 (two) times daily.     magnesium  oxide (MAG-OX) 400 MG tablet Take 800 mg by mouth daily.      nystatin  cream (MYCOSTATIN ) Apply 1 application topically 2 (two) times daily as needed. Apply thin film to yeast rash under the breasts     Omega-3 Krill Oil 500 MG CAPS Take 500 mg by mouth 3 (three) times a week. On Monday Wednesday and Friday     omeprazole (PRILOSEC) 20 MG capsule Take 20 mg by mouth daily. Acid reflux      sennosides-docusate sodium  (SENOKOT-S) 8.6-50 MG tablet Take 2 tablets by mouth 2 (two) times daily.      TOPROL  XL 25 MG 24 hr tablet Take 25 mg by mouth at bedtime.      triamterene-hydrochlorothiazide (DYAZIDE) 37.5-25 MG capsule Take 1 capsule by mouth daily.     WIXELA INHUB 250-50 MCG/ACT AEPB Inhale 1 puff into the lungs 2 (two) times daily.     solifenacin  (VESICARE ) 5 MG tablet Take 5 mg by mouth daily. (Patient not taking: Reported on 07/13/2024)     No current facility-administered medications for this visit.    Past Medical History:  Diagnosis Date   Acquired hammertoe    other   Acquired hammertoe    Angina pectoris, unspecified    Angina pectoris, unspecified    Arthritis    knees   Breast cancer (HCC) 1990   left breast   Cancer (HCC) 1990,s   left breast   CHF (congestive heart failure) (HCC)    Chronic kidney disease    stage 2   Degenerative joint disease    Dermatophytosis of nail    Diabetes mellitus type 2, uncomplicated (HCC)    diet controlled   Dyspnea    Essential hypertension, benign    Exostosis    unspecified site   Fracture of right ankle 2010   Followed by Dr. Ashley   GERD (gastroesophageal reflux disease)    Hemorrhoids    Hypercholesterolemia  Hyperlipidemia, unspecified    Hypertension    Lymphedema of leg    left leg worse than right leg.   Obesity, unspecified    Osteoarthrosis involving lower leg    unspecified whether generalized or localized    Pre-diabetes    Pure hypercholesterolemia    Vitamin D deficiency, unspecified    Wears dentures    partial upper    Past Surgical History:  Procedure Laterality Date   BREAST LUMPECTOMY Left 1995   BREAST SURGERY Left 1990   lumpectomy   BUNIONECTOMY Bilateral 1990's   plus hammer toe repair on left foot.   CATARACT EXTRACTION W/PHACO Left 08/10/2018   Procedure: CATARACT EXTRACTION PHACO AND INTRAOCULAR LENS PLACEMENT (IOC)  LEFT DIABETIC;  Surgeon: Myrna Adine Anes, MD;  Location: [facility];  Service: Ophthalmology;  Laterality: Left;  diabetic - diet controlled   CATARACT EXTRACTION W/PHACO Right 11/16/2018   Procedure: CATARACT  EXTRACTION PHACO AND INTRAOCULAR LENS PLACEMENT (IOC)  RIGHT DIABETIC;  Surgeon: Myrna Adine Anes, MD;  Location: [facility];  Service: Ophthalmology;  Laterality: Right;  Diabetic - diet controlled   COLONOSCOPY     COLONOSCOPY N/A 11/01/2021   Procedure: COLONOSCOPY;  Surgeon: Onita Elspeth Sharper, DO;  Location: [facility] ENDOSCOPY;  Service: Gastroenterology;  Laterality: N/A;  DIET CONTROLLED   COLONOSCOPY N/A 09/16/2022   Procedure: COLONOSCOPY;  Surgeon: Onita Elspeth Sharper, DO;  Location: [facility] ENDOSCOPY;  Service: Gastroenterology;  Laterality: N/A;   COLONOSCOPY WITH PROPOFOL  N/A 05/08/2018   Procedure: COLONOSCOPY WITH PROPOFOL ;  Surgeon: Gaylyn Gladis PENNER, MD;  Location: [facility] ENDOSCOPY;  Service: Endoscopy;  Laterality: N/A;   ENDOMETRIAL BIOPSY     ESOPHAGOGASTRODUODENOSCOPY N/A 11/27/2023   Procedure: EGD (ESOPHAGOGASTRODUODENOSCOPY);  Surgeon: Onita Elspeth Sharper, DO;  Location: [facility] ENDOSCOPY;  Service: Gastroenterology;  Laterality: N/A;   EYE SURGERY     FOOT SURGERY Left    for bunionectomy and hammertoe   JOINT REPLACEMENT     TONSILLECTOMY     TOTAL KNEE ARTHROPLASTY Left 04/08/2017   Procedure: TOTAL KNEE ARTHROPLASTY;  Surgeon: Edie Norleen PARAS, MD;  Location: [facility] ORS;  Service: Orthopedics;  Laterality: Left;   TOTAL KNEE ARTHROPLASTY Right 08/19/2017   Procedure: TOTAL KNEE ARTHROPLASTY;  Surgeon: Edie Norleen PARAS, MD;  Location: [facility] ORS;  Service: Orthopedics;  Laterality: Right;     Social History[1]     Family History  Problem Relation Age of Onset   Stroke Mother    Hypertension Mother    Cancer Father    Prostate cancer Brother    Breast cancer Maternal Aunt 57   Breast cancer Cousin        mat cousin   Kidney cancer Neg Hx    Kidney disease Neg Hx      Allergies[2]   REVIEW OF SYSTEMS (Negative unless checked)  Constitutional: [] Weight loss  [] Fever  [] Chills Cardiac: [] Chest pain   [] Chest pressure   [] Palpitations   [] Shortness of  breath when laying flat   [] Shortness of breath at rest   [] Shortness of breath with exertion. Vascular:  [] Pain in legs with walking   [] Pain in legs at rest   [] Pain in legs when laying flat   [] Claudication   [] Pain in feet when walking  [] Pain in feet at rest  [] Pain in feet when laying flat   [] History of DVT   [] Phlebitis   [x] Swelling in legs   [] Varicose veins   [] Non-healing ulcers Pulmonary:   [] Uses home oxygen   []   Productive cough   [] Hemoptysis   [] Wheeze  [] COPD   [] Asthma Neurologic:  [] Dizziness  [] Blackouts   [] Seizures   [] History of stroke   [] History of TIA  [] Aphasia   [] Temporary blindness   [] Dysphagia   [] Weakness or numbness in arms   [] Weakness or numbness in legs Musculoskeletal:  [x] Arthritis   [] Joint swelling   [] Joint pain   [] Low back pain Hematologic:  [] Easy bruising  [] Easy bleeding   [] Hypercoagulable state   [] Anemic  [] Hepatitis Gastrointestinal:  [] Blood in stool   [] Vomiting blood  [x] Gastroesophageal reflux/heartburn   [] Difficulty swallowing. Genitourinary:  [] Chronic kidney disease   [] Difficult urination  [] Frequent urination  [] Burning with urination   [] Blood in urine Skin:  [] Rashes   [] Ulcers   [] Wounds Psychological:  [] History of anxiety   []  History of major depression.  Physical Examination  Vitals:   07/13/24 1337  BP: 139/83  Pulse: 60  Resp: 18  Weight: 252 lb 6.4 oz (114.5 kg)  Height: 5' 11 (1.803 m)   Body mass index is 35.2 kg/m. Gen:  WD/WN, NAD Head: [place]/AT, No temporalis wasting. Ear/Nose/Throat: Hearing grossly intact, nares w/o erythema or drainage, trachea midline Eyes: Conjunctiva clear. Sclera non-icteric Neck: Supple.  *** bruit  Pulmonary:  Good air movement, equal and clear to auscultation bilaterally.  Cardiac: RRR, No JVD Vascular:  Vessel Right Left  Radial Palpable Palpable       Musculoskeletal: M/S 5/5 throughout.  No deformity or atrophy. *** edema. Neurologic: CN 2-12 intact. Sensation grossly intact in  extremities.  Symmetrical.  Speech is fluent. Motor exam as listed above. Psychiatric: Judgment intact, Mood & affect appropriate for pt's clinical situation. Dermatologic: No rashes or ulcers noted.  No cellulitis or open wounds. Lymph : No Cervical, Axillary, or Inguinal lymphadenopathy.  Physical Exam    CBC Lab Results  Component Value Date   WBC 5.2 11/18/2023   HGB 13.3 11/18/2023   HCT 40.7 11/18/2023   MCV 93.1 11/18/2023   PLT 156 11/18/2023    BMET    Component Value Date/Time   NA 138 11/18/2023 1015   K 4.1 11/18/2023 1015   CL 107 11/18/2023 1015   CO2 26 11/18/2023 1015   GLUCOSE 122 (H) 11/18/2023 1015   BUN 17 11/18/2023 1015   CREATININE 1.00 11/18/2023 1015   CALCIUM  9.6 11/18/2023 1015   GFRNONAA 56 (L) 11/18/2023 1015   GFRAA >60 08/22/2017 0601   CrCl cannot be calculated (Patient's most recent lab result is older than the maximum 21 days allowed.).  COAG Lab Results  Component Value Date   INR 1.00 08/08/2017   INR 1.01 03/12/2017    Radiology No results found.   Assessment/Plan No problem-specific Assessment & Plan notes found for this encounter.   Assessment and Plan Assessment & Plan    Essential hypertension blood pressure control important in reducing the progression of atherosclerotic disease. On appropriate oral medications.     Type 2 diabetes, diet controlled (HCC) blood glucose control important in reducing the progression of atherosclerotic disease. Also, involved in wound healing.      Hyperlipidemia lipid control important in reducing the progression of atherosclerotic disease. Continue statin therapy   Selinda Gu, MD  07/13/2024 2:42 PM    This note was created with Dragon medical transcription system.  Any errors from dictation are purely unintentional     [1]  Social History Tobacco Use   Smoking status: Never   Smokeless tobacco: Never  Vaping  Use   Vaping status: Never Used  Substance Use Topics    Alcohol use: Yes    Comment: 1-2 drinks every 1-2 months   Drug use: No  [2]  Allergies Allergen Reactions   Colesevelam Other (See Comments)    constipation   Oxybutynin Swelling    Leg swelling   Rosuvastatin Other (See Comments)    Pain in lower extremities   Zoster Vaccine Recombinant, Adjuvanted Hives   "

## 2024-07-14 ENCOUNTER — Ambulatory Visit (INDEPENDENT_AMBULATORY_CARE_PROVIDER_SITE_OTHER)

## 2024-07-14 ENCOUNTER — Other Ambulatory Visit (INDEPENDENT_AMBULATORY_CARE_PROVIDER_SITE_OTHER): Payer: Self-pay | Admitting: Vascular Surgery

## 2024-07-14 DIAGNOSIS — M7989 Other specified soft tissue disorders: Secondary | ICD-10-CM

## 2024-07-15 NOTE — Assessment & Plan Note (Signed)
 Right ICA stable at 1-39%, left ICA with slight progression at 40-59%.  No role for intervention. Continue to follow on an annual basis. No medications changes.

## 2024-07-15 NOTE — Assessment & Plan Note (Signed)

## 2024-07-16 ENCOUNTER — Ambulatory Visit (INDEPENDENT_AMBULATORY_CARE_PROVIDER_SITE_OTHER): Admitting: Vascular Surgery

## 2024-07-23 ENCOUNTER — Ambulatory Visit (INDEPENDENT_AMBULATORY_CARE_PROVIDER_SITE_OTHER): Admitting: Vascular Surgery

## 2024-08-04 ENCOUNTER — Telehealth (INDEPENDENT_AMBULATORY_CARE_PROVIDER_SITE_OTHER): Payer: Self-pay | Admitting: Vascular Surgery

## 2024-08-04 NOTE — Telephone Encounter (Signed)
 Patient called [facility] returned call to reschedule Jd's appointment to get her US  results. Patient has been rescheduled 2 times now due to JD not being available to see her. Offered patient to see the NP. Patient very upset that next available with NP was 08/20/24. Stated she doesn't think we want her as our patient. Also said that she will see FB and ask for her to refer her to another office.

## 2024-08-10 ENCOUNTER — Ambulatory Visit (INDEPENDENT_AMBULATORY_CARE_PROVIDER_SITE_OTHER): Admitting: Vascular Surgery

## 2024-08-20 ENCOUNTER — Ambulatory Visit (INDEPENDENT_AMBULATORY_CARE_PROVIDER_SITE_OTHER): Admitting: Nurse Practitioner

## 2025-07-12 ENCOUNTER — Encounter (INDEPENDENT_AMBULATORY_CARE_PROVIDER_SITE_OTHER)

## 2025-07-12 ENCOUNTER — Ambulatory Visit (INDEPENDENT_AMBULATORY_CARE_PROVIDER_SITE_OTHER): Admitting: Vascular Surgery
